# Patient Record
Sex: Female | Born: 1955
Health system: Southern US, Community
[De-identification: ages and names within clinical notes are randomized; demographics above are authoritative.]

## PROBLEM LIST (undated history)

## (undated) DIAGNOSIS — K5790 Diverticulosis of intestine, part unspecified, without perforation or abscess without bleeding: Secondary | ICD-10-CM

## (undated) DIAGNOSIS — G709 Myoneural disorder, unspecified: Secondary | ICD-10-CM

## (undated) DIAGNOSIS — N189 Chronic kidney disease, unspecified: Secondary | ICD-10-CM

## (undated) DIAGNOSIS — M1990 Unspecified osteoarthritis, unspecified site: Secondary | ICD-10-CM

## (undated) DIAGNOSIS — K5730 Diverticulosis of large intestine without perforation or abscess without bleeding: Secondary | ICD-10-CM

## (undated) DIAGNOSIS — G35 Multiple sclerosis: Secondary | ICD-10-CM

## (undated) DIAGNOSIS — D649 Anemia, unspecified: Secondary | ICD-10-CM

## (undated) DIAGNOSIS — N319 Neuromuscular dysfunction of bladder, unspecified: Secondary | ICD-10-CM

## (undated) DIAGNOSIS — I10 Essential (primary) hypertension: Secondary | ICD-10-CM

## (undated) DIAGNOSIS — N390 Urinary tract infection, site not specified: Secondary | ICD-10-CM

## (undated) DIAGNOSIS — I8390 Asymptomatic varicose veins of unspecified lower extremity: Secondary | ICD-10-CM

## (undated) DIAGNOSIS — K921 Melena: Secondary | ICD-10-CM

## (undated) DIAGNOSIS — Z87442 Personal history of urinary calculi: Secondary | ICD-10-CM

## (undated) DIAGNOSIS — K219 Gastro-esophageal reflux disease without esophagitis: Secondary | ICD-10-CM

## (undated) DIAGNOSIS — K5792 Diverticulitis of intestine, part unspecified, without perforation or abscess without bleeding: Secondary | ICD-10-CM

## (undated) DIAGNOSIS — I8393 Asymptomatic varicose veins of bilateral lower extremities: Secondary | ICD-10-CM

## (undated) DIAGNOSIS — F329 Major depressive disorder, single episode, unspecified: Secondary | ICD-10-CM

## (undated) DIAGNOSIS — K319 Disease of stomach and duodenum, unspecified: Secondary | ICD-10-CM

## (undated) DIAGNOSIS — J310 Chronic rhinitis: Secondary | ICD-10-CM

## (undated) DIAGNOSIS — I809 Phlebitis and thrombophlebitis of unspecified site: Secondary | ICD-10-CM

## (undated) DIAGNOSIS — N3941 Urge incontinence: Secondary | ICD-10-CM

## (undated) DIAGNOSIS — Z973 Presence of spectacles and contact lenses: Secondary | ICD-10-CM

## (undated) DIAGNOSIS — F39 Unspecified mood [affective] disorder: Secondary | ICD-10-CM

## (undated) HISTORY — DX: Chronic rhinitis: J31.0

## (undated) HISTORY — DX: Diverticulitis of intestine, part unspecified, without perforation or abscess without bleeding: K57.92

## (undated) HISTORY — DX: Asymptomatic varicose veins of unspecified lower extremity: I83.90

## (undated) HISTORY — DX: Phlebitis and thrombophlebitis of unspecified site: I80.9

## (undated) HISTORY — DX: Disease of stomach and duodenum, unspecified: K31.9

## (undated) HISTORY — DX: Unspecified mood (affective) disorder: F39

## (undated) HISTORY — DX: Melena: K92.1

## (undated) HISTORY — DX: Diverticulosis of intestine, part unspecified, without perforation or abscess without bleeding: K57.90

## (undated) HISTORY — DX: Anemia, unspecified: D64.9

## (undated) HISTORY — PX: TONSILLECTOMY AND ADENOIDECTOMY: SUR1326

## (undated) HISTORY — DX: Major depressive disorder, single episode, unspecified: F32.9

## (undated) HISTORY — PX: CATARACT EXTRACTION: SUR2

## (undated) HISTORY — DX: Multiple sclerosis: G35

## (undated) HISTORY — DX: Gastro-esophageal reflux disease without esophagitis: K21.9

---
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
## 1999-07-18 ENCOUNTER — Other Ambulatory Visit: Admission: RE | Admit: 1999-07-18 | Discharge: 1999-07-18 | Payer: Self-pay | Admitting: *Deleted

## 2002-01-26 ENCOUNTER — Encounter: Payer: Self-pay | Admitting: Family Medicine

## 2002-01-26 ENCOUNTER — Ambulatory Visit (HOSPITAL_COMMUNITY): Admission: RE | Admit: 2002-01-26 | Discharge: 2002-01-26 | Payer: Self-pay | Admitting: Family Medicine

## 2003-09-07 ENCOUNTER — Encounter: Payer: Self-pay | Admitting: Internal Medicine

## 2004-01-23 ENCOUNTER — Ambulatory Visit (HOSPITAL_COMMUNITY): Admission: RE | Admit: 2004-01-23 | Discharge: 2004-01-23 | Payer: Self-pay | Admitting: Obstetrics and Gynecology

## 2004-11-04 ENCOUNTER — Ambulatory Visit: Payer: Self-pay | Admitting: Internal Medicine

## 2004-12-16 ENCOUNTER — Ambulatory Visit: Payer: Self-pay | Admitting: Internal Medicine

## 2005-01-10 ENCOUNTER — Ambulatory Visit: Payer: Self-pay | Admitting: Internal Medicine

## 2005-01-16 ENCOUNTER — Other Ambulatory Visit: Admission: RE | Admit: 2005-01-16 | Discharge: 2005-01-16 | Payer: Self-pay | Admitting: Internal Medicine

## 2005-01-16 ENCOUNTER — Ambulatory Visit: Payer: Self-pay | Admitting: Internal Medicine

## 2005-01-27 ENCOUNTER — Ambulatory Visit (HOSPITAL_COMMUNITY): Admission: RE | Admit: 2005-01-27 | Discharge: 2005-01-27 | Payer: Self-pay | Admitting: Obstetrics and Gynecology

## 2005-02-03 ENCOUNTER — Ambulatory Visit: Payer: Self-pay | Admitting: Internal Medicine

## 2005-02-17 ENCOUNTER — Ambulatory Visit: Payer: Self-pay | Admitting: Internal Medicine

## 2005-02-17 ENCOUNTER — Encounter (INDEPENDENT_AMBULATORY_CARE_PROVIDER_SITE_OTHER): Payer: Self-pay | Admitting: *Deleted

## 2005-04-04 ENCOUNTER — Ambulatory Visit: Payer: Self-pay | Admitting: Internal Medicine

## 2005-07-28 DIAGNOSIS — G35 Multiple sclerosis: Secondary | ICD-10-CM

## 2005-07-28 HISTORY — DX: Multiple sclerosis: G35

## 2005-09-18 ENCOUNTER — Ambulatory Visit: Payer: Self-pay | Admitting: Internal Medicine

## 2006-02-04 ENCOUNTER — Ambulatory Visit: Payer: Self-pay | Admitting: Internal Medicine

## 2006-03-05 ENCOUNTER — Ambulatory Visit: Payer: Self-pay | Admitting: Internal Medicine

## 2006-04-28 ENCOUNTER — Encounter: Admission: RE | Admit: 2006-04-28 | Discharge: 2006-04-28 | Payer: Self-pay | Admitting: Obstetrics and Gynecology

## 2006-05-28 ENCOUNTER — Ambulatory Visit: Payer: Self-pay | Admitting: Internal Medicine

## 2006-07-16 ENCOUNTER — Encounter: Payer: Self-pay | Admitting: Internal Medicine

## 2006-07-28 HISTORY — PX: ABDOMINAL HYSTERECTOMY: SUR658

## 2006-07-28 HISTORY — PX: BLADDER SURGERY: SHX569

## 2006-08-07 ENCOUNTER — Ambulatory Visit: Payer: Self-pay | Admitting: Internal Medicine

## 2006-08-14 ENCOUNTER — Ambulatory Visit (HOSPITAL_COMMUNITY): Admission: RE | Admit: 2006-08-14 | Discharge: 2006-08-14 | Payer: Self-pay | Admitting: Neurology

## 2006-08-25 ENCOUNTER — Encounter: Payer: Self-pay | Admitting: Internal Medicine

## 2006-12-01 ENCOUNTER — Encounter (INDEPENDENT_AMBULATORY_CARE_PROVIDER_SITE_OTHER): Payer: Self-pay | Admitting: Specialist

## 2006-12-01 HISTORY — PX: TOTAL VAGINAL HYSTERECTOMY: SHX2548

## 2006-12-02 ENCOUNTER — Inpatient Hospital Stay (HOSPITAL_COMMUNITY): Admission: RE | Admit: 2006-12-02 | Discharge: 2006-12-03 | Payer: Self-pay | Admitting: Obstetrics and Gynecology

## 2007-02-24 ENCOUNTER — Telehealth: Payer: Self-pay | Admitting: Internal Medicine

## 2007-02-26 DIAGNOSIS — N3000 Acute cystitis without hematuria: Secondary | ICD-10-CM | POA: Insufficient documentation

## 2007-03-11 ENCOUNTER — Ambulatory Visit: Payer: Self-pay | Admitting: Family Medicine

## 2007-03-11 ENCOUNTER — Telehealth: Payer: Self-pay | Admitting: Internal Medicine

## 2007-03-16 ENCOUNTER — Telehealth: Payer: Self-pay | Admitting: Internal Medicine

## 2007-04-01 ENCOUNTER — Encounter: Payer: Self-pay | Admitting: Internal Medicine

## 2007-04-30 ENCOUNTER — Ambulatory Visit: Payer: Self-pay | Admitting: Internal Medicine

## 2007-04-30 DIAGNOSIS — G35 Multiple sclerosis: Secondary | ICD-10-CM | POA: Insufficient documentation

## 2007-04-30 DIAGNOSIS — F4321 Adjustment disorder with depressed mood: Secondary | ICD-10-CM | POA: Insufficient documentation

## 2007-04-30 DIAGNOSIS — J309 Allergic rhinitis, unspecified: Secondary | ICD-10-CM | POA: Insufficient documentation

## 2007-04-30 DIAGNOSIS — I10 Essential (primary) hypertension: Secondary | ICD-10-CM | POA: Insufficient documentation

## 2007-04-30 DIAGNOSIS — I8310 Varicose veins of unspecified lower extremity with inflammation: Secondary | ICD-10-CM | POA: Insufficient documentation

## 2007-05-31 ENCOUNTER — Encounter: Payer: Self-pay | Admitting: Internal Medicine

## 2007-07-05 ENCOUNTER — Ambulatory Visit: Payer: Self-pay | Admitting: Internal Medicine

## 2007-07-05 DIAGNOSIS — J069 Acute upper respiratory infection, unspecified: Secondary | ICD-10-CM | POA: Insufficient documentation

## 2007-07-05 DIAGNOSIS — J019 Acute sinusitis, unspecified: Secondary | ICD-10-CM | POA: Insufficient documentation

## 2007-08-11 ENCOUNTER — Encounter: Payer: Self-pay | Admitting: Internal Medicine

## 2007-09-01 ENCOUNTER — Telehealth: Payer: Self-pay | Admitting: Internal Medicine

## 2007-09-28 ENCOUNTER — Encounter: Payer: Self-pay | Admitting: Internal Medicine

## 2007-09-29 ENCOUNTER — Telehealth: Payer: Self-pay | Admitting: Internal Medicine

## 2007-11-04 ENCOUNTER — Encounter: Payer: Self-pay | Admitting: Internal Medicine

## 2007-11-11 ENCOUNTER — Encounter
Admission: RE | Admit: 2007-11-11 | Discharge: 2008-02-09 | Payer: Self-pay | Admitting: Physical Medicine & Rehabilitation

## 2007-12-03 ENCOUNTER — Ambulatory Visit: Payer: Self-pay | Admitting: Physical Medicine & Rehabilitation

## 2007-12-27 ENCOUNTER — Encounter: Admission: RE | Admit: 2007-12-27 | Discharge: 2007-12-27 | Payer: Self-pay | Admitting: Anesthesiology

## 2007-12-31 ENCOUNTER — Ambulatory Visit: Payer: Self-pay | Admitting: Physical Medicine & Rehabilitation

## 2008-01-25 ENCOUNTER — Encounter: Payer: Self-pay | Admitting: Internal Medicine

## 2008-01-25 ENCOUNTER — Telehealth: Payer: Self-pay | Admitting: *Deleted

## 2008-02-23 ENCOUNTER — Encounter
Admission: RE | Admit: 2008-02-23 | Discharge: 2008-02-24 | Payer: Self-pay | Admitting: Physical Medicine & Rehabilitation

## 2008-02-24 ENCOUNTER — Ambulatory Visit: Payer: Self-pay | Admitting: Physical Medicine & Rehabilitation

## 2008-04-11 ENCOUNTER — Encounter: Payer: Self-pay | Admitting: Internal Medicine

## 2008-07-03 ENCOUNTER — Telehealth: Payer: Self-pay | Admitting: *Deleted

## 2008-08-02 ENCOUNTER — Telehealth: Payer: Self-pay | Admitting: *Deleted

## 2008-08-22 ENCOUNTER — Ambulatory Visit: Payer: Self-pay | Admitting: Internal Medicine

## 2008-08-22 DIAGNOSIS — M79609 Pain in unspecified limb: Secondary | ICD-10-CM | POA: Insufficient documentation

## 2008-09-19 ENCOUNTER — Telehealth: Payer: Self-pay | Admitting: *Deleted

## 2008-09-22 ENCOUNTER — Ambulatory Visit: Payer: Self-pay | Admitting: Family Medicine

## 2008-10-06 ENCOUNTER — Encounter: Payer: Self-pay | Admitting: Internal Medicine

## 2008-10-06 LAB — CONVERTED CEMR LAB: Vit D, 25-Hydroxy: 19 ng/mL

## 2008-10-09 ENCOUNTER — Encounter: Payer: Self-pay | Admitting: Internal Medicine

## 2008-10-09 ENCOUNTER — Telehealth: Payer: Self-pay | Admitting: Internal Medicine

## 2008-11-02 ENCOUNTER — Telehealth: Payer: Self-pay | Admitting: Internal Medicine

## 2008-11-02 ENCOUNTER — Encounter: Payer: Self-pay | Admitting: Internal Medicine

## 2008-11-13 ENCOUNTER — Telehealth: Payer: Self-pay | Admitting: *Deleted

## 2008-11-15 ENCOUNTER — Telehealth: Payer: Self-pay | Admitting: *Deleted

## 2008-11-30 ENCOUNTER — Emergency Department (HOSPITAL_COMMUNITY): Admission: EM | Admit: 2008-11-30 | Discharge: 2008-11-30 | Payer: Self-pay | Admitting: Emergency Medicine

## 2008-11-30 ENCOUNTER — Encounter: Payer: Self-pay | Admitting: *Deleted

## 2008-11-30 LAB — CONVERTED CEMR LAB
Hemoglobin: 12.4 g/dL
Platelets: 235 10*3/uL
WBC: 9.5 10*3/uL

## 2008-12-13 ENCOUNTER — Ambulatory Visit: Payer: Self-pay | Admitting: Internal Medicine

## 2008-12-13 ENCOUNTER — Encounter: Payer: Self-pay | Admitting: Family Medicine

## 2008-12-13 DIAGNOSIS — Z8639 Personal history of other endocrine, nutritional and metabolic disease: Secondary | ICD-10-CM

## 2008-12-13 DIAGNOSIS — R42 Dizziness and giddiness: Secondary | ICD-10-CM | POA: Insufficient documentation

## 2008-12-13 DIAGNOSIS — E559 Vitamin D deficiency, unspecified: Secondary | ICD-10-CM | POA: Insufficient documentation

## 2008-12-13 DIAGNOSIS — T50995A Adverse effect of other drugs, medicaments and biological substances, initial encounter: Secondary | ICD-10-CM | POA: Insufficient documentation

## 2008-12-13 DIAGNOSIS — K5900 Constipation, unspecified: Secondary | ICD-10-CM | POA: Insufficient documentation

## 2008-12-13 DIAGNOSIS — M62838 Other muscle spasm: Secondary | ICD-10-CM | POA: Insufficient documentation

## 2008-12-13 DIAGNOSIS — R110 Nausea: Secondary | ICD-10-CM | POA: Insufficient documentation

## 2008-12-13 DIAGNOSIS — Z862 Personal history of diseases of the blood and blood-forming organs and certain disorders involving the immune mechanism: Secondary | ICD-10-CM | POA: Insufficient documentation

## 2008-12-13 LAB — CONVERTED CEMR LAB
Alkaline Phosphatase: 71 units/L (ref 39–117)
Bilirubin, Direct: 0.2 mg/dL (ref 0.0–0.3)
CO2: 32 meq/L (ref 19–32)
Calcium: 9.4 mg/dL (ref 8.4–10.5)
GFR calc non Af Amer: 110.46 mL/min (ref >60)
Sodium: 144 meq/L (ref 135–145)
Total Bilirubin: 0.6 mg/dL (ref 0.3–1.2)

## 2008-12-15 ENCOUNTER — Encounter: Payer: Self-pay | Admitting: *Deleted

## 2008-12-18 ENCOUNTER — Telehealth: Payer: Self-pay | Admitting: *Deleted

## 2008-12-20 ENCOUNTER — Telehealth: Payer: Self-pay | Admitting: Internal Medicine

## 2009-01-30 ENCOUNTER — Encounter: Payer: Self-pay | Admitting: Internal Medicine

## 2009-02-06 ENCOUNTER — Encounter: Payer: Self-pay | Admitting: *Deleted

## 2009-03-13 ENCOUNTER — Ambulatory Visit: Payer: Self-pay | Admitting: Internal Medicine

## 2009-03-13 DIAGNOSIS — N390 Urinary tract infection, site not specified: Secondary | ICD-10-CM | POA: Insufficient documentation

## 2009-03-13 DIAGNOSIS — R1084 Generalized abdominal pain: Secondary | ICD-10-CM | POA: Insufficient documentation

## 2009-03-13 LAB — CONVERTED CEMR LAB
Glucose, Urine, Semiquant: NEGATIVE
Nitrite: NEGATIVE
Specific Gravity, Urine: <1.005
pH: 6.5

## 2009-03-14 ENCOUNTER — Encounter: Payer: Self-pay | Admitting: Internal Medicine

## 2009-08-20 ENCOUNTER — Encounter: Payer: Self-pay | Admitting: Internal Medicine

## 2010-05-07 ENCOUNTER — Encounter: Payer: Self-pay | Admitting: Internal Medicine

## 2010-05-08 ENCOUNTER — Encounter: Payer: Self-pay | Admitting: *Deleted

## 2010-08-17 ENCOUNTER — Encounter: Payer: Self-pay | Admitting: Obstetrics and Gynecology

## 2010-08-17 ENCOUNTER — Encounter: Payer: Self-pay | Admitting: Internal Medicine

## 2010-08-27 NOTE — Miscellaneous (Signed)
Summary: Flu shot/[facility]  Flu shot/[facility]   Imported By: Maryln Gottron 05/14/2010 13:05:53  _____________________________________________________________________  External Attachment:    Type:   Image     Comment:   External Document

## 2010-08-27 NOTE — Miscellaneous (Signed)
Summary: Flu Vaccination/[facility]  Flu Vaccination/[facility]   Imported By: Maryln Gottron 08/29/2009 11:24:14  _____________________________________________________________________  External Attachment:    Type:   Image     Comment:   External Document

## 2010-08-27 NOTE — Miscellaneous (Signed)
Summary: Immunization Entry   Immunization History:  Influenza Immunization History:    Influenza:  fluvax 3+ (05/07/2010) 

## 2010-11-05 LAB — BASIC METABOLIC PANEL
CO2: 21 mEq/L (ref 19–32)
Chloride: 104 mEq/L (ref 96–112)
Creatinine, Ser: 0.56 mg/dL (ref 0.4–1.2)
GFR calc Af Amer: >60 mL/min (ref >60)
Glucose, Bld: 166 mg/dL — ABNORMAL HIGH (ref 70–99)

## 2010-11-05 LAB — CBC
MCHC: 36.2 g/dL — ABNORMAL HIGH (ref 30.0–36.0)
MCV: 87.9 fL (ref 78.0–100.0)
RBC: 3.92 MIL/uL (ref 3.87–5.11)
RDW: 12.9 % (ref 11.5–15.5)

## 2010-12-10 NOTE — Group Therapy Note (Signed)
REASON FOR CONSULTATION:  A consultation is requested for the evaluation  of pain, in the setting of multiple sclerosis.   HISTORY:  This 55 year old female who states she was diagnosed with  multiple sclerosis approximately 1-1/2 years ago, initially in a  relapsing remitting pattern, then later to a secondary progressive  pattern.  She thinks she may have had some symptomatology even as far  back as ten years ago.  In terms of her functional status, she is  independent without self-care and mobility. 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  She remains quite active.  She works at the youth camp four days a week.  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  She walks with a cane and has been using a cane for the last  year or so.   In terms of her pain complaints, she has several areas.  The most  intense are over her hip areas bilaterally.  This wakes her up at night.  She has a calf tightness as well and this was partially relieved with  Baclofen.  She also complains of back pain which is intermittent.  The  neck pain is constant but not severe.  She states that her pain averages  in the 4/10 range and currently 2/10.  It interferes with activity at a  1/10 level.  The pain is worse during the evening hours, and this more  specifically relates to her hip pain.  Her sleep is poor because of  this.  Her pain is worse with activity and improved with rest, heat,  therapy and medications.  She has just recently gone through physical  therapy and has learned some stretching exercises, which have been quite  helpful.  Her neck and back pain seem to be relieved with flexion-type  exercise.   REVIEW OF SYSTEMS:  Significant for numbness, tremor, tingling in the  legs, greater than the arms, trouble walking, spasms, depression and  anxiety.  She has a goal of coming off some of her  antidepressant  medications.  In regards to her MS, she is starting Tysabri in two  weeks.  She has come off the Copaxone.   FAMILY HISTORY:  Mother was initially diagnosed with multiple sclerosis,  but then was diagnosed by Dr. Gilmore Laroche as some other problem,  mimicking multiple sclerosis.   SOCIAL HISTORY:  Married with four kids.  The youngest one is going to  [employer].  She is not a smoker.  She drinks wine about five times a  week, but only a glass or so.   PAST MEDICAL HISTORY:  Also significant hypertension.   CURRENT MEDICATIONS:  1. Zoloft 150 mg daily.  2. Wellbutrin 150 mg daily.  3. Baclofen 10 mg twice daily.  4. Astelin spray.  5. As noted, starting Tysabri on Dec 10, 2007.   ALLERGIES:  CODEINE causes itching.  She has seasonal allergies.   PHYSICAL EXAMINATION:  GENERAL:  A well-developed and well-nourished  female, in no acute distress.  Her mood and affect are appropriate.  NEUROLOGIC/MUSCULOSKELETAL:  Gait:  She does appear to have some  stiffness in her left and right hip and a wide base of support.  Her  deep tendon reflexes 3+ in the bilateral biceps, triceps, brachialis  radialis, 2+ in the knees and ankles.  She has no evidence of clonus at  the  ankles.  Her motor strength is 5/5 in the bilateral upper  extremities and lower extremities.  She has tenderness to palpation over  the bilateral greater trochanters.  Muscle bulk is normal.  She has no  evidence of fasciculations, no evidence of peripheral edema.  She has  normal peripheral pulses in the upper and lower extremities.  Has 75%  range of motion.  Flexion, extension, lateral rotation and bending.  Lhermitte's sign is negative.  Spurling's sign is negative.  She has  pain with extension of her cervical, thoracic and lumbar spine.  She has  tenderness to palpation in the cervical, thoracic and lumbar paraspinals  as well as right upper trapezius.  She has no tenderness over the   fibromyalgia tender points, except over the upper trapezius and low back  areas.  This is not diagnostic, however.   Upper and lower extremity range of motion  are normal.  Joint mobility  is normal.   IMPRESSION:  A 55 year old female with multiple sclerosis, manifesting  as truncal ataxia and some poorly-defined weakness.  She may have some  element of spasticity as well.   1. As I discussed with the patient, her multiple sclerosis may not be      the underlying cause of several of her pain complaints; and in      fact, she likely has some additional causes as listed below.  2. Bilateral hip pain, left greater than right.  This appears to be      related to a trochanteric bursitis.  3. Cervical, thoracic and lumbar spine pain with extension.  This most      likely represents a facet syndrome, with some underlying      spondylosis.   PLAN:  1. In regards to her hip pain, would like to inject her left hip.  She      states she does not have enough time to do that today, and I will      schedule her back for this.  Would anticipate using 40 mg of      Kenalog and 4 mL of 1% lidocaine.  2. In terms of her neck and back pain, I think that stretching is      appropriate.  She may benefit from some stabilization exercises and      if this progresses, may be a candidate for cervical facet      injections.  3. For now, we will start tramadol 50 mg twice daily.  Cautioned      against serotonin syndrome in conjunction with her previous      prescription for Zoloft.  4. Given some calf tightness and what seems to be a decreasing effect      in the use of the Baclofen, will ask her to increase it to three      times daily.   FOLLOWUP:  I will see her back for the hip injection and see how she  does on the tramadol.   Thank you for this interesting consultation.  I will keep you appraised  of her progress.      Erick Colace, M.D.  Electronically Signed     AEK/MedQ  D:   12/03/2007 16:31:51  T:  12/03/2007 17:46:56  Job #:  [id number]   cc:   Harriette Bouillon  Fax: [phone]

## 2010-12-10 NOTE — Assessment & Plan Note (Signed)
A 55 year old female diagnosed with multiple sclerosis approximately 1-  1/2 years ago, initially with a relapsing remitting pattern, later to a  secondary progressive pattern.  She continues to be independent with  self care and mobility. 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  Last visit I  increased her baclofen to t.i.d. and initiated tramadol 50 mg b.i.d.  She has been continuous in physical therapy at [facility], doing  some stretching for the neck and upper back area.   ALLERGIES:  CODEINE, CAUSING ITCHING.   Pain diagram indicates pain over the bilateral hip areas as well as mid  back and neck area.  Average pain is 4/10, currently 2, interfering with  activity at a 1/10 level.  She can walk 30 minutes at a time, climb  steps and drive.   REVIEW OF SYSTEMS:  Positive for bladder and bowel problems related to  MS, depression, anxiety.   PHYSICAL EXAMINATION:  Gait has some wide-based support.  She has no  evidence of toe drag or knee instability.  Deep tendon reflexes 3+,  bilateral biceps, triceps, brachioradialis, 2+ knees and ankles.  No  evidence of clonus at the ankle.  She does have some increased  resistance to passive movement, bilateral quadriceps, modified Ashworth  score of 2.  No evidence of fasciculation.  She has no evidence of  peripheral edema.  She has normal pulses in the lower extremities.  She  has no tenderness over the greater trochanters of the hip.   IMPRESSION:  A 55 year old female with multiple sclerosis, secondary  progressive with truncal ataxia and spasticity, primarily in the lower  extremities.   PLAN:  1. We will increase her baclofen to 10 mg q.i.d.  2. Continue tramadol 50 mg b.i.d.  3. Instructed her in quad stretching exercises.  4. Hold off on hip injection as this appears to be better.   I will see her back in about 2 months.      Erick Colace, M.D.  Electronically Signed     AEK/MedQ  D:  12/31/2007 16:02:57  T:  12/31/2007 18:22:12  Job #:  [id number]   cc:   Dr. Flonnie Hailstone [facility] Department of Neurology

## 2010-12-10 NOTE — Assessment & Plan Note (Signed)
A 55 year old female diagnosed with multiple sclerosis approximately 1-  1/2 years ago, initially with relapsing, remitting, then later secondary  progressive pattern.  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  She has had back pain, neck pain, and some thigh pain.  This has been  averaging 3/10.  She was trailed on tramadol 50 mg twice a day which was  not particularly helpful.  She saw Dr. Epimenio Foot and was started on Lodine  400 mg b.i.d., but she increased it to t.i.d. per her report and this  has been helpful.  She has had no GI upset, but she has concerns of  potential GI upset and thought that the Lodine would be safer than the  ibuprofen that she used to take, but we discussed that it has the same  potential.   Her sleep is fair.  Her pain interferes with activity only a little bit.  Her pain is described as burning, dull, and aching, improves with ice,  stretching, and she has been doing some water aerobic exercise and MS  specific program at the [facility] [place].   Her pain does interfere with meal prep, household duties, and shopping.  She has applied for disability since retiring from work.  She has had  problems with tingling, trouble walking, spasms, dizziness, confusion,  depression, nausea, and constipation.  She has had her third dose of  Tysabri and thinks may be this has helped her.  She was in bed for a  couple of weeks after quitting work.   SOCIAL HISTORY:  Married.   PHYSICAL EXAMINATION:  VITAL SIGNS:  Blood pressure is 141/78, pulse 77,  respirations 22, and O2 sat 97% on room air.  GENERAL:  Well-developed and well-nourished female, orientation x3.  Affect is alert.  NEUROLOGIC:  Gait is wide based.  She is able to toe-walk and heel-walk,  although she has poor balance with heel-walk.  Her Romberg is negative.  Finger-nose-finger testing is intact.  She has no evidence of  clonus,  but has 3+ reflexes bilateral at biceps, triceps, brachioradialis, knee  and ankle.  She has positive Hoffman reflex.  She has no tenderness to  palpation with greater trochanters of the hip, but she has some  tenderness on the lumbosacral junction.  Her motor strength is 5/5  bilateral deltoid, biceps, triceps, grip, as well as hip flexion, knee  extension, and ankle dorsiflexion.   IMPRESSION:  A 55 year old female with multiple sclerosis secondary to  progressive truncal ataxia, spasticity, as well as diffuse pains i.e.  low back, neck, and legs which are likely multifactorial in nature.   PLAN:  1. We will continue baclofen 10 mg t.i.d.  2. Given her concerns regarding peptic ulcer disease.  We will first      trial her on tramadol 100 mg b.i.d., and if this fails to provide      adequate relief, we will trial her on Celebrex 200 mg a day had      given her some samples.  This      is to be taken in place of the Lodine.  3. I will see her back in 2 months or sooner if need be.      [name], M.D.  Electronically Signed     AEK/MedQ  D:  02/24/2008 09:43:40  T:  02/25/2008 02:19:14  Job #:  [id number]   cc:  Trudie Buckler  Fax: [phone]   Despina Arias  Fax: [phone]

## 2010-12-13 NOTE — Op Note (Signed)
NAMEREYANN, TROOP               ACCOUNT NO.:  [id number]   MEDICAL RECORD NO.:  [id number]          PATIENT TYPE:  AMB   LOCATION:  SDC                           FACILITY:  [facility]   PHYSICIAN:  Michelle L. Grewal, M.D.DATE OF BIRTH:  [birth year]   DATE OF PROCEDURE:  12/01/2006  DATE OF DISCHARGE:                               OPERATIVE REPORT   PREOPERATIVE DIAGNOSIS:  Pelvic relaxation.   POSTOPERATIVE DIAGNOSIS:  Pelvic relaxation.   PROCEDURE:  Total vaginal hysterectomy, anterior repair, posterior  repair and sacrospinous ligament fixation of vaginal vault.   SURGEON:  Michelle L. Vincente Poli, M.D.   ASSISTANT:  Dineen Kid. Rana Snare, M.D.   ANESTHESIA:  General.   SPECIMENS:  Uterus and cervix.   ESTIMATED BLOOD LOSS:  200 mL.   COMPLICATIONS:  None.   PROCEDURE:  Patient taken to the operating room after informed consent  was obtained.  She had been counseled and acknowledged the known and  accepted risks of surgery which include risks of anesthesia, risk of  infection, bleeding internal injury to bladder, bowel or other organs  and risk of pulmonary embolism and venous thromboembolism.  The patient  was intubated.  She was prepped and draped.  A Foley catheter was  inserted.  Exam under anesthesia revealed she had grade III uterine  prolapse.  A small cystocele and grade II to III rectocele.  She had  excellent support of her UV angle.  Of note the patient had had a  history of a Burch retropubic urethropexy in 1998.  A weighted speculum  was placed in the vagina.  The cervix was grasped with a tenaculum.  A  circumferential incision was made around the cervix and the posterior  cul-de-sac was entered sharply and the anterior cul-de-sac was entered  sharply.  A weighted speculum placed in the cul-de-sac.  Curved Heaney  clamps were placed and clamped the uterosacral cardinal ligaments on  either side.  The pedicles were cut and suture ligated using 0-0 Vicryl  suture.  We  walked our way up the known broad ligament and each clamp  was just beside the uterus and the pedicle was secured using a free tie  suture ligature of 0-0 Vicryl suture.  Once we reached the level of the  triple pedicle, the uterus was retroflexed.  The remainder of the broad  ligament was clamped on either side using a curved Heaney clamps.  The  specimen was removed.  The triple pedicle was secured using a suture  ligature and a free tie of O Vicryl suture.  The reason inspected and  noted to be normal.  Of note the patient had desired for her ovaries not  to be removed.  They did appear to be normal.  We then proceeded with  our anterior repair by placing Allis clamps on either side and making a  small midline incision in the anterior vaginal vault with Metzenbaum  scissors.  The cystocele was reduced using sharp and blunt dissection.  The cystocele was closed using a pursestring sutures using 0-0 Vicryl  suture.  The redundant vaginal epithelium  was trimmed and the anterior  vaginal vault was closed using 2-0 Vicryl in a continuous running locked  stitch.  At this point we then closed in the part of the vaginal cuff as  well, the anterior two thirds using 2-0 Vicryl.  We then performed a  posterior repair.  Allis clamps were placed at 5 and 7 o'clock.  A V-  shaped incision was made and the midline incision was made all the way  up the posterior vaginal wall.  The rectocele was reduced using sharp  and blunt dissection.  At this point we then identified her sacrospinous  ligament on her right and using the Cassio device a stitch was placed  through the sacral spinous ligament with excellent placement.  The  placement was rechecked.  I then suspended the sacrospinous ligament and  used a stitch using a figure-of-eight stitch to the vaginal apex on the  underside.  We then reduced the rectocele using interrupteds using 0-0  Vicryl suture.  We then started to close the vaginal epithelium  after  redundant tissue was trimmed using 2-0 Vicryl in continuous running  locked stitch.  After about two thirds of the vaginal posterior vaginal  wall was closed, I then tied down the sacrospinous ligament, had  excellent fixation of the vaginal vault and with excellent suspension.  The remainder of the vaginal epithelium was closed.  The perineum was  reapproximated reinforced using 0-0 Vicryl suture and the rest of the  vagina was then closed using subcuticular stitch.  At the end of the  procedure no bleeding was noted.  A vaginal packing with Estrace cream  was placed in the vagina.  All sponge, lap and instrument counts were  correct x2.  Urine was clear.  The patient was extubated and went to  recovery room in stable condition.  After observation she will go  upstairs for her postoperative care.      Michelle L. Vincente Poli, M.D.     Florestine Avers  D:  12/01/2006  T:  12/01/2006  Job:  [id number]

## 2010-12-13 NOTE — Discharge Summary (Signed)
[name], [name]               ACCOUNT NO.:  [id number]   MEDICAL RECORD NO.:  [id number]          PATIENT TYPE:  INP   LOCATION:  9302                          FACILITY:  [facility]   PHYSICIAN:  Michelle L. Grewal, M.D.DATE OF BIRTH:  September 11, 1955   DATE OF ADMISSION:  12/01/2006  DATE OF DISCHARGE:  12/03/2006                               DISCHARGE SUMMARY   ADMISSION DIAGNOSIS:  Pelvic relaxation.   DISCHARGE DIAGNOSIS:  Pelvic relaxation.   HOSPITAL COURSE:  A 55 year old, G4, P4, with pelvic prolapse.  The  patient underwent TVH, anterior repair, and sacrospinous ligament  fixation.  EBL at the time of surgery was 200 ml.  The patient did very  well postop.  By postop day #2, she was ambulating, voiding, tolerating  her pain medicine, and tolerating a regular diet.  She remained afebrile  with stable vital signs throughout her hospitalization.  Her postop day  #1 hemoglobin was 10.5.  The patient was discharged home in good  condition on postop day #2.  She was given ibuprofen 600 mg every 6  hours to take as needed for pain, Tylox 1 to 2 every 4 to 6 as needed  for pain.  She was advised no driving for one week, no sex for six  weeks, no heavy lifting for four to six weeks.  She was advised to call  if she has any nausea or vomiting, severe abdominal pain, heavy vaginal  bleeding, or a temperature greater than 100.5.  She will follow up with  me in one week.      Michelle L. Vincente Poli, M.D.  Electronically Signed     MLG/MEDQ  D:  01/16/2007  T:  01/16/2007  Job:  [id number]

## 2011-01-21 ENCOUNTER — Other Ambulatory Visit: Payer: Self-pay

## 2011-01-24 ENCOUNTER — Other Ambulatory Visit (INDEPENDENT_AMBULATORY_CARE_PROVIDER_SITE_OTHER): Payer: Self-pay

## 2011-01-24 DIAGNOSIS — Z0000 Encounter for general adult medical examination without abnormal findings: Secondary | ICD-10-CM

## 2011-01-24 LAB — TSH: TSH: 0.97 u[IU]/mL (ref 0.35–5.50)

## 2011-01-24 LAB — LIPID PANEL
Cholesterol: 207 mg/dL — ABNORMAL HIGH (ref 0–200)
HDL: 58.3 mg/dL (ref >39.00)
VLDL: 34.8 mg/dL (ref 0.0–40.0)

## 2011-01-24 LAB — BASIC METABOLIC PANEL
BUN: 15 mg/dL (ref 6–23)
Calcium: 9.3 mg/dL (ref 8.4–10.5)
GFR: 121.19 mL/min (ref >60.00)
Glucose, Bld: 80 mg/dL (ref 70–99)
Sodium: 139 mEq/L (ref 135–145)

## 2011-01-24 LAB — CBC WITH DIFFERENTIAL/PLATELET
Basophils Absolute: 0 10*3/uL (ref 0.0–0.1)
Hemoglobin: 12.5 g/dL (ref 12.0–15.0)
Lymphocytes Relative: 42.3 % (ref 12.0–46.0)
Monocytes Relative: 8.4 % (ref 3.0–12.0)
Platelets: 237 10*3/uL (ref 150.0–400.0)
RDW: 13.7 % (ref 11.5–14.6)

## 2011-01-24 LAB — HEPATIC FUNCTION PANEL
Albumin: 4.4 g/dL (ref 3.5–5.2)
Alkaline Phosphatase: 79 U/L (ref 39–117)

## 2011-01-24 LAB — LDL CHOLESTEROL, DIRECT: Direct LDL: 137.1 mg/dL

## 2011-02-04 ENCOUNTER — Encounter: Payer: Self-pay | Admitting: Internal Medicine

## 2011-02-05 ENCOUNTER — Encounter: Payer: Self-pay | Admitting: Internal Medicine

## 2011-02-05 ENCOUNTER — Ambulatory Visit (INDEPENDENT_AMBULATORY_CARE_PROVIDER_SITE_OTHER): Payer: BC Managed Care – PPO | Admitting: Internal Medicine

## 2011-02-05 VITALS — BP 126/82 | HR 84 | Temp 98.5°F | Ht 66.0 in | Wt 175.0 lb

## 2011-02-05 DIAGNOSIS — F4321 Adjustment disorder with depressed mood: Secondary | ICD-10-CM

## 2011-02-05 DIAGNOSIS — I10 Essential (primary) hypertension: Secondary | ICD-10-CM

## 2011-02-05 DIAGNOSIS — E781 Pure hyperglyceridemia: Secondary | ICD-10-CM

## 2011-02-05 DIAGNOSIS — Z0000 Encounter for general adult medical examination without abnormal findings: Secondary | ICD-10-CM

## 2011-02-05 DIAGNOSIS — G35 Multiple sclerosis: Secondary | ICD-10-CM

## 2011-02-05 DIAGNOSIS — Z23 Encounter for immunization: Secondary | ICD-10-CM

## 2011-02-05 LAB — POCT URINALYSIS DIPSTICK
Bilirubin, UA: NEGATIVE
Glucose, UA: NEGATIVE
Ketones, UA: NEGATIVE
Nitrite, UA: NEGATIVE

## 2011-02-05 NOTE — Progress Notes (Signed)
  Subjective:    Patient ID: [name], female    DOB: Jul 28, 1956, 55 y.o.   MRN: [id number]  HPI Patient comes in today for Preventive Health Care visit   And other issues. Since last visit has had no major change in her health but her MS is progressing some. Had nausea and weakness for about 10 weeks  Had trigeminal nerve and tegretol caused sx. Now better.  Limited walking far  . Marland Kitchen   No fall for a while. Not using cane .  Had some sciatica helped by chiro. Also may have some DJD.  Review of Systems ROS:  GEN/ HEENTNo fever, significant weight changes sweats headaches vision problems hearing changes, CV/ PULM; No chest pain shortness of breath cough, syncope,edema  change in exercise tolerance. GI /GU: No adominal pain, vomiting, change in bowel habits. No blood in the stool. Some urge incontinence. From MS SKIN/HEME: ,no acute skin rashes suspicious lesions or bleeding. No lymphadenopathy, nodules, masses.  NEURO/ PSYCH:  Ms with  Some weakness and numbness.  No depression anxiety. No longer on meds  Except low dose wellbutrin  Family .moving  IMM/ Allergy: No unusual infections.  Allergy .   REST of 12 system review negative  Rest as per hpi  Past history family history social history reviewed in the electronic medical record.     Objective:   Physical Exam Physical Exam: Vital signs reviewed WUJ:[id number] is a well-developed well-nourished alert cooperative  white female who appears her stated age in no acute distress.  HEENT: normocephalic  traumatic , Eyes: PERRL EOM's full, conjunctiva clear, Nares: paten,t no deformity discharge or tenderness., Ears: no deformity EAC's clear TMs with normal landmarks. Mouth: clear OP, no lesions, edema.  Moist mucous membranes. Dentition in adequate repair. NECK: supple without masses, thyromegaly or bruits. CHEST/PULM:  Clear to auscultation and percussion breath sounds equal no wheeze , rales or rhonchi. No chest wall deformities or  tenderness. CV: PMI is nondisplaced, S1 S2 no gallops, murmurs, rubs. Peripheral pulses are full without delay.No JVD .  Breast: normal by inspection . No dimpling, discharge, masses, tenderness or discharge . LN: no cervical axillary inguinal adenopathy ABDOMEN: Bowel sounds normal nontender  No guard or rebound, no hepato splenomegal no CVA tenderness.  No hernia. Extremtities:  No clubbing cyanosis or edema, no acute joint swelling or redness no focal atrophy NEURO:  Oriented x3, cranial nerves 3-12 appear to be intact,   dtrs present no clonus  SKIN: No acute rashes normal turgor, color, no bruising or petechiae. PSYCH: Oriented, good eye contact, no obvious depression anxiety, cognition and judgment appear normal. Labs reviewed  EKG no changes sinus  rhythm    Assessment & Plan:  Preventive Health Care Counseled regarding healthy nutrition, exercise, sleep, injury prevention, calcium vit d and healthy weight . tdap today  MS  Some progression but still quit independent  Good days and bad days no falling.  Disc the clonopin with  Neuro about continuation as concern about dependency.  Mood  doing well   Just on Wellbutrin  HT stable Elevated TG.   Counseled.

## 2011-02-05 NOTE — Patient Instructions (Signed)
Avoid animal fats , trans fats simple sugars sweets  and carbohydrates.  Check up in a year or as needed Disc clonipen with your neurologist.  Hypertriglyceridemia  Diet for High blood levels of Triglycerides Most fats in food are triglycerides. Triglycerides in your blood are stored as fat in your body. High levels of triglycerides in your blood may put you at a greater risk for heart disease and stroke.  Normal triglyceride levels are less than 150 mg/dL. Borderline high levels are 150-199 mg/dl. High levels are 200 - 499 mg/dL, and very high triglyceride levels are greater than 500 mg/dL. The decision to treat high triglycerides is generally based on the level. For people with borderline or high triglyceride levels, treatment includes weight loss and exercise. Drugs are recommended for people with very high triglyceride levels. Many people who need treatment for high triglyceride levels have metabolic syndrome. This syndrome is a collection of disorders that often include: insulin resistance, high blood pressure, blood clotting problems, high cholesterol and triglycerides. TESTING PROCEDURE FOR TRIGLYCERIDES  You should not eat 4 hours before getting your triglycerides measured. The normal range of triglycerides is between 10 and 250 milligrams per deciliter (mg/dl). Some people may have extreme levels (1000 or above), but your triglyceride level may be too high if it is above 150 mg/dl, depending on what other risk factors you have for heart disease.   People with high blood triglycerides may also have high blood cholesterol levels. If you have high blood cholesterol as well as high blood triglycerides, your risk for heart disease is probably greater than if you only had high triglycerides. High blood cholesterol is one of the main risk factors for heart disease.  CHANGING YOUR DIET  Your weight can affect your blood triglyceride level. If you are more than 20% above your ideal body weight, you  may be able to lower your blood triglycerides by losing weight. Eating less and exercising regularly is the best way to combat this. Fat provides more calories than any other food. The best way to lose weight is to eat less fat. Only 30% of your total calories should come from fat. Less than 7% of your diet should come from saturated fat. A diet low in fat and saturated fat is the same as a diet to decrease blood cholesterol. By eating a diet lower in fat, you may lose weight, lower your blood cholesterol, and lower your blood triglyceride level.  Eating a diet low in fat, especially saturated fat, may also help you lower your blood triglyceride level. Ask your dietitian to help you figure how much fat you can eat based on the number of calories your caregiver has prescribed for you.  Exercise, in addition to helping with weight loss may also help lower triglyceride levels.   Alcohol can increase blood triglycerides. You may need to stop drinking alcoholic beverages.   Too much carbohydrate in your diet may also increase your blood triglycerides. Some complex carbohydrates are necessary in your diet. These may include bread, rice, potatoes, other starchy vegetables and cereals.   Reduce "simple" carbohydrates. These may include pure sugars, candy, honey, and jelly without losing other nutrients. If you have the kind of high blood triglycerides that is affected by the amount of carbohydrates in your diet, you will need to eat less sugar and less high-sugar foods. Your caregiver can help you with this.   Adding 2-4 grams of fish oil (EPA+ DHA) may also help lower triglycerides. Speak with  your caregiver before adding any supplements to your regimen.  Following the Diet  Maintain your ideal weight. Your caregivers can help you with a diet. Generally, eating less food and getting more exercise will help you lose weight. Joining a weight control group may also help. Ask your caregivers for a good weight  control group in your area.  Eat low-fat foods instead of high-fat foods. This can help you lose weight too.  These foods are lower in fat. Eat MORE of these:   Dried beans, peas, and lentils.   Egg whites.   Low-fat cottage cheese.   Fish.   Lean cuts of meat, such as round, sirloin, rump, and flank (cut extra fat off meat you fix).   Whole grain breads, cereals and pasta.   Skim and nonfat dry milk.   Low-fat yogurt.   Poultry without the skin.   Cheese made with skim or part-skim milk, such as mozzarella, parmesan, farmers', ricotta, or pot cheese.   These are higher fat foods. Eat LESS of these:   Whole milk and foods made from whole milk, such as American, blue, cheddar, monterey jack, and swiss cheese   High-fat meats, such as luncheon meats, sausages, knockwurst, bratwurst, hot dogs, ribs, corned beef, ground pork, and regular ground beef.   Fried foods.  Limit saturated fats in your diet. Substituting unsaturated fat for saturated fat may decrease your blood triglyceride level. You will need to read package labels to know which products contain saturated fats.  These foods are high in saturated fat. Eat LESS of these:   Fried pork skins.  Whole milk.   Skin and fat from poultry.   Palm oil.   Butter.   Shortening.   Cream cheese.   Tomasa Blase.   Margarines and baked goods made from listed oils.   Vegetable shortenings.   Chitterlings.  Fat from meats.   Coconut oil.   Palm kernel oil.   Lard.   Cream.   Sour cream.   Fatback.   Coffee whiteners and non-dairy creamers made with these oils.   Cheese made from whole milk.   Use unsaturated fats (both polyunsaturated and monounsaturated) moderately. Remember, even though unsaturated fats are better than saturated fats; you still want a diet low in total fat.  These foods are high in unsaturated fat:   Canola oil.  Sunflower oil.   Mayonnaise.   Almonds.   Peanuts.   Pine nuts.    Margarines made with these oils.   Safflower oil.  Olive oil.   Avocados.   Cashews.   Peanut butter.   Sunflower seeds.   Soybean oil.  Peanut oil.   Olives.   Pecans.   Walnuts.   Pumpkin seeds.   Avoid sugar and other high-sugar foods. This will decrease carbohydrates without decreasing other nutrients. Sugar in your food goes rapidly to your blood. When there is excess sugar in your blood, your liver may use it to make more triglycerides. Sugar also contains calories without other important nutrients.  Eat LESS of these:   Sugar, brown sugar, powdered sugar, jam, jelly, preserves, honey, syrup, molasses, pies, candy, cakes, cookies, frosting, pastries, colas, soft drinks, punches, fruit drinks, and regular gelatin.   Avoid alcohol. Alcohol, even more than sugar, may increase blood triglycerides. In addition, alcohol is high in calories and low in nutrients. Ask for sparkling water, or a diet soft drink instead of an alcoholic beverage.  Suggestions for planning and preparing meals   Bake,  broil, grill or roast meats instead of frying.   Remove fat from meats and skin from poultry before cooking.   Add spices, herbs, lemon juice or vinegar to vegetables instead of salt, rich sauces or gravies.   Use a non-stick skillet without fat or use no-stick sprays.   Cool and refrigerate stews and broth. Then remove the hardened fat floating on the surface before serving.   Refrigerate meat drippings and skim off fat to make low-fat gravies.   Serve more fish.   Use less butter, margarine and other high-fat spreads on bread or vegetables.   Use skim or reconstituted non-fat dry milk for cooking.   Cook with low-fat cheeses.   Substitute low-fat yogurt or cottage cheese for all or part of the sour cream in recipes for sauces, dips or congealed salads.   Use half yogurt/half mayonnaise in salad recipes.   Substitute evaporated skim milk for cream. Evaporated skim milk  or reconstituted non-fat dry milk can be whipped and substituted for whipped cream in certain recipes.   Choose fresh fruits for dessert instead of high-fat foods such as pies or cakes. Fruits are naturally low in fat.  When Dining Out   Order low-fat appetizers such as fruit or vegetable juice, pasta with vegetables or tomato sauce.   Select clear, rather than cream soups.   Ask that dressings and gravies be served on the side. Then use less of them.   Order foods that are baked, broiled, poached, steamed, stir-fried, or roasted.   Ask for margarine instead of butter, and use only a small amount.   Drink sparkling water, unsweetened tea or coffee, or diet soft drinks instead of alcohol or other sweet beverages.  QUESTIONS AND ANSWERS ABOUT OTHER FATS IN THE BLOOD:  SATURATED FAT, TRANS FAT, AND CHOLESTEROL What is trans fat? Trans fat is a type of fat that is formed when vegetable oil is hardened through a process called hydrogenation. This process helps makes foods more solid, gives them shape, and prolongs their shelf life. Trans fats are also called hydrogenated or partially hydrogenated oils.  What do saturated fat, trans fat, and cholesterol in foods have to do with heart disease? Saturated fat, trans fat, and cholesterol in the diet all raise the level of LDL "bad" cholesterol in the blood. The higher the LDL cholesterol, the greater the risk for coronary heart disease (CHD). Saturated fat and trans fat raise LDL similarly.  What foods contain saturated fat, trans fat, and cholesterol? High amounts of saturated fat are found in animal products, such as fatty cuts of meat, chicken skin, and full-fat dairy products like butter, whole milk, cream, and cheese, and in tropical vegetable oils such as palm, palm kernel, and coconut oil. Trans fat is found in some of the same foods as saturated fat, such as vegetable shortening, some margarines (especially hard or stick margarine), crackers,  cookies, baked goods, fried foods, salad dressings, and other processed foods made with partially hydrogenated vegetable oils. Small amounts of trans fat also occur naturally in some animal products, such as milk products, beef, and lamb. Foods high in cholesterol include liver, other organ meats, egg yolks, shrimp, and full-fat dairy products. How can I use the new food label to make heart-healthy food choices? Check the Nutrition Facts panel of the food label. Choose foods lower in saturated fat, trans fat, and cholesterol. For saturated fat and cholesterol, you can also use the Percent Daily Value (%DV): 5% DV or less is  low, and 20% DV or more is high. (There is no %DV for trans fat.) Use the Nutrition Facts panel to choose foods low in saturated fat and cholesterol, and if the trans fat is not listed, read the ingredients and limit products that list shortening or hydrogenated or partially hydrogenated vegetable oil, which tend to be high in trans fat. POINTS TO REMEMBER: YOU NEED A LITTLE TLC (THERAPEUTIC LIFESTYLE CHANGES)  Discuss your risk for heart disease with your caregivers, and take steps to reduce risk factors.   Change your diet. Choose foods that are low in saturated fat, trans fat, and cholesterol.   Add exercise to your daily routine if it is not already being done. Participate in physical activity of moderate intensity, like brisk walking, for at least 30 minutes on most, and preferably all days of the week. No time? Break the 30 minutes into three, 10-minute segments during the day.   Stop smoking. If you do smoke, contact your caregiver to discuss ways in which they can help you quit.   Do not use street drugs.   Maintain a normal weight.   Maintain a healthy blood pressure.   Keep up with your blood work for checking the fats in your blood as directed by your caregiver.  Document Released: 05/01/2004 Document Re-Released: 01/01/2010 [facility] Patient Information 2011  [place], [place].

## 2011-02-09 ENCOUNTER — Encounter: Payer: Self-pay | Admitting: Internal Medicine

## 2011-02-09 DIAGNOSIS — E781 Pure hyperglyceridemia: Secondary | ICD-10-CM | POA: Insufficient documentation

## 2011-07-29 DIAGNOSIS — K319 Disease of stomach and duodenum, unspecified: Secondary | ICD-10-CM

## 2011-07-29 HISTORY — DX: Disease of stomach and duodenum, unspecified: K31.9

## 2011-09-24 ENCOUNTER — Ambulatory Visit (INDEPENDENT_AMBULATORY_CARE_PROVIDER_SITE_OTHER): Payer: BC Managed Care – PPO | Admitting: Internal Medicine

## 2011-09-24 ENCOUNTER — Encounter: Payer: Self-pay | Admitting: Internal Medicine

## 2011-09-24 VITALS — BP 120/80 | HR 72 | Temp 98.6°F | Wt 173.0 lb

## 2011-09-24 DIAGNOSIS — R300 Dysuria: Secondary | ICD-10-CM

## 2011-09-24 DIAGNOSIS — G35 Multiple sclerosis: Secondary | ICD-10-CM

## 2011-09-24 DIAGNOSIS — N390 Urinary tract infection, site not specified: Secondary | ICD-10-CM

## 2011-09-24 DIAGNOSIS — IMO0002 Reserved for concepts with insufficient information to code with codable children: Secondary | ICD-10-CM

## 2011-09-24 DIAGNOSIS — N398 Other specified disorders of urinary system: Secondary | ICD-10-CM

## 2011-09-24 LAB — POCT URINALYSIS DIPSTICK
Glucose, UA: NEGATIVE
Nitrite, UA: NEGATIVE
Spec Grav, UA: <=1.005
Urobilinogen, UA: 0.2

## 2011-09-24 MED ORDER — SULFAMETHOXAZOLE-TRIMETHOPRIM 800-160 MG PO TABS: 1.0000 | ORAL_TABLET | Freq: Two times a day (BID) | ORAL | Status: AC

## 2011-09-24 NOTE — Patient Instructions (Addendum)
i agree this is a bladder infection  Take antibiotic  Will call about culture results.  Call if not getting better .  Or as needed or when due. Urinary Tract Infection Infections of the urinary tract can start in several places. A bladder infection (cystitis), a kidney infection (pyelonephritis), and a prostate infection (prostatitis) are different types of urinary tract infections (UTIs). They usually get better if treated with medicines (antibiotics) that kill germs. Take all the medicine until it is gone. You or your child may feel better in a few days, but TAKE ALL MEDICINE or the infection may not respond and may become more difficult to treat. HOME CARE INSTRUCTIONS   Drink enough water and fluids to keep the urine clear or pale yellow. Cranberry juice is especially recommended, in addition to large amounts of water.   Avoid caffeine, tea, and carbonated beverages. They tend to irritate the bladder.   Alcohol may irritate the prostate.   Only take over-the-counter or prescription medicines for pain, discomfort, or fever as directed by your caregiver.  To prevent further infections:  Empty the bladder often. Avoid holding urine for long periods of time.   After a bowel movement, women should cleanse from front to back. Use each tissue only once.   Empty the bladder before and after sexual intercourse.  FINDING OUT THE RESULTS OF YOUR TEST Not all test results are available during your visit. If your or your child's test results are not back during the visit, make an appointment with your caregiver to find out the results. Do not assume everything is normal if you have not heard from your caregiver or the medical facility. It is important for you to follow up on all test results. SEEK MEDICAL CARE IF:   There is back pain.   Your baby is older than 3 months with a rectal temperature of 100.5 F (38.1 C) or higher for more than 1 day.   Your or your child's problems (symptoms) are  no better in 3 days. Return sooner if you or your child is getting worse.  SEEK IMMEDIATE MEDICAL CARE IF:   There is severe back pain or lower abdominal pain.   You or your child develops chills.   You have a fever.   Your baby is older than 3 months with a rectal temperature of 102 F (38.9 C) or higher.   Your baby is 23 months old or younger with a rectal temperature of 100.4 F (38 C) or higher.   There is nausea or vomiting.   There is continued burning or discomfort with urination.  MAKE SURE YOU:   Understand these instructions.   Will watch your condition.   Will get help right away if you are not doing well or get worse.  Document Released: 04/23/2005 Document Revised: 03/26/2011 Document Reviewed: 11/26/2006 [facility] Patient Information 2012 [place], [place].

## 2011-09-24 NOTE — Progress Notes (Signed)
  Subjective:    Patient ID: [name], female    DOB: Oct 30, 1955, 56 y.o.   MRN: [id number]  HPI Patient comes in today for SDA  for acute problem evaluation.  Frequency and small amounts l and now uncomfortable.   For a day  No fever  Achy throat  Weak feeling   No fever chills    February  Felt the same way at times  Last same .  See  Chiro.  This helps her gait and  funcitoning  Last uti was a while back.  MS :  Stable no falling or major progression   Review of Systems NO fever cp sob bleeding new gi changes  UI otherwise has baseline urinary frequency.   Past history family history social history reviewed in the electronic medical record.  Outpatient Prescriptions Prior to Visit  Medication Sig Dispense Refill  . baclofen (LIORESAL) 10 MG tablet Take 10 mg by mouth daily.       . Cholecalciferol (VITAMIN D) 2000 UNITS CAPS Take by mouth.        . diphenhydramine-acetaminophen (TYLENOL PM EXTRA STRENGTH) 25-500 MG TABS Take 1 tablet by mouth at bedtime as needed.        . Melatonin 5 MG CAPS Take by mouth at bedtime as needed.        . natalizumab (TYSABRI) 300 MG/15ML injection Inject into the vein.        Marland Kitchen buPROPion (WELLBUTRIN XL) 300 MG 24 hr tablet Take 300 mg by mouth daily.        . clonazePAM (KLONOPIN) 1 MG tablet Take 2 mg by mouth at bedtime as needed.        . Dalfampridine (AMPYRA) 10 MG TB12 Take by mouth.        . oxyCODONE-acetaminophen (ROXICET) 5-325 MG/5ML solution Take by mouth every 4 (four) hours as needed.        . Psyllium (NATURAL FIBER LAXATIVE PO) Take by mouth. Twice a week       . vitamin E 14782 UNITS external oil Apply topically daily.             Objective:   Physical Exam  WDWN in nad Neck: Supple without adenopathy or masses or bruits Abdomen:  Sof,t normal bowel sounds without hepatosplenomegaly, no guarding rebound or masses no CVA tenderness  Suprapubic area .   Neuro ambulatory no foot drop no edema cn seem intact.       Assessment & Plan:  UTI  Most likely  In pt with MS and some sx of  bladder dysfunction  UC done and add antibiotic  .   Call if not getting better or as needed    Expectant management. And alarm features

## 2011-09-30 NOTE — Progress Notes (Signed)
Quick Note:  Pt aware of results and is feeling. She doesn't want to change antibiotics. ______

## 2011-09-30 NOTE — Progress Notes (Signed)
Quick Note:  Left message to call back. ______ 

## 2011-10-10 ENCOUNTER — Other Ambulatory Visit: Payer: Self-pay | Admitting: Obstetrics and Gynecology

## 2011-10-15 ENCOUNTER — Encounter: Payer: Self-pay | Admitting: Internal Medicine

## 2011-10-15 ENCOUNTER — Ambulatory Visit (INDEPENDENT_AMBULATORY_CARE_PROVIDER_SITE_OTHER): Payer: BC Managed Care – PPO | Admitting: Internal Medicine

## 2011-10-15 VITALS — BP 120/80 | HR 80 | Temp 98.1°F | Wt 173.0 lb

## 2011-10-15 DIAGNOSIS — G35 Multiple sclerosis: Secondary | ICD-10-CM

## 2011-10-15 DIAGNOSIS — K219 Gastro-esophageal reflux disease without esophagitis: Secondary | ICD-10-CM

## 2011-10-15 DIAGNOSIS — R1314 Dysphagia, pharyngoesophageal phase: Secondary | ICD-10-CM | POA: Insufficient documentation

## 2011-10-15 MED ORDER — OMEPRAZOLE 20 MG PO CPDR: 20.0000 mg | DELAYED_RELEASE_CAPSULE | Freq: Every day | ORAL | Status: DC

## 2011-10-15 NOTE — Patient Instructions (Signed)
This problem appears to be related to esophageal dysfunction.  Possible reflux motility problem.  Begin antireflux measures such as small frequent meals and avoiding food right before bedtime.   Begin omeprazole one a day best to take one half hour to an hour before meal. Avoid TUMS for now and use Pepcid a.c. if needed in the meantime.  Plan GI consultation when you get back from [place].  It may be prudent to get some kind of imaging study such as an upper GI or endoscopy as recommended by the gastroenterologist.   Gastroesophageal Reflux Disease, Adult Gastroesophageal reflux disease (GERD) happens when acid from your stomach flows up into the esophagus. When acid comes in contact with the esophagus, the acid causes soreness (inflammation) in the esophagus. Over time, GERD may create small holes (ulcers) in the lining of the esophagus. CAUSES   Increased body weight. This puts pressure on the stomach, making acid rise from the stomach into the esophagus.   Smoking. This increases acid production in the stomach.   Drinking alcohol. This causes decreased pressure in the lower esophageal sphincter (valve or ring of muscle between the esophagus and stomach), allowing acid from the stomach into the esophagus.   Late evening meals and a full stomach. This increases pressure and acid production in the stomach.   A malformed lower esophageal sphincter.  Sometimes, no cause is found. SYMPTOMS   Burning pain in the lower part of the mid-chest behind the breastbone and in the mid-stomach area. This may occur twice a week or more often.   Trouble swallowing.   Sore throat.   Dry cough.   Asthma-like symptoms including chest tightness, shortness of breath, or wheezing.  DIAGNOSIS  Your caregiver may be able to diagnose GERD based on your symptoms. In some cases, X-rays and other tests may be done to check for complications or to check the condition of your stomach and esophagus. TREATMENT    Your caregiver may recommend over-the-counter or prescription medicines to help decrease acid production. Ask your caregiver before starting or adding any new medicines.  HOME CARE INSTRUCTIONS   Change the factors that you can control. Ask your caregiver for guidance concerning weight loss, quitting smoking, and alcohol consumption.   Avoid foods and drinks that make your symptoms worse, such as:   Caffeine or alcoholic drinks.   Chocolate.   Peppermint or mint flavorings.   Garlic and onions.   Spicy foods.   Citrus fruits, such as oranges, lemons, or limes.   Tomato-based foods such as sauce, chili, salsa, and pizza.   Fried and fatty foods.   Avoid lying down for the 3 hours prior to your bedtime or prior to taking a nap.   Eat small, frequent meals instead of large meals.   Wear loose-fitting clothing. Do not wear anything tight around your waist that causes pressure on your stomach.   Raise the head of your bed 6 to 8 inches with wood blocks to help you sleep. Extra pillows will not help.   Only take over-the-counter or prescription medicines for pain, discomfort, or fever as directed by your caregiver.   Do not take aspirin, ibuprofen, or other nonsteroidal anti-inflammatory drugs (NSAIDs).  SEEK IMMEDIATE MEDICAL CARE IF:   You have pain in your arms, neck, jaw, teeth, or back.   Your pain increases or changes in intensity or duration.   You develop nausea, vomiting, or sweating (diaphoresis).   You develop shortness of breath, or you faint.  Your vomit is green, yellow, black, or looks like coffee grounds or blood.   Your stool is red, bloody, or black.  These symptoms could be signs of other problems, such as heart disease, gastric bleeding, or esophageal bleeding. MAKE SURE YOU:   Understand these instructions.   Will watch your condition.   Will get help right away if you are not doing well or get worse.  Document Released: 04/23/2005 Document  Revised: 07/03/2011 Document Reviewed: 01/31/2011 [facility] Patient Information 2012 [place], [place].Diet for GERD or PUD Nutrition therapy can help ease the discomfort of gastroesophageal reflux disease (GERD) and peptic ulcer disease (PUD).  HOME CARE INSTRUCTIONS   Eat your meals slowly, in a relaxed setting.   Eat 5 to 6 small meals per day.   If a food causes distress, stop eating it for a period of time.  FOODS TO AVOID  Coffee, regular or decaffeinated.   Cola beverages, regular or low calorie.   Tea, regular or decaffeinated.   Pepper.   Cocoa.   High fat foods, including meats.   Butter, margarine, hydrogenated oil (trans fats).   Peppermint or spearmint (if you have GERD).   Fruits and vegetables if not tolerated.   Alcohol.   Nicotine (smoking or chewing). This is one of the most potent stimulants to acid production in the gastrointestinal tract.   Any food that seems to aggravate your condition.  If you have questions regarding your diet, ask your caregiver or a registered dietitian. TIPS  Lying flat may make symptoms worse. Keep the head of your bed raised 6 to 9 inches (15 to 23 cm) by using a foam wedge or blocks under the legs of the bed.   Do not lay down until 3 hours after eating a meal.   Daily physical activity may help reduce symptoms.  MAKE SURE YOU:   Understand these instructions.   Will watch your condition.   Will get help right away if you are not doing well or get worse.  Document Released: 07/14/2005 Document Revised: 07/03/2011 Document Reviewed: 05/30/2011 [facility] Patient Information 2012 [place], [place].

## 2011-10-15 NOTE — Progress Notes (Signed)
  Subjective:    Patient ID: [name], female    DOB: 11/10/1955, 56 y.o.   MRN: [id number]  HPI  Patient comes in today for SDA for  new problem evaluation. No hx of choking feeling  . But not related to her pills or eating.  Takes pills with water.  Hx of similar symptoms last year or but resolved on its own  .  These symptoms have recurred most recently over the last 6 weeks . She describes it as an ache in her throat worse in the middle of the night and will wake her up from sleep.    Worse if eats before bed.   Then feels like lump in stomach. Like food sitting there . She will did not see symptoms but no vomiting change in her bowel habits or new bloating. Her UTI symptoms are better from the last visit. She hasn't taken anything for this except for Tum's one time she has a history of occasional heartburn but not every day.  Usually going in day but  To go to [place] next week.  For 10 days.  no specidf rx  except Tums.  No vomiting   Diarrhea  .  Nausea seems to be related to Time of day.  No  waterbrash.  Neg sig etoh ; caffiene  Tea  utd on colonoscopy.  Review of Systems Negative for chest pain shortness of breath fever or weight loss. She hasn't had drooling or other choking no difference between solids and liquid food. MS is about the same  Past history family history social history reviewed in the electronic medical record.     Objective:   Physical Exam WDWN in nad BP 120/80  Pulse 80  Temp(Src) 98.1 F (36.7 C) (Oral)  Wt 173 lb (78.472 kg)  SpO2 98% HEENT: Normocephalic eyes PERRLA TMs clear OP no lesions tongue is midline no fasciculations. Neck supple there is some tenderness in the left a.c. Area thyroid questionably palpable Chest:  Clear to A&P without wheezes rales or rhonchi CV:  S1-S2 no gallops or murmurs peripheral perfusion is normal Abdomen:  Sof,t normal bowel sounds without hepatosplenomegaly, no guarding rebound or masses no CVA  tenderness      Assessment & Plan:  Probable GERD with some dysphagia but no obstructive symptoms.   Newer onset in setting of stable MS   She is going out of country and I believe we will do fine placing her on a proton pump inhibitor doing lifestyle changes and planning a GI consult when she gets back into town.  Total visit > 50% spent counseling and coordinating care

## 2011-10-17 ENCOUNTER — Telehealth: Payer: Self-pay | Admitting: *Deleted

## 2011-10-17 MED ORDER — RANITIDINE HCL 150 MG PO CAPS: 150.0000 mg | ORAL_CAPSULE | Freq: Two times a day (BID) | ORAL | Status: DC

## 2011-10-17 NOTE — Telephone Encounter (Signed)
Pt took Prilosec 2 days and itched all night, and has a light sandpaper rash chest and neck.  Asking for advice on whether to change meds, or what Dr. Fabian Sharp wants her to do?  Advised 50 mg of Benadryl, stop the Prilosec and wait to hear from Dr. Fabian Sharp.

## 2011-10-17 NOTE — Telephone Encounter (Signed)
Per Dr. Fabian Sharp called pt to advise that rantidine 150 mg 1-2 daily will be sent to [facility].  Pt states that is okay and when pt returns from vacation she is going to see a GI doctor.

## 2011-11-03 ENCOUNTER — Encounter: Payer: Self-pay | Admitting: Internal Medicine

## 2011-11-04 ENCOUNTER — Encounter: Payer: Self-pay | Admitting: *Deleted

## 2011-11-04 ENCOUNTER — Telehealth: Payer: Self-pay | Admitting: Internal Medicine

## 2011-11-04 NOTE — Telephone Encounter (Signed)
Scheduled patient on 11/11/11 at 8:15 AM.

## 2011-11-11 ENCOUNTER — Ambulatory Visit (INDEPENDENT_AMBULATORY_CARE_PROVIDER_SITE_OTHER): Payer: BC Managed Care – PPO | Admitting: Internal Medicine

## 2011-11-11 ENCOUNTER — Encounter: Payer: Self-pay | Admitting: Internal Medicine

## 2011-11-11 VITALS — BP 128/64 | HR 60 | Ht 66.0 in | Wt 164.0 lb

## 2011-11-11 DIAGNOSIS — R1013 Epigastric pain: Secondary | ICD-10-CM

## 2011-11-11 DIAGNOSIS — K3189 Other diseases of stomach and duodenum: Secondary | ICD-10-CM

## 2011-11-11 DIAGNOSIS — K219 Gastro-esophageal reflux disease without esophagitis: Secondary | ICD-10-CM

## 2011-11-11 MED ORDER — RANITIDINE HCL 300 MG PO TABS: 300.0000 mg | ORAL_TABLET | Freq: Two times a day (BID) | ORAL | Status: DC

## 2011-11-11 NOTE — Progress Notes (Signed)
[name] 03-11-1956 MRN [id number]   History of Present Illness:  This is a 56 year old white female with multiple sclerosis of at least 12 years duration. She is complaining of a burning sensation in the back of her throat which initially started at night and now occurs during the day. She has associated epigastric discomfort and decreased appetite resulting in a 5 pounds weight loss. She has a choking feeling and odynophagia to pills and solid food. She had significant heartburn during four of her pregnancies. She tried Prilosec 20 mg a day but developed itching so she was switched to ranitidine 150 mg twice a day which is helping some but she still has a lot of breakthrough symptoms. She had a screening colonoscopy in 2006 with findings of a hyperplastic polyp and moderately severe diverticulosis. She is due for a recall colonoscopy in 2016. She denies taking anti-inflammatory agents. She does not smoke or drink alcohol. She denies hoarseness or nocturnal cough.   Past Medical History  Diagnosis Date  . MS (multiple sclerosis)   . Rhinitis   . Varicose veins   . Superficial phlebitis   . Mood disorder   . Depressive reaction   . Diverticulitis   . Diverticulosis   . GERD (gastroesophageal reflux disease)    Past Surgical History  Procedure Date  . Tonsillectomy and adenoidectomy   . Bladder surgery     Tack  . Abdominal hysterectomy     reports that she has never smoked. She has never used smokeless tobacco. She reports that she drinks alcohol. She reports that she does not use illicit drugs. family history includes Lung cancer in her father.  There is no history of Colon cancer. Allergies  Allergen Reactions  . Codeine     REACTION: itching  . Meclizine Hcl     REACTION: Psychotic reaction  . Tramadol Hcl         Review of Systems: Denies chest pain shortness of breath  The remainder of the 10 point ROS is negative except as outlined in H&P   Physical  Exam: General appearance  Well developed, in no distress. Eyes- non icteric. HEENT nontraumatic, normocephalic. Mouth no lesions, tongue papillated, no cheilosis. Neck supple without adenopathy, thyroid not enlarged, no carotid bruits, no JVD. Lungs Clear to auscultation bilaterally. Cor normal S1, normal S2, regular rhythm, no murmur,  quiet precordium. Abdomen: Soft, relaxed abdomen with normal active bowel sounds. Liver edge at costal margin. Minimal discomfort in epigastrium. No distention. Rectal: soft Hemoccult negative stool. Extremities no pedal edema. Skin no lesions. Neurological alert and oriented x 3. Psychological normal mood and affect.  Assessment and Plan:  Problem #1 Symptoms of heartburn and dyspepsia consistent with gastroesophageal reflux. Patient had symptoms during her prior pregnancies many years ago. She now has an exacerbation of her reflux, possibly due to progression of her multiple sclerosis which may be affecting her esophageal motility. Her symptoms are suggestive of esophagitis. She may also have a hiatal hernia or Barrett's esophagus. There is no evidence of GI blood loss. She needs stronger acid suppression but is concerned about being allergic to PPIs. We will double up on her ranitidine to 300 mg twice a day and I have discussed in detail antireflux measures.I am hesitant to start her on Reglan  because of  CNS side effects. We will also obtain an upper abdominal ultrasoundin view of  a family history of gallbladder disease in her daughter. We will proceed with an upper endoscopy and  biopsy. I advised her to take TUMS or Rolaids, or Gaviscon at bed time or at night for breakthrough symptoms.   11/11/2011 [name]

## 2011-11-11 NOTE — Patient Instructions (Addendum)
You have been scheduled for an endoscopy with propofol. Please follow written instructions given to you at your visit today. You have been scheduled for an abdominal ultrasound at [facility] (1st floor of hospital) on Friday, 11/14/11 at 8:30 am. Please arrive 15 minutes prior to your appointment for registration. Make certain not to have anything to eat or drink 6 hours prior to your appointment. Should you need to reschedule your appointment, please contact radiology at [phone]. Please purchase Gaviscon or Tums over the counter and take at bedtime as needed for breakthrough reflux.  Gastroesophageal Reflux Disease, Adult Gastroesophageal reflux disease (GERD) happens when acid from your stomach flows up into the esophagus. When acid comes in contact with the esophagus, the acid causes soreness (inflammation) in the esophagus. Over time, GERD may create small holes (ulcers) in the lining of the esophagus. CAUSES   Increased body weight. This puts pressure on the stomach, making acid rise from the stomach into the esophagus.   Smoking. This increases acid production in the stomach.   Drinking alcohol. This causes decreased pressure in the lower esophageal sphincter (valve or ring of muscle between the esophagus and stomach), allowing acid from the stomach into the esophagus.   Late evening meals and a full stomach. This increases pressure and acid production in the stomach.   A malformed lower esophageal sphincter.  Sometimes, no cause is found. SYMPTOMS   Burning pain in the lower part of the mid-chest behind the breastbone and in the mid-stomach area. This may occur twice a week or more often.   Trouble swallowing.   Sore throat.   Dry cough.   Asthma-like symptoms including chest tightness, shortness of breath, or wheezing.  DIAGNOSIS  Your caregiver may be able to diagnose GERD based on your symptoms. In some cases, X-rays and other tests may be done to check for  complications or to check the condition of your stomach and esophagus. TREATMENT  Your caregiver may recommend over-the-counter or prescription medicines to help decrease acid production. Ask your caregiver before starting or adding any new medicines.  HOME CARE INSTRUCTIONS   Change the factors that you can control. Ask your caregiver for guidance concerning weight loss, quitting smoking, and alcohol consumption.   Avoid foods and drinks that make your symptoms worse, such as:   Caffeine or alcoholic drinks.   Chocolate.   Peppermint or mint flavorings.   Garlic and onions.   Spicy foods.   Citrus fruits, such as oranges, lemons, or limes.   Tomato-based foods such as sauce, chili, salsa, and pizza.   Fried and fatty foods.   Avoid lying down for the 3 hours prior to your bedtime or prior to taking a nap.   Eat small, frequent meals instead of large meals.   Wear loose-fitting clothing. Do not wear anything tight around your waist that causes pressure on your stomach.   Raise the head of your bed 6 to 8 inches with wood blocks to help you sleep. Extra pillows will not help.   Only take over-the-counter or prescription medicines for pain, discomfort, or fever as directed by your caregiver.   Do not take aspirin, ibuprofen, or other nonsteroidal anti-inflammatory drugs (NSAIDs).  SEEK IMMEDIATE MEDICAL CARE IF:   You have pain in your arms, neck, jaw, teeth, or back.   Your pain increases or changes in intensity or duration.   You develop nausea, vomiting, or sweating (diaphoresis).   You develop shortness of breath, or you faint.  Your vomit is green, yellow, black, or looks like coffee grounds or blood.   Your stool is red, bloody, or black.  These symptoms could be signs of other problems, such as heart disease, gastric bleeding, or esophageal bleeding. MAKE SURE YOU:   Understand these instructions.   Will watch your condition.   Will get help right away if  you are not doing well or get worse.  Document Released: 04/23/2005 Document Revised: 07/03/2011 Document Reviewed: 01/31/2011 [facility] Patient Information 2012 [employer], [place]. ------------------------------------------------------------------  Diet for GERD or PUD Nutrition therapy can help ease the discomfort of gastroesophageal reflux disease (GERD) and peptic ulcer disease (PUD).  HOME CARE INSTRUCTIONS   Eat your meals slowly, in a relaxed setting.   Eat 5 to 6 small meals per day.   If a food causes distress, stop eating it for a period of time.  FOODS TO AVOID  Coffee, regular or decaffeinated.   Cola beverages, regular or low calorie.   Tea, regular or decaffeinated.   Pepper.   Cocoa.   High fat foods, including meats.   Butter, margarine, hydrogenated oil (trans fats).   Peppermint or spearmint (if you have GERD).   Fruits and vegetables if not tolerated.   Alcohol.   Nicotine (smoking or chewing). This is one of the most potent stimulants to acid production in the gastrointestinal tract.   Any food that seems to aggravate your condition.  If you have questions regarding your diet, ask your caregiver or a registered dietitian. TIPS  Lying flat may make symptoms worse. Keep the head of your bed raised 6 to 9 inches (15 to 23 cm) by using a foam wedge or blocks under the legs of the bed.   Do not lay down until 3 hours after eating a meal.   Daily physical activity may help reduce symptoms.  MAKE SURE YOU:   Understand these instructions.   Will watch your condition.   Will get help right away if you are not doing well or get worse.  Document Released: 07/14/2005 Document Revised: 07/03/2011 Document Reviewed: 05/30/2011 [facility] Patient Information 2012 [place], [place] ------------------------------------------------------------------------------- CC: Dr Berniece Andreas

## 2011-11-14 ENCOUNTER — Ambulatory Visit (HOSPITAL_COMMUNITY)
Admission: RE | Admit: 2011-11-14 | Discharge: 2011-11-14 | Disposition: A | Discharge status: Home / Self Care | Payer: BC Managed Care – PPO | Source: Ambulatory Visit | Attending: Internal Medicine | Admitting: Internal Medicine

## 2011-11-14 DIAGNOSIS — R1013 Epigastric pain: Secondary | ICD-10-CM

## 2011-11-14 DIAGNOSIS — K219 Gastro-esophageal reflux disease without esophagitis: Secondary | ICD-10-CM

## 2011-11-14 DIAGNOSIS — R109 Unspecified abdominal pain: Secondary | ICD-10-CM | POA: Insufficient documentation

## 2011-11-14 DIAGNOSIS — K3189 Other diseases of stomach and duodenum: Secondary | ICD-10-CM | POA: Insufficient documentation

## 2011-11-17 ENCOUNTER — Encounter: Payer: Self-pay | Admitting: Internal Medicine

## 2011-11-17 ENCOUNTER — Ambulatory Visit (INDEPENDENT_AMBULATORY_CARE_PROVIDER_SITE_OTHER): Payer: BC Managed Care – PPO | Admitting: Internal Medicine

## 2011-11-17 VITALS — BP 120/78 | HR 95 | Temp 98.5°F | Wt 164.0 lb

## 2011-11-17 DIAGNOSIS — F4321 Adjustment disorder with depressed mood: Secondary | ICD-10-CM

## 2011-11-17 DIAGNOSIS — G35 Multiple sclerosis: Secondary | ICD-10-CM

## 2011-11-17 DIAGNOSIS — R1314 Dysphagia, pharyngoesophageal phase: Secondary | ICD-10-CM

## 2011-11-17 DIAGNOSIS — K219 Gastro-esophageal reflux disease without esophagitis: Secondary | ICD-10-CM

## 2011-11-17 MED ORDER — ESCITALOPRAM OXALATE 10 MG PO TABS: 5.0000 mg | ORAL_TABLET | Freq: Every day | ORAL | Status: DC

## 2011-11-17 NOTE — Progress Notes (Signed)
  Subjective:    Patient ID: [name], female    DOB: 1956/01/12, 56 y.o.   MRN: [id number]  HPI Pt comes in as fu from gi evaluation .   Anxiety  ? felt to be part of her sx complex.  . Sleep is an issues at tome   About 75 % better and throat 50 % better. Remote hx of depression and had a psychotic reaction to meclizine   and saw Dr Evelene Croon  At some time.  Ended up  All meds and felt ok.   No different  Fearful and worry. And hard to concentrate.   Asks for advice and intervention. She is not a recluse but feeling tendency to go that way.  No appt with Dr Evelene Croon  until MAY  . Decrease  sleep and appetite . Doesn't.  get out much.  Chiro says has pinched nerve and back tied up in knot. s adding to issues. Ms no change from last visit. No recent fall Neg TAD  Review of Systems No fever cp sob vomiting  Cough   No bleeding  No suicidality  Past history family history social history reviewed in the electronic medical record.    Objective:   Physical Exam BP 120/78  Pulse 95  Temp(Src) 98.5 F (36.9 C) (Oral)  Wt 164 lb (74.39 kg)  SpO2 98% WDWN in nad somewhat depressed  affect but nl eye contact speech and thought. Oriented x 3 and no noted deficits in memory, attention, and speec  DATA REVIEWED: EHR    Assessment & Plan:   Reactive anxiety depression sx  In the setting of MS  With poss gi sx also  Dysphagia . Poss motility gerd sx  Agree with seeing Dr Evelene Croon . In the meantime we can add controller med to see how she does with care. And follow up . Total visit > 50% spent counseling and coordinating care

## 2011-11-17 NOTE — Patient Instructions (Signed)
Advise seeing counselor  As we discussed  .  Can begin low dose controller med for anxiety. .      Agree with seeing Dr Evelene Croon.  As follow up.

## 2011-11-18 ENCOUNTER — Telehealth: Payer: Self-pay | Admitting: Internal Medicine

## 2011-11-18 MED ORDER — ESOMEPRAZOLE MAGNESIUM 40 MG PO CPDR: 40.0000 mg | DELAYED_RELEASE_CAPSULE | Freq: Every day | ORAL | Status: DC

## 2011-11-18 NOTE — Telephone Encounter (Signed)
Spoke with patient and she would like to try Nexium 40 mg po. Rx sent to pharmacy. Patient will call if she develops a rash.

## 2011-11-18 NOTE — Telephone Encounter (Signed)
Increased Ranitidine last week to 300 mg BID and it worked well until Saturday. Since Saturday, she has felt the acid in stomach, had nausea and no appetite. She is following Dr. Regino Schultze recommendations from last week with exception of the wedge in bed. She cannot sleep with the wedge and is not sleeping well overall. Reports she saw Dr. Fabian Sharp yesterday and started Lexapro. She states she has had depression in the past but this is more like anxiety. She will be seeing her psychiatrist in May. Please, advise.

## 2011-11-18 NOTE — Telephone Encounter (Signed)
The next step would be a PPI, ,but she had a rash with Prilosec. Ask her if she wants to try Nexiem 40 mg po qd, # 20, 1 po qd

## 2011-11-19 ENCOUNTER — Other Ambulatory Visit: Payer: Self-pay | Admitting: Internal Medicine

## 2011-12-01 ENCOUNTER — Telehealth: Payer: Self-pay | Admitting: *Deleted

## 2011-12-01 ENCOUNTER — Ambulatory Visit (INDEPENDENT_AMBULATORY_CARE_PROVIDER_SITE_OTHER): Payer: BC Managed Care – PPO | Admitting: Internal Medicine

## 2011-12-01 ENCOUNTER — Telehealth: Payer: Self-pay | Admitting: Internal Medicine

## 2011-12-01 ENCOUNTER — Encounter: Payer: Self-pay | Admitting: Internal Medicine

## 2011-12-01 ENCOUNTER — Ambulatory Visit: Payer: BC Managed Care – PPO | Admitting: Internal Medicine

## 2011-12-01 VITALS — BP 122/70 | HR 89 | Temp 98.4°F | Wt 162.0 lb

## 2011-12-01 DIAGNOSIS — K219 Gastro-esophageal reflux disease without esophagitis: Secondary | ICD-10-CM

## 2011-12-01 DIAGNOSIS — K921 Melena: Secondary | ICD-10-CM

## 2011-12-01 DIAGNOSIS — IMO0002 Reserved for concepts with insufficient information to code with codable children: Secondary | ICD-10-CM

## 2011-12-01 DIAGNOSIS — N390 Urinary tract infection, site not specified: Secondary | ICD-10-CM

## 2011-12-01 DIAGNOSIS — R10819 Abdominal tenderness, unspecified site: Secondary | ICD-10-CM

## 2011-12-01 DIAGNOSIS — G35 Multiple sclerosis: Secondary | ICD-10-CM

## 2011-12-01 DIAGNOSIS — N398 Other specified disorders of urinary system: Secondary | ICD-10-CM

## 2011-12-01 HISTORY — DX: Melena: K92.1

## 2011-12-01 LAB — CBC WITH DIFFERENTIAL/PLATELET
Basophils Relative: 0.3 % (ref 0.0–3.0)
Eosinophils Absolute: 0.2 10*3/uL (ref 0.0–0.7)
Eosinophils Relative: 1.9 % (ref 0.0–5.0)
HCT: 36.9 % (ref 36.0–46.0)
Lymphs Abs: 2.1 10*3/uL (ref 0.7–4.0)
MCHC: 33.4 g/dL (ref 30.0–36.0)
MCV: 90.3 fl (ref 78.0–100.0)
Monocytes Absolute: 0.9 10*3/uL (ref 0.1–1.0)
Neutrophils Relative %: 63.3 % (ref 43.0–77.0)
RBC: 4.08 Mil/uL (ref 3.87–5.11)
WBC: 8.7 10*3/uL (ref 4.5–10.5)

## 2011-12-01 LAB — HEPATIC FUNCTION PANEL
AST: 16 U/L (ref 0–37)
Albumin: 4 g/dL (ref 3.5–5.2)
Total Bilirubin: 0.7 mg/dL (ref 0.3–1.2)

## 2011-12-01 LAB — TSH: TSH: 1.09 u[IU]/mL (ref 0.35–5.50)

## 2011-12-01 LAB — POCT URINALYSIS DIPSTICK
Blood, UA: NEGATIVE
Nitrite, UA: NEGATIVE
Urobilinogen, UA: 0.2
pH, UA: 7

## 2011-12-01 LAB — BASIC METABOLIC PANEL
BUN: 8 mg/dL (ref 6–23)
Calcium: 9.4 mg/dL (ref 8.4–10.5)
Creatinine, Ser: 0.7 mg/dL (ref 0.4–1.2)
GFR: 87.15 mL/min (ref >60.00)
Glucose, Bld: 96 mg/dL (ref 70–99)
Potassium: 4.6 mEq/L (ref 3.5–5.1)

## 2011-12-01 LAB — SEDIMENTATION RATE: Sed Rate: 30 mm/hr — ABNORMAL HIGH (ref 0–22)

## 2011-12-01 LAB — T4, FREE: Free T4: 0.93 ng/dL (ref 0.60–1.60)

## 2011-12-01 MED ORDER — CIPROFLOXACIN HCL 500 MG PO TABS: 500.0000 mg | ORAL_TABLET | Freq: Two times a day (BID) | ORAL | Status: AC

## 2011-12-01 NOTE — Telephone Encounter (Signed)
Pls advise.  

## 2011-12-01 NOTE — Telephone Encounter (Signed)
Pt is calling stating she has appt Friday, but does not believe she can wait that long.  She is having frequency, urgency, and dysuria. Would like to bring a sterile specimen and send it over for UA evaulation.  With her MS, it is quite difficult to get into the office.

## 2011-12-01 NOTE — Progress Notes (Signed)
  Subjective:    Patient ID: [name], female    DOB: 03-06-1956, 56 y.o.   MRN: [id number]  HPI Patient comes in today for SDA for  new problem evaluation. onging problem has   been going off and on for a few months. Of note in February she was treated for an enterococcal urinary tract infection but this was with Septra and she felt that she got better see note never changed to him ampicillin. Over the last week or so she is having urinary frequency increasing off and on she had chills a few days ago ? If infection making  MS   Worse.   Had blood in stool last  Night.  Stopped  nexium  First diarrhea and then blood an mucous. Has ug nause and esophagus. Has a call into Dr. Mena Pauls office. Feels that she has upper esophageal pressure.   Review of Systems No coughing syncope new numbness new rashes.  Past history family history social history reviewed in the electronic medical record.     Objective:   Physical Exam BP 122/70  Pulse 89  Temp(Src) 98.4 F (36.9 C) (Oral)  Wt 162 lb (73.483 kg)  SpO2 98%  Well-developed well-nourished in no acute distress looks under the weather but nontoxic. HEENT no acute findings neck supple without masses or adenopathy Chest clear to auscultation and percussion cardiac S1-S2 no gallops or murmurs Abdomen:  Sof,t normal bowel sounds without hepatosplenomegaly, no guarding rebound or masses no CVA tenderness she does have some tenderness over the suprapubic area.No clubbing cyanosis or edema no bruising or bleeding Her analysis shows large white blood cells  Review of all cultures showed enterococcus a few months ago sensitive to all but doxy    Assessment & Plan:  MS  Voiding dysfunction / uti.  Rule out other causes History of blood in stool x1 with GI symptoms that she has been ongoing. Would follow up with Dr. Regino Schultze office lab testing today for CBC.  Close followup if not totally better would recheck her urine. Either way she  can come for UA only after off meds.

## 2011-12-01 NOTE — Telephone Encounter (Signed)
Left a message for patient to call me. 

## 2011-12-01 NOTE — Telephone Encounter (Signed)
Patient calling to report she is having nausea again. States she felt better after starting the Nexium but is having nausea over weekend. She is not sure if the nausea is related to her MS. Reflux is better with Nexium. She is noticing the food sticking in her throat some still. States she went to the bathroom last night and had bright, red blood with diarrhea. She also had bright, red blood when she wiped. She had another episode later with mucous and blood. She has not had another stool since. States she had diarrhea/loose stools for several days prior to the episode. She also thinks she may have a bladder infection and she has an appointment with her PCP today to check for this. Denies a hx of recent hemorrhoids. States she did not take her Nexium today. Please, advise.

## 2011-12-01 NOTE — Telephone Encounter (Signed)
I am glad she is seeing her PCP  Today so he can evaluate her rectal bleeding. She is due for recall colon in 2016. If bleeding continues despite of topical steroid cream such as Analpram, then we can move the colon down.

## 2011-12-01 NOTE — Patient Instructions (Signed)
It appears that you have a urinary tract infection however it doesn't explain all of your symptoms.  We will begin you on Cipro and await urine culture. I would like you to followup with Dr. Mena Pauls office about the blood in the stool. Will contact you with the laboratory results when they are available.  It is possible that you may need to see a urologist if you have persistent and recurrent UTIs from your MS bladder dysfunction.

## 2011-12-01 NOTE — Telephone Encounter (Signed)
Per Dr. Fabian Sharp called pt to get information. Pt states she is having a dull ache above pubic bone and pain in urethra.  Pt has decided to come in on 12/01/11 at 2:15pm.

## 2011-12-02 ENCOUNTER — Other Ambulatory Visit: Payer: Self-pay | Admitting: Internal Medicine

## 2011-12-02 DIAGNOSIS — N390 Urinary tract infection, site not specified: Secondary | ICD-10-CM

## 2011-12-02 DIAGNOSIS — G35 Multiple sclerosis: Secondary | ICD-10-CM

## 2011-12-02 NOTE — Telephone Encounter (Signed)
Spoke with patient and she states she saw her PCP yesterday and she does have a UTI. She states she has not had any more rectal bleeding and does not wish to have an rx at this time. She will call back if she has more rectal bleeding. She also states she stopped the Nexium yesterday and felt better. Suggested she try the Nexium again.

## 2011-12-05 MED ORDER — AMOXICILLIN 500 MG PO CAPS: 500.0000 mg | ORAL_CAPSULE | Freq: Three times a day (TID) | ORAL | Status: AC

## 2011-12-05 NOTE — Progress Notes (Signed)
Addended by: Azucena Freed on: 12/05/2011 05:11 PM   Modules accepted: Orders

## 2011-12-05 NOTE — Progress Notes (Signed)
Quick Note:  Left a message for pt to return call. Rx sent to pt's pharmacy. ______

## 2011-12-19 ENCOUNTER — Other Ambulatory Visit (INDEPENDENT_AMBULATORY_CARE_PROVIDER_SITE_OTHER): Payer: BC Managed Care – PPO

## 2011-12-19 DIAGNOSIS — N390 Urinary tract infection, site not specified: Secondary | ICD-10-CM

## 2011-12-19 DIAGNOSIS — G35 Multiple sclerosis: Secondary | ICD-10-CM

## 2011-12-19 LAB — POCT URINALYSIS DIPSTICK
Bilirubin, UA: NEGATIVE
Glucose, UA: NEGATIVE
Ketones, UA: NEGATIVE
Spec Grav, UA: 1.01

## 2011-12-24 ENCOUNTER — Telehealth: Payer: Self-pay | Admitting: Internal Medicine

## 2011-12-24 NOTE — Telephone Encounter (Signed)
Pt requesting results of ua. Please contact

## 2011-12-24 NOTE — Telephone Encounter (Signed)
Pls advise on u/a and culture results.

## 2011-12-24 NOTE — Telephone Encounter (Signed)
See result note for results and antibiotic plan to call in  thanks

## 2011-12-25 MED ORDER — CEPHALEXIN 500 MG PO CAPS: 500.0000 mg | ORAL_CAPSULE | Freq: Three times a day (TID) | ORAL | Status: AC

## 2011-12-25 NOTE — Progress Notes (Signed)
Quick Note:  Left a message for pt to return call. ______ 

## 2011-12-25 NOTE — Progress Notes (Signed)
Quick Note:  Called and spoke with pt and pt is aware. ______ 

## 2011-12-25 NOTE — Telephone Encounter (Signed)
Left a message for pt to return call about lab results.  Antibiotic sent to pharmacy.

## 2011-12-31 ENCOUNTER — Encounter: Payer: BC Managed Care – PPO | Admitting: Internal Medicine

## 2011-12-31 ENCOUNTER — Other Ambulatory Visit: Payer: Self-pay | Admitting: Internal Medicine

## 2012-01-09 ENCOUNTER — Ambulatory Visit (INDEPENDENT_AMBULATORY_CARE_PROVIDER_SITE_OTHER): Payer: BC Managed Care – PPO | Admitting: Internal Medicine

## 2012-01-09 ENCOUNTER — Encounter: Payer: Self-pay | Admitting: Internal Medicine

## 2012-01-09 VITALS — BP 120/84 | HR 88 | Temp 98.3°F | Wt 161.0 lb

## 2012-01-09 DIAGNOSIS — IMO0002 Reserved for concepts with insufficient information to code with codable children: Secondary | ICD-10-CM

## 2012-01-09 DIAGNOSIS — N390 Urinary tract infection, site not specified: Secondary | ICD-10-CM | POA: Insufficient documentation

## 2012-01-09 DIAGNOSIS — G35 Multiple sclerosis: Secondary | ICD-10-CM

## 2012-01-09 DIAGNOSIS — N398 Other specified disorders of urinary system: Secondary | ICD-10-CM

## 2012-01-09 LAB — POCT URINALYSIS DIPSTICK
Bilirubin, UA: NEGATIVE
Ketones, UA: NEGATIVE
Leukocytes, UA: NEGATIVE
Protein, UA: NEGATIVE

## 2012-01-09 NOTE — Patient Instructions (Signed)
We will do a referral to urology to get an opinion about the bladder  dysfunction from the  MS  And the UTIS .  Consideration of suppressive therapies or  meds to help bladder function.

## 2012-01-09 NOTE — Progress Notes (Signed)
  Subjective:    Patient ID: [name], female    DOB: May 24, 1956, 56 y.o.   MRN: [id number]  HPI Pt comes in  for fu  Of  recurrerent UTIS .  See past notes and urine culture results. Pain over the bladder is better  But hard to tell  When has uti.   Hard to pick up on cues for infection.  Still with urgency and has  Urge incontinence.  No current fever.  She think her MS and fatigue feels better after her UTIs been treated.  Review of Systems No new falling new symptoms fever chills abdominal pain. Rest as per history of present illness Past history family history social history reviewed in the electronic medical record.     Objective:   Physical Exam BP 120/84  Pulse 88  Temp 98.3 F (36.8 C) (Oral)  Wt 161 lb (73.029 kg)  Well-developed well-nourished in no acute distress looks improved from last visit. Abdomen without significant tenderness no CVA pain she is ambulatory.  Urinalysis is clear.     Assessment & Plan:   History of recent UTIs Bladder voiding dysfunction possibly from MS  Unsure she's a candidate for intervention cautious with any kind of daily antibiotic prophylaxis because of that potential side effects and resistance. She will be on the lookout for any early signs that could be a UTI to be evaluated.  In the meantime we'll get urology to see her for opinion.

## 2012-01-10 ENCOUNTER — Other Ambulatory Visit: Payer: Self-pay | Admitting: Internal Medicine

## 2012-01-12 NOTE — Telephone Encounter (Signed)
This pt would like refills of Lexapro.  She was last seen on 01/09/12.  She has no future appt.  Please advise.

## 2012-01-14 ENCOUNTER — Other Ambulatory Visit: Payer: Self-pay | Admitting: Internal Medicine

## 2012-01-14 NOTE — Telephone Encounter (Signed)
Ok to refill x 6   Of whatever dose she is taking and correct the  Sig.

## 2012-02-02 ENCOUNTER — Telehealth: Payer: Self-pay | Admitting: Internal Medicine

## 2012-02-02 MED ORDER — CEFUROXIME AXETIL 500 MG PO TABS: 500.0000 mg | ORAL_TABLET | Freq: Two times a day (BID) | ORAL | Status: AC

## 2012-02-02 NOTE — Telephone Encounter (Signed)
I spoke with Dr. Fabian Sharp. No appt needed, Dr. Fabian Sharp will call in rx for patient. Pt is seeing Urology on 7-17. [facility] [address] for her [facility].

## 2012-02-02 NOTE — Telephone Encounter (Signed)
Pt notified that medication was sent to [facility].

## 2012-02-02 NOTE — Telephone Encounter (Signed)
Caller: [name]/Patient; PCP: Madelin Headings.; CB#: [phone]; ; ; Call regarding Urinary Pain/Bleeding; onset about 01-19-12 of some urinary sxs while on vacation.  she said 02-01-12 her MS sxs were excaberated and they have figured out that is usually due to infection.  All emergent sxs per Urinary Symptoms Female R/O except has one or more Urinary tract symptoms and has not been previously evaluated.  No appt open with her provider will send message to office.   She triaged out for 24 hrs but dt MS sxs worsening I was trying to get her appt today.  Is there a way to work her in with Dr Fabian Sharp and if not she was wondering if Keflex could be called in she states her UTI's are always strep.

## 2012-02-02 NOTE — Telephone Encounter (Signed)
What rx would you like to send in?

## 2012-02-02 NOTE — Telephone Encounter (Signed)
rx ceftin 500 bid for 5 days. Disp 10

## 2012-02-05 ENCOUNTER — Other Ambulatory Visit: Payer: Self-pay | Admitting: *Deleted

## 2012-02-05 MED ORDER — ESOMEPRAZOLE MAGNESIUM 40 MG PO CPDR: 40.0000 mg | DELAYED_RELEASE_CAPSULE | Freq: Every day | ORAL | Status: DC

## 2012-02-06 ENCOUNTER — Encounter: Payer: Self-pay | Admitting: Internal Medicine

## 2012-02-06 ENCOUNTER — Ambulatory Visit (AMBULATORY_SURGERY_CENTER): Payer: BC Managed Care – PPO | Admitting: Internal Medicine

## 2012-02-06 VITALS — BP 126/81 | HR 77 | Temp 98.9°F | Resp 12 | Ht 66.0 in | Wt 164.0 lb

## 2012-02-06 DIAGNOSIS — K3189 Other diseases of stomach and duodenum: Secondary | ICD-10-CM

## 2012-02-06 DIAGNOSIS — K319 Disease of stomach and duodenum, unspecified: Secondary | ICD-10-CM

## 2012-02-06 DIAGNOSIS — R1013 Epigastric pain: Secondary | ICD-10-CM

## 2012-02-06 DIAGNOSIS — K219 Gastro-esophageal reflux disease without esophagitis: Secondary | ICD-10-CM

## 2012-02-06 MED ORDER — SODIUM CHLORIDE 0.9 % IV SOLN: 500.0000 mL | INTRAVENOUS | Status: DC

## 2012-02-06 NOTE — Progress Notes (Signed)
Propofol per j nulty crna. See scanned intra procedure report. ewm 

## 2012-02-06 NOTE — Progress Notes (Signed)
Patient did not experience any of the following events: a burn prior to discharge; a fall within the facility; wrong site/side/patient/procedure/implant event; or a hospital transfer or hospital admission upon discharge from the facility. (G8907) Patient did not have preoperative order for IV antibiotic SSI prophylaxis. (G8918)  

## 2012-02-06 NOTE — Op Note (Addendum)
[facility] [address] [place], [place]  [address]  ENDOSCOPY PROCEDURE REPORT  PATIENT:  Margaret, Montgomery  MR#:  [id number] BIRTHDATE:  11/07/1955, 55 yrs. old  GENDER:  female  ENDOSCOPIST:  Hedwig Morton. Juanda Chance, MD Referred by:  Neta Mends. [name], M.D., Dr R.Sader,neurology,[facility]  PROCEDURE DATE:  02/06/2012 PROCEDURE:  EGD with biopsy, 43239 ASA CLASS:  Class II INDICATIONS:  dyspepsia, dysphagia hx MS, intolerant to PPI, has been on Ranitidine, negative abd. ultrasound  MEDICATIONS:   MAC sedation, administered by CRNA, propofol (Diprivan) 180 mg TOPICAL ANESTHETIC:  none  DESCRIPTION OF PROCEDURE:   After the risks benefits and alternatives of the procedure were thoroughly explained, informed consent was obtained.  The LB GIF-H180 [id number] endoscope was introduced through the mouth and advanced to the second portion of the duodenum, without limitations.  The instrument was slowly withdrawn as the mucosa was fully examined. <<PROCEDUREIMAGES>>  The upper, middle, and distal third of the esophagus were carefully inspected and no abnormalities were noted. The z-line was well seen at the GEJ. The endoscope was pushed into the fundus which was normal including a retroflexed view. The antrum,gastric body, first and second part of the duodenum were unremarkable. no stricture, no retained food, few fundic gland polyps With standard forceps, a biopsy was obtained and sent to pathology. gastric antrum Bx to r/o H (see image1, image2, image3, image4, image5, image6, and image7).pylori    Retroflexed views revealed no abnormalities.    The scope was then withdrawn from the patient and the procedure completed.  COMPLICATIONS:  None  ENDOSCOPIC IMPRESSION: 1) Normal EGD s/p g-e junction biopsiea and antral biopsies suspect esophageal dismotility and/or gastroparesis due to MS RECOMMENDATIONS: 1) Await biopsy results add Gaviscon 2 tabs pc and hs as  needed continude Nexiem 40 mg am and Ranitidine 300mg  hs consider GEScan  REPEAT EXAM:  In 0 year(s) for.  ______________________________ Hedwig Morton. Juanda Chance, MD  CC:  n. REVISED:  02/06/2012 10:04 AM eSIGNED:   Hedwig Morton. Devory Mckinzie at 02/06/2012 10:04 AM  [name], [name], [id number]

## 2012-02-06 NOTE — Patient Instructions (Addendum)
Discharge instructions given with verbal understanding. Biopsies taken. Resume previous medications. YOU HAD AN ENDOSCOPIC PROCEDURE TODAY AT THE  ENDOSCOPY CENTER: Refer to the procedure report that was given to you for any specific questions about what was found during the examination.  If the procedure report does not answer your questions, please call your gastroenterologist to clarify.  If you requested that your care partner not be given the details of your procedure findings, then the procedure report has been included in a sealed envelope for you to review at your convenience later.  YOU SHOULD EXPECT: Some feelings of bloating in the abdomen. Passage of more gas than usual.  Walking can help get rid of the air that was put into your GI tract during the procedure and reduce the bloating. If you had a lower endoscopy (such as a colonoscopy or flexible sigmoidoscopy) you may notice spotting of blood in your stool or on the toilet paper. If you underwent a bowel prep for your procedure, then you may not have a normal bowel movement for a few days.  DIET: Your first meal following the procedure should be a light meal and then it is ok to progress to your normal diet.  A half-sandwich or bowl of soup is an example of a good first meal.  Heavy or fried foods are harder to digest and may make you feel nauseous or bloated.  Likewise meals heavy in dairy and vegetables can cause extra gas to form and this can also increase the bloating.  Drink plenty of fluids but you should avoid alcoholic beverages for 24 hours.  ACTIVITY: Your care partner should take you home directly after the procedure.  You should plan to take it easy, moving slowly for the rest of the day.  You can resume normal activity the day after the procedure however you should NOT DRIVE or use heavy machinery for 24 hours (because of the sedation medicines used during the test).    SYMPTOMS TO REPORT IMMEDIATELY: A gastroenterologist  can be reached at any hour.  During normal business hours, 8:30 AM to 5:00 PM Monday through Friday, call [phone].  After hours and on weekends, please call the GI answering service at [phone] who will take a message and have the physician on call contact you.   Following upper endoscopy (EGD)  Vomiting of blood or coffee ground material  New chest pain or pain under the shoulder blades  Painful or persistently difficult swallowing  New shortness of breath  Fever of 100F or higher  Black, tarry-looking stools  FOLLOW UP: If any biopsies were taken you will be contacted by phone or by letter within the next 1-3 weeks.  Call your gastroenterologist if you have not heard about the biopsies in 3 weeks.  Our staff will call the home number listed on your records the next business day following your procedure to check on you and address any questions or concerns that you may have at that time regarding the information given to you following your procedure. This is a courtesy call and so if there is no answer at the home number and we have not heard from you through the emergency physician on call, we will assume that you have returned to your regular daily activities without incident.  SIGNATURES/CONFIDENTIALITY: You and/or your care partner have signed paperwork which will be entered into your electronic medical record.  These signatures attest to the fact that that the information above on your After   Visit Summary has been reviewed and is understood.  Full responsibility of the confidentiality of this discharge information lies with you and/or your care-partner. 

## 2012-02-09 ENCOUNTER — Telehealth: Payer: Self-pay | Admitting: *Deleted

## 2012-02-09 NOTE — Telephone Encounter (Signed)
  Follow up Call-  Call back number 02/06/2012  Post procedure Call Back phone  # [phone]  Permission to leave phone message Yes     Patient questions:  Do you have a fever, pain , or abdominal swelling? no Pain Score  0 *  Have you tolerated food without any problems? yes  Have you been able to return to your normal activities? yes  Do you have any questions about your discharge instructions: Diet   no Medications  no Follow up visit  no  Do you have questions or concerns about your Care? no  Actions: * If pain score is 4 or above: No action needed, pain <4.  Pt had some questions for Dr Juanda Chance.  She states that she will call the office in a few days to speak to Dr Regino Schultze nurse.

## 2012-02-10 ENCOUNTER — Encounter: Payer: Self-pay | Admitting: Internal Medicine

## 2012-02-26 ENCOUNTER — Telehealth: Payer: Self-pay | Admitting: Internal Medicine

## 2012-02-26 NOTE — Telephone Encounter (Signed)
Pt wanted to talk to about the results of her 02/06/2012 endoscopy.

## 2012-02-26 NOTE — Telephone Encounter (Signed)
Left message for pt to call back  °

## 2012-02-26 NOTE — Telephone Encounter (Signed)
Pt has several questions for Dr. Juanda Chance regarding symptoms that she is having and her recent EGD. Pt scheduled to see Dr. Juanda Chance 03/30/12@9am . Pt aware of appt date and time.

## 2012-03-12 ENCOUNTER — Encounter: Payer: Self-pay | Admitting: *Deleted

## 2012-03-30 ENCOUNTER — Ambulatory Visit (INDEPENDENT_AMBULATORY_CARE_PROVIDER_SITE_OTHER): Payer: BC Managed Care – PPO | Admitting: Internal Medicine

## 2012-03-30 ENCOUNTER — Encounter: Payer: Self-pay | Admitting: Internal Medicine

## 2012-03-30 VITALS — BP 122/64 | HR 80 | Ht 66.0 in | Wt 163.4 lb

## 2012-03-30 DIAGNOSIS — K224 Dyskinesia of esophagus: Secondary | ICD-10-CM

## 2012-03-30 DIAGNOSIS — G35 Multiple sclerosis: Secondary | ICD-10-CM

## 2012-03-30 MED ORDER — ESOMEPRAZOLE MAGNESIUM 40 MG PO CPDR: 40.0000 mg | DELAYED_RELEASE_CAPSULE | Freq: Every day | ORAL | Status: DC

## 2012-03-30 NOTE — Progress Notes (Signed)
Case ID: [id number] Member Number: [id number]  Case Type: Initial Review Case Start Date: 03/30/2012  Case Status: Coverage has been APPROVED. You will receive a confirmation letter confirming approval of this medication. The patient will also be notified of this approval via an automated outbound phone call or a letter. Please allow approximately 2 hours to update our system with the approval. Once updated, the prescription can be re-submitted.   Coverage  Start Date: 03/09/2012 Coverage  End Date: 03/30/2013   Patient  First Name: Margaret Patient  Last Name: Montgomery DOB: March 31, 1956  Patient  Street Address: [address]  Patient City: [place] Patient State: [place] Patient Zip: [phone]   Drug Name  & Strength: NEXIUM 40 MG CAPSULE

## 2012-03-30 NOTE — Progress Notes (Signed)
[name] 08-20-1955 MRN [id number]   History of Present Illness:  This is a 56 year old white female with multiple sclerosis and gastroesophageal reflux. She had a recent upper endoscopy which was normal. She is much improved in terms of burning in her throat, nausea and dyspepsia. She has stopped taking her Nexium and Gaviscon and is doing well. She has occasional dysphagia to solids and liquids attributed to esophageal dysmotility. Biopsies from the GE junction and gastric antrum showed essentially normal tissue. There was no H. Pylori. She denies any dysphagia to pills. She has been intolerant to PPI's, specifically Prilosec but she has been able to take Nexium. She has chronic urinary tract infections followed by Dr. Patsi Montgomery. She feels that her infections flareup her GI symptoms. She is on Lexapro and Wellbutrin from Dr [name].   Past Medical History  Diagnosis Date  . MS (multiple sclerosis)   . Rhinitis   . Varicose veins   . Superficial phlebitis   . Mood disorder   . Depressive reaction   . Diverticulitis   . Diverticulosis   . GERD (gastroesophageal reflux disease)   . Anemia   . Gastropathy 2013    reactive   Past Surgical History  Procedure Date  . Tonsillectomy and adenoidectomy   . Bladder surgery     Tack  . Abdominal hysterectomy   . Shoulder adhesion release     reports that she has never smoked. She has never used smokeless tobacco. She reports that she drinks alcohol. She reports that she does not use illicit drugs. family history includes Lung cancer in her father.  There is no history of Colon cancer. Allergies  Allergen Reactions  . Codeine     REACTION: itching  . Hydrocodone Itching  . Meclizine Hcl     REACTION: Psychotic reaction  . Oxycodone Itching  . Prilosec (Omeprazole Magnesium) Hives  . Tegretol (Carbamazepine) Itching  . Tramadol Hcl         Review of Systems: Positive for constipation for which she takes high-fiber diet  The remainder of the 10 point ROS is negative except as outlined in H&P     Assessment and Plan:  Problem #1 Multiple Sclerosis resulting in esophageal dysmotility and gastroesophageal reflux. She is currently asymptomatic. If her symptoms recur, she will get back on Nexium and Gaviscon as well as antireflux measures.  Problem #2 Chronic urinary tract infections followed by Dr. Patsi Montgomery.  Problem #3 Colorectal screening. Her last colonoscopy was in 2006. She had a hyperplastic polyp and moderately severe diverticulosis. She will be due for a recall colonoscopy in 2016.   03/30/2012 [name]

## 2012-03-30 NOTE — Patient Instructions (Addendum)
We have sent the following medications to your pharmacy for you to pick up at your convenience: Nexium CC: Dr Patsi Sears, Dr Fabian Sharp

## 2012-05-19 ENCOUNTER — Other Ambulatory Visit: Payer: Self-pay | Admitting: Orthopedic Surgery

## 2012-05-28 ENCOUNTER — Encounter (HOSPITAL_BASED_OUTPATIENT_CLINIC_OR_DEPARTMENT_OTHER): Payer: Self-pay | Admitting: *Deleted

## 2012-05-28 NOTE — Progress Notes (Signed)
To [facility] at 1030-HG, EKG on arrival Npo after Mn-plans  to be 23 observation. Will take lexapro,wellbutrin with sip water that am-

## 2012-06-01 ENCOUNTER — Observation Stay (HOSPITAL_BASED_OUTPATIENT_CLINIC_OR_DEPARTMENT_OTHER)
Admission: RE | Admit: 2012-06-01 | Discharge: 2012-06-03 | Disposition: A | Discharge status: Home / Self Care | Payer: BC Managed Care – PPO | Source: Ambulatory Visit | Attending: Orthopedic Surgery | Admitting: Orthopedic Surgery

## 2012-06-01 ENCOUNTER — Encounter (HOSPITAL_BASED_OUTPATIENT_CLINIC_OR_DEPARTMENT_OTHER): Payer: Self-pay | Admitting: Anesthesiology

## 2012-06-01 ENCOUNTER — Other Ambulatory Visit: Payer: Self-pay

## 2012-06-01 ENCOUNTER — Encounter (HOSPITAL_BASED_OUTPATIENT_CLINIC_OR_DEPARTMENT_OTHER): Payer: Self-pay | Admitting: *Deleted

## 2012-06-01 ENCOUNTER — Encounter (HOSPITAL_COMMUNITY)
Admission: RE | Disposition: A | Discharge status: Home / Self Care | Payer: Self-pay | Source: Ambulatory Visit | Attending: Orthopedic Surgery

## 2012-06-01 ENCOUNTER — Ambulatory Visit (HOSPITAL_BASED_OUTPATIENT_CLINIC_OR_DEPARTMENT_OTHER): Payer: BC Managed Care – PPO | Admitting: Anesthesiology

## 2012-06-01 DIAGNOSIS — X58XXXA Exposure to other specified factors, initial encounter: Secondary | ICD-10-CM | POA: Insufficient documentation

## 2012-06-01 DIAGNOSIS — M25819 Other specified joint disorders, unspecified shoulder: Principal | ICD-10-CM | POA: Insufficient documentation

## 2012-06-01 DIAGNOSIS — M7540 Impingement syndrome of unspecified shoulder: Secondary | ICD-10-CM

## 2012-06-01 DIAGNOSIS — S43429A Sprain of unspecified rotator cuff capsule, initial encounter: Secondary | ICD-10-CM | POA: Insufficient documentation

## 2012-06-01 DIAGNOSIS — Z79899 Other long term (current) drug therapy: Secondary | ICD-10-CM | POA: Insufficient documentation

## 2012-06-01 DIAGNOSIS — G35 Multiple sclerosis: Secondary | ICD-10-CM | POA: Insufficient documentation

## 2012-06-01 HISTORY — DX: Urinary tract infection, site not specified: N39.0

## 2012-06-01 HISTORY — PX: SHOULDER ARTHROSCOPY WITH ROTATOR CUFF REPAIR AND SUBACROMIAL DECOMPRESSION: SHX5686

## 2012-06-01 SURGERY — SHOULDER ARTHROSCOPY WITH ROTATOR CUFF REPAIR AND SUBACROMIAL DECOMPRESSION
Anesthesia: General | Site: Shoulder | Laterality: Left | Wound class: Clean

## 2012-06-01 MED ORDER — EPINEPHRINE HCL 1 MG/ML IJ SOLN
INTRAMUSCULAR | Status: DC | PRN
  Administered 2012-06-01: 2 mg

## 2012-06-01 MED ORDER — EPHEDRINE SULFATE 50 MG/ML IJ SOLN
INTRAMUSCULAR | Status: DC | PRN
  Administered 2012-06-01 (×2): 10 mg via INTRAVENOUS

## 2012-06-01 MED ORDER — DEXTROSE IN LACTATED RINGERS 5 % IV SOLN
INTRAVENOUS | Status: DC
  Administered 2012-06-01 – 2012-06-02 (×2): via INTRAVENOUS

## 2012-06-01 MED ORDER — SUCCINYLCHOLINE CHLORIDE 20 MG/ML IJ SOLN
INTRAMUSCULAR | Status: DC | PRN
  Administered 2012-06-01: 120 mg via INTRAVENOUS

## 2012-06-01 MED ORDER — CEFAZOLIN SODIUM 1-5 GM-% IV SOLN
1.0000 g | Freq: Four times a day (QID) | INTRAVENOUS | Status: AC
  Administered 2012-06-01 – 2012-06-02 (×3): 1 g via INTRAVENOUS
  Filled 2012-06-01 (×3): qty 50

## 2012-06-01 MED ORDER — ONDANSETRON HCL 4 MG/2ML IJ SOLN: 4.0000 mg | Freq: Four times a day (QID) | INTRAMUSCULAR | Status: DC | PRN

## 2012-06-01 MED ORDER — BUPIVACAINE-EPINEPHRINE 0.5% -1:200000 IJ SOLN
INTRAMUSCULAR | Status: DC | PRN
  Administered 2012-06-01: 8 mL

## 2012-06-01 MED ORDER — METOCLOPRAMIDE HCL 10 MG PO TABS: 5.0000 mg | ORAL_TABLET | Freq: Three times a day (TID) | ORAL | Status: DC | PRN

## 2012-06-01 MED ORDER — SODIUM CHLORIDE 0.9 % IR SOLN
Status: DC | PRN
  Administered 2012-06-01: 9000 mL

## 2012-06-01 MED ORDER — CLONAZEPAM 0.5 MG PO TABS
0.5000 mg | ORAL_TABLET | Freq: Every evening | ORAL | Status: DC | PRN
  Administered 2012-06-01 – 2012-06-02 (×2): 0.5 mg via ORAL
  Filled 2012-06-01 (×2): qty 1

## 2012-06-01 MED ORDER — LACTATED RINGERS IV SOLN
INTRAVENOUS | Status: DC
  Administered 2012-06-01 (×3): via INTRAVENOUS
  Filled 2012-06-01: qty 1000

## 2012-06-01 MED ORDER — ONDANSETRON HCL 4 MG/2ML IJ SOLN
INTRAMUSCULAR | Status: DC | PRN
  Administered 2012-06-01: 4 mg via INTRAVENOUS

## 2012-06-01 MED ORDER — METOCLOPRAMIDE HCL 5 MG/ML IJ SOLN: 5.0000 mg | Freq: Three times a day (TID) | INTRAMUSCULAR | Status: DC | PRN

## 2012-06-01 MED ORDER — BACLOFEN 20 MG PO TABS
20.0000 mg | ORAL_TABLET | Freq: Two times a day (BID) | ORAL | Status: DC
  Administered 2012-06-01 – 2012-06-03 (×4): 20 mg via ORAL
  Filled 2012-06-01 (×5): qty 1

## 2012-06-01 MED ORDER — CEFAZOLIN SODIUM-DEXTROSE 2-3 GM-% IV SOLR
INTRAVENOUS | Status: DC | PRN
  Administered 2012-06-01: 2 g via INTRAVENOUS

## 2012-06-01 MED ORDER — BUPROPION HCL ER (XL) 300 MG PO TB24
300.0000 mg | ORAL_TABLET | Freq: Every day | ORAL | Status: DC
  Administered 2012-06-02 – 2012-06-03 (×2): 300 mg via ORAL
  Filled 2012-06-01 (×3): qty 1

## 2012-06-01 MED ORDER — SODIUM CHLORIDE 0.9 % IR SOLN
Status: DC | PRN
  Administered 2012-06-01: 14:00:00

## 2012-06-01 MED ORDER — ONDANSETRON HCL 4 MG PO TABS: 4.0000 mg | ORAL_TABLET | Freq: Four times a day (QID) | ORAL | Status: DC | PRN

## 2012-06-01 MED ORDER — FENTANYL CITRATE 0.05 MG/ML IJ SOLN
INTRAMUSCULAR | Status: DC | PRN
  Administered 2012-06-01: 25 ug via INTRAVENOUS
  Administered 2012-06-01: 50 ug via INTRAVENOUS
  Administered 2012-06-01: 25 ug via INTRAVENOUS

## 2012-06-01 MED ORDER — ESCITALOPRAM OXALATE 10 MG PO TABS
10.0000 mg | ORAL_TABLET | Freq: Every day | ORAL | Status: DC
  Administered 2012-06-02 – 2012-06-03 (×2): 10 mg via ORAL
  Filled 2012-06-01 (×3): qty 1

## 2012-06-01 MED ORDER — PROMETHAZINE HCL 25 MG/ML IJ SOLN
6.2500 mg | INTRAMUSCULAR | Status: DC | PRN
  Filled 2012-06-01: qty 1

## 2012-06-01 MED ORDER — FENTANYL CITRATE 0.05 MG/ML IJ SOLN
25.0000 ug | INTRAMUSCULAR | Status: DC | PRN
  Administered 2012-06-01: 50 ug via INTRAVENOUS
  Filled 2012-06-01: qty 1

## 2012-06-01 MED ORDER — LIDOCAINE HCL (CARDIAC) 20 MG/ML IV SOLN
INTRAVENOUS | Status: DC | PRN
  Administered 2012-06-01: 75 mg via INTRAVENOUS

## 2012-06-01 MED ORDER — MEPERIDINE HCL 50 MG PO TABS
50.0000 mg | ORAL_TABLET | ORAL | Status: DC | PRN
  Administered 2012-06-01 – 2012-06-03 (×7): 50 mg via ORAL
  Filled 2012-06-01 (×7): qty 1

## 2012-06-01 MED ORDER — MIDAZOLAM HCL 2 MG/2ML IJ SOLN
1.0000 mg | Freq: Two times a day (BID) | INTRAMUSCULAR | Status: DC | PRN
  Administered 2012-06-01 (×2): 1 mg via INTRAVENOUS
  Filled 2012-06-01: qty 1

## 2012-06-01 MED ORDER — NITROFURANTOIN MACROCRYSTAL 50 MG PO CAPS
50.0000 mg | ORAL_CAPSULE | Freq: Four times a day (QID) | ORAL | Status: DC
  Administered 2012-06-02 – 2012-06-03 (×2): 50 mg via ORAL
  Filled 2012-06-01 (×10): qty 1

## 2012-06-01 MED ORDER — PROPOFOL 10 MG/ML IV BOLUS
INTRAVENOUS | Status: DC | PRN
  Administered 2012-06-01: 180 mg via INTRAVENOUS

## 2012-06-01 MED ORDER — MELATONIN 5 MG PO CAPS: 5.0000 mg | ORAL_CAPSULE | Freq: Every evening | ORAL | Status: DC | PRN

## 2012-06-01 MED ORDER — POVIDONE-IODINE 7.5 % EX SOLN
Freq: Once | CUTANEOUS | Status: DC
  Filled 2012-06-01: qty 118

## 2012-06-01 MED ORDER — DEXAMETHASONE SODIUM PHOSPHATE 4 MG/ML IJ SOLN
INTRAMUSCULAR | Status: DC | PRN
  Administered 2012-06-01: 10 mg via INTRAVENOUS

## 2012-06-01 MED ORDER — PANTOPRAZOLE SODIUM 40 MG PO TBEC
80.0000 mg | DELAYED_RELEASE_TABLET | Freq: Every day | ORAL | Status: DC
  Administered 2012-06-02 – 2012-06-03 (×2): 80 mg via ORAL
  Filled 2012-06-01 (×2): qty 2

## 2012-06-01 SURGICAL SUPPLY — 74 items
APL SKNCLS STERI-STRIP NONHPOA (GAUZE/BANDAGES/DRESSINGS)
BENZOIN TINCTURE PRP APPL 2/3 (GAUZE/BANDAGES/DRESSINGS) IMPLANT
BLADE 4.2CUDA (BLADE) ×2 IMPLANT
BLADE CUDA 4.2 (BLADE) IMPLANT
BLADE CUDA 5.5 (BLADE) IMPLANT
BLADE CUDA SHAVER 3.5 (BLADE) IMPLANT
BLADE CUTTER GATOR 3.5 (BLADE) IMPLANT
BLADE FLAT COURSE (BLADE) IMPLANT
BLADE GREAT WHITE 4.2 (BLADE) IMPLANT
BLADE SURG 10 STRL SS (BLADE) ×2 IMPLANT
BLADE SURG 15 STRL LF DISP TIS (BLADE) ×1 IMPLANT
BLADE SURG 15 STRL SS (BLADE) ×2
BUR OVAL 4.0 (BURR) ×2 IMPLANT
CANISTER SUCT LVC 12 LTR MEDI- (MISCELLANEOUS) ×2 IMPLANT
CANISTER SUCTION 1200CC (MISCELLANEOUS) IMPLANT
CANISTER SUCTION 2500CC (MISCELLANEOUS) ×2 IMPLANT
CLOTH BEACON ORANGE TIMEOUT ST (SAFETY) ×2 IMPLANT
DRAPE LG THREE QUARTER DISP (DRAPES) ×2 IMPLANT
DRAPE SHOULDER BEACH CHAIR (DRAPES) ×2 IMPLANT
DRAPE U-SHAPE 47X51 STRL (DRAPES) ×2 IMPLANT
DRSG ADAPTIC 3X8 NADH LF (GAUZE/BANDAGES/DRESSINGS) IMPLANT
DRSG EMULSION OIL 3X3 NADH (GAUZE/BANDAGES/DRESSINGS) ×2 IMPLANT
DRSG PAD ABDOMINAL 8X10 ST (GAUZE/BANDAGES/DRESSINGS) ×2 IMPLANT
DURAPREP 26ML APPLICATOR (WOUND CARE) ×2 IMPLANT
ELECT MENISCUS 165MM 90D (ELECTRODE) IMPLANT
ELECT REM PT RETURN 9FT ADLT (ELECTROSURGICAL) ×2
ELECTRODE REM PT RTRN 9FT ADLT (ELECTROSURGICAL) ×1 IMPLANT
GLOVE BIO SURGEON STRL SZ7.5 (GLOVE) ×2 IMPLANT
GLOVE BIOGEL PI IND STRL 8 (GLOVE) ×1 IMPLANT
GLOVE BIOGEL PI INDICATOR 8 (GLOVE) ×1
GLOVE ECLIPSE 8.0 STRL XLNG CF (GLOVE) ×4 IMPLANT
GLOVE INDICATOR 6.5 STRL GRN (GLOVE) ×2 IMPLANT
GLOVE INDICATOR 7.0 STRL GRN (GLOVE) ×2 IMPLANT
GLOVE INDICATOR 8.0 STRL GRN (GLOVE) ×4 IMPLANT
GOWN PREVENTION PLUS LG XLONG (DISPOSABLE) ×2 IMPLANT
GOWN STRL REIN XL XLG (GOWN DISPOSABLE) ×4 IMPLANT
IV NS IRRIG 3000ML ARTHROMATIC (IV SOLUTION) ×6 IMPLANT
NDL SAFETY ECLIPSE 18X1.5 (NEEDLE) ×1 IMPLANT
NEEDLE 1/2 CIR CATGUT .05X1.09 (NEEDLE) IMPLANT
NEEDLE HYPO 18GX1.5 BLUNT FILL (NEEDLE) ×2 IMPLANT
NEEDLE HYPO 18GX1.5 SHARP (NEEDLE) ×2
NEEDLE HYPO 22GX1.5 SAFETY (NEEDLE) ×2 IMPLANT
NS IRRIG 500ML POUR BTL (IV SOLUTION) ×2 IMPLANT
PACK ARTHROSCOPY DSU (CUSTOM PROCEDURE TRAY) ×2 IMPLANT
PACK BASIN DAY SURGERY FS (CUSTOM PROCEDURE TRAY) ×2 IMPLANT
PENCIL BUTTON HOLSTER BLD 10FT (ELECTRODE) ×2 IMPLANT
SET ARTHROSCOPY TUBING (MISCELLANEOUS) ×2
SET ARTHROSCOPY TUBING LN (MISCELLANEOUS) ×1 IMPLANT
SLING ARM FOAM STRAP MED (SOFTGOODS) ×2 IMPLANT
SLING ARM IMMOBILIZER LRG (SOFTGOODS) IMPLANT
SPONGE GAUZE 4X4 12PLY (GAUZE/BANDAGES/DRESSINGS) ×2 IMPLANT
SPONGE SURGIFOAM ABS GEL 100 (HEMOSTASIS) IMPLANT
STAPLER VISISTAT 35W (STAPLE) IMPLANT
STRIP CLOSURE SKIN 1/2X4 (GAUZE/BANDAGES/DRESSINGS) IMPLANT
SUCTION FRAZIER TIP 10 FR DISP (SUCTIONS) ×2 IMPLANT
SUT BONE WAX W31G (SUTURE) IMPLANT
SUT ETHIBOND GREEN BRAID 0S 4 (SUTURE) IMPLANT
SUT ETHIBOND NAB CT1 #1 30IN (SUTURE) IMPLANT
SUT ETHILON 4 0 PS 2 18 (SUTURE) ×2 IMPLANT
SUT VIC AB 0 CT1 36 (SUTURE) IMPLANT
SUT VIC AB 1 CT1 36 (SUTURE) ×2 IMPLANT
SUT VIC AB 2-0 CT1 27 (SUTURE) ×2
SUT VIC AB 2-0 CT1 TAPERPNT 27 (SUTURE) ×1 IMPLANT
SUT VIC AB 3-0 SH 27 (SUTURE)
SUT VIC AB 3-0 SH 27X BRD (SUTURE) IMPLANT
SUT VICRYL 4-0 PS2 18IN ABS (SUTURE) IMPLANT
SYR BULB IRRIGATION 50ML (SYRINGE) ×2 IMPLANT
SYRINGE 10CC LL (SYRINGE) ×2 IMPLANT
TAPE CLOTH SURG 6X10 WHT LF (GAUZE/BANDAGES/DRESSINGS) ×2 IMPLANT
TAPE HYPAFIX 6X30 (GAUZE/BANDAGES/DRESSINGS) IMPLANT
TOWEL OR 17X24 6PK STRL BLUE (TOWEL DISPOSABLE) ×2 IMPLANT
TUBE CONNECTING 12X1/4 (SUCTIONS) ×2 IMPLANT
WAND 90 DEG TURBOVAC W/CORD (SURGICAL WAND) ×2 IMPLANT
WATER STERILE IRR 500ML POUR (IV SOLUTION) ×2 IMPLANT

## 2012-06-01 NOTE — Brief Op Note (Signed)
06/01/2012  2:59 PM  PATIENT:  [name]  56 y.o. female  PRE-OPERATIVE DIAGNOSIS:  painful shoulder with small complete rotator cuff tear on magnetic resonance imaging  POST-OPERATIVE DIAGNOSIS:  complete rotator cuff tear on magnetic resonance imaging  PROCEDURE:  Procedure(s) (LRB) with comments: SHOULDER ARTHROSCOPY WITH ROTATOR CUFF REPAIR AND SUBACROMIAL DECOMPRESSION (Left) - LEFT SHOULDER ARTHROSCOPY WITH SUBACROMIAL DECOMPRESION AND DEBRIDEMENT OF PARTIAL ROTATOR CUFF TEAR  SURGEON:  Surgeon(s) and Role:    * Drucilla Schmidt, MD - Primary  PHYSICIAN ASSISTANT:   ASSISTANTS: Skip Mayer PAC   ANESTHESIA:   regional and general  EBL:  Total I/O In: 1000 [I.V.:1000] Out: -   BLOOD ADMINISTERED:none  DRAINS: none   LOCAL MEDICATIONS USED:  MARCAINE     SPECIMEN:  No Specimen  DISPOSITION OF SPECIMEN:  N/A  COUNTS:  YES  TOURNIQUET:  * No tourniquets in log *  DICTATION: .Other Dictation: Dictation Number [id number]  PLAN OF CARE: Admit for overnight observation  PATIENT DISPOSITION:  PACU - hemodynamically stable.   Delay start of Pharmacological VTE agent (>24hrs) due to surgical blood loss or risk of bleeding: yes

## 2012-06-01 NOTE — H&P (Signed)
[name] is an 56 y.o. female.   Chief Complaint:painful left shoulder HPI: MRI demonstrates torn rotator cuff  Past Medical History  Diagnosis Date  . MS (multiple sclerosis)   . Rhinitis   . Varicose veins   . Superficial phlebitis   . Mood disorder   . Depressive reaction   . Diverticulitis   . Diverticulosis   . Anemia   . Gastropathy 2013    reactive  . Frequent UTI LAST 7/13    Past Surgical History  Procedure Date  . Tonsillectomy and adenoidectomy   . Bladder surgery 2008    Tack  . Abdominal hysterectomy 2008    Family History  Problem Relation Age of Onset  . Lung cancer Father   . Colon cancer Neg Hx    Social History:  reports that she has never smoked. She has never used smokeless tobacco. She reports that she drinks alcohol. She reports that she does not use illicit drugs.  Allergies:  Allergies  Allergen Reactions  . Codeine     REACTION: itching  . Hydrocodone Itching  . Meclizine Hcl     REACTION: Psychotic reaction  . Oxycodone Itching  . Prilosec (Omeprazole Magnesium) Hives  . Tramadol Hcl Itching    Medications Prior to Admission  Medication Sig Dispense Refill  . baclofen (LIORESAL) 10 MG tablet Take 20 mg by mouth 2 (two) times daily.       Marland Kitchen buPROPion (WELLBUTRIN XL) 300 MG 24 hr tablet Take 300 mg by mouth daily. Dose increased to 450 daily      . celecoxib (CELEBREX) 200 MG capsule Take 200 mg by mouth 2 (two) times daily.       . Cholecalciferol (VITAMIN D) 2000 UNITS CAPS Take by mouth daily.       . clonazePAM (KLONOPIN) 0.5 MG tablet Take 0.5 mg by mouth at bedtime as needed.      Marland Kitchen escitalopram (LEXAPRO) 10 MG tablet TAKE ONE-HALF TABLET BY MOUTH DAILY FOR 2 WEEKS, THEN INCREASE TO ONE WHOLE TABLET DAILY THEREAFTER  30 tablet  5  . natalizumab (TYSABRI) 300 MG/15ML injection Inject into the vein every 30 (thirty) days.       . nitrofurantoin (MACRODANTIN) 50 MG capsule Take 50 mg by mouth 4 (four) times daily.      Marland Kitchen  esomeprazole (NEXIUM) 40 MG capsule Take 40 mg by mouth daily. States no longer takes this      . Melatonin 5 MG CAPS Take by mouth at bedtime as needed.          Results for orders placed during the hospital encounter of 06/01/12 (from the past 48 hour(s))  POCT HEMOGLOBIN-HEMACUE     Status: Normal   Collection Time   06/01/12 11:39 AM      Component Value Range Comment   Hemoglobin 13.1  12.0 - 15.0 g/dL    No results found.  ROS  Blood pressure 145/76, pulse 65, temperature 97.3 F (36.3 C), temperature source Oral, resp. rate 18, height 5\' 6"  (1.676 m), weight 73.936 kg (163 lb), SpO2 98.00%. Physical Exam  Constitutional: She is oriented to person, place, and time. She appears well-developed and well-nourished.  HENT:  Head: Normocephalic and atraumatic.  Right Ear: External ear normal.  Left Ear: External ear normal.  Nose: Nose normal.  Mouth/Throat: Oropharynx is clear and moist.  Eyes: Conjunctivae normal and EOM are normal. Pupils are equal, round, and reactive to light.  Neck: Normal range of  motion. Neck supple.  Cardiovascular: Normal rate, regular rhythm, normal heart sounds and intact distal pulses.   Respiratory: Effort normal and breath sounds normal.  GI: Soft. Bowel sounds are normal.  Musculoskeletal:       She has had an interscalene block lt shoulder  Neurological: She is alert and oriented to person, place, and time.       She has MS with diffuse neurologic changes  Skin: Skin is warm and dry.  Psychiatric: She has a normal mood and affect. Her behavior is normal. Judgment and thought content normal.     Assessment/Plan Torn rotator cuff lt shoulder Left shoulder arthroscopy with SAD and mini-open rotator cuff repair  Egan Berkheimer P 06/01/2012, 12:47 PM

## 2012-06-01 NOTE — Anesthesia Procedure Notes (Addendum)
Anesthesia Regional Block:  Interscalene brachial plexus block  Pre-Anesthetic Checklist: ,, timeout performed, Correct Patient, Correct Site, Correct Laterality, Correct Procedure, Correct Position, site marked, Risks and benefits discussed,  Surgical consent,  Pre-op evaluation,  At surgeon's request and post-op pain management   Prep: chloraprep       Needles:  Injection technique: Single-shot  Needle Type: Echogenic Needle          Additional Needles:  Procedures: ultrasound guided (picture in chart) Interscalene brachial plexus block Narrative:  Injection made incrementally with aspirations every 5 mL.  Performed by: Personally  Anesthesiologist: Eilene Ghazi MD  Additional Notes: Patient tolerated the procedure well without complications  Interscalene brachial plexus block Procedure Name: Intubation Date/Time: 06/01/2012 1:00 PM Performed by: Fran Lowes Pre-anesthesia Checklist: Patient identified, Emergency Drugs available, Suction available and Patient being monitored Patient Re-evaluated:Patient Re-evaluated prior to inductionOxygen Delivery Method: Circle System Utilized Preoxygenation: Pre-oxygenation with 100% oxygen Intubation Type: IV induction Ventilation: Mask ventilation without difficulty Laryngoscope Size: Mac and 4 Grade View: Grade I Tube type: Oral Tube size: 7.0 mm Number of attempts: 1 Airway Equipment and Method: stylet,  oral airway and LTA kit utilized Placement Confirmation: ETT inserted through vocal cords under direct vision,  positive ETCO2 and breath sounds checked- equal and bilateral Secured at: 21 cm Tube secured with: Tape Dental Injury: Teeth and Oropharynx as per pre-operative assessment

## 2012-06-01 NOTE — Transfer of Care (Signed)
Immediate Anesthesia Transfer of Care Note  Patient: [name]  Procedure(s) Performed: Procedure(s) (LRB): SHOULDER ARTHROSCOPY WITH ROTATOR CUFF REPAIR AND SUBACROMIAL DECOMPRESSION (Left)  Patient Location: Patient transported to PACU with oxygen via face mask at 4 Liters / Min  Anesthesia Type: General  Level of Consciousness: awake and alert   Airway & Oxygen Therapy: Patient Spontanous Breathing and Patient connected to face mask oxygen  Post-op Assessment: Report given to PACU RN and Post -op Vital signs reviewed and stable  Post vital signs: Reviewed and stable  Dentition: Teeth and oropharynx remain in pre-op condition  Complications: No apparent anesthesia complications

## 2012-06-01 NOTE — Progress Notes (Signed)
Report given to Cathlean Cower RN room [phone]

## 2012-06-01 NOTE — Progress Notes (Signed)
Pt refused taking nitrofurantoin  Stating that she took this medication this AM before surgery and she always takes it in the AM. Pt is alert, oriented and stable. Will continue to monitor.

## 2012-06-01 NOTE — Anesthesia Preprocedure Evaluation (Signed)
Anesthesia Evaluation  Patient identified by MRN, date of birth, ID band Patient awake    Reviewed: Allergy & Precautions, H&P , NPO status , Patient's Chart, lab work & pertinent test results  Airway Mallampati: II TM Distance: <3 FB Neck ROM: Full    Dental No notable dental hx.    Pulmonary neg pulmonary ROS,  breath sounds clear to auscultation  Pulmonary exam normal       Cardiovascular Rhythm:Regular Rate:Normal     Neuro/Psych negative neurological ROS  negative psych ROS   GI/Hepatic Neg liver ROS, GERD-  Medicated,  Endo/Other  negative endocrine ROS  Renal/GU negative Renal ROS  negative genitourinary   Musculoskeletal   Abdominal   Peds negative pediatric ROS (+)  Hematology negative hematology ROS (+)   Anesthesia Other Findings   Reproductive/Obstetrics negative OB ROS                           Anesthesia Physical Anesthesia Plan  ASA: III  Anesthesia Plan: General   Post-op Pain Management:    Induction: Intravenous  Airway Management Planned: Oral ETT  Additional Equipment:   Intra-op Plan:   Post-operative Plan: Extubation in OR  Informed Consent: I have reviewed the patients History and Physical, chart, labs and discussed the procedure including the risks, benefits and alternatives for the proposed anesthesia with the patient or authorized representative who has indicated his/her understanding and acceptance.   Dental advisory given  Plan Discussed with: CRNA and Surgeon  Anesthesia Plan Comments:         Anesthesia Quick Evaluation

## 2012-06-02 ENCOUNTER — Encounter (HOSPITAL_BASED_OUTPATIENT_CLINIC_OR_DEPARTMENT_OTHER): Payer: Self-pay | Admitting: Orthopedic Surgery

## 2012-06-02 MED ORDER — MORPHINE SULFATE 2 MG/ML IJ SOLN
1.0000 mg | INTRAMUSCULAR | Status: DC | PRN
  Administered 2012-06-02 – 2012-06-03 (×6): 2 mg via INTRAVENOUS
  Filled 2012-06-02 (×5): qty 1

## 2012-06-02 MED ORDER — DIPHENHYDRAMINE HCL 25 MG PO CAPS
25.0000 mg | ORAL_CAPSULE | ORAL | Status: DC | PRN
  Administered 2012-06-02: 25 mg via ORAL
  Filled 2012-06-02: qty 1

## 2012-06-02 MED ORDER — MORPHINE SULFATE 2 MG/ML IJ SOLN
INTRAMUSCULAR | Status: AC
  Filled 2012-06-02: qty 1

## 2012-06-02 NOTE — Op Note (Signed)
[name], [name] NO.:  [id number]  MEDICAL RECORD NO.:  [id number]  LOCATION:  1516                         FACILITY:  [facility]  PHYSICIAN:  Marlowe Kays, M.D.  DATE OF BIRTH:  Nov 18, 1955  DATE OF PROCEDURE:  06/01/2012 DATE OF DISCHARGE:                              OPERATIVE REPORT   PREOPERATIVE DIAGNOSIS:  Chronic impingement syndrome, left shoulder with suspected full-thickness rotator cuff tear.  POSTOPERATIVE DIAGNOSIS:  Chronic impingement syndrome, left shoulder with partial rotator cuff tear.  OPERATION:  Left shoulder arthroscopy with subacromial decompression and inspection of the rotator cuff.  SURGEON:  Marlowe Kays, MD  ASSISTANT:  Skip Mayer, PA-C  ANESTHESIA:  Interscalene block followed by general anesthetic.  PATHOLOGY AND JUSTIFICATION FOR PROCEDURE:  Severe left shoulder pain. She also has MS, however, which may or may not be playing a part in her pain pattern.  She had impingement type bony anatomy and it was felt by the radiologist to be a poorly defined defect of the anterior to mid supraspinatus tendon at the greater tuberosity "likely representing a full-thickness tear measuring about 1.5 x 1 cm.  See operative description below for additional details.  Ms Su Hilt' assistance was required to help hold the arm, move the arm, help with instrumentation, and also in this case to provide additional clinical judgment.  PROCEDURE:  Prophylactic antibiotics, satisfactory general anesthesia preceded by an interscalene block, placed on the sliding frame.  Left shoulder girdle was prepped with DuraPrep, draped in sterile field. Time-out performed.  I marked out the anatomy of the shoulder joint and infiltrated the posterior andlateral portal sites and the subacromial space with Marcaine with adrenaline.  Through posterior soft spot portal, I atraumatically entered the glenohumeral joint.  There was some evidence of chronic  vascular response indicating some chronic irritation.  On inspection of the joint, it was normal and I then redirected the scope to the subacromial space and through lateral portal introduced a 4.2 shaver.  As depicted on the MRI, the subacromial bursa was quite thick.  Also as in the glenohumeral joint, her tissues were very friable with bleeding which I irradicated with the 90-degree ArthroCare vaporizer.  I then removed soft tissue from the undersurface of the acromion with it and followed this with 4 mm oval bur, burring down the subacromial space.  Went back and forth between the vaporizer and the bur until we had nice decompression.  I then found the area, which apparently what was depicted on the MRI but the rotator cuff was quite serated and I tried to smooth this down as much as possible with the 4.2 shaver for better visualization.  I then probed this area thoroughly and with arm rotation by Ms. Su Hilt.  We were unable to find any definite evidence for anything close to a full-thickness rotator cuff tear.  She also seconded my conclusion that what we are probably dealing with was a partial thickness tear with lot of superficial bursal disruption.  We checked to be sure that the impingement had been corrected, all fluid possible was removed.  I then closed the 2 portals with 4-0 nylon, Betadine, Adaptic, dry sterile dressing, shoulder immobilizer applied.  At the time of  this dictation, she was on her way to recovery room in satisfactory condition with no known complications.          ______________________________ Marlowe Kays, M.D.     JA/MEDQ  D:  06/01/2012  T:  06/02/2012  Job:  [id number]

## 2012-06-02 NOTE — Progress Notes (Signed)
Patient ambulated in hallway with spouse assistance. Tolerated ambulation without issue.

## 2012-06-02 NOTE — Progress Notes (Signed)
Utilization review completed.  

## 2012-06-02 NOTE — Progress Notes (Signed)
Nutrition Brief Note  Patient identified on the Malnutrition Screening Tool (MST) Report  Body mass index is 25.82 kg/(m^2). Pt meets criteria for overweight based on current BMI.   Current diet order is regular, patient is consuming approximately 100% of meals at this time that pt's husband brings in from fast food places like [employer]. Labs and medications reviewed. Pt reports eating well PTA, 3 meals/day, no recent changes in weight.   No nutrition interventions warranted at this time. If nutrition issues arise, please consult RD.   Levon Hedger MS, RD, LDN [phone] Pager [phone] After Hours Pager

## 2012-06-02 NOTE — Progress Notes (Signed)
Patient ID: [name], female   DOB: 07-12-1956, 56 y.o.   MRN: [id number] Se is requiring iv pain medicine so will need to stay another day unless this changes.  Sensation and motor function have returned in hand.

## 2012-06-02 NOTE — Progress Notes (Signed)
Pt expressed high pain levels after going to the bathroom and changing her gown after it slightly fell in toilet water.  I notified the MD on call and received telephone orders. I read the orders back to the MD, Avel Peace, twice.  Pt is resting, will continue to monitor.

## 2012-06-03 MED ORDER — METHOCARBAMOL 500 MG PO TABS: 500.0000 mg | ORAL_TABLET | Freq: Four times a day (QID) | ORAL | Status: DC

## 2012-06-03 MED ORDER — MEPERIDINE HCL 50 MG PO TABS: 50.0000 mg | ORAL_TABLET | ORAL | Status: DC | PRN

## 2012-06-03 MED ORDER — KETOROLAC TROMETHAMINE 10 MG PO TABS: 10.0000 mg | ORAL_TABLET | Freq: Four times a day (QID) | ORAL | Status: DC | PRN

## 2012-06-03 NOTE — Progress Notes (Addendum)
Patient discharge home with husband, alert and oriented, discharge instructions given, patient verbalize understanding of orders given, patient in stable condition at this time

## 2012-06-03 NOTE — Progress Notes (Signed)
Patient ID: [name], female   DOB: 1956-02-18, 56 y.o.   MRN: [id number] She feels much better this AM and ready to go home.  Dressing removed and bandaids applied.  Instructions given--office 2 wks post op.

## 2012-06-03 NOTE — Anesthesia Postprocedure Evaluation (Signed)
  Anesthesia Post-op Note  Patient: [name]  Procedure(s) Performed: Procedure(s) (LRB): SHOULDER ARTHROSCOPY WITH ROTATOR CUFF REPAIR AND SUBACROMIAL DECOMPRESSION (Left)  Patient Location: PACU  Anesthesia Type: GA combined with regional for post-op pain  Level of Consciousness: awake and alert   Airway and Oxygen Therapy: Patient Spontanous Breathing  Post-op Pain: mild  Post-op Assessment: Post-op Vital signs reviewed, Patient's Cardiovascular Status Stable, Respiratory Function Stable, Patent Airway and No signs of Nausea or vomiting  Post-op Vital Signs: stable  Complications: No apparent anesthesia complications

## 2012-06-05 NOTE — Discharge Summary (Signed)
NAMELAKEYIA, DROUIN NO.:  [id number]  MEDICAL RECORD NO.:  [id number]  LOCATION:  1516                         FACILITY:  [facility]  PHYSICIAN:  Marlowe Kays, M.D.  DATE OF BIRTH:  29-Sep-1955  DATE OF ADMISSION:  06/01/2012 DATE OF DISCHARGE:  06/03/2012                              DISCHARGE SUMMARY   ADMITTING DIAGNOSIS:  Impingement syndrome, left shoulder with suspected full-thickness rotator cuff tear.  DISCHARGE DIAGNOSIS:  Impingement syndrome, left shoulder with partial rotator cuff tear.  OPERATION:  Left shoulder arthroscopy with shaving of rotator cuff and subacromial decompression.  SUMMARY:  This woman has had progressive pain in her left shoulder with an MRI demonstrating what appeared to be a possible full-thickness rotator cuff tear, but this was equivocal according to the radiologist evaluation.  Her pain was significant enough, however, but she was admitted at this time for the above-mentioned surgery.  She had numerous comorbidities with the major one being multiple sclerosis.  At surgery, we did not find a complete rotator cuff tear, but significant bursal surface partial tear.  The above-mentioned surgical procedure was performed without complication.  Pain control was a problem postoperatively and her MS also delayed her ability to get around and manage herself.  At the time of discharge, she was comfortable on p.o. medications.  She was discharged on Demerol and Robaxin.  Two portal sites were healing nicely on wound inspection before discharge.  She was given instructions on bathing, dressing and undressing and was to return to see me 2 weeks from surgery for suture removal.  Condition at discharge was stable and improved.          ______________________________ Marlowe Kays, M.D.     JA/MEDQ  D:  06/04/2012  T:  06/05/2012  Job:  [id number]

## 2012-11-23 ENCOUNTER — Other Ambulatory Visit: Payer: Medicare Other

## 2012-11-25 LAB — HM MAMMOGRAPHY: HM Mammogram: NORMAL

## 2012-12-01 ENCOUNTER — Encounter: Payer: Medicare Other | Admitting: Internal Medicine

## 2013-02-14 ENCOUNTER — Other Ambulatory Visit (INDEPENDENT_AMBULATORY_CARE_PROVIDER_SITE_OTHER): Payer: BC Managed Care – PPO

## 2013-02-14 DIAGNOSIS — Z0000 Encounter for general adult medical examination without abnormal findings: Secondary | ICD-10-CM

## 2013-02-14 LAB — CBC WITH DIFFERENTIAL/PLATELET
Eosinophils Relative: 3.2 % (ref 0.0–5.0)
HCT: 36.1 % (ref 36.0–46.0)
Lymphocytes Relative: 40.1 % (ref 12.0–46.0)
Lymphs Abs: 2.6 10*3/uL (ref 0.7–4.0)
Monocytes Relative: 7.7 % (ref 3.0–12.0)
Platelets: 211 10*3/uL (ref 150.0–400.0)
WBC: 6.6 10*3/uL (ref 4.5–10.5)

## 2013-02-14 LAB — BASIC METABOLIC PANEL
BUN: 12 mg/dL (ref 6–23)
Calcium: 9.2 mg/dL (ref 8.4–10.5)
GFR: 94.19 mL/min (ref >60.00)
Potassium: 4 mEq/L (ref 3.5–5.1)
Sodium: 142 mEq/L (ref 135–145)

## 2013-02-14 LAB — LIPID PANEL
Cholesterol: 189 mg/dL (ref 0–200)
HDL: 53.7 mg/dL (ref >39.00)
LDL Cholesterol: 109 mg/dL — ABNORMAL HIGH (ref 0–99)
Triglycerides: 130 mg/dL (ref 0.0–149.0)
VLDL: 26 mg/dL (ref 0.0–40.0)

## 2013-02-14 LAB — HEPATIC FUNCTION PANEL
ALT: 23 U/L (ref 0–35)
AST: 18 U/L (ref 0–37)
Albumin: 4.1 g/dL (ref 3.5–5.2)
Total Bilirubin: 0.7 mg/dL (ref 0.3–1.2)
Total Protein: 6.7 g/dL (ref 6.0–8.3)

## 2013-02-21 ENCOUNTER — Ambulatory Visit (INDEPENDENT_AMBULATORY_CARE_PROVIDER_SITE_OTHER): Payer: BC Managed Care – PPO | Admitting: Internal Medicine

## 2013-02-21 ENCOUNTER — Encounter: Payer: Self-pay | Admitting: Internal Medicine

## 2013-02-21 VITALS — BP 110/80 | HR 76 | Temp 98.1°F | Ht 65.75 in | Wt 161.0 lb

## 2013-02-21 DIAGNOSIS — IMO0002 Reserved for concepts with insufficient information to code with codable children: Secondary | ICD-10-CM

## 2013-02-21 DIAGNOSIS — N398 Other specified disorders of urinary system: Secondary | ICD-10-CM

## 2013-02-21 DIAGNOSIS — R1314 Dysphagia, pharyngoesophageal phase: Secondary | ICD-10-CM

## 2013-02-21 DIAGNOSIS — Z780 Asymptomatic menopausal state: Secondary | ICD-10-CM

## 2013-02-21 DIAGNOSIS — G35 Multiple sclerosis: Secondary | ICD-10-CM

## 2013-02-21 DIAGNOSIS — N390 Urinary tract infection, site not specified: Secondary | ICD-10-CM

## 2013-02-21 DIAGNOSIS — Z0000 Encounter for general adult medical examination without abnormal findings: Secondary | ICD-10-CM

## 2013-02-21 DIAGNOSIS — Z8262 Family history of osteoporosis: Secondary | ICD-10-CM | POA: Insufficient documentation

## 2013-02-21 NOTE — Progress Notes (Signed)
Chief Complaint  Patient presents with  . Annual Exam    HPI: Patient comes in today for Preventive Health Care visit  Last ov was 6 13 for uti Has   Gi gu  dysfunction from the MS feels like  Morning sickness.   Back to normal  MS.  Balance legs weak  Not using cane as often. Cramps. Fatigue ? Low blood  Using miralax increasing bran.   For constipation and bowel dysfunction [facility] has evaluated and she is now on I/O caths bid and antibiotic at night for prevnetion.  No recent falling  Has fatigue  From ms  Also after eats in am   ROS:  GEN/ HEENT: No fever, significant weight changes sweats headaches vision problems hearing changes, CV/ PULM; No chest pain shortness of breath cough, syncope,edema  change in exercise tolerance. GI /GU: No adominal pain, vomiting, change in bowel habits. No blood in the stool. No significant GU symptoms. SKIN/HEME: ,no acute skin rashes suspicious lesions or bleeding. No lymphadenopathy, nodules, masses.  NEURO/ PSYCH:  No neurologic signs such as weakness numbness. No depression anxiety. IMM/ Allergy: No unusual infections.  Allergy .   REST of 12 system review negative except as per HPI   Past Medical History  Diagnosis Date  . MS (multiple sclerosis)   . Rhinitis   . Varicose veins   . Superficial phlebitis   . Mood disorder   . Depressive reaction   . Diverticulitis   . Diverticulosis   . Anemia   . Gastropathy 2013    reactive  . Frequent UTI LAST 7/13  . Blood in stool, frank 12/01/2011   .spsur  Family History  Problem Relation Age of Onset  . Lung cancer Father   . Colon cancer Neg Hx     History   Social History  . Marital Status: Married    Spouse Name: N/A    Number of Children: 4  . Years of Education: N/A   Occupational History  . Disability    Social History Main Topics  . Smoking status: Never Smoker   . Smokeless tobacco: Never Used  . Alcohol Use: Yes     Comment: occ  . Drug Use: No  .  Sexually Active: None   Other Topics Concern  . None   Social History Narrative   Unable to volunteer    Now on disability   Children now out of home   Daily caffeine     Outpatient Encounter Prescriptions as of 02/21/2013  Medication Sig Dispense Refill  . Alum Hydroxide-Mag Carbonate (GAVISCON PO) Take by mouth.      . Ascorbic Acid (VITAMIN C PO) Take 2,000 Units by mouth.      . Aspirin Effervescent (ALKA-SELTZER PO) Take by mouth.      . baclofen (LIORESAL) 10 MG tablet Take 20 mg by mouth 2 (two) times daily.       Marland Kitchen buPROPion (WELLBUTRIN XL) 150 MG 24 hr tablet Take 450 mg by mouth daily.      . Cholecalciferol (VITAMIN D) 2000 UNITS CAPS Take 1 capsule by mouth daily.      . clonazePAM (KLONOPIN) 0.5 MG tablet Take 0.5 mg by mouth at bedtime.      Marland Kitchen CRANBERRY PO Take 300 mg by mouth daily.      . cyanocobalamin 2000 MCG tablet Take 2,000 mcg by mouth daily.      Marland Kitchen escitalopram (LEXAPRO) 10 MG tablet Take 10 mg  by mouth daily.      . Esomeprazole Magnesium (NEXIUM PO) Take by mouth.      . natalizumab (TYSABRI) 300 MG/15ML injection Inject into the vein every 30 (thirty) days.       . [DISCONTINUED] ketorolac (TORADOL) 10 MG tablet Take 1 tablet (10 mg total) by mouth every 6 (six) hours as needed for pain.  20 tablet  0  . [DISCONTINUED] Melatonin 5 MG TABS Take 1 tablet by mouth at bedtime.      . [DISCONTINUED] meperidine (DEMEROL) 50 MG tablet Take 1 tablet (50 mg total) by mouth every 4 (four) hours as needed for pain.  30 tablet  0  . [DISCONTINUED] methocarbamol (ROBAXIN) 500 MG tablet Take 1 tablet (500 mg total) by mouth 4 (four) times daily.  30 tablet  none  . [DISCONTINUED] nitrofurantoin (MACRODANTIN) 50 MG capsule Take 50 mg by mouth daily.       No facility-administered encounter medications on file as of 02/21/2013.    EXAM:  BP 110/80  Pulse 76  Temp(Src) 98.1 F (36.7 C) (Oral)  Ht 5' 5.75" (1.67 m)  Wt 161 lb (73.029 kg)  BMI 26.19 kg/m2  SpO2  98%  Body mass index is 26.19 kg/(m^2).  Physical Exam: Vital signs reviewed WGN:FAOZ is a well-developed well-nourished alert cooperative   female who appears her stated age in no acute distress.  HEENT: normocephalic atraumatic , Eyes: PERRL EOM's full, conjunctiva clear, Nares: paten,t no deformity discharge or tenderness., Ears: no deformity EAC's clear TMs with normal landmarks. Mouth: clear OP, no lesions, edema.  Moist mucous membranes. Dentition in adequate repair. NECK: supple without masses, thyromegaly or bruits. CHEST/PULM:  Clear to auscultation and percussion breath sounds equal no wheeze , rales or rhonchi. No chest wall deformities or tenderness. Breast: normal by inspection . No dimpling, discharge, masses, tenderness or discharge . CV: PMI is nondisplaced, S1 S2 no gallops, murmurs, rubs. Peripheral pulses are full without delay.No JVD .  ABDOMEN: Bowel sounds normal nontender  No guard or rebound, no hepato splenomegal no CVA tenderness.  No hernia. Extremtities:  No clubbing cyanosis or edema, no acute joint swelling  mildy unsteady gait  NEURO:  Oriented x3, cranial nerves 3-12 appear to be intact,  No clonus  Pulses intact  SKIN: No acute rashes normal turgor, color, no bruising or petechiae. PSYCH: Oriented, good eye contact, no obvious depression anxiety, cognition and judgment appear normal. LN: no cervical axillary inguinal adenopathy  Lab Results  Component Value Date   WBC 6.6 02/14/2013   HGB 12.6 02/14/2013   HCT 36.1 02/14/2013   PLT 211.0 02/14/2013   GLUCOSE 85 02/14/2013   CHOL 189 02/14/2013   TRIG 130.0 02/14/2013   HDL 53.70 02/14/2013   LDLDIRECT 137.1 01/24/2011   LDLCALC 109* 02/14/2013   ALT 23 02/14/2013   AST 18 02/14/2013   NA 142 02/14/2013   K 4.0 02/14/2013   CL 106 02/14/2013   CREATININE 0.7 02/14/2013   BUN 12 02/14/2013   CO2 30 02/14/2013   TSH 2.07 02/14/2013    ASSESSMENT AND PLAN:  Discussed the following assessment and plan:  Visit  for preventive health examination  Postmenopausal estrogen deficiency - Plan: DG Bone Density  SCLEROSIS, MULTIPLE - Plan: DG Bone Density  Family hx osteoporosis - Plan: DG Bone Density  Voiding dysfunction - on  I/O cath  from ms   Recurrent UTI - caths and antibiotic prophylaxis   Dysphagia, pharyngoesophageal - prob from  ms At higher risk for  Osteoporosis    Dec weight bearing  Patient Care Team: Madelin Headings, MD as PCP - General Rush Farmer as Referring Physician (Obstetrics and Gynecology) Pearletha Furl. Sater (Neurology) Kathi Ludwig, MD as Attending Physician (Urology) Hart Carwin, MD as Attending Physician (Gastroenterology) Patient Instructions  Schedule a bone density   As we discussed  . You have higher risk for this .   [id number].  Get mammo gram when due.  Decrease carbs at breakfast and try protein  Base breakfast such eggs  Etc.  Continue lifestyle intervention healthy eating and exercise .   Preventive Care for Adults, Female A healthy lifestyle and preventive care can promote health and wellness. Preventive health guidelines for women include the following key practices.  A routine yearly physical is a good way to check with your caregiver about your health and preventive screening. It is a chance to share any concerns and updates on your health, and to receive a thorough exam.  Visit your dentist for a routine exam and preventive care every 6 months. Brush your teeth twice a day and floss once a day. Good oral hygiene prevents tooth decay and gum disease.  The frequency of eye exams is based on your age, health, family medical history, use of contact lenses, and other factors. Follow your caregiver's recommendations for frequency of eye exams.  Eat a healthy diet. Foods like vegetables, fruits, whole grains, low-fat dairy products, and lean protein foods contain the nutrients you need without too many calories. Decrease your intake of foods high in  solid fats, added sugars, and salt. Eat the right amount of calories for you.Get information about a proper diet from your caregiver, if necessary.  Regular physical exercise is one of the most important things you can do for your health. Most adults should get at least 150 minutes of moderate-intensity exercise (any activity that increases your heart rate and causes you to sweat) each week. In addition, most adults need muscle-strengthening exercises on 2 or more days a week.  Maintain a healthy weight. The body mass index (BMI) is a screening tool to identify possible weight problems. It provides an estimate of body fat based on height and weight. Your caregiver can help determine your BMI, and can help you achieve or maintain a healthy weight.For adults 20 years and older:  A BMI below 18.5 is considered underweight.  A BMI of 18.5 to 24.9 is normal.  A BMI of 25 to 29.9 is considered overweight.  A BMI of 30 and above is considered obese.  Maintain normal blood lipids and cholesterol levels by exercising and minimizing your intake of saturated fat. Eat a balanced diet with plenty of fruit and vegetables. Blood tests for lipids and cholesterol should begin at age 21 and be repeated every 5 years. If your lipid or cholesterol levels are high, you are over 50, or you are at high risk for heart disease, you may need your cholesterol levels checked more frequently.Ongoing high lipid and cholesterol levels should be treated with medicines if diet and exercise are not effective.  If you smoke, find out from your caregiver how to quit. If you do not use tobacco, do not start.  If you are pregnant, do not drink alcohol. If you are breastfeeding, be very cautious about drinking alcohol. If you are not pregnant and choose to drink alcohol, do not exceed 1 drink per day. One drink is considered to be 12  ounces (355 mL) of beer, 5 ounces (148 mL) of wine, or 1.5 ounces (44 mL) of liquor.  Avoid use of  street drugs. Do not share needles with anyone. Ask for help if you need support or instructions about stopping the use of drugs.  High blood pressure causes heart disease and increases the risk of stroke. Your blood pressure should be checked at least every 1 to 2 years. Ongoing high blood pressure should be treated with medicines if weight loss and exercise are not effective.  If you are 74 to 59 years old, ask your caregiver if you should take aspirin to prevent strokes.  Diabetes screening involves taking a blood sample to check your fasting blood sugar level. This should be done once every 3 years, after age 49, if you are within normal weight and without risk factors for diabetes. Testing should be considered at a younger age or be carried out more frequently if you are overweight and have at least 1 risk factor for diabetes.  Breast cancer screening is essential preventive care for women. You should practice "breast self-awareness." This means understanding the normal appearance and feel of your breasts and may include breast self-examination. Any changes detected, no matter how small, should be reported to a caregiver. Women in their 43s and 30s should have a clinical breast exam (CBE) by a caregiver as part of a regular health exam every 1 to 3 years. After age 59, women should have a CBE every year. Starting at age 25, women should consider having a mammography (breast X-ray test) every year. Women who have a family history of breast cancer should talk to their caregiver about genetic screening. Women at a high risk of breast cancer should talk to their caregivers about having magnetic resonance imaging (MRI) and a mammography every year.  The Pap test is a screening test for cervical cancer. A Pap test can show cell changes on the cervix that might become cervical cancer if left untreated. A Pap test is a procedure in which cells are obtained and examined from the lower end of the uterus  (cervix).  Women should have a Pap test starting at age 27.  Between ages 64 and 17, Pap tests should be repeated every 2 years.  Beginning at age 66, you should have a Pap test every 3 years as long as the past 3 Pap tests have been normal.  Some women have medical problems that increase the chance of getting cervical cancer. Talk to your caregiver about these problems. It is especially important to talk to your caregiver if a new problem develops soon after your last Pap test. In these cases, your caregiver may recommend more frequent screening and Pap tests.  The above recommendations are the same for women who have or have not gotten the vaccine for human papillomavirus (HPV).  If you had a hysterectomy for a problem that was not cancer or a condition that could lead to cancer, then you no longer need Pap tests. Even if you no longer need a Pap test, a regular exam is a good idea to make sure no other problems are starting.  If you are between ages 71 and 82, and you have had normal Pap tests going back 10 years, you no longer need Pap tests. Even if you no longer need a Pap test, a regular exam is a good idea to make sure no other problems are starting.  If you have had past treatment for cervical cancer  or a condition that could lead to cancer, you need Pap tests and screening for cancer for at least 20 years after your treatment.  If Pap tests have been discontinued, risk factors (such as a new sexual partner) need to be reassessed to determine if screening should be resumed.  The HPV test is an additional test that may be used for cervical cancer screening. The HPV test looks for the virus that can cause the cell changes on the cervix. The cells collected during the Pap test can be tested for HPV. The HPV test could be used to screen women aged 68 years and older, and should be used in women of any age who have unclear Pap test results. After the age of 34, women should have HPV testing  at the same frequency as a Pap test.  Colorectal cancer can be detected and often prevented. Most routine colorectal cancer screening begins at the age of 41 and continues through age 43. However, your caregiver may recommend screening at an earlier age if you have risk factors for colon cancer. On a yearly basis, your caregiver may provide home test kits to check for hidden blood in the stool. Use of a small camera at the end of a tube, to directly examine the colon (sigmoidoscopy or colonoscopy), can detect the earliest forms of colorectal cancer. Talk to your caregiver about this at age 52, when routine screening begins. Direct examination of the colon should be repeated every 5 to 10 years through age 81, unless early forms of pre-cancerous polyps or small growths are found.  Hepatitis C blood testing is recommended for all people born from [birth year] through 1967 and any individual with known risks for hepatitis C.  Practice safe sex. Use condoms and avoid high-risk sexual practices to reduce the spread of sexually transmitted infections (STIs). STIs include gonorrhea, chlamydia, syphilis, trichomonas, herpes, HPV, and human immunodeficiency virus (HIV). Herpes, HIV, and HPV are viral illnesses that have no cure. They can result in disability, cancer, and death. Sexually active women aged 44 and younger should be checked for chlamydia. Older women with new or multiple partners should also be tested for chlamydia. Testing for other STIs is recommended if you are sexually active and at increased risk.  Osteoporosis is a disease in which the bones lose minerals and strength with aging. This can result in serious bone fractures. The risk of osteoporosis can be identified using a bone density scan. Women ages 42 and over and women at risk for fractures or osteoporosis should discuss screening with their caregivers. Ask your caregiver whether you should take a calcium supplement or vitamin D to reduce the rate of  osteoporosis.  Menopause can be associated with physical symptoms and risks. Hormone replacement therapy is available to decrease symptoms and risks. You should talk to your caregiver about whether hormone replacement therapy is right for you.  Use sunscreen with sun protection factor (SPF) of 30 or more. Apply sunscreen liberally and repeatedly throughout the day. You should seek shade when your shadow is shorter than you. Protect yourself by wearing long sleeves, pants, a wide-brimmed hat, and sunglasses year round, whenever you are outdoors.  Once a month, do a whole body skin exam, using a mirror to look at the skin on your back. Notify your caregiver of new moles, moles that have irregular borders, moles that are larger than a pencil eraser, or moles that have changed in shape or color.  Stay current with required immunizations.  Influenza. You need a dose every fall (or winter). The composition of the flu vaccine changes each year, so being vaccinated once is not enough.  Pneumococcal polysaccharide. You need 1 to 2 doses if you smoke cigarettes or if you have certain chronic medical conditions. You need 1 dose at age 79 (or older) if you have never been vaccinated.  Tetanus, diphtheria, pertussis (Tdap, Td). Get 1 dose of Tdap vaccine if you are younger than age 87, are over 13 and have contact with an infant, are a Research scientist (physical sciences), are pregnant, or simply want to be protected from whooping cough. After that, you need a Td booster dose every 10 years. Consult your caregiver if you have not had at least 3 tetanus and diphtheria-containing shots sometime in your life or have a deep or dirty wound.  HPV. You need this vaccine if you are a woman age 79 or younger. The vaccine is given in 3 doses over 6 months.  Measles, mumps, rubella (MMR). You need at least 1 dose of MMR if you were born in 1957 or later. You may also need a second dose.  Meningococcal. If you are age 104 to 77 and a  first-year college student living in a residence hall, or have one of several medical conditions, you need to get vaccinated against meningococcal disease. You may also need additional booster doses.  Zoster (shingles). If you are age 66 or older, you should get this vaccine.  Varicella (chickenpox). If you have never had chickenpox or you were vaccinated but received only 1 dose, talk to your caregiver to find out if you need this vaccine.  Hepatitis A. You need this vaccine if you have a specific risk factor for hepatitis A virus infection or you simply wish to be protected from this disease. The vaccine is usually given as 2 doses, 6 to 18 months apart.  Hepatitis B. You need this vaccine if you have a specific risk factor for hepatitis B virus infection or you simply wish to be protected from this disease. The vaccine is given in 3 doses, usually over 6 months.     Neta Mends. [name] M.D.  Health Maintenance  Topic Date Due  . Influenza Vaccine  03/28/2013  . Pap Smear  10/10/2014  . Mammogram  11/26/2014  . Colonoscopy  02/18/2015  . Tetanus/tdap  02/04/2021   Health Maintenance Review

## 2013-02-21 NOTE — Patient Instructions (Addendum)
Schedule a bone density   As we discussed  . You have higher risk for this .   [id number].  Get mammo gram when due.  Decrease carbs at breakfast and try protein  Base breakfast such eggs  Etc.  Continue lifestyle intervention healthy eating and exercise .   Preventive Care for Adults, Female A healthy lifestyle and preventive care can promote health and wellness. Preventive health guidelines for women include the following key practices.  A routine yearly physical is a good way to check with your caregiver about your health and preventive screening. It is a chance to share any concerns and updates on your health, and to receive a thorough exam.  Visit your dentist for a routine exam and preventive care every 6 months. Brush your teeth twice a day and floss once a day. Good oral hygiene prevents tooth decay and gum disease.  The frequency of eye exams is based on your age, health, family medical history, use of contact lenses, and other factors. Follow your caregiver's recommendations for frequency of eye exams.  Eat a healthy diet. Foods like vegetables, fruits, whole grains, low-fat dairy products, and lean protein foods contain the nutrients you need without too many calories. Decrease your intake of foods high in solid fats, added sugars, and salt. Eat the right amount of calories for you.Get information about a proper diet from your caregiver, if necessary.  Regular physical exercise is one of the most important things you can do for your health. Most adults should get at least 150 minutes of moderate-intensity exercise (any activity that increases your heart rate and causes you to sweat) each week. In addition, most adults need muscle-strengthening exercises on 2 or more days a week.  Maintain a healthy weight. The body mass index (BMI) is a screening tool to identify possible weight problems. It provides an estimate of body fat based on height and weight. Your caregiver can help determine  your BMI, and can help you achieve or maintain a healthy weight.For adults 20 years and older:  A BMI below 18.5 is considered underweight.  A BMI of 18.5 to 24.9 is normal.  A BMI of 25 to 29.9 is considered overweight.  A BMI of 30 and above is considered obese.  Maintain normal blood lipids and cholesterol levels by exercising and minimizing your intake of saturated fat. Eat a balanced diet with plenty of fruit and vegetables. Blood tests for lipids and cholesterol should begin at age 36 and be repeated every 5 years. If your lipid or cholesterol levels are high, you are over 50, or you are at high risk for heart disease, you may need your cholesterol levels checked more frequently.Ongoing high lipid and cholesterol levels should be treated with medicines if diet and exercise are not effective.  If you smoke, find out from your caregiver how to quit. If you do not use tobacco, do not start.  If you are pregnant, do not drink alcohol. If you are breastfeeding, be very cautious about drinking alcohol. If you are not pregnant and choose to drink alcohol, do not exceed 1 drink per day. One drink is considered to be 12 ounces (355 mL) of beer, 5 ounces (148 mL) of wine, or 1.5 ounces (44 mL) of liquor.  Avoid use of street drugs. Do not share needles with anyone. Ask for help if you need support or instructions about stopping the use of drugs.  High blood pressure causes heart disease and increases the risk  of stroke. Your blood pressure should be checked at least every 1 to 2 years. Ongoing high blood pressure should be treated with medicines if weight loss and exercise are not effective.  If you are 55 to 59 years old, ask your caregiver if you should take aspirin to prevent strokes.  Diabetes screening involves taking a blood sample to check your fasting blood sugar level. This should be done once every 3 years, after age 2, if you are within normal weight and without risk factors for  diabetes. Testing should be considered at a younger age or be carried out more frequently if you are overweight and have at least 1 risk factor for diabetes.  Breast cancer screening is essential preventive care for women. You should practice "breast self-awareness." This means understanding the normal appearance and feel of your breasts and may include breast self-examination. Any changes detected, no matter how small, should be reported to a caregiver. Women in their 72s and 30s should have a clinical breast exam (CBE) by a caregiver as part of a regular health exam every 1 to 3 years. After age 45, women should have a CBE every year. Starting at age 75, women should consider having a mammography (breast X-ray test) every year. Women who have a family history of breast cancer should talk to their caregiver about genetic screening. Women at a high risk of breast cancer should talk to their caregivers about having magnetic resonance imaging (MRI) and a mammography every year.  The Pap test is a screening test for cervical cancer. A Pap test can show cell changes on the cervix that might become cervical cancer if left untreated. A Pap test is a procedure in which cells are obtained and examined from the lower end of the uterus (cervix).  Women should have a Pap test starting at age 55.  Between ages 24 and 8, Pap tests should be repeated every 2 years.  Beginning at age 47, you should have a Pap test every 3 years as long as the past 3 Pap tests have been normal.  Some women have medical problems that increase the chance of getting cervical cancer. Talk to your caregiver about these problems. It is especially important to talk to your caregiver if a new problem develops soon after your last Pap test. In these cases, your caregiver may recommend more frequent screening and Pap tests.  The above recommendations are the same for women who have or have not gotten the vaccine for human papillomavirus  (HPV).  If you had a hysterectomy for a problem that was not cancer or a condition that could lead to cancer, then you no longer need Pap tests. Even if you no longer need a Pap test, a regular exam is a good idea to make sure no other problems are starting.  If you are between ages 80 and 67, and you have had normal Pap tests going back 10 years, you no longer need Pap tests. Even if you no longer need a Pap test, a regular exam is a good idea to make sure no other problems are starting.  If you have had past treatment for cervical cancer or a condition that could lead to cancer, you need Pap tests and screening for cancer for at least 20 years after your treatment.  If Pap tests have been discontinued, risk factors (such as a new sexual partner) need to be reassessed to determine if screening should be resumed.  The HPV test is an  additional test that may be used for cervical cancer screening. The HPV test looks for the virus that can cause the cell changes on the cervix. The cells collected during the Pap test can be tested for HPV. The HPV test could be used to screen women aged 53 years and older, and should be used in women of any age who have unclear Pap test results. After the age of 9, women should have HPV testing at the same frequency as a Pap test.  Colorectal cancer can be detected and often prevented. Most routine colorectal cancer screening begins at the age of 68 and continues through age 58. However, your caregiver may recommend screening at an earlier age if you have risk factors for colon cancer. On a yearly basis, your caregiver may provide home test kits to check for hidden blood in the stool. Use of a small camera at the end of a tube, to directly examine the colon (sigmoidoscopy or colonoscopy), can detect the earliest forms of colorectal cancer. Talk to your caregiver about this at age 25, when routine screening begins. Direct examination of the colon should be repeated every 5  to 10 years through age 16, unless early forms of pre-cancerous polyps or small growths are found.  Hepatitis C blood testing is recommended for all people born from [birth year] through 1967 and any individual with known risks for hepatitis C.  Practice safe sex. Use condoms and avoid high-risk sexual practices to reduce the spread of sexually transmitted infections (STIs). STIs include gonorrhea, chlamydia, syphilis, trichomonas, herpes, HPV, and human immunodeficiency virus (HIV). Herpes, HIV, and HPV are viral illnesses that have no cure. They can result in disability, cancer, and death. Sexually active women aged 5 and younger should be checked for chlamydia. Older women with new or multiple partners should also be tested for chlamydia. Testing for other STIs is recommended if you are sexually active and at increased risk.  Osteoporosis is a disease in which the bones lose minerals and strength with aging. This can result in serious bone fractures. The risk of osteoporosis can be identified using a bone density scan. Women ages 34 and over and women at risk for fractures or osteoporosis should discuss screening with their caregivers. Ask your caregiver whether you should take a calcium supplement or vitamin D to reduce the rate of osteoporosis.  Menopause can be associated with physical symptoms and risks. Hormone replacement therapy is available to decrease symptoms and risks. You should talk to your caregiver about whether hormone replacement therapy is right for you.  Use sunscreen with sun protection factor (SPF) of 30 or more. Apply sunscreen liberally and repeatedly throughout the day. You should seek shade when your shadow is shorter than you. Protect yourself by wearing long sleeves, pants, a wide-brimmed hat, and sunglasses year round, whenever you are outdoors.  Once a month, do a whole body skin exam, using a mirror to look at the skin on your back. Notify your caregiver of new moles, moles that  have irregular borders, moles that are larger than a pencil eraser, or moles that have changed in shape or color.  Stay current with required immunizations.  Influenza. You need a dose every fall (or winter). The composition of the flu vaccine changes each year, so being vaccinated once is not enough.  Pneumococcal polysaccharide. You need 1 to 2 doses if you smoke cigarettes or if you have certain chronic medical conditions. You need 1 dose at age 81 (or older) if  you have never been vaccinated.  Tetanus, diphtheria, pertussis (Tdap, Td). Get 1 dose of Tdap vaccine if you are younger than age 59, are over 21 and have contact with an infant, are a Research scientist (physical sciences), are pregnant, or simply want to be protected from whooping cough. After that, you need a Td booster dose every 10 years. Consult your caregiver if you have not had at least 3 tetanus and diphtheria-containing shots sometime in your life or have a deep or dirty wound.  HPV. You need this vaccine if you are a woman age 6 or younger. The vaccine is given in 3 doses over 6 months.  Measles, mumps, rubella (MMR). You need at least 1 dose of MMR if you were born in 1957 or later. You may also need a second dose.  Meningococcal. If you are age 90 to 44 and a first-year college student living in a residence hall, or have one of several medical conditions, you need to get vaccinated against meningococcal disease. You may also need additional booster doses.  Zoster (shingles). If you are age 26 or older, you should get this vaccine.  Varicella (chickenpox). If you have never had chickenpox or you were vaccinated but received only 1 dose, talk to your caregiver to find out if you need this vaccine.  Hepatitis A. You need this vaccine if you have a specific risk factor for hepatitis A virus infection or you simply wish to be protected from this disease. The vaccine is usually given as 2 doses, 6 to 18 months apart.  Hepatitis B. You need this  vaccine if you have a specific risk factor for hepatitis B virus infection or you simply wish to be protected from this disease. The vaccine is given in 3 doses, usually over 6 months.

## 2013-02-24 ENCOUNTER — Encounter: Payer: Self-pay | Admitting: Internal Medicine

## 2013-02-24 DIAGNOSIS — Z780 Asymptomatic menopausal state: Secondary | ICD-10-CM | POA: Insufficient documentation

## 2013-02-24 DIAGNOSIS — Z0000 Encounter for general adult medical examination without abnormal findings: Secondary | ICD-10-CM | POA: Insufficient documentation

## 2013-10-27 ENCOUNTER — Encounter: Payer: Self-pay | Admitting: Internal Medicine

## 2013-10-27 ENCOUNTER — Ambulatory Visit (INDEPENDENT_AMBULATORY_CARE_PROVIDER_SITE_OTHER): Payer: BC Managed Care – PPO | Admitting: Internal Medicine

## 2013-10-27 VITALS — BP 140/82 | Temp 98.2°F | Ht 65.75 in | Wt 167.0 lb

## 2013-10-27 DIAGNOSIS — L609 Nail disorder, unspecified: Secondary | ICD-10-CM

## 2013-10-27 DIAGNOSIS — G35 Multiple sclerosis: Secondary | ICD-10-CM

## 2013-10-27 NOTE — Progress Notes (Signed)
Chief Complaint  Patient presents with  . Nail Problem    Left foot.    HPI: Patient comes in today for   new problem evaluation. Noted 2 weeks or so of left 4th toed nail base is very dark almost balck  Denies specific trauma but has toe nail fungust and uses a topical  And soaks and at times  . No pain or swelling .  MS is stable good days and  Not so.   Foot not numb   ROS: See pertinent positives and negatives per HPI.  Past Medical History  Diagnosis Date  . MS (multiple sclerosis)   . Rhinitis   . Varicose veins   . Superficial phlebitis   . Mood disorder   . Depressive reaction   . Diverticulitis   . Diverticulosis   . Anemia   . Gastropathy 2013    reactive  . Frequent UTI LAST 7/13  . Blood in stool, frank 12/01/2011    Family History  Problem Relation Age of Onset  . Lung cancer Father   . Colon cancer Neg Hx     History   Social History  . Marital Status: Married    Spouse Name: N/A    Number of Children: 4  . Years of Education: N/A   Occupational History  . Disability    Social History Main Topics  . Smoking status: Never Smoker   . Smokeless tobacco: Never Used  . Alcohol Use: Yes     Comment: occ  . Drug Use: No  . Sexual Activity: None   Other Topics Concern  . None   Social History Narrative   Unable to volunteer    Now on disability   Children now out of home   Daily caffeine     Outpatient Encounter Prescriptions as of 10/27/2013  Medication Sig  . Alum Hydroxide-Mag Carbonate (GAVISCON PO) Take by mouth.  . Ascorbic Acid (VITAMIN C PO) Take 2,000 Units by mouth.  . Aspirin Effervescent (ALKA-SELTZER PO) Take by mouth.  . baclofen (LIORESAL) 10 MG tablet Take 20 mg by mouth 2 (two) times daily.   Marland Kitchen buPROPion (WELLBUTRIN XL) 150 MG 24 hr tablet Take 450 mg by mouth daily.  . Cholecalciferol (VITAMIN D) 2000 UNITS CAPS Take 1 capsule by mouth daily.  . clonazePAM (KLONOPIN) 0.5 MG tablet Take 0.5 mg by mouth at bedtime.  Marland Kitchen  CRANBERRY PO Take 300 mg by mouth daily.  Marland Kitchen escitalopram (LEXAPRO) 10 MG tablet Take 10 mg by mouth daily.  . Esomeprazole Magnesium (NEXIUM PO) Take by mouth.  . Meth-Hyo-M Bl-Na Phos-Ph Sal (URIBEL) 118 MG CAPS Take by mouth. Using PRN  . natalizumab (TYSABRI) 300 MG/15ML injection Inject into the vein every 30 (thirty) days.   Marland Kitchen VAGIFEM 10 MCG TABS vaginal tablet   . [DISCONTINUED] trimethoprim (TRIMPEX) 100 MG tablet   . [DISCONTINUED] cyanocobalamin 2000 MCG tablet Take 2,000 mcg by mouth daily.    EXAM:  BP 140/82  Temp(Src) 98.2 F (36.8 C) (Oral)  Ht 5' 5.75" (1.67 m)  Wt 167 lb (75.751 kg)  BMI 27.16 kg/m2  Body mass index is 27.16 kg/(m^2).  GENERAL: vitals reviewed and listed above, alert, oriented, appears well hydrated and in no acute distress MS: moves all extremities  Weak gait but independent . Left foot  4th nail with thickening no redness or swelling but  Dark ? Purple vs balck along cuticle of nail distal thickening noted  Other nails with some  thickening cw onycho distal.  PSYCH: pleasant and cooperative, no obvious depression or anxiety  ASSESSMENT AND PLAN:  Discussed the following assessment and plan:  Nail abnormalities - prob has onychomycosis but area of darkness prob cuticle trauma but atuypical and if persists  see derm .   Multiple sclerosis  -Patient advised to return or notify health care team  if symptoms worsen ,persist or new concerns arise.  Patient Instructions  The dark area looks like probably an area of trauma to the cuticle  However  If not resolving or fading  In the next month or so then we should have dermatology check the area.  If you need help getting an appt with them call our office for help or recheck . Consider taking a magnification picture of the area to follow progression.    [name]. Geana Walts M.D.

## 2013-10-27 NOTE — Patient Instructions (Signed)
The dark area looks like probably an area of trauma to the cuticle  However  If not resolving or fading  In the next month or so then we should have dermatology check the area.  If you need help getting an appt with them call our office for help or recheck . Consider taking a magnification picture of the area to follow progression.

## 2014-01-09 ENCOUNTER — Other Ambulatory Visit: Payer: Self-pay | Admitting: Internal Medicine

## 2014-02-13 ENCOUNTER — Encounter: Payer: Self-pay | Admitting: Internal Medicine

## 2014-02-13 ENCOUNTER — Ambulatory Visit (INDEPENDENT_AMBULATORY_CARE_PROVIDER_SITE_OTHER): Payer: BC Managed Care – PPO | Admitting: Internal Medicine

## 2014-02-13 VITALS — BP 96/62 | HR 79 | Temp 98.4°F | Ht 67.75 in | Wt 169.0 lb

## 2014-02-13 DIAGNOSIS — G35 Multiple sclerosis: Secondary | ICD-10-CM

## 2014-02-13 DIAGNOSIS — R5383 Other fatigue: Secondary | ICD-10-CM

## 2014-02-13 DIAGNOSIS — R5381 Other malaise: Secondary | ICD-10-CM | POA: Insufficient documentation

## 2014-02-13 DIAGNOSIS — R110 Nausea: Secondary | ICD-10-CM

## 2014-02-13 LAB — CBC WITH DIFFERENTIAL/PLATELET
Basophils Absolute: 0 10*3/uL (ref 0.0–0.1)
Basophils Relative: 0.3 % (ref 0.0–3.0)
EOS PCT: 3.1 % (ref 0.0–5.0)
Eosinophils Absolute: 0.2 10*3/uL (ref 0.0–0.7)
HEMATOCRIT: 36.3 % (ref 36.0–46.0)
Hemoglobin: 12.5 g/dL (ref 12.0–15.0)
LYMPHS ABS: 2.3 10*3/uL (ref 0.7–4.0)
Lymphocytes Relative: 33.9 % (ref 12.0–46.0)
MCHC: 34.3 g/dL (ref 30.0–36.0)
MCV: 89.4 fl (ref 78.0–100.0)
Monocytes Absolute: 0.6 10*3/uL (ref 0.1–1.0)
Monocytes Relative: 8.6 % (ref 3.0–12.0)
Neutro Abs: 3.7 10*3/uL (ref 1.4–7.7)
Neutrophils Relative %: 54.1 % (ref 43.0–77.0)
Platelets: 203 10*3/uL (ref 150.0–400.0)
RBC: 4.06 Mil/uL (ref 3.87–5.11)
RDW: 13.2 % (ref 11.5–15.5)
WBC: 6.9 10*3/uL (ref 4.0–10.5)

## 2014-02-13 LAB — LIPID PANEL
Cholesterol: 187 mg/dL (ref 0–200)
HDL: 51 mg/dL (ref >39.00)
LDL Cholesterol: 112 mg/dL — ABNORMAL HIGH (ref 0–99)
NonHDL: 136
Total CHOL/HDL Ratio: 4
Triglycerides: 121 mg/dL (ref 0.0–149.0)
VLDL: 24.2 mg/dL (ref 0.0–40.0)

## 2014-02-13 LAB — BASIC METABOLIC PANEL
BUN: 13 mg/dL (ref 6–23)
CHLORIDE: 107 meq/L (ref 96–112)
CO2: 27 meq/L (ref 19–32)
Calcium: 9 mg/dL (ref 8.4–10.5)
Creatinine, Ser: 0.6 mg/dL (ref 0.4–1.2)
GFR: 100.67 mL/min (ref >60.00)
GLUCOSE: 87 mg/dL (ref 70–99)
POTASSIUM: 3.9 meq/L (ref 3.5–5.1)
Sodium: 140 mEq/L (ref 135–145)

## 2014-02-13 LAB — HEMOGLOBIN A1C: Hgb A1c MFr Bld: 5.2 % (ref 4.6–6.5)

## 2014-02-13 LAB — HEPATIC FUNCTION PANEL
ALT: 29 U/L (ref 0–35)
AST: 26 U/L (ref 0–37)
Albumin: 4 g/dL (ref 3.5–5.2)
Alkaline Phosphatase: 65 U/L (ref 39–117)
Bilirubin, Direct: 0 mg/dL (ref 0.0–0.3)
TOTAL PROTEIN: 6.6 g/dL (ref 6.0–8.3)
Total Bilirubin: 0.8 mg/dL (ref 0.2–1.2)

## 2014-02-13 LAB — T4, FREE: Free T4: 0.74 ng/dL (ref 0.60–1.60)

## 2014-02-13 LAB — TSH: TSH: 0.66 u[IU]/mL (ref 0.35–4.50)

## 2014-02-13 NOTE — Progress Notes (Signed)
Pre visit review using our clinic review tool, if applicable. No additional management support is needed unless otherwise documented below in the visit note. 

## 2014-02-13 NOTE — Patient Instructions (Addendum)
Will notify you  of labs when available. Will flag dr Olevia Perches consider   zofran trial of  Ranitidine or nexium  for 2 weeks   This sounds like a motility problem  but will rule out metabolic issues.   May need fu with dr B

## 2014-02-13 NOTE — Progress Notes (Signed)
Chief Complaint  Patient presents with  . Follow-up    HPI: Patient comes in today for SDA for   problem evaluation. Past 4 months has been feeling poorly with nausea that has become more progressive and persistent.? Fatigue related or other. Has constipation fiber laxative  Helped  fibercon like .  nauseaous all the time in am pre and post eating  Will eat if food there a lot like pregnancy nausea . ? If she can do something else to try. No vomiting or weight loss unintentional.  Gi issues   has some related to the Ms  Sx   Dr Olevia Perches  his endoscopy in 2012 Endoscopy.   Had been feeling better  By the time seen. Apparently no structural problem Takes nexium as needed not every day For acid sx.  does take Gaviscon No sig pain  Has had scan  Preventive antibiotic and vit d and cranberrry and cath 2 x per day. 4 UTI and bladder dysfunction Takes a time to swallow   . This is been going on with her progressive MS. ROS: See pertinent positives and negatives per HPI. No current chest pain shortness of breath or cough. No new medications Wellbutrin has gone from 150 mg a day to 300 mg a day she is now on 5000 units of vitamin D and 2 mg of clonazepam. She has had a psychotic reaction with meclizine and therefore his not being given the other medications for nausea. Ask advice about other things that can be given. He apparently is allergic to Prilosec and got hives when she took this.  Past Medical History  Diagnosis Date  . MS (multiple sclerosis)   . Rhinitis   . Varicose veins   . Superficial phlebitis   . Mood disorder   . Depressive reaction   . Diverticulitis   . Diverticulosis   . Anemia   . Gastropathy 2013    reactive  . Frequent UTI LAST 7/13  . Blood in stool, frank 12/01/2011    Family History  Problem Relation Age of Onset  . Lung cancer Father   . Colon cancer Neg Hx     History   Social History  . Marital Status: Married    Spouse Name: N/A    Number of Children: 4   . Years of Education: N/A   Occupational History  . Disability    Social History Main Topics  . Smoking status: Never Smoker   . Smokeless tobacco: Never Used  . Alcohol Use: Yes     Comment: occ  . Drug Use: No  . Sexual Activity: None   Other Topics Concern  . None   Social History Narrative   Unable to volunteer    Now on disability   Children now out of home   Daily caffeine     Outpatient Encounter Prescriptions as of 02/13/2014  Medication Sig  . Alum Hydroxide-Mag Carbonate (GAVISCON PO) Take by mouth.  . Ascorbic Acid (VITAMIN C PO) Take 2,000 Units by mouth.  . Aspirin Effervescent (ALKA-SELTZER PO) Take by mouth.  . baclofen (LIORESAL) 10 MG tablet Take 20 mg by mouth 4 (four) times daily.   Marland Kitchen buPROPion (WELLBUTRIN XL) 300 MG 24 hr tablet Take 300 mg by mouth daily.  . Cholecalciferol (VITAMIN D-3) 5000 UNITS TABS Take 1 tablet by mouth daily.  . clonazePAM (KLONOPIN) 0.5 MG tablet Take 2 mg by mouth daily.  Marland Kitchen CRANBERRY PO Take 300 mg by mouth daily.  Marland Kitchen  escitalopram (LEXAPRO) 10 MG tablet Take 10 mg by mouth daily.  . Esomeprazole Magnesium (NEXIUM PO) Take by mouth.  . Meth-Hyo-M Bl-Na Phos-Ph Sal (URIBEL) 118 MG CAPS Take by mouth. Using PRN  . natalizumab (TYSABRI) 300 MG/15ML injection Inject into the vein every 30 (thirty) days.   Marland Kitchen VAGIFEM 10 MCG TABS vaginal tablet Place 1 tablet vaginally once a week.   . [DISCONTINUED] buPROPion (WELLBUTRIN XL) 150 MG 24 hr tablet Take 450 mg by mouth daily.  . [DISCONTINUED] Cholecalciferol (VITAMIN D) 2000 UNITS CAPS Take 1 capsule by mouth daily.  . [DISCONTINUED] clonazePAM (KLONOPIN) 0.5 MG tablet Take 0.5 mg by mouth at bedtime.    EXAM:  BP 96/62  Pulse 79  Temp(Src) 98.4 F (36.9 C) (Oral)  Ht 5' 7.75" (1.721 m)  Wt 169 lb (76.658 kg)  BMI 25.88 kg/m2  Body mass index is 25.88 kg/(m^2).  GENERAL: vitals reviewed and listed above, alert, oriented, appears well hydrated and in no acute distress able to  get up on the table independently cooperative normal speech and thought HEENT: atraumatic, conjunctiva  clear, no obvious abnormalities on inspection of external nose and ears OP : no lesion edema or exudate  NECK: no obvious masses on inspection palpation  LUNGS: clear to auscultation bilaterally, no wheezes, rales or rhonchi, good air movement CV: HRRR, no clubbing cyanosis or  peripheral edema nl cap refill  Abdomen soft without organomegaly guarding or rebound bowel sounds are present MS: moves all extremities independent gait. PSYCH: pleasant and cooperative, no obvious depression or anxiety  ASSESSMENT AND PLAN:  Discussed the following assessment and plan:  Nausea alone - Progressing problematic to him underlying acid symptoms but probably a motility issue - Plan: Basic metabolic panel, CBC with Differential, Hepatic function panel, Hemoglobin A1c, TSH, T4, free, Lipid panel  SCLEROSIS, MULTIPLE - Plan: Basic metabolic panel, CBC with Differential, Hepatic function panel, Hemoglobin A1c, TSH, T4, free, Lipid panel  Other malaise and fatigue - Plan: Basic metabolic panel, CBC with Differential, Hepatic function panel, Hemoglobin A1c, TSH, T4, free, Lipid panel Try to minimize medication side effect consider better acid suppression for few weeks to see if it helps her get input from Dr. Olevia Perches out other interventions rule out metabolic with laboratory testing today. -Patient advised to return or notify health care team  if symptoms worsen ,persist or new concerns arise.  Patient Instructions  Will notify you  of labs when available. Will flag dr Olevia Perches consider   zofran trial of  Ranitidine or nexium  for 2 weeks   This sounds like a motility problem  but will rule out metabolic issues.   May need fu with dr Jacinto Reap    [name]. [name] M.D. Lab Results  Component Value Date   WBC 6.9 02/13/2014   HGB 12.5 02/13/2014   HCT 36.3 02/13/2014   PLT 203.0 02/13/2014   GLUCOSE 87 02/13/2014    CHOL 187 02/13/2014   TRIG 121.0 02/13/2014   HDL 51.00 02/13/2014   LDLDIRECT 137.1 01/24/2011   LDLCALC 112* 02/13/2014   ALT 29 02/13/2014   AST 26 02/13/2014   NA 140 02/13/2014   K 3.9 02/13/2014   CL 107 02/13/2014   CREATININE 0.6 02/13/2014   BUN 13 02/13/2014   CO2 27 02/13/2014   TSH 0.66 02/13/2014   HGBA1C 5.2 02/13/2014

## 2014-02-15 ENCOUNTER — Telehealth: Payer: Self-pay | Admitting: *Deleted

## 2014-02-15 MED ORDER — SUCRALFATE 1 GM/10ML PO SUSP
ORAL | Status: DC
Start: 2014-02-15 — End: 2014-12-05

## 2014-02-15 MED ORDER — ESOMEPRAZOLE MAGNESIUM 40 MG PO CPDR
DELAYED_RELEASE_CAPSULE | ORAL | Status: DC
Start: 2014-02-15 — End: 2015-04-23

## 2014-02-15 MED ORDER — RANITIDINE HCL 300 MG PO TABS: ORAL_TABLET | ORAL | Status: DC

## 2014-02-15 NOTE — Telephone Encounter (Signed)
[name], it sounds like a recurrence of what she had in 2013. I agree with the GERD/Hypomotility? Gastroparesis. I would do Nexiem 40 mg pm, Ranitidine 300mg  am,#30, Carafate slurry 10cc po bid x1 week 14 oz,reduce amount of food especially at the end of the day ( prefer soups etc). Call back with an update in 1 week, if no better will see her and/or GES. thanx DB Aurea Aronov, can you, please call this pt and relate to her that I have connected with Dr Regis Bill and Have following recommendations as I listed above. ----- Message ----- From: Burnis Medin, MD Sent: 02/14/2014 8:08 PM To: [name], MD Patient notified of recommendations and rx's sent.

## 2014-02-20 ENCOUNTER — Other Ambulatory Visit: Payer: Self-pay | Admitting: Family Medicine

## 2014-03-06 ENCOUNTER — Telehealth: Payer: Self-pay | Admitting: Internal Medicine

## 2014-03-06 NOTE — Telephone Encounter (Signed)
I suspect the cause of dysphagia  Is esophageal dismotility caused by MS. She shoyld continue long term Zantac and take Nexien only prn. Please order Barium esophagram to assess motility.

## 2014-03-06 NOTE — Telephone Encounter (Signed)
Pt states that the zantac and nexium have helped the nausea a lot. She has come off of the meds and states she still has difficulty with swallowing at times. Pt states that at times she is still having some difficulty swallowing. States she took and OTC Nexium last night and it helped some. Pt states she understands you should not stay on PPI's long term. Dr. Olevia Perches please advise.

## 2014-03-06 NOTE — Telephone Encounter (Signed)
Spoke with pt and she is aware. Pt does not want to schedule barium esophagram at this time, states she needs to check and see how much is in her flex-spending account. States it may need to wait until the 1st of the year. Pt states she will call back when ready to schedule.

## 2014-05-10 ENCOUNTER — Ambulatory Visit (INDEPENDENT_AMBULATORY_CARE_PROVIDER_SITE_OTHER): Payer: BC Managed Care – PPO | Admitting: Family Medicine

## 2014-05-10 DIAGNOSIS — Z23 Encounter for immunization: Secondary | ICD-10-CM

## 2014-07-26 ENCOUNTER — Encounter: Payer: Self-pay | Admitting: Internal Medicine

## 2014-12-05 ENCOUNTER — Ambulatory Visit (INDEPENDENT_AMBULATORY_CARE_PROVIDER_SITE_OTHER): Payer: BC Managed Care – PPO | Admitting: Internal Medicine

## 2014-12-05 ENCOUNTER — Encounter: Payer: Self-pay | Admitting: Internal Medicine

## 2014-12-05 VITALS — BP 112/68 | HR 80 | Ht 65.75 in | Wt 167.8 lb

## 2014-12-05 DIAGNOSIS — G35 Multiple sclerosis: Secondary | ICD-10-CM | POA: Diagnosis not present

## 2014-12-05 DIAGNOSIS — K224 Dyskinesia of esophagus: Secondary | ICD-10-CM | POA: Diagnosis not present

## 2014-12-05 DIAGNOSIS — K5901 Slow transit constipation: Secondary | ICD-10-CM | POA: Diagnosis not present

## 2014-12-05 MED ORDER — NA SULFATE-K SULFATE-MG SULF 17.5-3.13-1.6 GM/177ML PO SOLN: 1.0000 | Freq: Once | ORAL | Status: DC

## 2014-12-05 NOTE — Patient Instructions (Addendum)
You have been scheduled for a colonoscopy. Please follow written instructions given to you at your visit today.  Please pick up your prep supplies at the pharmacy within the next 1-3 days. If you use inhalers (even only as needed), please bring them with you on the day of your procedure. Your physician has requested that you go to www.startemmi.com and enter the access code given to you at your visit today. This web site gives a general overview about your procedure. However, you should still follow specific instructions given to you by our office regarding your preparation for the procedure.  Use your probiotic every day Use Senokot S 2 pills every night at bedtime  Dr Regis Bill

## 2014-12-05 NOTE — Progress Notes (Signed)
[name] 10-17-1955 [id number]  Note: This dictation was prepared with Dragon digital system. Any transcriptional errors that result from this procedure are unintentional.   History of Present Illness: This is a 59 year old white female with multiple sclerosis and esophageal dysmotility. She also has change in the bowel habits having constipation and bloating. She does not like taking MiraLAX and would prefer an alternative. She has been on Keflex 250 mg once a day from Dr Gaynelle Arabian  for urinary retention. She is also on multiple psychotropic medications including Lexapro, Klonopin and Wellbutrin which affect adversely  Her GI motility. She has difficulty evacuating the stool. She is due for recall colonoscopy. Initial screening occurred in July 2006 after episode of diverticulitis. She was found to have moderately severe diverticulosis but no polyps. Upper endoscopy in July 2013 for dyspepsia and dysphagia was essentially normal. Biopsies revealed reactive gastropathy. Upper abdominal ultrasound in April 2013 was normal. Her main complaint is constipation and bloating. She has tried fiber supplements without success. She denies rectal bleeding. There is no family history of colon cancer     Past Medical History  Diagnosis Date  . MS (multiple sclerosis)   . Rhinitis   . Varicose veins   . Superficial phlebitis   . Mood disorder   . Depressive reaction   . Diverticulitis   . Diverticulosis   . Anemia   . Gastropathy 2013    reactive  . Frequent UTI LAST 7/13  . Blood in stool, frank 12/01/2011    Past Surgical History  Procedure Laterality Date  . Tonsillectomy and adenoidectomy    . Bladder surgery  2008    Tack  . Abdominal hysterectomy  2008  . Shoulder arthroscopy with rotator cuff repair and subacromial decompression  06/01/2012    Procedure: SHOULDER ARTHROSCOPY WITH ROTATOR CUFF REPAIR AND SUBACROMIAL DECOMPRESSION;  Surgeon: Magnus Sinning, MD;  Location: [facility];  Service: Orthopedics;  Laterality: Left;  LEFT SHOULDER ARTHROSCOPY WITH SUBACROMIAL DECOMPRESION AND DEBRIDEMENT OF PARTIAL ROTATOR CUFF TEAR    Allergies  Allergen Reactions  . Codeine     REACTION: itching  . Hydrocodone Itching  . Meclizine Hcl     REACTION: Psychotic reaction  . Oxycodone Itching  . Prilosec [Omeprazole Magnesium] Hives  . Tramadol Hcl Itching    Family history and social history have been reviewed.  Review of Systems: Positive for urinary retention. Abdominal distention. Pill dysphagia  The remainder of the 10 point ROS is negative except as outlined in the H&P  Physical Exam: General Appearance Well developed, in no distress Eyes  Non icteric  HEENT  Non traumatic, normocephalic  Mouth No lesion, tongue papillated, no cheilosis Neck Supple without adenopathy, thyroid not enlarged, no carotid bruits, no JVD Lungs Clear to auscultation bilaterally COR Normal S1, normal S2, regular rhythm, no murmur, quiet precordium Abdomen mildly distended abdomen with tympany in upper abdomen. Normal active bowel sounds. Minimal tenderness across upper abdomen. No palpable mass. No ascites Rectal not done Extremities  No pedal edema Skin No lesions Neurological Alert and oriented x 3 Psychological Normal mood and affect  Assessment and Plan:   59 year old white female with multiple sclerosis having GI dysmotility affecting her esophagus as well as at her colon and pelvic floor. She is due for screening colonoscopy which will be scheduled today. As an alternative to MiraLAX she will start on Senokot S, 2 tablets daily which may be supplemented with stool softener Colace one or 2 a  day. For bloating she will start probiotic one a day  Pill dysphagia, I suggested switching to liquid Keflex. Or taking her medications with applesauce which she  prefers  to do    Delfin Edis 12/05/2014

## 2014-12-06 ENCOUNTER — Ambulatory Visit (INDEPENDENT_AMBULATORY_CARE_PROVIDER_SITE_OTHER): Payer: BC Managed Care – PPO | Admitting: Podiatry

## 2014-12-06 VITALS — BP 116/67 | HR 69 | Resp 15

## 2014-12-06 DIAGNOSIS — B351 Tinea unguium: Secondary | ICD-10-CM

## 2014-12-06 DIAGNOSIS — M216X9 Other acquired deformities of unspecified foot: Secondary | ICD-10-CM

## 2014-12-06 DIAGNOSIS — L84 Corns and callosities: Secondary | ICD-10-CM

## 2014-12-06 DIAGNOSIS — Q667 Congenital pes cavus: Secondary | ICD-10-CM

## 2014-12-06 NOTE — Progress Notes (Signed)
   Subjective:    Patient ID: [name], female    DOB: [birth year], 59 y.o.   MRN: [id number]  HPI  Pt presents with bilateral nail fungus, and left foot callus, she has tried otc nail fungus products with no success  Review of Systems  HENT: Positive for sore throat.   Gastrointestinal: Positive for nausea, diarrhea and constipation.  Musculoskeletal: Positive for myalgias, back pain and gait problem.  Neurological: Positive for dizziness and weakness.  All other systems reviewed and are negative.      Objective:   Physical Exam        Assessment & Plan:

## 2014-12-07 NOTE — Progress Notes (Signed)
Subjective:     Patient ID: [name], female   DOB: 07-Dec-1955, 59 y.o.   MRN: [id number]  HPI patient presents with nail disease right big toe and digital deformities with keratotic lesions underneath the metatarsals of both feet. States the nail while not hurting she does not like the appearance and the calluses do become sore   Review of Systems  All other systems reviewed and are negative.      Objective:   Physical Exam  Constitutional: She is oriented to person, place, and time.  Cardiovascular: Intact distal pulses.   Musculoskeletal: Normal range of motion.  Neurological: She is oriented to person, place, and time.  Skin: Skin is warm.  Nursing note and vitals reviewed.  neurovascular status intact with muscle strength adequate and range of motion of the subtalar and midtarsal joint within normal limits. Patient's noted to have a discolored right big toenail and digital deformities with contracture at the MPJ lesser toes bilateral with some thickness and deformity of the lesser nailbeds. Patient's found to have plantar callus tissue second and third metatarsal of both feet that's painful and is noted to have good digital perfusion and is well oriented 3     Assessment:     Plantarflexed metatarsal secondary to digital contracture with nail disease secondary to structural toe position along with fungal component to the right big toenail    Plan:     H&P and all conditions discussed with patient. Debridement of lesions accomplished today and discussed long-term laser therapy along with topical and consideration of oral treatment for nail disease. Patient does not want oral and we will initiate laser and topical treatment at this time

## 2014-12-11 ENCOUNTER — Encounter: Payer: Self-pay | Admitting: Internal Medicine

## 2014-12-13 ENCOUNTER — Encounter: Payer: Self-pay | Admitting: Podiatry

## 2014-12-13 ENCOUNTER — Ambulatory Visit (INDEPENDENT_AMBULATORY_CARE_PROVIDER_SITE_OTHER): Payer: BC Managed Care – PPO | Admitting: Podiatry

## 2014-12-13 VITALS — BP 120/77 | HR 74 | Resp 15

## 2014-12-13 DIAGNOSIS — B351 Tinea unguium: Secondary | ICD-10-CM

## 2014-12-13 NOTE — Progress Notes (Signed)
Subjective:     Patient ID: [name], female   DOB: 02-22-1956, 59 y.o.   MRN: [id number]  HPI patient presents with fungus toenail right big toe   Review of Systems     Objective:   Physical Exam Neurovascular status intact with yellow discolored distal one half nailbed    Assessment:     Mycotic nail infection    Plan:     Laser applied approximately 1200 pulses to the nailbed itself with no problems and reappoint 6 weeks for repeat

## 2015-01-10 ENCOUNTER — Ambulatory Visit (AMBULATORY_SURGERY_CENTER): Payer: BC Managed Care – PPO | Admitting: Internal Medicine

## 2015-01-10 ENCOUNTER — Encounter: Payer: Self-pay | Admitting: Internal Medicine

## 2015-01-10 VITALS — BP 120/62 | HR 59 | Temp 97.5°F | Resp 19 | Ht 65.0 in | Wt 167.0 lb

## 2015-01-10 DIAGNOSIS — D125 Benign neoplasm of sigmoid colon: Secondary | ICD-10-CM

## 2015-01-10 DIAGNOSIS — K635 Polyp of colon: Secondary | ICD-10-CM

## 2015-01-10 DIAGNOSIS — D120 Benign neoplasm of cecum: Secondary | ICD-10-CM | POA: Diagnosis not present

## 2015-01-10 DIAGNOSIS — Z1211 Encounter for screening for malignant neoplasm of colon: Secondary | ICD-10-CM | POA: Diagnosis not present

## 2015-01-10 HISTORY — PX: COLONOSCOPY WITH PROPOFOL: SHX5780

## 2015-01-10 MED ORDER — SODIUM CHLORIDE 0.9 % IV SOLN: 500.0000 mL | INTRAVENOUS | Status: DC

## 2015-01-10 NOTE — Progress Notes (Signed)
Patient awakening,vss,report to rn 

## 2015-01-10 NOTE — Progress Notes (Signed)
Called to room to assist during endoscopic procedure.  Patient ID and intended procedure confirmed with present staff. Received instructions for my participation in the procedure from the performing physician.  

## 2015-01-10 NOTE — Patient Instructions (Signed)
YOU HAD AN ENDOSCOPIC PROCEDURE TODAY AT [facility]:   Refer to the procedure report that was given to you for any specific questions about what was found during the examination.  If the procedure report does not answer your questions, please call your gastroenterologist to clarify.  If you requested that your care partner not be given the details of your procedure findings, then the procedure report has been included in a sealed envelope for you to review at your convenience later.  YOU SHOULD EXPECT: Some feelings of bloating in the abdomen. Passage of more gas than usual.  Walking can help get rid of the air that was put into your GI tract during the procedure and reduce the bloating. If you had a lower endoscopy (such as a colonoscopy or flexible sigmoidoscopy) you may notice spotting of blood in your stool or on the toilet paper. If you underwent a bowel prep for your procedure, you may not have a normal bowel movement for a few days.  Please Note:  You might notice some irritation and congestion in your nose or some drainage.  This is from the oxygen used during your procedure.  There is no need for concern and it should clear up in a day or so.  SYMPTOMS TO REPORT IMMEDIATELY:   Following lower endoscopy (colonoscopy or flexible sigmoidoscopy):  Excessive amounts of blood in the stool  Significant tenderness or worsening of abdominal pains  Swelling of the abdomen that is new, acute  Fever of 100F or higher   For urgent or emergent issues, a gastroenterologist can be reached at any hour by calling [phone].   DIET: Your first meal following the procedure should be a small meal and then it is ok to progress to your normal diet. Heavy or fried foods are harder to digest and may make you feel nauseous or bloated.  Likewise, meals heavy in dairy and vegetables can increase bloating.  Drink plenty of fluids but you should avoid alcoholic beverages for 24  hours.  ACTIVITY:  You should plan to take it easy for the rest of today and you should NOT DRIVE or use heavy machinery until tomorrow (because of the sedation medicines used during the test).    FOLLOW UP: Our staff will call the number listed on your records the next business day following your procedure to check on you and address any questions or concerns that you may have regarding the information given to you following your procedure. If we do not reach you, we will leave a message.  However, if you are feeling well and you are not experiencing any problems, there is no need to return our call.  We will assume that you have returned to your regular daily activities without incident.  If any biopsies were taken you will be contacted by phone or by letter within the next 1-3 weeks.  Please call us at [phone] if you have not heard about the biopsies in 3 weeks.    SIGNATURES/CONFIDENTIALITY: You and/or your care partner have signed paperwork which will be entered into your electronic medical record.  These signatures attest to the fact that that the information above on your After Visit Summary has been reviewed and is understood.  Full responsibility of the confidentiality of this discharge information lies with you and/or your care-partner.    Handouts were given to your care partner on polyps, diverticulosis, and a high fiber diet with liberal fluid intake. You may resume  current medications today. Await biopsy results. Please call if any questions or concerns.   

## 2015-01-10 NOTE — Progress Notes (Signed)
No problems noted in the recovery room. maw 

## 2015-01-10 NOTE — Op Note (Signed)
[place]  [address] [place], [id number]   COLONOSCOPY PROCEDURE REPORT  PATIENT: [name], [name]  MR#: [id number] BIRTHDATE: 1955-08-15 , 58  yrs. old GENDER: female ENDOSCOPIST: [name], MD REFERRED VV:ZSMOL Darnelle Going, M.D. PROCEDURE DATE:  01/10/2015 PROCEDURE:   Colonoscopy, screening, Colonoscopy with cold biopsy polypectomy, and Colonoscopy with snare polypectomy First Screening Colonoscopy - Avg.  risk and is 50 yrs.  old or older - No.  Prior Negative Screening - Now for repeat screening. 10 or more years since last screening  History of Adenoma - Now for follow-up colonoscopy & has been > or = to 3 yrs.  N/A  Polyps removed today? Yes ASA CLASS:   Class II INDICATIONS:Screening for colonic neoplasia and Colorectal Neoplasm Risk Assessment for this procedure is average risk. MEDICATIONS: Monitored anesthesia care and Propofol 250 mg IV  DESCRIPTION OF PROCEDURE:   After the risks benefits and alternatives of the procedure were thoroughly explained, informed consent was obtained.  The digital rectal exam revealed no abnormalities of the rectum.   The LB [id number]-[id number]  endoscope was introduced through the anus and advanced to the cecum, which was identified by both the appendix and ileocecal valve. No adverse events experienced.   The quality of the prep was good.  (MoviPrep was used)  The instrument was then slowly withdrawn as the colon was fully examined. Estimated blood loss is zero unless otherwise noted in this procedure report.      COLON FINDINGS: Two polypoid shaped sessile polyps measuring 9 mm in size were found in the sigmoid colon and at the cecum.  A polypectomy was performed with a cold snare.  The resection was complete, the polyp tissue was completely retrieved and sent to histology.  A polypectomy was performed with cold forceps.  The resection was complete, the polyp tissue was completely retrieved and sent to  histology.   There was mild diverticulosis noted in the descending colon with associated tortuosity.  Retroflexed views revealed no abnormalities. The time to cecum = 6.39 Withdrawal time = 8.23   The scope was withdrawn and the procedure completed. COMPLICATIONS: There were no immediate complications.  ENDOSCOPIC IMPRESSION: 1.   Two sessile polyps were found in the sigmoid colonx1 and at the cecumx1, polypectomy was performed with a cold snare ( sigmoid colon); polypectomy was performed with cold forceps ( cecal polyp) 2.   There was mild diverticulosis noted in the descending colon  RECOMMENDATIONS: 1.  Await pathology results 2.  High fiber diet Recall colonoscopy pending path report  eSigned:  [name], MD 01/10/2015 2:36 PM   cc:   PATIENT NAME:  [name], [name] MR#: [id number]

## 2015-01-11 ENCOUNTER — Telehealth: Payer: Self-pay

## 2015-01-11 NOTE — Telephone Encounter (Signed)
  Follow up Call-  Call back number 01/10/2015  Post procedure Call Back phone  # [phone]  Permission to leave phone message Yes     Patient questions:  Do you have a fever, pain , or abdominal swelling? No. Pain Score  0 *  Have you tolerated food without any problems? Yes.    Have you been able to return to your normal activities? Yes.    Do you have any questions about your discharge instructions: Diet   No. Medications  No. Follow up visit  No.  Do you have questions or concerns about your Care? No.  Actions: * If pain score is 4 or above: No action needed, pain <4.  No problems per the pt. maw

## 2015-01-15 ENCOUNTER — Encounter: Payer: Self-pay | Admitting: Internal Medicine

## 2015-02-07 ENCOUNTER — Ambulatory Visit (INDEPENDENT_AMBULATORY_CARE_PROVIDER_SITE_OTHER): Payer: BC Managed Care – PPO | Admitting: Podiatry

## 2015-02-07 DIAGNOSIS — B351 Tinea unguium: Secondary | ICD-10-CM

## 2015-02-07 NOTE — Progress Notes (Signed)
Subjective:     Patient ID: [name], female   DOB: Jan 06, 1956, 59 y.o.   MRN: [id number]  HPI patient states nails seem slightly better but still present   Review of Systems     Objective:   Physical Exam Neurovascular status intact with continued thick nail bed noted with mild improvement of the hallux nail    Assessment:     Mycotic nail infection with mild improvement with laser    Plan:     Laser reapplied approximate 1200 pulses tolerated well reappoint 4 months

## 2015-04-03 ENCOUNTER — Emergency Department (HOSPITAL_BASED_OUTPATIENT_CLINIC_OR_DEPARTMENT_OTHER)
Admit: 2015-04-03 | Discharge: 2015-04-03 | Disposition: A | Discharge status: Home / Self Care | Payer: BC Managed Care – PPO | Attending: Emergency Medicine | Admitting: Emergency Medicine

## 2015-04-03 ENCOUNTER — Encounter (HOSPITAL_COMMUNITY): Payer: Self-pay | Admitting: Emergency Medicine

## 2015-04-03 ENCOUNTER — Emergency Department (HOSPITAL_COMMUNITY): Payer: BC Managed Care – PPO

## 2015-04-03 ENCOUNTER — Telehealth: Payer: Self-pay | Admitting: Internal Medicine

## 2015-04-03 ENCOUNTER — Emergency Department (HOSPITAL_COMMUNITY)
Admission: EM | Admit: 2015-04-03 | Discharge: 2015-04-03 | Disposition: A | Discharge status: Home / Self Care | Payer: BC Managed Care – PPO | Attending: Emergency Medicine | Admitting: Emergency Medicine

## 2015-04-03 DIAGNOSIS — Z8709 Personal history of other diseases of the respiratory system: Secondary | ICD-10-CM | POA: Diagnosis not present

## 2015-04-03 DIAGNOSIS — Z87440 Personal history of urinary (tract) infections: Secondary | ICD-10-CM | POA: Diagnosis not present

## 2015-04-03 DIAGNOSIS — Z8719 Personal history of other diseases of the digestive system: Secondary | ICD-10-CM | POA: Diagnosis not present

## 2015-04-03 DIAGNOSIS — R0789 Other chest pain: Secondary | ICD-10-CM

## 2015-04-03 DIAGNOSIS — Z862 Personal history of diseases of the blood and blood-forming organs and certain disorders involving the immune mechanism: Secondary | ICD-10-CM | POA: Insufficient documentation

## 2015-04-03 DIAGNOSIS — Z792 Long term (current) use of antibiotics: Secondary | ICD-10-CM | POA: Diagnosis not present

## 2015-04-03 DIAGNOSIS — Z79899 Other long term (current) drug therapy: Secondary | ICD-10-CM | POA: Diagnosis not present

## 2015-04-03 DIAGNOSIS — Z8669 Personal history of other diseases of the nervous system and sense organs: Secondary | ICD-10-CM | POA: Insufficient documentation

## 2015-04-03 DIAGNOSIS — Z8679 Personal history of other diseases of the circulatory system: Secondary | ICD-10-CM | POA: Diagnosis not present

## 2015-04-03 DIAGNOSIS — M25474 Effusion, right foot: Secondary | ICD-10-CM | POA: Diagnosis not present

## 2015-04-03 DIAGNOSIS — M79609 Pain in unspecified limb: Secondary | ICD-10-CM | POA: Diagnosis not present

## 2015-04-03 DIAGNOSIS — R079 Chest pain, unspecified: Secondary | ICD-10-CM | POA: Diagnosis present

## 2015-04-03 LAB — CBC
HEMATOCRIT: 37.3 % (ref 36.0–46.0)
Hemoglobin: 12.9 g/dL (ref 12.0–15.0)
MCH: 30.9 pg (ref 26.0–34.0)
MCHC: 34.6 g/dL (ref 30.0–36.0)
MCV: 89.4 fL (ref 78.0–100.0)
PLATELETS: 240 10*3/uL (ref 150–400)
RBC: 4.17 MIL/uL (ref 3.87–5.11)
RDW: 13.3 % (ref 11.5–15.5)
WBC: 7 10*3/uL (ref 4.0–10.5)

## 2015-04-03 LAB — I-STAT TROPONIN, ED: TROPONIN I, POC: 0 ng/mL (ref 0.00–0.08)

## 2015-04-03 LAB — BASIC METABOLIC PANEL
Anion gap: 8 (ref 5–15)
BUN: 10 mg/dL (ref 6–20)
CHLORIDE: 106 mmol/L (ref 101–111)
CO2: 28 mmol/L (ref 22–32)
Calcium: 9.4 mg/dL (ref 8.9–10.3)
Creatinine, Ser: 0.66 mg/dL (ref 0.44–1.00)
Glucose, Bld: 88 mg/dL (ref 65–99)
POTASSIUM: 4.4 mmol/L (ref 3.5–5.1)
SODIUM: 142 mmol/L (ref 135–145)

## 2015-04-03 LAB — D-DIMER, QUANTITATIVE: D-Dimer, Quant: 0.75 ug/mL-FEU — ABNORMAL HIGH (ref 0.00–0.48)

## 2015-04-03 MED ORDER — SODIUM CHLORIDE 0.9 % IV BOLUS (SEPSIS)
1000.0000 mL | Freq: Once | INTRAVENOUS | Status: AC
  Administered 2015-04-03: 1000 mL via INTRAVENOUS

## 2015-04-03 MED ORDER — IOHEXOL 350 MG/ML SOLN
100.0000 mL | Freq: Once | INTRAVENOUS | Status: AC | PRN
  Administered 2015-04-03: 100 mL via INTRAVENOUS

## 2015-04-03 NOTE — Discharge Instructions (Signed)

## 2015-04-03 NOTE — ED Notes (Signed)
Bed: WA20 Expected date:  Expected time:  Means of arrival:  Comments: 

## 2015-04-03 NOTE — Telephone Encounter (Signed)
Pt seen in ED today

## 2015-04-03 NOTE — ED Notes (Signed)
Patient transported back from CT 

## 2015-04-03 NOTE — ED Provider Notes (Addendum)
CSN: [id number]     Arrival date & time 04/03/15  1013 History   First MD Initiated Contact with Patient 04/03/15 1103     Chief Complaint  Patient presents with  . Chest Pain  . Foot Pain     (Consider location/radiation/quality/duration/timing/severity/associated sxs/prior Treatment) HPI  59 year old female with a history of multiple sclerosis presents with chest pain every day for the past 2 weeks. She states it's a tightness in her chest that is most present whenever she takes deep inspiration. It is coming and going throughout the day. Denies dyspnea. Has had tightness in her chest like this before but it usually lasts a day or 2. Also of concern to the patient is that yesterday she developed atraumatic right foot swelling and pain. Mostly hurt on the arch of her foot and medial aspect. She elevated and soaked her foot and the swelling has improved but is not gone. She had pain radiating from her foot up her calf into her thigh. Has not noticed any calf or thigh swelling. No prior history of DVT. No urinary symptoms. Denies any new neurologic complaints.  Past Medical History  Diagnosis Date  . MS (multiple sclerosis)   . Rhinitis   . Varicose veins   . Superficial phlebitis   . Mood disorder   . Depressive reaction   . Diverticulitis   . Diverticulosis   . Anemia   . Gastropathy 2013    reactive  . Frequent UTI LAST 7/13  . Blood in stool, frank 12/01/2011   Past Surgical History  Procedure Laterality Date  . Tonsillectomy and adenoidectomy    . Bladder surgery  2008    Tack  . Abdominal hysterectomy  2008  . Shoulder arthroscopy with rotator cuff repair and subacromial decompression  06/01/2012    Procedure: SHOULDER ARTHROSCOPY WITH ROTATOR CUFF REPAIR AND SUBACROMIAL DECOMPRESSION;  Surgeon: Magnus Sinning, MD;  Location: [facility];  Service: Orthopedics;  Laterality: Left;  LEFT SHOULDER ARTHROSCOPY WITH SUBACROMIAL DECOMPRESION AND DEBRIDEMENT OF  PARTIAL ROTATOR CUFF TEAR   Family History  Problem Relation Age of Onset  . Lung cancer Father   . Colon cancer Neg Hx    Social History  Substance Use Topics  . Smoking status: Never Smoker   . Smokeless tobacco: Never Used  . Alcohol Use: Yes     Comment: occ   OB History    No data available     Review of Systems  Constitutional: Negative for fever.  Respiratory: Positive for cough (2 weeks) and chest tightness. Negative for shortness of breath.   Gastrointestinal: Negative for vomiting and abdominal pain.  Genitourinary: Negative for dysuria.  Musculoskeletal: Positive for joint swelling.  Neurological: Negative for weakness and numbness.  All other systems reviewed and are negative.     Allergies  Meclizine hcl; Codeine; Hydrocodone; Oxycodone; Prilosec; and Tramadol hcl  Home Medications   Prior to Admission medications   Medication Sig Start Date End Date Taking? Authorizing Provider  Alum Hydroxide-Mag Carbonate (GAVISCON PO) Take 1-3 tablets by mouth daily as needed (stomach acid).    Yes Historical Provider, MD  baclofen (LIORESAL) 10 MG tablet Take 5 mg by mouth daily.    Yes Historical Provider, MD  buPROPion (WELLBUTRIN XL) 300 MG 24 hr tablet Take 300 mg by mouth daily.   Yes Historical Provider, MD  cephALEXin (KEFLEX) 250 MG capsule Take 250 mg by mouth daily.   Yes Historical Provider, MD  Cholecalciferol (VITAMIN D-3)  5000 UNITS TABS Take 1 tablet by mouth daily.   Yes Historical Provider, MD  clonazePAM (KLONOPIN) 1 MG tablet Take 2 mg by mouth at bedtime.   Yes Historical Provider, MD  Diphenhydramine-APAP (TYLENOL SEVERE ALLERGY) 12.5-500 MG TABS Take 1 tablet by mouth every 4 (four) hours as needed (for allergies).   Yes Historical Provider, MD  escitalopram (LEXAPRO) 10 MG tablet Take 10 mg by mouth daily.   Yes Historical Provider, MD  esomeprazole (NEXIUM) 20 MG capsule Take 20-40 mg by mouth daily as needed (acid reflux).   Yes Historical  Provider, MD  esomeprazole (NEXIUM) 40 MG capsule Take one po daily at night. Patient taking differently: Take 40 mg by mouth daily as needed (for acid reflux). Take one po daily at night. 02/15/14  Yes [name], MD  ibuprofen (ADVIL,MOTRIN) 200 MG tablet Take 400-600 mg by mouth every 4 (four) hours as needed for moderate pain (foot pain).   Yes Historical Provider, MD  Meth-Hyo-M Bl-Na Phos-Ph Sal (URIBEL) 118 MG CAPS Take 1 capsule by mouth daily as needed (bladder spasms). Using PRN   Yes Historical Provider, MD  natalizumab (TYSABRI) 300 MG/15ML injection Inject into the vein every 30 (thirty) days.    Yes Historical Provider, MD  Probiotic Product (PROBIOTIC PEARLS) CAPS Take 1 capsule by mouth daily.   Yes Historical Provider, MD  RaNITidine HCl (ZANTAC PO) Take 1 tablet by mouth daily as needed (acid reflux).   Yes Historical Provider, MD  tiZANidine (ZANAFLEX) 4 MG tablet Take 4 mg by mouth at bedtime. 03/13/15  Yes Historical Provider, MD  ranitidine (ZANTAC) 300 MG tablet Take one po in AM Patient not taking: Reported on 01/10/2015 02/15/14   [name], MD   BP 123/72 mmHg  Temp(Src)   Resp 18  SpO2 100% Physical Exam  Constitutional: She is oriented to person, place, and time. She appears well-developed and well-nourished.  HENT:  Head: Normocephalic and atraumatic.  Right Ear: External ear normal.  Left Ear: External ear normal.  Nose: Nose normal.  Eyes: Right eye exhibits no discharge. Left eye exhibits no discharge.  Cardiovascular: Normal rate, regular rhythm and normal heart sounds.   Pulmonary/Chest: Effort normal and breath sounds normal. She has no wheezes. She has no rales.  Abdominal: Soft. She exhibits no distension. There is no tenderness.  Musculoskeletal:       Right ankle: She exhibits normal range of motion. No tenderness.       Right upper leg: She exhibits no tenderness and no swelling.       Right lower leg: She exhibits no tenderness and no  swelling.       Right foot: There is tenderness (medial) and swelling (mild).  Neurological: She is alert and oriented to person, place, and time.  Skin: Skin is warm and dry.  Nursing note and vitals reviewed.   ED Course  Procedures (including critical care time) Labs Review Labs Reviewed  D-DIMER, QUANTITATIVE (NOT AT [facility]) - Abnormal; Notable for the following:    D-Dimer, Quant 0.75 (*)    All other components within normal limits  BASIC METABOLIC PANEL  CBC  I-STAT TROPOININ, ED    Imaging Review Dg Chest 2 View  04/03/2015   CLINICAL DATA:  Chest tightness  EXAM: CHEST  2 VIEW  COMPARISON:  None.  FINDINGS: Normal heart size and mediastinal contours. No acute infiltrate or edema. No effusion or pneumothorax. No acute osseous findings.  IMPRESSION: No active cardiopulmonary  disease.   Electronically Signed   By: Monte Fantasia M.D.   On: 04/03/2015 11:23   Ct Angio Chest Pe W/cm &/or Wo Cm  04/03/2015   CLINICAL DATA:  Chest tightness for 2 weeks.  Fatigue.  EXAM: CT ANGIOGRAPHY CHEST WITH CONTRAST  TECHNIQUE: Multidetector CT imaging of the chest was performed using the standard protocol during bolus administration of intravenous contrast. Multiplanar CT image reconstructions and MIPs were obtained to evaluate the vascular anatomy.  CONTRAST:  100 mL OMNIPAQUE IOHEXOL 350 MG/ML SOLN  COMPARISON:  PA and lateral chest earlier today.  FINDINGS: No pulmonary embolus is identified. No pleural or pericardial effusion. No axillary, hilar or mediastinal lymphadenopathy. Mild aortic atherosclerosis is noted. The patient has a bovine type aortic arch. The lungs demonstrate only mild dependent atelectasis. Imaged upper abdomen is unremarkable. No bony abnormality is identified.  Review of the MIP images confirms the above findings.  IMPRESSION: Negative for pulmonary embolus. No acute disease finding to explain the patient's symptoms.   Electronically Signed   By: Inge Rise M.D.   On:  04/03/2015 14:31   Dg Foot Complete Right  04/03/2015   CLINICAL DATA:  Medial right foot pain and swelling beginning 04/02/2015. No known injury. Initial encounter.  EXAM: RIGHT FOOT COMPLETE - 3+ VIEW  COMPARISON:  None.  FINDINGS: Imaged bones, joints and soft tissues appear normal.  IMPRESSION: Negative exam.   Electronically Signed   By: Inge Rise M.D.   On: 04/03/2015 12:47   I have personally reviewed and evaluated these images and lab results as part of my medical decision-making.   EKG Interpretation   Date/Time:  Tuesday April 03 2015 10:26:11 EDT Ventricular Rate:  76 PR Interval:  145 QRS Duration: 90 QT Interval:  408 QTC Calculation: 459 R Axis:   64 Text Interpretation:  Sinus rhythm Probable left atrial enlargement Low  voltage, precordial leads no acute ST/T changes no significant change  since Nov 2013 Confirmed by Regenia Skeeter  MD, Anelis Hrivnak ([id number]) on 04/03/2015  11:04:39 AM       Expand All Collapse All   VASCULAR LAB PRELIMINARY PRELIMINARY PRELIMINARY PRELIMINARY  Right lower extremity venous duplex completed.   Preliminary report: Right: No evidence of DVT, superficial thrombosis, or Baker's cyst.  Cestone,Helene, RVT 04/03/2015, 12:57 PM      MDM   Final diagnoses:  Chest tightness  Swelling of foot joint, right    Patient's 2 weeks of chest tightness is of unclear etiology. Workup for PE and DVT are both negative. Doubt ACS. X-ray of her right foot is unremarkable. At this point patient feels well, vital signs are unremarkable, and I do not feel she has any emergent pathology. Recommend close follow-up with PCP.    Sherwood Gambler, MD 04/03/15 1603  Sherwood Gambler, MD 04/03/15 [id number]

## 2015-04-03 NOTE — ED Notes (Signed)
Per pt, states she has a history of MS-multiple complaints-states tightness in chest for 2 weeks-right arch of foot hurts, radiated up the right leg-states increased fatigue last week

## 2015-04-03 NOTE — Progress Notes (Signed)
VASCULAR LAB PRELIMINARY  PRELIMINARY  PRELIMINARY  PRELIMINARY  Right lower extremity venous duplex completed.    Preliminary report:  Right:  No evidence of DVT, superficial thrombosis, or Baker's cyst.  Enzo Treu, RVT 04/03/2015, 12:57 PM

## 2015-04-03 NOTE — Telephone Encounter (Signed)
Patient Name: [name]  DOB: February 09, 1956    Initial Comment Caller States she has tightness in chest when breathing in, pain in right leg in calf up to her right hip, right foot in the arch is very tinder and swollen. she also has MS, and low blood pressure   Nurse Assessment  Nurse: Mallie Mussel, RN, Alveta Heimlich Date/Time Eilene Ghazi Time): 04/03/2015 9:20:49 AM  Confirm and document reason for call. If symptomatic, describe symptoms. ---Caller states that she has pain in her right leg beginning yesterday. Her right foot was swollen yesterday morning. She pain in her arch of her foot, calf of her leg, and the thigh of her leg. She does have MS, but this feels different to her. She has had some tightness in her chest when breathing. She has had this for nearly 2-1/2 years. It usually last 2-3 days at a time. She is speaking in complete sentences. This happens 3-4 times per year. She rates her leg pain as 5-6 on 0-10 scale.  Has the patient traveled out of the country within the last 30 days? ---No  Does the patient require triage? ---Yes  Related visit to physician within the last 2 weeks? ---No  Does the PT have any chronic conditions? (i.e. diabetes, asthma, etc.) ---Yes  List chronic conditions. ---MS     Guidelines    Guideline Title Affirmed Question Affirmed Notes  Leg Pain Chest Pain tightness in the chest   Final Disposition User   Go to ED Now Mallie Mussel, RN, Alveta Heimlich    Referrals  [facility] - ED   Disagree/Comply: Comply

## 2015-04-17 ENCOUNTER — Telehealth: Payer: Self-pay | Admitting: Internal Medicine

## 2015-04-17 NOTE — Telephone Encounter (Signed)
Error/njr °

## 2015-04-23 ENCOUNTER — Encounter: Payer: Self-pay | Admitting: Internal Medicine

## 2015-04-23 ENCOUNTER — Ambulatory Visit (INDEPENDENT_AMBULATORY_CARE_PROVIDER_SITE_OTHER): Payer: BC Managed Care – PPO | Admitting: Internal Medicine

## 2015-04-23 VITALS — BP 110/72 | HR 77 | Temp 98.1°F | Wt 169.0 lb

## 2015-04-23 DIAGNOSIS — Z23 Encounter for immunization: Secondary | ICD-10-CM | POA: Diagnosis not present

## 2015-04-23 DIAGNOSIS — L659 Nonscarring hair loss, unspecified: Secondary | ICD-10-CM | POA: Diagnosis not present

## 2015-04-23 DIAGNOSIS — Z8249 Family history of ischemic heart disease and other diseases of the circulatory system: Secondary | ICD-10-CM | POA: Diagnosis not present

## 2015-04-23 DIAGNOSIS — Z13220 Encounter for screening for lipoid disorders: Secondary | ICD-10-CM

## 2015-04-23 DIAGNOSIS — R0789 Other chest pain: Secondary | ICD-10-CM | POA: Diagnosis not present

## 2015-04-23 DIAGNOSIS — G35 Multiple sclerosis: Secondary | ICD-10-CM

## 2015-04-23 LAB — LIPID PANEL
Cholesterol: 181 mg/dL (ref 0–200)
HDL: 49.7 mg/dL (ref >39.00)
NONHDL: 131.59
TRIGLYCERIDES: 222 mg/dL — AB (ref 0.0–149.0)
Total CHOL/HDL Ratio: 4
VLDL: 44.4 mg/dL — AB (ref 0.0–40.0)

## 2015-04-23 LAB — T4, FREE: FREE T4: 0.76 ng/dL (ref 0.60–1.60)

## 2015-04-23 LAB — LDL CHOLESTEROL, DIRECT: LDL DIRECT: 110 mg/dL

## 2015-04-23 LAB — TSH: TSH: 1.13 u[IU]/mL (ref 0.35–4.50)

## 2015-04-23 NOTE — Progress Notes (Signed)
Pre visit review using our clinic review tool, if applicable. No additional management support is needed unless otherwise documented below in the visit note. 

## 2015-04-23 NOTE — Progress Notes (Signed)
Chief Complaint  Patient presents with  . Follow-up    ED visit     HPI: [name] 59 y.o. Patient come in for follow up from ED visit  Patient's  Had 2 weeks of chest tightness is of unclear etiology. Workup for PE and DVT are both negative. Doubt ACS. X-ray of her right foot is unremarkable. At this point patient feels well, vital signs are unremarkable, and I do not feel she has any emergent pathology. Recommend close follow-up with PCP.  Sherwood Gambler, MD 04/03/15 1603  Elevated d dimer neg doppler  ekg ? Lae  Neg ct chest. Since e then a lot better   Felt like if breath in air polution   Had cough bno wheeze . Here for fu  Ms about the same    hair thinning recently ? If thyroid ok  No dysphagia but   Only taking generic nexium as needed  Mom fam hx of CAD  ROS: See pertinent positives and negatives per HPI.  Past Medical History  Diagnosis Date  . MS (multiple sclerosis)   . Rhinitis   . Varicose veins   . Superficial phlebitis   . Mood disorder   . Depressive reaction   . Diverticulitis   . Diverticulosis   . Anemia   . Gastropathy 2013    reactive  . Frequent UTI LAST 7/13  . Blood in stool, frank 12/01/2011    Family History  Problem Relation Age of Onset  . Lung cancer Father   . Colon cancer Neg Hx     Social History   Social History  . Marital Status: Married    Spouse Name: N/A  . Number of Children: 4  . Years of Education: N/A   Occupational History  . Disability    Social History Main Topics  . Smoking status: Never Smoker   . Smokeless tobacco: Never Used  . Alcohol Use: Yes     Comment: occ  . Drug Use: No  . Sexual Activity: Not Asked   Other Topics Concern  . None   Social History Narrative   Unable to volunteer    Now on disability   Children now out of home   Daily caffeine     Outpatient Prescriptions Prior to Visit  Medication Sig Dispense Refill  . Alum Hydroxide-Mag Carbonate (GAVISCON PO) Take 1-3  tablets by mouth daily as needed (stomach acid).     . baclofen (LIORESAL) 10 MG tablet Take 5 mg by mouth daily.     Marland Kitchen buPROPion (WELLBUTRIN XL) 300 MG 24 hr tablet Take 300 mg by mouth daily.    . cephALEXin (KEFLEX) 250 MG capsule Take 250 mg by mouth daily.    . Cholecalciferol (VITAMIN D-3) 5000 UNITS TABS Take 1 tablet by mouth daily.    . clonazePAM (KLONOPIN) 1 MG tablet Take 2 mg by mouth at bedtime.    . Diphenhydramine-APAP (TYLENOL SEVERE ALLERGY) 12.5-500 MG TABS Take 1 tablet by mouth every 4 (four) hours as needed (for allergies).    Marland Kitchen escitalopram (LEXAPRO) 10 MG tablet Take 10 mg by mouth daily.    Marland Kitchen esomeprazole (NEXIUM) 20 MG capsule Take 20-40 mg by mouth daily as needed (acid reflux).    Marland Kitchen ibuprofen (ADVIL,MOTRIN) 200 MG tablet Take 400-600 mg by mouth every 4 (four) hours as needed for moderate pain (foot pain).    . Meth-Hyo-M Bl-Na Phos-Ph Sal (URIBEL) 118 MG CAPS Take 1 capsule by mouth  daily as needed (bladder spasms). Using PRN    . natalizumab (TYSABRI) 300 MG/15ML injection Inject into the vein every 30 (thirty) days.     . Probiotic Product (PROBIOTIC PEARLS) CAPS Take 1 capsule by mouth daily.    . ranitidine (ZANTAC) 300 MG tablet Take one po in AM 30 tablet 0  . RaNITidine HCl (ZANTAC PO) Take 1 tablet by mouth daily as needed (acid reflux).    Marland Kitchen tiZANidine (ZANAFLEX) 4 MG tablet Take 4 mg by mouth at bedtime.    Marland Kitchen esomeprazole (NEXIUM) 40 MG capsule Take one po daily at night. (Patient taking differently: Take 40 mg by mouth daily as needed (for acid reflux). Take one po daily at night.) 30 capsule 0   No facility-administered medications prior to visit.     EXAM:  BP 110/72 mmHg  Pulse 77  Temp(Src) 98.1 F (36.7 C) (Oral)  Wt 169 lb (76.658 kg)  SpO2 98%  Body mass index is 28.12 kg/(m^2).  GENERAL: vitals reviewed and listed above, alert, oriented, appears well hydrated and in no acute distress HEENT: atraumatic, conjunctiva  clear, no obvious  abnormalities on inspection of external nose and ears OP : no lesion edema or exudate  NECK: no obvious masses on inspection palpation  LUNGS: clear to auscultation bilaterally, no wheezes, rales or rhonchi, good air movement CV: HRRR, no clubbing cyanosis or  peripheral edema nl cap refill  MS: moves all extremities without noticeable focal  abnormality PSYCH: pleasant and cooperative, no obvious depression or anxiety Lab Results  Component Value Date   WBC 7.0 04/03/2015   HGB 12.9 04/03/2015   HCT 37.3 04/03/2015   PLT 240 04/03/2015   GLUCOSE 88 04/03/2015   CHOL 181 04/23/2015   TRIG 222.0* 04/23/2015   HDL 49.70 04/23/2015   LDLDIRECT 110.0 04/23/2015   LDLCALC 112* 02/13/2014   ALT 29 02/13/2014   AST 26 02/13/2014   NA 142 04/03/2015   K 4.4 04/03/2015   CL 106 04/03/2015   CREATININE 0.66 04/03/2015   BUN 10 04/03/2015   CO2 28 04/03/2015   TSH 1.13 04/23/2015   HGBA1C 5.2 02/13/2014   ED note and data reviewed  ASSESSMENT AND PLAN:  Discussed the following assessment and plan:  Feeling of chest tightness - Plan: TSH, T4, free, Lipid panel  Thinning hair - Plan: TSH, T4, free  Need for prophylactic vaccination and inoculation against influenza - Plan: Flu Vaccine QUAD 36+ mos IM  Fam hx-ischem heart disease - Plan: Lipid panel  SCLEROSIS, MULTIPLE  Screening for lipid disorders - Plan: Lipid panel Neg cvt eval. -Patient advised to return or notify health care team  if symptoms worsen ,persist or new concerns arise. Total visit 53mins > 50% spent counseling and coordinating care as indicated in above note and in instructions to patient .    Patient Instructions  Exam and labs reassuring  Agree check thyroid status today ,  consider reflux and poss reactive  irritable airway .   Try taking  nexium or zantac if recurs and get back with [place] i fn eeded for this  I dont think further work up n eeded at this  time but if recurring can get cardiology to evaluate   Because of  Your family hx risk.   Will notify you  of labs when available. Will review record and let you know if any other action needed .     [name]. [name] M.D.

## 2015-04-23 NOTE — Patient Instructions (Addendum)
Exam and labs reassuring  Agree check thyroid status today ,  consider reflux and poss reactive  irritable airway .   Try taking  nexium or zantac if recurs and get back with [place] i fn eeded for this  I dont think further work up n eeded at this  time but if recurring can get cardiology to evaluate  Because of  Your family hx risk.   Will notify you  of labs when available. Will review record and let you know if any other action needed .

## 2015-07-11 ENCOUNTER — Ambulatory Visit: Payer: BC Managed Care – PPO | Admitting: Podiatry

## 2015-07-11 ENCOUNTER — Ambulatory Visit (INDEPENDENT_AMBULATORY_CARE_PROVIDER_SITE_OTHER): Payer: BC Managed Care – PPO | Admitting: Podiatry

## 2015-07-11 DIAGNOSIS — B351 Tinea unguium: Secondary | ICD-10-CM

## 2015-07-11 MED ORDER — TERBINAFINE HCL 250 MG PO TABS: ORAL_TABLET | ORAL | Status: DC

## 2015-07-12 ENCOUNTER — Emergency Department (HOSPITAL_COMMUNITY): Payer: BC Managed Care – PPO

## 2015-07-12 ENCOUNTER — Encounter (HOSPITAL_COMMUNITY): Payer: Self-pay | Admitting: Emergency Medicine

## 2015-07-12 ENCOUNTER — Emergency Department (HOSPITAL_COMMUNITY)
Admission: EM | Admit: 2015-07-12 | Discharge: 2015-07-13 | Disposition: A | Discharge status: Home / Self Care | Payer: BC Managed Care – PPO | Attending: Emergency Medicine | Admitting: Emergency Medicine

## 2015-07-12 DIAGNOSIS — Z79899 Other long term (current) drug therapy: Secondary | ICD-10-CM | POA: Diagnosis not present

## 2015-07-12 DIAGNOSIS — W010XXA Fall on same level from slipping, tripping and stumbling without subsequent striking against object, initial encounter: Secondary | ICD-10-CM | POA: Insufficient documentation

## 2015-07-12 DIAGNOSIS — Z23 Encounter for immunization: Secondary | ICD-10-CM | POA: Diagnosis not present

## 2015-07-12 DIAGNOSIS — Z87440 Personal history of urinary (tract) infections: Secondary | ICD-10-CM | POA: Insufficient documentation

## 2015-07-12 DIAGNOSIS — Z792 Long term (current) use of antibiotics: Secondary | ICD-10-CM | POA: Insufficient documentation

## 2015-07-12 DIAGNOSIS — S0181XA Laceration without foreign body of other part of head, initial encounter: Secondary | ICD-10-CM | POA: Diagnosis present

## 2015-07-12 DIAGNOSIS — Z8719 Personal history of other diseases of the digestive system: Secondary | ICD-10-CM | POA: Diagnosis not present

## 2015-07-12 DIAGNOSIS — Z8679 Personal history of other diseases of the circulatory system: Secondary | ICD-10-CM | POA: Insufficient documentation

## 2015-07-12 DIAGNOSIS — G35 Multiple sclerosis: Secondary | ICD-10-CM | POA: Diagnosis not present

## 2015-07-12 DIAGNOSIS — Y998 Other external cause status: Secondary | ICD-10-CM | POA: Diagnosis not present

## 2015-07-12 DIAGNOSIS — Y9301 Activity, walking, marching and hiking: Secondary | ICD-10-CM | POA: Insufficient documentation

## 2015-07-12 DIAGNOSIS — Y9222 Religious institution as the place of occurrence of the external cause: Secondary | ICD-10-CM | POA: Diagnosis not present

## 2015-07-12 DIAGNOSIS — S0990XA Unspecified injury of head, initial encounter: Secondary | ICD-10-CM

## 2015-07-12 DIAGNOSIS — F329 Major depressive disorder, single episode, unspecified: Secondary | ICD-10-CM | POA: Insufficient documentation

## 2015-07-12 DIAGNOSIS — W19XXXA Unspecified fall, initial encounter: Secondary | ICD-10-CM

## 2015-07-12 MED ORDER — LIDOCAINE-EPINEPHRINE (PF) 2 %-1:200000 IJ SOLN
10.0000 mL | Freq: Once | INTRAMUSCULAR | Status: AC
  Administered 2015-07-13: 10 mL

## 2015-07-12 MED ORDER — LIDOCAINE-EPINEPHRINE (PF) 2 %-1:200000 IJ SOLN
INTRAMUSCULAR | Status: AC
  Filled 2015-07-12: qty 20

## 2015-07-12 MED ORDER — IBUPROFEN 200 MG PO TABS
600.0000 mg | ORAL_TABLET | Freq: Once | ORAL | Status: AC
  Administered 2015-07-13: 600 mg via ORAL
  Filled 2015-07-12: qty 3

## 2015-07-12 MED ORDER — IBUPROFEN 600 MG PO TABS: 600.0000 mg | ORAL_TABLET | Freq: Four times a day (QID) | ORAL | Status: DC | PRN

## 2015-07-12 MED ORDER — TETANUS-DIPHTH-ACELL PERTUSSIS 5-2.5-18.5 LF-MCG/0.5 IM SUSP
0.5000 mL | Freq: Once | INTRAMUSCULAR | Status: AC
  Administered 2015-07-12: 0.5 mL via INTRAMUSCULAR

## 2015-07-12 NOTE — Discharge Instructions (Signed)
Please read and follow all provided instructions.  Your diagnoses today include:  1. Fall, initial encounter   2. Head injury, initial encounter   3. Facial laceration, initial encounter     Tests performed today include:  X-ray of the affected area that did not show any foreign bodies or broken bones  CT Scan of your Head and Face  Vital signs. See below for your results today.   Medications prescribed:   Take any prescribed medications only as directed.   Home care instructions:  Follow any educational materials and wound care instructions contained in this packet.   You may shower and wash the area with soap and water, just be sure to pat the area dry and not rub over the stitches. Do no put your stiches underwater (in a bath, pool, or lake). Getting stiches wet can slow down healing and increase your chances of getting an infection. You may apply Bacitracin or Neosporin twice a day for 7 days, and keep the ara clean with  bandage or gauze. Do not apply alcohol or hydrogen peroxide. Cover the area if it draining or weeping.   Follow-up instructions: Suture Removal: Return to the Emergency Department or see your primary care care doctor in 7 days for removal of sutures on the chin and 5 days for sutures on the brow for a recheck of your wound and removal of your sutures or staples.    Return instructions:  Return to the Emergency Department if you have:  Fever  Worsening pain  Worsening swelling of the wound  Pus draining from the wound  Redness of the skin that moves away from the wound, especially if it streaks away from the affected area   Any other emergent concerns  Your vital signs today were: BP 172/65 mmHg   Pulse 76   Temp(Src) 97.5 F (36.4 C) (Oral)   Resp 16   SpO2 99% If your blood pressure (BP) was elevated above 135/85 this visit, please have this repeated by your doctor within one month. --------------

## 2015-07-12 NOTE — Progress Notes (Signed)
Subjective:     Patient ID: [name], female   DOB: 1956/06/30, 59 y.o.   MRN: [id number]  HPI patient states it was doing quite a bit better but now it has started to reinfect   Review of Systems     Objective:   Physical Exam Neurovascular status intact with some discoloration of the lesser nailbeds and the hallux right distal two thirds of the nailbed    Assessment:     Moderate reoccurrence of mycotic nail infection    Plan:     Laser administered at this time and we'll start on a Lamisil pills pack to try to help with the nail condition. May need to redo laser again next year and advised patient on how to take the pulse pack properly

## 2015-07-12 NOTE — ED Notes (Signed)
MD at bedside. 

## 2015-07-12 NOTE — ED Notes (Signed)
Patient transported to X-ray 

## 2015-07-12 NOTE — ED Notes (Signed)
INITIAL ASSESSMENT COMPLETED. PT C/O A FALL WITH PAIN TO THE FACE WITH LACERATIONS TO THE LEFT EYEBROW AND UNDER THE CHIN. DENIES LOC. PT ALSO C/O PAIN TO THE MID-CHEST AND LEFT HAND. AWAITING FURTHER ORDERS.

## 2015-07-12 NOTE — ED Provider Notes (Signed)
CSN: [id number]     Arrival date & time 07/12/15  2126 History   First MD Initiated Contact with Patient 07/12/15 2154     Chief Complaint  Patient presents with  . Fall  . Facial Laceration   (Consider location/radiation/quality/duration/timing/severity/associated sxs/prior Treatment) HPI 59 y.o. female with  has a past medical history of MS (multiple sclerosis) ([place]); Rhinitis; Varicose veins; Superficial phlebitis; Mood disorder ([place]); Depressive reaction; Diverticulitis; Diverticulosis; Anemia; Gastropathy (2013); Frequent UTI (LAST 7/13); and Blood in stool, frank (12/01/2011)., presents to the Emergency Department today complaining of a facial laceration after a fall. Pt was at church this evening around 8:30p when she fell. She was walking down the concrete driveway and tripped on an elevated portion of the concrete. No LOC. No Seizure. Does report headache. No vision changes. Not on blood thinners. Sustained a lac over L eye and under her chin. She complains of pain to her chest in the middle. Pain is 4/10. Does not remember last tetanus.    Past Medical History  Diagnosis Date  . MS (multiple sclerosis) ([place])   . Rhinitis   . Varicose veins   . Superficial phlebitis   . Mood disorder ([place])   . Depressive reaction   . Diverticulitis   . Diverticulosis   . Anemia   . Gastropathy 2013    reactive  . Frequent UTI LAST 7/13  . Blood in stool, frank 12/01/2011   Past Surgical History  Procedure Laterality Date  . Tonsillectomy and adenoidectomy    . Bladder surgery  2008    Tack  . Abdominal hysterectomy  2008  . Shoulder arthroscopy with rotator cuff repair and subacromial decompression  06/01/2012    Procedure: SHOULDER ARTHROSCOPY WITH ROTATOR CUFF REPAIR AND SUBACROMIAL DECOMPRESSION;  Surgeon: Magnus Sinning, MD;  Location: [facility];  Service: Orthopedics;  Laterality: Left;  LEFT SHOULDER ARTHROSCOPY WITH SUBACROMIAL DECOMPRESION AND DEBRIDEMENT OF PARTIAL  ROTATOR CUFF TEAR  . Abdominal hysterectomy     Family History  Problem Relation Age of Onset  . Lung cancer Father   . Colon cancer Neg Hx    Social History  Substance Use Topics  . Smoking status: Never Smoker   . Smokeless tobacco: Never Used  . Alcohol Use: Yes     Comment: occ   OB History    No data available     Review of Systems  Constitutional: Negative for fever, chills, diaphoresis and fatigue.  HENT: Negative for congestion, sinus pressure, sore throat and tinnitus.   Eyes: Negative for visual disturbance.  Respiratory: Negative for cough and shortness of breath.   Cardiovascular: Negative for chest pain.  Gastrointestinal: Negative for nausea, vomiting, abdominal pain, diarrhea and constipation.  Endocrine: Negative for cold intolerance and heat intolerance.  Musculoskeletal: Negative for back pain.  Skin: Negative for color change.  Neurological: Positive for headaches. Negative for dizziness, syncope, weakness and numbness.   Allergies  Meclizine hcl; Codeine; Hydrocodone; Oxycodone; Prilosec; and Tramadol hcl  Home Medications   Prior to Admission medications   Medication Sig Start Date End Date Taking? Authorizing Provider  Alum Hydroxide-Mag Carbonate (GAVISCON PO) Take 1-3 tablets by mouth daily as needed (stomach acid).     Historical Provider, MD  baclofen (LIORESAL) 10 MG tablet Take 5 mg by mouth daily.     Historical Provider, MD  buPROPion (WELLBUTRIN XL) 300 MG 24 hr tablet Take 300 mg by mouth daily.    Historical Provider, MD  cephALEXin [id number])  250 MG capsule Take 250 mg by mouth daily.    Historical Provider, MD  Cholecalciferol (VITAMIN D-3) 5000 UNITS TABS Take 1 tablet by mouth daily.    Historical Provider, MD  clonazePAM (KLONOPIN) 1 MG tablet Take 2 mg by mouth at bedtime.    Historical Provider, MD  Diphenhydramine-APAP (TYLENOL SEVERE ALLERGY) 12.5-500 MG TABS Take 1 tablet by mouth every 4 (four) hours as needed (for allergies).     Historical Provider, MD  escitalopram (LEXAPRO) 10 MG tablet Take 10 mg by mouth daily.    Historical Provider, MD  esomeprazole (NEXIUM) 20 MG capsule Take 20-40 mg by mouth daily as needed (acid reflux).    Historical Provider, MD  ibuprofen (ADVIL,MOTRIN) 200 MG tablet Take 400-600 mg by mouth every 4 (four) hours as needed for moderate pain (foot pain).    Historical Provider, MD  Meth-Hyo-M Bl-Na Phos-Ph Sal (URIBEL) 118 MG CAPS Take 1 capsule by mouth daily as needed (bladder spasms). Using PRN    Historical Provider, MD  natalizumab (TYSABRI) 300 MG/15ML injection Inject into the vein every 30 (thirty) days.     Historical Provider, MD  Probiotic Product (PROBIOTIC PEARLS) CAPS Take 1 capsule by mouth daily.    Historical Provider, MD  ranitidine (ZANTAC) 300 MG tablet Take one po in AM 02/15/14   [name], MD  RaNITidine HCl (ZANTAC PO) Take 1 tablet by mouth daily as needed (acid reflux).    Historical Provider, MD  terbinafine (LAMISIL) 250 MG tablet Take one tablet once daily x 7 days, then repeat every month x 4 months 07/11/15   Tamala Fothergill Regal, DPM  tiZANidine (ZANAFLEX) 4 MG tablet Take 4 mg by mouth at bedtime. 03/13/15   Historical Provider, MD   BP 172/65 mmHg  Pulse 76  Temp(Src) 97.5 F (36.4 C) (Oral)  Resp 16  SpO2 99%   Physical Exam  Constitutional: She is oriented to person, place, and time. She appears well-developed and well-nourished.  HENT:  Head: Normocephalic. Head is with laceration. Head is without raccoon's eyes, without Battle's sign, without right periorbital erythema and without left periorbital erythema.    Right Ear: Hearing and tympanic membrane normal. No hemotympanum.  Left Ear: Hearing and tympanic membrane normal. No hemotympanum.  Nose:    Mouth/Throat: Uvula is midline, oropharynx is clear and moist and mucous membranes are normal.  Eyes: Pupils are equal, round, and reactive to light.  Neck: Normal range of motion. Neck supple.    Cardiovascular: Normal rate and regular rhythm.   Pulmonary/Chest: Effort normal and breath sounds normal.  Abdominal: Soft.  Neurological: She is alert and oriented to person, place, and time.  Skin: Skin is warm and dry.  Psychiatric: She has a normal mood and affect. Her behavior is normal.    ED Course  Procedures (including critical care time)  LACERATION REPAIR Performed by: Ozella Rocks Authorized by: Ozella Rocks Consent: Verbal consent obtained. Risks and benefits: risks, benefits and alternatives were discussed Consent given by: Patient Patient identity confirmed: provided demographic data Prepped and Draped in normal sterile fashion Wound explored  Laceration Location: L Superior to eyebrow and underneath chin   Laceration Length: 3cm and 4 cm respectively   No Foreign Bodies seen or palpated  Anesthesia: local infiltration  Local anesthetic: lidocaine 2% w/ epinephrine  Anesthetic total: 2 ml  Irrigation method: syringe Amount of cleaning: standard  Skin closure: 5-0 Prolene to chin and 6-0 Prolene to brow. Complete  Number of sutures: 6 total (3 chin, 3 brow)  Technique: simple interrupted   Patient tolerance: Patient tolerated the procedure well with no immediate complications.  Definitive fracture care performed for the nasal bones. This included analgesia in the ED, nasal decongestant, and prescriptions for outpatient pain control which have been provided. I have counseled the pt on possible complications of the fractures and signs and symptoms which would mandate return for further care as well as the utility of RICE therapy. The patient has expressed their understanding.  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 Can't remember if she hit anything. Substernal pain. History of MS. EXAM: CHEST  2 VIEW COMPARISON:  CT chest 04/03/2015.  Chest  04/03/2015. FINDINGS: The heart size and mediastinal contours are within normal limits. Both lungs are clear. The visualized skeletal structures are unremarkable. IMPRESSION: No active cardiopulmonary disease. Electronically Signed   By: Lucienne Capers M.D.   On: 07/12/2015 22:47   Ct Head Wo Contrast  07/12/2015  CLINICAL DATA:  Tripped over diffusely concrete and fell. Laceration over the left orbit, and laceration under the chin. Epistaxis. Initial encounter. EXAM: CT HEAD WITHOUT CONTRAST CT MAXILLOFACIAL WITHOUT CONTRAST TECHNIQUE: Multidetector CT imaging of the head and maxillofacial structures were performed using the standard protocol without intravenous contrast. Multiplanar CT image reconstructions of the maxillofacial structures were also generated. COMPARISON:  None. FINDINGS: CT HEAD FINDINGS There is no evidence of acute infarction, mass lesion, or intra- or extra-axial hemorrhage on CT. Mild periventricular white matter change likely reflects small vessel ischemic microangiopathy. The posterior fossa, including the cerebellum, brainstem and fourth ventricle, is within normal limits. The third and lateral ventricles, and basal ganglia are unremarkable in appearance. The cerebral hemispheres are symmetric in appearance, with normal gray-white differentiation. No mass effect or midline shift is seen. There is no evidence of fracture; visualized osseous structures are unremarkable in appearance. The orbits are within normal limits. The paranasal sinuses and mastoid air cells are well-aerated. Mild soft tissue swelling is noted overlying the left frontal calvarium. CT MAXILLOFACIAL FINDINGS There is no evidence of fracture or dislocation. The maxilla and mandible appear intact. The nasal bone is unremarkable in appearance. The visualized dentition demonstrates no acute abnormality. The orbits are intact bilaterally. The visualized paranasal sinuses and mastoid air cells are well-aerated. A soft  tissue laceration is noted at the left side of the chin, and mild soft tissue swelling is noted overlying the left frontal calvarium. The parapharyngeal fat planes are preserved. The nasopharynx, oropharynx and hypopharynx are unremarkable in appearance. The visualized portions of the valleculae and piriform sinuses are grossly unremarkable. The parotid and submandibular glands are within normal limits. No cervical lymphadenopathy is seen. IMPRESSION: 1. No evidence of traumatic intracranial injury or fracture. 2. No evidence of fracture or dislocation with regard to the maxillofacial structures. 3. Soft tissue laceration at the left side of the chin, and mild soft tissue swelling overlying the frontal calvarium. 4. Mild small vessel ischemic microangiopathy. Electronically Signed   By: Garald Balding M.D.   On: 07/12/2015 23:46   Ct Maxillofacial Wo Cm  07/12/2015  CLINICAL DATA:  Tripped over diffusely concrete and fell. Laceration over the left orbit, and laceration under the chin. Epistaxis. Initial encounter. EXAM: CT HEAD WITHOUT CONTRAST CT MAXILLOFACIAL WITHOUT CONTRAST TECHNIQUE: Multidetector CT imaging of the head and maxillofacial structures were performed using  the standard protocol without intravenous contrast. Multiplanar CT image reconstructions of the maxillofacial structures were also generated. COMPARISON:  None. FINDINGS: CT HEAD FINDINGS There is no evidence of acute infarction, mass lesion, or intra- or extra-axial hemorrhage on CT. Mild periventricular white matter change likely reflects small vessel ischemic microangiopathy. The posterior fossa, including the cerebellum, brainstem and fourth ventricle, is within normal limits. The third and lateral ventricles, and basal ganglia are unremarkable in appearance. The cerebral hemispheres are symmetric in appearance, with normal gray-white differentiation. No mass effect or midline shift is seen. There is no evidence of fracture; visualized  osseous structures are unremarkable in appearance. The orbits are within normal limits. The paranasal sinuses and mastoid air cells are well-aerated. Mild soft tissue swelling is noted overlying the left frontal calvarium. CT MAXILLOFACIAL FINDINGS There is no evidence of fracture or dislocation. The maxilla and mandible appear intact. The nasal bone is unremarkable in appearance. The visualized dentition demonstrates no acute abnormality. The orbits are intact bilaterally. The visualized paranasal sinuses and mastoid air cells are well-aerated. A soft tissue laceration is noted at the left side of the chin, and mild soft tissue swelling is noted overlying the left frontal calvarium. The parapharyngeal fat planes are preserved. The nasopharynx, oropharynx and hypopharynx are unremarkable in appearance. The visualized portions of the valleculae and piriform sinuses are grossly unremarkable. The parotid and submandibular glands are within normal limits. No cervical lymphadenopathy is seen. IMPRESSION: 1. No evidence of traumatic intracranial injury or fracture. 2. No evidence of fracture or dislocation with regard to the maxillofacial structures. 3. Soft tissue laceration at the left side of the chin, and mild soft tissue swelling overlying the frontal calvarium. 4. Mild small vessel ischemic microangiopathy. Electronically Signed   By: Garald Balding M.D.   On: 07/12/2015 23:46   I have personally reviewed and evaluated these images and lab results as part of my medical decision-making.   EKG Interpretation None     MDM  I have reviewed relevant laboratory values.I have reviewed relevant imaging studies. I have reviewed the relevant previous healthcare records. I obtained HPI from historian. Cases discussed with Attending Physician  ED Course: CT Head/Maxilofacial, CXR  6-0 Prolene to lacerated area on brow and 5-0 Prolene on chin Given Ibuprofen for analgesia     Assessment: 59y F pmh MS presents  after a Fall. No LOC. No seizure. Has h/a so will r/o SAH. Hx rules out cardiogenic or neurologic cause. Will CT Head/Face for acute fx due to lacerations.   Disposition/Plan:  D/C home with f/u with PCP for further management of symptoms in the next 48 hours      Patient was discussed with Noemi Chapel, MD   Final diagnoses:  Fall, initial encounter  Head injury, initial encounter  Facial laceration, initial encounter     Shary Decamp, PA-C 07/13/15 2150  Noemi Chapel, MD 07/15/15 1335

## 2015-07-12 NOTE — ED Notes (Signed)
Pt has swelling and abrasions noted to her nose and her lower lip has a small laceration with swelling noted as well

## 2015-07-12 NOTE — ED Provider Notes (Signed)
The patient is a 59 year old female who had a mechanical fall where she tripped falling forward, she has multiple sclerosis, she was unable to catch herself with her hands and took the brunt of the injury on her nasal bridge, forehead and her chin. On exam the patient does have a laceration to the left forehead above the brow, the chin as well as tenderness over the bridge of the nose with a hematoma. There is no nasal septal hematoma, she has no malocclusion, she has no hemotympanum, no raccoon eyes, no battle sign. She has mild tenderness over the xiphoid process. Neuro neurologic exam is unremarkable, she is able to raise both arms without difficulty, normal straight leg raise bilaterally, normal mental status, memory and behavior. She will receive imaging to rule out fracture of the face, intracranial hemorrhage, tetanus update and primary repair by physician assistant.  CT scan reviewed, appears to have a possible minimally to nasal fracture. No other signs of traumatic injury to the maxillofacial structures or the skull.   Medical screening examination/treatment/procedure(s) were conducted as a shared visit with non-physician practitioner(s) and myself.  I personally evaluated the patient during the encounter.  Clinical Impression:   Final diagnoses:  Fall, initial encounter  Head injury, initial encounter  Facial laceration, initial encounter         Noemi Chapel, MD 07/15/15 1335

## 2015-07-12 NOTE — ED Notes (Signed)
Pt states she was at [employer] and tripped over a piece of concrete and fell  Pt has a laceration noted over her left eye and one under her chin  Pt is c/o pain to her chest area from where she landed on it  Pt is also c/o pain to her left hand  Pt has abrasions noted to her hand  Pt states she also had a nosebleed  Bleeding controlled at this time  Pt denies LOC

## 2015-07-12 NOTE — ED Notes (Signed)
Patient transported to CT 

## 2015-07-16 ENCOUNTER — Telehealth: Payer: Self-pay | Admitting: Internal Medicine

## 2015-07-16 NOTE — Telephone Encounter (Signed)
Ms. Sholar called saying per the ER doctor, she really needed to have her brow stitches removed on tomorrow due to scarring and she'd like to keep the originally scheduled appt to have that done. She also wants to keep her appt on Thursday to have the stitches from her chin removed. She said unless Dr. Sarajane Jews has read the ER notes and strongly recommends she wait until Thursday for both...she wants to keep both appts. Please give her a call regarding this if needed.   Pt's ph# [phone] Thank you.

## 2015-07-16 NOTE — Telephone Encounter (Signed)
Left a detailed message on patient's answering machine explaining the canc of the Tuesday appt.

## 2015-07-16 NOTE — Telephone Encounter (Signed)
Pt rescheduled her brow stiches removal appt for 12.20.16. Thank you.

## 2015-07-16 NOTE — Telephone Encounter (Signed)
Per Dr. Sarajane Jews, pt only needs to be seen on Thursday, cancel Tuesday appointment.

## 2015-07-16 NOTE — Telephone Encounter (Signed)
This is Dr. Regis Bill patient but she was in the ER this weekend and need stitches taken out tomorrow and on Thursday.  Dr. Regis Bill is out all week so she was put with Dr. Sarajane Jews on each day.  The patient was informed that we would check with Dr. Sarajane Jews to see if these appts would be okay. Please advise.

## 2015-07-16 NOTE — Telephone Encounter (Signed)
Per Dr. Sarajane Jews, okay if she want's to keep both appointments.

## 2015-07-17 ENCOUNTER — Ambulatory Visit: Payer: BC Managed Care – PPO | Admitting: Family Medicine

## 2015-07-19 ENCOUNTER — Ambulatory Visit (INDEPENDENT_AMBULATORY_CARE_PROVIDER_SITE_OTHER): Payer: BC Managed Care – PPO | Admitting: Family Medicine

## 2015-07-19 ENCOUNTER — Encounter: Payer: Self-pay | Admitting: Family Medicine

## 2015-07-19 VITALS — BP 126/78 | HR 74 | Temp 98.5°F | Ht 65.0 in | Wt 170.0 lb

## 2015-07-19 DIAGNOSIS — S0181XD Laceration without foreign body of other part of head, subsequent encounter: Secondary | ICD-10-CM

## 2015-07-19 NOTE — Progress Notes (Signed)
   Subjective:    Patient ID: [name], female    DOB: 06/25/1956, 59 y.o.   MRN: [id number]  HPI Here to follow up on injuries sustained when she fell on 07-12-15. She lost er balance and fell forward, injuring several places around the face and chin. At the ER she had sutures placed. CT scan of the head and maxillofacial structures were negative. Since then she has had some soreness of the forehead and the chin, but nothing of consequence.    Review of Systems  Constitutional: Negative.   HENT: Negative.   Eyes: Negative.   Skin: Positive for wound.  Neurological: Negative.        Objective:   Physical Exam  Constitutional: She appears well-developed and well-nourished.  HENT:  Right Ear: External ear normal.  Left Ear: External ear normal.  Nose: Nose normal.  Mouth/Throat: Oropharynx is clear and moist.  Eyes: Conjunctivae and EOM are normal. Pupils are equal, round, and reactive to light.  Neck: Neck supple. No thyromegaly present.  Lymphadenopathy:    She has no cervical adenopathy.  Skin:  She has some mild ecchymoses around the forehead, nose, and chin. She has sutures along a laceration above the left eyebrow and also along the chin. The wounds are clean and healing well.           Assessment & Plan:  She has had lacerations to the forehead and the chin which are healing nicely. All 7 sutures were removed today. She will begin to apply vitamin D ointment to the wounds BID.

## 2015-07-19 NOTE — Progress Notes (Signed)
Pre visit review using our clinic review tool, if applicable. No additional management support is needed unless otherwise documented below in the visit note. 

## 2015-07-20 ENCOUNTER — Ambulatory Visit: Payer: BC Managed Care – PPO | Admitting: Family Medicine

## 2015-07-31 DIAGNOSIS — H43813 Vitreous degeneration, bilateral: Secondary | ICD-10-CM | POA: Diagnosis not present

## 2015-07-31 DIAGNOSIS — Z79899 Other long term (current) drug therapy: Secondary | ICD-10-CM | POA: Diagnosis not present

## 2015-08-02 ENCOUNTER — Telehealth: Payer: Self-pay | Admitting: *Deleted

## 2015-08-02 MED ORDER — TERBINAFINE HCL 250 MG PO TABS: ORAL_TABLET | ORAL | Status: DC

## 2015-08-02 NOTE — Telephone Encounter (Signed)
Pt states she is calling about confusion with her Lamisil prescription.  Pt states she had planned to begin the Lamisil pulses on 07/30/2015, but [facility] stated it had not been called in.  I reviewed pt's Medication Orders and the Lamisil rx had been received on 07/11/2015 at 0907am and confirmed receipt by the pharmacy.  I informed the pt, and asked if she wanted me to represcribe to the same [place] and she did.  represcribed to [place].

## 2015-08-13 DIAGNOSIS — Z79899 Other long term (current) drug therapy: Secondary | ICD-10-CM | POA: Diagnosis not present

## 2015-08-13 DIAGNOSIS — G35 Multiple sclerosis: Secondary | ICD-10-CM | POA: Diagnosis not present

## 2015-08-28 ENCOUNTER — Encounter: Payer: Self-pay | Admitting: Internal Medicine

## 2015-08-28 ENCOUNTER — Ambulatory Visit (INDEPENDENT_AMBULATORY_CARE_PROVIDER_SITE_OTHER): Payer: Medicare Other | Admitting: Internal Medicine

## 2015-08-28 ENCOUNTER — Ambulatory Visit: Payer: BC Managed Care – PPO | Admitting: Family Medicine

## 2015-08-28 VITALS — BP 172/90 | Temp 98.3°F | Wt 168.3 lb

## 2015-08-28 DIAGNOSIS — R030 Elevated blood-pressure reading, without diagnosis of hypertension: Secondary | ICD-10-CM

## 2015-08-28 DIAGNOSIS — T1490 Injury, unspecified: Secondary | ICD-10-CM

## 2015-08-28 DIAGNOSIS — S6992XA Unspecified injury of left wrist, hand and finger(s), initial encounter: Secondary | ICD-10-CM | POA: Diagnosis not present

## 2015-08-28 DIAGNOSIS — IMO0002 Reserved for concepts with insufficient information to code with codable children: Secondary | ICD-10-CM

## 2015-08-28 DIAGNOSIS — IMO0001 Reserved for inherently not codable concepts without codable children: Secondary | ICD-10-CM

## 2015-08-28 DIAGNOSIS — H43813 Vitreous degeneration, bilateral: Secondary | ICD-10-CM | POA: Diagnosis not present

## 2015-08-28 NOTE — Progress Notes (Signed)
Chief Complaint  Patient presents with  . Bent Pinky Druscilla Montgomery on July 12, 2015.  Unable to straighten left pinky finger.    HPI: Patient [name]  comes in today for SDA for  new problem evaluation. Ed visit though had x ray and told  neg fracture  Fall dec 15 th  But   Had swelling pain  And noted recently deformity and cannot extend finger nore full rom  .    ROS: See pertinent positives and negatives per HPI.  Past Medical History  Diagnosis Date  . MS (multiple sclerosis) ([place])   . Rhinitis   . Varicose veins   . Superficial phlebitis   . Mood disorder ([place])   . Depressive reaction   . Diverticulitis   . Diverticulosis   . Anemia   . Gastropathy 2013    reactive  . Frequent UTI LAST 7/13  . Blood in stool, frank 12/01/2011    Family History  Problem Relation Age of Onset  . Lung cancer Father   . Colon cancer Neg Hx     Social History   Social History  . Marital Status: Married    Spouse Name: N/A  . Number of Children: 4  . Years of Education: N/A   Occupational History  . Disability    Social History Main Topics  . Smoking status: Never Smoker   . Smokeless tobacco: Never Used  . Alcohol Use: 0.0 oz/week    0 Standard drinks or equivalent per week     Comment: occ  . Drug Use: No  . Sexual Activity: Not Asked   Other Topics Concern  . None   Social History Narrative   Unable to volunteer    Now on disability   Children now out of home   Daily caffeine     Outpatient Prescriptions Prior to Visit  Medication Sig Dispense Refill  . Alum Hydroxide-Mag Carbonate (GAVISCON PO) Take 1-3 tablets by mouth daily as needed (stomach acid).     . baclofen (LIORESAL) 10 MG tablet Take 5 mg by mouth daily.     Marland Kitchen buPROPion (WELLBUTRIN XL) 300 MG 24 hr tablet Take 300 mg by mouth daily.    . cephALEXin (KEFLEX) 250 MG capsule Take 250 mg by mouth daily.    . Cholecalciferol (VITAMIN D-3) 5000 UNITS TABS Take 1 tablet by mouth daily.     . clonazePAM (KLONOPIN) 1 MG tablet Take 2 mg by mouth at bedtime.    Marland Kitchen escitalopram (LEXAPRO) 10 MG tablet Take 10 mg by mouth daily.    Marland Kitchen esomeprazole (NEXIUM) 20 MG capsule Take 20-40 mg by mouth daily as needed (acid reflux).    Marland Kitchen ibuprofen (ADVIL,MOTRIN) 600 MG tablet Take 1 tablet (600 mg total) by mouth every 6 (six) hours as needed. 30 tablet 0  . Meth-Hyo-M Bl-Na Phos-Ph Sal (URIBEL) 118 MG CAPS Take 1 capsule by mouth daily as needed (bladder spasms). Using PRN    . natalizumab (TYSABRI) 300 MG/15ML injection Inject into the vein every 30 (thirty) days.     . Probiotic Product (PROBIOTIC PEARLS) CAPS Take 1 capsule by mouth daily.    Marland Kitchen terbinafine (LAMISIL) 250 MG tablet Take one tablet once daily x 7 days, then repeat every month x 4 months 28 tablet 0  . tiZANidine (ZANAFLEX) 4 MG tablet Take 4 mg by mouth at bedtime.    . ranitidine (ZANTAC) 300 MG tablet Take one po in  AM 30 tablet 0  . RaNITidine HCl (ZANTAC PO) Take 1 tablet by mouth daily as needed (acid reflux). Reported on 07/19/2015     No facility-administered medications prior to visit.     EXAM:  BP 172/90 mmHg  Temp(Src) 98.3 F (36.8 C) (Oral)  Wt 168 lb 4.8 oz (76.34 kg)  Body mass index is 28.01 kg/(m^2).  GENERAL: vitals reviewed and listed above, alert, oriented, appears well hydrated and in no acute distress MS: moves all extremities  Slow gait  Ambulatory  Left pinky finger with deformity swelling and brusieng  at dip joint cannot extend  Fully   No redness acute pain   PSYCH: pleasant and cooperative, no obvious depression or anxiety  ASSESSMENT AND PLAN:  Discussed the following assessment and plan:  Finger injury, left, initial encounter - 53 weeks old no x ray done deformity currently  Tendon injury ? - left pinky finger  Elevated BP - check when not in office to ensure nl   Says bp up from visit today eye doc and procedures to check usually ok  Will hold on x ray as will be referring for  eval rx anyway  To [facility]  Where she gets some pt for her balane -Patient advised to return or notify health care team  if symptoms worsen ,persist or new concerns arise. Overdue fore PV schedule for this today Patient Instructions  Don't  see x ray results of a finger  in the  System   Related to the December  15 fall . Will refer  You to hand docs at [facility] orthopedics   May have had  fracture  and or tendon injury .  Check blood pressure  When not in office to maker sure not still elevated .  Schedule  cpx and labs    For fu . Within 4 months      [name]. [name] M.D.

## 2015-08-28 NOTE — Patient Instructions (Addendum)
Don't  see x ray results of a finger  in the  System   Related to the December  15 fall . Will refer  You to hand docs at [facility] orthopedics   May have had  fracture  and or tendon injury .  Check blood pressure  When not in office to maker sure not still elevated .  Schedule  cpx and labs    For fu . Within 4 months

## 2015-08-29 DIAGNOSIS — S62657A Nondisplaced fracture of medial phalanx of left little finger, initial encounter for closed fracture: Secondary | ICD-10-CM | POA: Diagnosis not present

## 2015-09-08 ENCOUNTER — Encounter: Payer: Self-pay | Admitting: Internal Medicine

## 2015-09-08 ENCOUNTER — Ambulatory Visit (INDEPENDENT_AMBULATORY_CARE_PROVIDER_SITE_OTHER): Payer: Medicare Other | Admitting: Internal Medicine

## 2015-09-08 VITALS — BP 140/90 | HR 75 | Temp 98.3°F | Resp 20 | Ht 65.0 in | Wt 170.0 lb

## 2015-09-08 DIAGNOSIS — G35 Multiple sclerosis: Secondary | ICD-10-CM | POA: Diagnosis not present

## 2015-09-08 DIAGNOSIS — R0781 Pleurodynia: Secondary | ICD-10-CM

## 2015-09-08 DIAGNOSIS — S7001XD Contusion of right hip, subsequent encounter: Secondary | ICD-10-CM

## 2015-09-08 DIAGNOSIS — S7001XA Contusion of right hip, initial encounter: Secondary | ICD-10-CM | POA: Diagnosis not present

## 2015-09-08 DIAGNOSIS — W19XXXA Unspecified fall, initial encounter: Secondary | ICD-10-CM

## 2015-09-08 MED ORDER — OXYCODONE HCL 5 MG PO TABS: 5.0000 mg | ORAL_TABLET | ORAL | Status: DC | PRN

## 2015-09-08 NOTE — Patient Instructions (Addendum)
Pain med  As needed with itching precautions . Hold area and take deep breaths  occassionally  To avoid getting pna.   Could have a rib fracture .  Get x ray when open next week contact us if fever  Short of breath .  Fall prevention .   Rib Fracture A rib fracture is a break or crack in one of the bones of the ribs. The ribs are a group of long, curved bones that wrap around your chest and attach to your spine. They protect your lungs and other organs in the chest cavity. A broken or cracked rib is often painful, but most do not cause other problems. Most rib fractures heal on their own over time. However, rib fractures can be more serious if multiple ribs are broken or if broken ribs move out of place and push against other structures. CAUSES   A direct blow to the chest. For example, this could happen during contact sports, a car accident, or a fall against a hard object.  Repetitive movements with high force, such as pitching a baseball or having severe coughing spells. SYMPTOMS   Pain when you breathe in or cough.  Pain when someone presses on the injured area. DIAGNOSIS  Your caregiver will perform a physical exam. Various imaging tests may be ordered to confirm the diagnosis and to look for related injuries. These tests may include a chest X-ray, computed tomography (CT), magnetic resonance imaging (MRI), or a bone scan. TREATMENT  Rib fractures usually heal on their own in 1-3 months. The longer healing period is often associated with a continued cough or other aggravating activities. During the healing period, pain control is very important. Medication is usually given to control pain. Hospitalization or surgery may be needed for more severe injuries, such as those in which multiple ribs are broken or the ribs have moved out of place.  HOME CARE INSTRUCTIONS   Avoid strenuous activity and any activities or movements that cause pain. Be careful during activities and avoid bumping  the injured rib.  Gradually increase activity as directed by your caregiver.  Only take over-the-counter or prescription medications as directed by your caregiver. Do not take other medications without asking your caregiver first.  Apply ice to the injured area for the first 1-2 days after you have been treated or as directed by your caregiver. Applying ice helps to reduce inflammation and pain.  Put ice in a plastic bag.  Place a towel between your skin and the bag.   Leave the ice on for 15-20 minutes at a time, every 2 hours while you are awake.  Perform deep breathing as directed by your caregiver. This will help prevent pneumonia, which is a common complication of a broken rib. Your caregiver may instruct you to:  Take deep breaths several times a day.  Try to cough several times a day, holding a pillow against the injured area.  Use a device called an incentive spirometer to practice deep breathing several times a day.  Drink enough fluids to keep your urine clear or pale yellow. This will help you avoid constipation.   Do not wear a rib belt or binder. These restrict breathing, which can lead to pneumonia.  SEEK IMMEDIATE MEDICAL CARE IF:   You have a fever.   You have difficulty breathing or shortness of breath.   You develop a continual cough, or you cough up thick or bloody sputum.  You feel sick to your stomach (nausea),  throw up (vomit), or have abdominal pain.   You have worsening pain not controlled with medications.  MAKE SURE YOU:  Understand these instructions.  Will watch your condition.  Will get help right away if you are not doing well or get worse.   This information is not intended to replace advice given to you by your health care provider. Make sure you discuss any questions you have with your health care provider.   Document Released: 07/14/2005 Document Revised: 03/16/2013 Document Reviewed: 09/15/2012 Elsevier Interactive Patient  Education [employer].

## 2015-09-08 NOTE — Progress Notes (Signed)
Pre visit review using our clinic review tool, if applicable. No additional management support is needed unless otherwise documented below in the visit note.   Chief Complaint  Patient presents with  . Fall    Pt fell yesterday  . right rib pain    HPI: Patient [name]  comes in today for SDA for  new problem evaluation. 60 yo pt with MS  Ambulatory  Under care  Who  yesterday  [address]  Up steps  At kitchen fell into flower  Pot.  And right hip bruised and right rib cage  . Very tender at ribs and hurts to take a deep breath using ibu   Minimal  help  Very painful  No fever sob  Hip are not bothering her .  No event  Just tripped was supposed to be getting  Pt for balance and not happened yet.   Gets itching with narcotics but no rash edema   Is weaning the clonipen to tizanidine ROS: See pertinent positives and negatives per HPI.  Past Medical History  Diagnosis Date  . MS (multiple sclerosis) ([place])   . Rhinitis   . Varicose veins   . Superficial phlebitis   . Mood disorder ([place])   . Depressive reaction   . Diverticulitis   . Diverticulosis   . Anemia   . Gastropathy 2013    reactive  . Frequent UTI LAST 7/13  . Blood in stool, frank 12/01/2011    Family History  Problem Relation Age of Onset  . Lung cancer Father   . Colon cancer Neg Hx     Social History   Social History  . Marital Status: Married    Spouse Name: N/A  . Number of Children: 4  . Years of Education: N/A   Occupational History  . Disability    Social History Main Topics  . Smoking status: Never Smoker   . Smokeless tobacco: Never Used  . Alcohol Use: 0.0 oz/week    0 Standard drinks or equivalent per week     Comment: occ  . Drug Use: No  . Sexual Activity: Not Asked   Other Topics Concern  . None   Social History Narrative   Unable to volunteer    Now on disability   Children now out of home   Daily caffeine     Outpatient Prescriptions Prior to Visit  Medication Sig  Dispense Refill  . Alum Hydroxide-Mag Carbonate (GAVISCON PO) Take 1-3 tablets by mouth daily as needed (stomach acid).     . baclofen (LIORESAL) 10 MG tablet Take 5 mg by mouth daily.     Marland Kitchen buPROPion (WELLBUTRIN XL) 300 MG 24 hr tablet Take 300 mg by mouth daily.    . cephALEXin (KEFLEX) 250 MG capsule Take 250 mg by mouth daily.    . Cholecalciferol (VITAMIN D-3) 5000 UNITS TABS Take 1 tablet by mouth daily.    . clonazePAM (KLONOPIN) 1 MG tablet Take 2 mg by mouth at bedtime.    Marland Kitchen escitalopram (LEXAPRO) 10 MG tablet Take 10 mg by mouth daily.    Marland Kitchen esomeprazole (NEXIUM) 20 MG capsule Take 20-40 mg by mouth daily as needed (acid reflux).    Marland Kitchen ibuprofen (ADVIL,MOTRIN) 600 MG tablet Take 1 tablet (600 mg total) by mouth every 6 (six) hours as needed. 30 tablet 0  . Meth-Hyo-M Bl-Na Phos-Ph Sal (URIBEL) 118 MG CAPS Take 1 capsule by mouth daily as needed (bladder spasms). Using PRN    .  natalizumab (TYSABRI) 300 MG/15ML injection Inject into the vein every 30 (thirty) days.     . Probiotic Product (PROBIOTIC PEARLS) CAPS Take 1 capsule by mouth daily.    Marland Kitchen terbinafine (LAMISIL) 250 MG tablet Take one tablet once daily x 7 days, then repeat every month x 4 months 28 tablet 0  . tiZANidine (ZANAFLEX) 4 MG tablet Take 4 mg by mouth at bedtime.     No facility-administered medications prior to visit.     EXAM:  BP 140/90 mmHg  Pulse 75  Temp(Src) 98.3 F (36.8 C) (Oral)  Resp 20  Ht 5\' 5"  (1.651 m)  Wt 170 lb (77.111 kg)  BMI 28.29 kg/m2  SpO2 98%  Body mass index is 28.29 kg/(m^2).  GENERAL: vitals reviewed and listed above, alert, oriented, appears well hydrated and in no acute distress  Hurst right side and splints    HEENT: atraumatic, conjunctiva  clear, no obvious abnormalities on inspection of external nose and earsNECK: no obvious masses on inspection palpation  LUNGS: clear to auscultation bilaterally, no wheezes, rales or rhonchi, good air movement  Bruises contusion right hip  area  Without tenderness or dysfunction Right inferior lateral ribs t 10-12 very tender laterally  No crepitus or obv step off .   CV: HRRR, no clubbing cyanosis MS: moves all extremities gait steady  PSYCH: pleasant and cooperative, no obvious depression or anxiety  ASSESSMENT AND PLAN:  Discussed the following assessment and plan:  Rib pain on right side - Plan: DG Chest 2 View, DG Ribs Unilateral Right  Fall, initial encounter - Plan: DG Chest 2 View, DG Ribs Unilateral Right  Contusion, hip, right, subsequent encounter - soft tissue only  Multiple sclerosis (HCC) Get itching with narcotics but  Not allergic response otherwise and thinks this would be helpful . rx given with caution and avoid with benzos as much as possible .   Get x ray when open in week.  Poss fx    Expectant management.  Lungs ok to day no evidence of displaced fx of pulm compromise  To see her neuroi next week .  -Patient advised to return or notify health care team  if symptoms worsen ,persist or new concerns arise.  Patient Instructions  Pain med  As needed with itching precautions . Hold area and take deep breaths  occassionally  To avoid getting pna.   Could have a rib fracture .  Get x ray when open next week contact us if fever  Short of breath .  Fall prevention .   Rib Fracture A rib fracture is a break or crack in one of the bones of the ribs. The ribs are a group of long, curved bones that wrap around your chest and attach to your spine. They protect your lungs and other organs in the chest cavity. A broken or cracked rib is often painful, but most do not cause other problems. Most rib fractures heal on their own over time. However, rib fractures can be more serious if multiple ribs are broken or if broken ribs move out of place and push against other structures. CAUSES   A direct blow to the chest. For example, this could happen during contact sports, a car accident, or a fall against a hard  object.  Repetitive movements with high force, such as pitching a baseball or having severe coughing spells. SYMPTOMS   Pain when you breathe in or cough.  Pain when someone presses on the injured area.  DIAGNOSIS  Your caregiver will perform a physical exam. Various imaging tests may be ordered to confirm the diagnosis and to look for related injuries. These tests may include a chest X-ray, computed tomography (CT), magnetic resonance imaging (MRI), or a bone scan. TREATMENT  Rib fractures usually heal on their own in 1-3 months. The longer healing period is often associated with a continued cough or other aggravating activities. During the healing period, pain control is very important. Medication is usually given to control pain. Hospitalization or surgery may be needed for more severe injuries, such as those in which multiple ribs are broken or the ribs have moved out of place.  HOME CARE INSTRUCTIONS   Avoid strenuous activity and any activities or movements that cause pain. Be careful during activities and avoid bumping the injured rib.  Gradually increase activity as directed by your caregiver.  Only take over-the-counter or prescription medications as directed by your caregiver. Do not take other medications without asking your caregiver first.  Apply ice to the injured area for the first 1-2 days after you have been treated or as directed by your caregiver. Applying ice helps to reduce inflammation and pain.  Put ice in a plastic bag.  Place a towel between your skin and the bag.   Leave the ice on for 15-20 minutes at a time, every 2 hours while you are awake.  Perform deep breathing as directed by your caregiver. This will help prevent pneumonia, which is a common complication of a broken rib. Your caregiver may instruct you to:  Take deep breaths several times a day.  Try to cough several times a day, holding a pillow against the injured area.  Use a device called an  incentive spirometer to practice deep breathing several times a day.  Drink enough fluids to keep your urine clear or pale yellow. This will help you avoid constipation.   Do not wear a rib belt or binder. These restrict breathing, which can lead to pneumonia.  SEEK IMMEDIATE MEDICAL CARE IF:   You have a fever.   You have difficulty breathing or shortness of breath.   You develop a continual cough, or you cough up thick or bloody sputum.  You feel sick to your stomach (nausea), throw up (vomit), or have abdominal pain.   You have worsening pain not controlled with medications.  MAKE SURE YOU:  Understand these instructions.  Will watch your condition.  Will get help right away if you are not doing well or get worse.   This information is not intended to replace advice given to you by your health care provider. Make sure you discuss any questions you have with your health care provider.   Document Released: 07/14/2005 Document Revised: 03/16/2013 Document Reviewed: 09/15/2012 Elsevier Interactive Patient Education 2016 [place] K. Ashaunte Standley M.D.

## 2015-09-17 DIAGNOSIS — Z79899 Other long term (current) drug therapy: Secondary | ICD-10-CM | POA: Diagnosis not present

## 2015-09-17 DIAGNOSIS — G35 Multiple sclerosis: Secondary | ICD-10-CM | POA: Diagnosis not present

## 2015-09-17 DIAGNOSIS — R2689 Other abnormalities of gait and mobility: Secondary | ICD-10-CM | POA: Diagnosis not present

## 2015-10-15 DIAGNOSIS — G35 Multiple sclerosis: Secondary | ICD-10-CM | POA: Diagnosis not present

## 2015-11-12 DIAGNOSIS — R2689 Other abnormalities of gait and mobility: Secondary | ICD-10-CM | POA: Diagnosis not present

## 2015-11-12 DIAGNOSIS — Z79899 Other long term (current) drug therapy: Secondary | ICD-10-CM | POA: Diagnosis not present

## 2015-11-12 DIAGNOSIS — G35 Multiple sclerosis: Secondary | ICD-10-CM | POA: Diagnosis not present

## 2015-11-19 DIAGNOSIS — S62657D Nondisplaced fracture of medial phalanx of left little finger, subsequent encounter for fracture with routine healing: Secondary | ICD-10-CM | POA: Diagnosis not present

## 2015-12-03 ENCOUNTER — Encounter: Payer: Self-pay | Admitting: Podiatry

## 2015-12-03 ENCOUNTER — Ambulatory Visit (INDEPENDENT_AMBULATORY_CARE_PROVIDER_SITE_OTHER): Payer: Medicare Other | Admitting: Podiatry

## 2015-12-03 VITALS — BP 143/80 | HR 74 | Resp 16

## 2015-12-03 DIAGNOSIS — S62657D Nondisplaced fracture of medial phalanx of left little finger, subsequent encounter for fracture with routine healing: Secondary | ICD-10-CM | POA: Diagnosis not present

## 2015-12-03 DIAGNOSIS — B351 Tinea unguium: Secondary | ICD-10-CM | POA: Diagnosis not present

## 2015-12-03 MED ORDER — TERBINAFINE HCL 250 MG PO TABS: ORAL_TABLET | ORAL | Status: DC

## 2015-12-04 ENCOUNTER — Ambulatory Visit: Payer: Medicare Other | Attending: Neurology

## 2015-12-04 DIAGNOSIS — R279 Unspecified lack of coordination: Secondary | ICD-10-CM | POA: Insufficient documentation

## 2015-12-04 DIAGNOSIS — M6281 Muscle weakness (generalized): Secondary | ICD-10-CM | POA: Diagnosis not present

## 2015-12-04 DIAGNOSIS — R2689 Other abnormalities of gait and mobility: Secondary | ICD-10-CM | POA: Insufficient documentation

## 2015-12-04 NOTE — Progress Notes (Signed)
Subjective:     Patient ID: [name], female   DOB: March 04, 1956, 60 y.o.   MRN: [id number]  HPI patient's had some improvement in her nailbed is concerned that there is been some reoccurrence   Review of Systems     Objective:   Physical Exam Neurovascular status intact muscle strength adequate with discoloration in the right hallux nail and the fourth and fifth left fifth right localized in nature    Assessment:     Probable reoccurrence of mild mycotic infection still improved from previous    Plan:     Went ahead today and scheduled for 2 laser is and also we will start her on a 4 month Lamisil pills pack

## 2015-12-04 NOTE — Therapy (Signed)
[facility] [address] [place], [name], [address] Phone: [phone]   Fax:  [phone]  Physical Therapy Evaluation  Patient Details  Name: [name] MRN: [id number] Date of Birth: [birth year] Referring Provider: Dr. Everette Rank  Encounter Date: 12/04/2015      PT End of Session - 12/04/15 1327    Visit Number 1   Number of Visits 9   Date for PT Re-Evaluation 01/03/16   Authorization Type [employer] Medicare: G-code every 10th visit.   PT Start Time 1230   PT Stop Time 1314   PT Time Calculation (min) 44 min   Equipment Utilized During Treatment Gait belt   Activity Tolerance Patient tolerated treatment well   Behavior During Therapy WFL for tasks assessed/performed      Past Medical History  Diagnosis Date  . MS (multiple sclerosis) ([place])   . Rhinitis   . Varicose veins   . Superficial phlebitis   . Mood disorder ([place])   . Depressive reaction   . Diverticulitis   . Diverticulosis   . Anemia   . Gastropathy 2013    reactive  . Frequent UTI LAST 7/13  . Blood in stool, frank 12/01/2011    Past Surgical History  Procedure Laterality Date  . Tonsillectomy and adenoidectomy    . Bladder surgery  2008    Tack  . Abdominal hysterectomy  2008  . Shoulder arthroscopy with rotator cuff repair and subacromial decompression  06/01/2012    Procedure: SHOULDER ARTHROSCOPY WITH ROTATOR CUFF REPAIR AND SUBACROMIAL DECOMPRESSION;  Surgeon: Magnus Sinning, MD;  Location: [facility];  Service: Orthopedics;  Laterality: Left;  LEFT SHOULDER ARTHROSCOPY WITH SUBACROMIAL DECOMPRESION AND DEBRIDEMENT OF PARTIAL ROTATOR CUFF TEAR  . Abdominal hysterectomy      There were no vitals filed for this visit.       Subjective Assessment - 12/04/15 1240    Subjective Pt with hx of secondary progressive MS (dx: 2008). Pt reported she has fallen five times in the last 6 months (with two big falls in 06/2015,  08/2015), she has an appt. with orthopedist for L 5th digit injury after last fall. Pt's balance has been progressively worse over the few months, she especially has issues walking over paved/uneven surfaces. Pt occasionally feels off balance when standing still. Pt reports her L side is weaker but R foot drags. She used to go to an adaptive swim program and it helped balance. Pt reports she also experiences bladder spasms and takes Uribel for spasms (pt uncertain of dosage).    Pertinent History HTN (per chart), MS, depression, dizziness   Patient Stated Goals Making it more natural to pick up my feet, improve balance    Currently in Pain? Yes   Pain Score --  5-6/10   Pain Location Shoulder   Pain Orientation Left   Pain Descriptors / Indicators Aching   Pain Type Acute pain   Pain Onset Yesterday   Pain Frequency Intermittent   Aggravating Factors  MRI positioning   Pain Relieving Factors rest            [facility] PT Assessment - 12/04/15 1246    Assessment   Medical Diagnosis MS and imbalance    Referring Provider Dr. Everette Rank   Onset Date/Surgical Date 62/01/17   Hand Dominance Right   Prior Therapy none for OPPT neuro   Precautions   Precautions Fall   Restrictions   Weight Bearing Restrictions No  Balance Screen   Has the patient fallen in the past 6 months Yes   How many times? 5   Has the patient had a decrease in activity level because of a fear of falling?  Yes   Is the patient reluctant to leave their home because of a fear of falling?  Yes   [place] Private residence   Living Arrangements Spouse/significant other   Available Help at Discharge Family   Type of [place] to enter   Entrance Stairs-Number of Steps 4   Entrance Stairs-Rails Can reach both;Right   [place] One level   [place] - 4 wheels;Cane - single point   Prior Function   Level of Independence Requires assistive device for  independence   Vocation Retired   Leisure Spend time grandchildren, needlepoint, read, watch old movies, quilt   Cognition   Overall Cognitive Status Impaired/Different from baseline   Problem Solving Impaired   Problem Solving Impairment --  pt reports "difficulty connecting the dots"   Sensation   Light Touch Impaired by gross assessment  Decr. R LE light touch and R hand    Additional Comments Pt reported intermittent N/T.   Coordination   Gross Motor Movements are Fluid and Coordinated Yes   Fine Motor Movements are Fluid and Coordinated Yes   Finger Nose Finger Test Good speed but did exeperience intermittent past pointing   Tone   Assessment Location Right Lower Extremity;Left Lower Extremity   ROM / Strength   AROM / PROM / Strength AROM;Strength   AROM   Overall AROM  Within functional limits for tasks performed   Overall AROM Comments B UE/LE AROM WFL   Strength   Overall Strength Deficits   Overall Strength Comments B UE WFL. B LE 4/5 except for hip abd, knee flex, hip flexion 3+-4-/5.   Transfers   Transfers Stand to Sit;Sit to Stand   Sit to Stand 5: Supervision;With upper extremity assist;From chair/3-in-1   Sit to Stand Details (indicate cue type and reason) Pt did require chair support against B LEs during sit to stand after performing TUG test 2/2 fatigue and decr. ant. wt. shifting.   Stand to Sit 5: Supervision;With upper extremity assist;With armrests   Ambulation/Gait   Ambulation/Gait Yes   Ambulation/Gait Assistance 5: Supervision;4: Min guard;4: Min assist   Ambulation/Gait Assistance Details Min guard during turns without AD.   Ambulation Distance (Feet) 75 Feet   Assistive device Rollator;None   Gait Pattern Step-to pattern;Decreased stride length;Decreased hip/knee flexion - right;Narrow base of support;Decreased dorsiflexion - right;Decreased dorsiflexion - left  Decr. R DF decr. > L DF   Ambulation Surface Level;Indoor   Gait velocity 2.14ft/sec.   with rollator and 3.60ft/sec. without rollator with LOB   Balance   Balance Assessed Yes   Static Standing Balance   Static Standing - Balance Support No upper extremity supported   Static Standing - Level of Assistance 4: Min assist;Other (comment)  min guard   Static Standing Balance -  Activities  Single Leg Stance - Right Leg;Single Leg Stance - Left Leg   Static Standing - Comment/# of Minutes 3sec. during R SLS prior to requiring min A to maintain balance and 2sec. during L SLS.   Standardized Balance Assessment   Standardized Balance Assessment Timed Up and Go Test   Timed Up and Go Test   TUG Normal TUG   Normal TUG (seconds) 13.3  no AD with min guard   RLE Tone   RLE Tone Modified Ashworth   RLE Tone   Modified Ashworth Scale for Grading Hypertonia RLE Slight increase in muscle tone, manifested by a catch and release or by minimal resistance at the end of the range of motion when the affected part(s) is moved in flexion or extension   LLE Tone   LLE Tone Within Functional Limits                           PT Education - 12/04/15 1327    Education provided Yes   Education Details PT discussed frequency/duration and outcome measure results.    Person(s) Educated Patient   Methods Explanation   Comprehension Verbalized understanding          PT Short Term Goals - 12/04/15 1333    PT SHORT TERM GOAL #1   Title same as LTGs           PT Long Term Goals - 12/04/15 1333    PT LONG TERM GOAL #1   Title Pt will verbalize fall prevention strategies to decr. falls risk. Target date: 01/01/16   Status New   PT LONG TERM GOAL #2   Title Pt will verbalize energy conservation strategies to improve endurance and decr. falls risk. Target date: 01/01/16   Status New   PT LONG TERM GOAL #3   Title Perform DGI and write goal. Target date: 01/01/16   Status New   PT LONG TERM GOAL #4   Title Pt will amb. 600' over even/uneven surfaces with LRAD at MOD I level  to improve functional mobility. Target date: 01/01/16   Status New               Plan - 12/04/15 1327    Clinical Impression Statement Pt is a pleasant 60y/o female presenting to OPPT neuro with hx of secondary progressive MS and imbalance. Pt's exam findings include the following deficits: gait devaitions, impaired balance, decr. coordination, decreased endurance, impaired strength, and increased tone. Pt required min A to maintain balance during gait without rollator and difficulty clearing B toes during gait incr. after amb. >50'. Pt's gait speed and TUG time WNL. PT will formally assess balance with DGI next session, as pt fatigue at end of session.   Rehab Potential Good   Clinical Impairments Affecting Rehab Potential co-morbidities (secondary progressive MS)   PT Frequency 2x / week   PT Duration 4 weeks   PT Treatment/Interventions ADLs/Self Care Home Management;Biofeedback;Canalith Repostioning;Electrical Stimulation;Therapeutic exercise;Balance training;Manual techniques;Vestibular;Therapeutic activities;Orthotic Fit/Training;Functional mobility training;Stair training;Gait training;Patient/family education;DME Instruction;Neuromuscular re-education   PT Next Visit Plan Perform DGI and write goal, initiate HEP. Provide energy conservation/MS society information and fall prevention handout   Consulted and Agree with Plan of Care Patient      Patient will benefit from skilled therapeutic intervention in order to improve the following deficits and impairments:  Pain, Decreased coordination, Decreased balance, Decreased mobility, Impaired tone, Decreased knowledge of use of DME, Decreased strength, Abnormal gait, Decreased endurance (PT will not directly address pain but will monitor closely.)  Visit Diagnosis: Other abnormalities of gait and mobility - Plan: PT plan of care cert/re-cert  Muscle weakness (generalized) - Plan: PT plan of care cert/re-cert  Unspecified lack of  coordination - Plan: PT plan of care cert/re-cert      G-Codes - 123XX123 1337    Functional Assessment Tool Used R SLS: 3 sec.  with min guard to min; L SLS: 2sec. with min guard to min A; gait speed with rollator: 2.61ft/sec.; TUG without AD (with min guard): 13.3sec.   Functional Limitation Mobility: Walking and moving around   Mobility: Walking and Moving Around Current Status [phone]) At least 20 percent but less than 40 percent impaired, limited or restricted   Mobility: Walking and Moving Around Goal Status [phone]) At least 1 percent but less than 20 percent impaired, limited or restricted       Problem List Patient Active Problem List   Diagnosis Date Noted  . Nausea alone 02/13/2014  . Other malaise and fatigue 02/13/2014  . Postmenopausal estrogen deficiency 02/24/2013  . Visit for preventive health examination 02/24/2013  . Family hx osteoporosis 02/21/2013  . Recurrent UTI 01/09/2012  . Suprapubic tenderness 12/01/2011  . GERD (gastroesophageal reflux disease) 10/15/2011  . Dysphagia, pharyngoesophageal 10/15/2011  . Voiding dysfunction 09/24/2011  . Hypertriglyceridemia 02/09/2011  . VITAMIN D DEFICIENCY 12/13/2008  . CONSTIPATION 12/13/2008  . MUSCLE SPASM 12/13/2008  . DIZZINESS 12/13/2008  . HYPOKALEMIA, HX OF 12/13/2008  . DISORDER, ADJUSTMENT W/DEPRESSED MOOD 04/30/2007  . SCLEROSIS, MULTIPLE 04/30/2007  . HYPERTENSION 04/30/2007  . VARICOSE VEIN, LWR EXTREMITIES W/INFLAMMATION 04/30/2007  . VASOMOTOR RHINITIS 04/30/2007    Milus Fritze L 12/04/2015, 1:39 PM  [facility] [address] [place], [name], [address] Phone: [phone]   Fax:  [phone]  Name: ANGLIE SANDLER MRN: [id number] Date of Birth: Mar 31, 1956   [name], PT,DPT 12/04/2015 1:39 PM Phone: [phone] Fax: [phone]

## 2015-12-06 ENCOUNTER — Telehealth: Payer: Self-pay | Admitting: *Deleted

## 2015-12-06 NOTE — Telephone Encounter (Signed)
Pt states the Terbinafine rx is not at the [facility].  12/06/2015-I informed pt the rx had been e-scribed to [name] on [address] at 335pm on 12/03/2015, and I spoke with Remo Lipps [phone] and she states it is there and they will prepare for pt pick up.

## 2015-12-10 DIAGNOSIS — S62657D Nondisplaced fracture of medial phalanx of left little finger, subsequent encounter for fracture with routine healing: Secondary | ICD-10-CM | POA: Diagnosis not present

## 2015-12-10 DIAGNOSIS — M79642 Pain in left hand: Secondary | ICD-10-CM | POA: Diagnosis not present

## 2015-12-12 ENCOUNTER — Ambulatory Visit (INDEPENDENT_AMBULATORY_CARE_PROVIDER_SITE_OTHER): Payer: Medicare Other

## 2015-12-12 DIAGNOSIS — Z79899 Other long term (current) drug therapy: Secondary | ICD-10-CM | POA: Diagnosis not present

## 2015-12-12 DIAGNOSIS — B351 Tinea unguium: Secondary | ICD-10-CM

## 2015-12-12 DIAGNOSIS — G43009 Migraine without aura, not intractable, without status migrainosus: Secondary | ICD-10-CM | POA: Diagnosis not present

## 2015-12-12 DIAGNOSIS — G35 Multiple sclerosis: Secondary | ICD-10-CM | POA: Diagnosis not present

## 2015-12-12 NOTE — Progress Notes (Signed)
   Subjective:    Patient ID: [name], female    DOB: Aug 11, 1955, 60 y.o.   MRN: [id number]  HPI Pt presents with discoloration and slight thickness of nails   Review of Systems    All other systems negative Objective:   Physical Exam   Mycotic nails     Assessment & Plan:  Laser therapy administered to nails 1-5 bilateral.  1000 pulses, all safety precautions in place. Nails debrided prior to procedure. Re-appointed in 1 month for 2nd laser treatment

## 2015-12-13 DIAGNOSIS — M79642 Pain in left hand: Secondary | ICD-10-CM | POA: Diagnosis not present

## 2015-12-20 DIAGNOSIS — M79642 Pain in left hand: Secondary | ICD-10-CM | POA: Diagnosis not present

## 2015-12-21 ENCOUNTER — Ambulatory Visit: Payer: Medicare Other

## 2015-12-21 DIAGNOSIS — R2689 Other abnormalities of gait and mobility: Secondary | ICD-10-CM | POA: Diagnosis not present

## 2015-12-21 DIAGNOSIS — M6281 Muscle weakness (generalized): Secondary | ICD-10-CM

## 2015-12-21 DIAGNOSIS — R279 Unspecified lack of coordination: Secondary | ICD-10-CM | POA: Diagnosis not present

## 2015-12-21 NOTE — Patient Instructions (Addendum)
Fall Prevention in the Home  Falls can cause injuries and can affect people from all age groups. There are many simple things that you can do to make your home safe and to help prevent falls. WHAT CAN I DO ON THE OUTSIDE OF MY HOME?  Regularly repair the edges of walkways and driveways and fix any cracks.  Remove high doorway thresholds.  Trim any shrubbery on the main path into your home.  Use bright outdoor lighting.  Clear walkways of debris and clutter, including tools and rocks.  Regularly check that handrails are securely fastened and in good repair. Both sides of any steps should have handrails.  Install guardrails along the edges of any raised decks or porches.  Have leaves, snow, and ice cleared regularly.  Use sand or salt on walkways during winter months.  In the garage, clean up any spills right away, including grease or oil spills. WHAT CAN I DO IN THE BATHROOM?  Use night lights.  Install grab bars by the toilet and in the tub and shower. Do not use towel bars as grab bars.  Use non-skid mats or decals on the floor of the tub or shower.  If you need to sit down while you are in the shower, use a plastic, non-slip stool..  Keep the floor dry. Immediately clean up any water that spills on the floor.  Remove soap buildup in the tub or shower on a regular basis.  Attach bath mats securely with double-sided non-slip rug tape.  Remove throw rugs and other tripping hazards from the floor. WHAT CAN I DO IN THE BEDROOM?  Use night lights.  Make sure that a bedside light is easy to reach.  Do not use oversized bedding that drapes onto the floor.  Have a firm chair that has side arms to use for getting dressed.  Remove throw rugs and other tripping hazards from the floor. WHAT CAN I DO IN THE KITCHEN?   Clean up any spills right away.  Avoid walking on wet floors.  Place frequently used items in easy-to-reach places.  If you need to reach for something  above you, use a sturdy step stool that has a grab bar.  Keep electrical cables out of the way.  Do not use floor polish or wax that makes floors slippery. If you have to use wax, make sure that it is non-skid floor wax.  Remove throw rugs and other tripping hazards from the floor. WHAT CAN I DO IN THE STAIRWAYS?  Do not leave any items on the stairs.  Make sure that there are handrails on both sides of the stairs. Fix handrails that are broken or loose. Make sure that handrails are as long as the stairways.  Check any carpeting to make sure that it is firmly attached to the stairs. Fix any carpet that is loose or worn.  Avoid having throw rugs at the top or bottom of stairways, or secure the rugs with carpet tape to prevent them from moving.  Make sure that you have a light switch at the top of the stairs and the bottom of the stairs. If you do not have them, have them installed. WHAT ARE SOME OTHER FALL PREVENTION TIPS?  Wear closed-toe shoes that fit well and support your feet. Wear shoes that have rubber soles or low heels.  When you use a stepladder, make sure that it is completely opened and that the sides are firmly locked. Have someone hold the ladder while you   are using it. Do not climb a closed stepladder.  Add color or contrast paint or tape to grab bars and handrails in your home. Place contrasting color strips on the first and last steps.  Use mobility aids as needed, such as canes, walkers, scooters, and crutches.  Turn on lights if it is dark. Replace any light bulbs that burn out.  Set up furniture so that there are clear paths. Keep the furniture in the same spot.  Fix any uneven floor surfaces.  Choose a carpet design that does not hide the edge of steps of a stairway.  Be aware of any and all pets.  Review your medicines with your healthcare provider. Some medicines can cause dizziness or changes in blood pressure, which increase your risk of falling. Talk  with your health care provider about other ways that you can decrease your risk of falls. This may include working with a physical therapist or trainer to improve your strength, balance, and endurance.   This information is not intended to replace advice given to you by your health care provider. Make sure you discuss any questions you have with your health care provider.   Document Released: 07/04/2002 Document Revised: 11/28/2014 Document Reviewed: 08/18/2014 Elsevier Interactive Patient Education 2016 [employer].  Perform at [employer] for safety:   Side to Side Head Motion    Perform without assistive device. Walking on solid surface, turn head and eyes to left for __2__ steps. Then, turn head and eyes to opposite side for __2__ steps. Repeat sequence _4___ times per session. Do __1__ sessions per day.   Copyright  VHI. All rights reserved.  Up / Down Head Motion    Perform without assistive device. Walking on solid surface, move head and eyes toward ceiling for __2__ steps. Then, move head and eyes toward floor for __2__ steps. Repeat __4__ times per session. Do __1__ sessions per day.  Copyright  VHI. All rights reserved.  Backward    Walk backwards with eyes open. Take 10 even steps, making sure each foot lifts off floor. Repeat for __4__ times per session. Do __1__ sessions per day.   Copyright  VHI. All rights reserved.  Single Leg - Eyes Open    Holding support, lift right leg while maintaining balance over other leg. Progress to removing hands from support surface for longer periods of time. Perform lifting leg as well. Hold__10__ seconds. Repeat __3__ times per session with each leg. Do __1__ sessions per day.  Copyright  VHI. All rights reserved.

## 2015-12-21 NOTE — Therapy (Signed)
[facility] [address] [place], [name], [address] Phone: [phone]   Fax:  [phone]  Physical Therapy Treatment  Patient Details  Name: [name] MRN: [id number] Date of Birth: 02/20/1956 Referring Provider: Dr. Everette Rank  Encounter Date: 12/21/2015      PT End of Session - 12/21/15 1344    Visit Number 2   Number of Visits 9   Date for PT Re-Evaluation 01/03/16   Authorization Type UHC Medicare: G-code every 10th visit.   PT Start Time 0848   PT Stop Time 0929   PT Time Calculation (min) 41 min   Equipment Utilized During Treatment Gait belt   Activity Tolerance Patient tolerated treatment well   Behavior During Therapy WFL for tasks assessed/performed      Past Medical History  Diagnosis Date  . MS (multiple sclerosis) ([place])   . Rhinitis   . Varicose veins   . Superficial phlebitis   . Mood disorder ([place])   . Depressive reaction   . Diverticulitis   . Diverticulosis   . Anemia   . Gastropathy 2013    reactive  . Frequent UTI LAST 7/13  . Blood in stool, frank 12/01/2011    Past Surgical History  Procedure Laterality Date  . Tonsillectomy and adenoidectomy    . Bladder surgery  2008    Tack  . Abdominal hysterectomy  2008  . Shoulder arthroscopy with rotator cuff repair and subacromial decompression  06/01/2012    Procedure: SHOULDER ARTHROSCOPY WITH ROTATOR CUFF REPAIR AND SUBACROMIAL DECOMPRESSION;  Surgeon: Magnus Sinning, MD;  Location: [facility];  Service: Orthopedics;  Laterality: Left;  LEFT SHOULDER ARTHROSCOPY WITH SUBACROMIAL DECOMPRESION AND DEBRIDEMENT OF PARTIAL ROTATOR CUFF TEAR  . Abdominal hysterectomy      There were no vitals filed for this visit.      Subjective Assessment - 12/21/15 0851    Subjective Pt denied falls since last visit. Pt reported intermittent L foot pain (dorsal surface) began about a week ago, and denied injury to foot. Pt  reports pain incr. during amb.   Pertinent History HTN (per chart), MS, depression, dizziness   Patient Stated Goals Making it more natural to pick up my feet, improve balance    Currently in Pain? No/denies       Neuro re-ed: Pt performed balance HEP at counter with 1 UE support and min guard to S for safety. Cues and demonstration for technique, please see pt instructions for details.                  [facility] Adult PT Treatment/Exercise - 12/21/15 [id number]    Ambulation/Gait   Ambulation/Gait Yes   Ambulation/Gait Assistance 5: Supervision;4: Min guard   Ambulation/Gait Assistance Details S with rollator and min guard without AD due to intermittent difficulty clearing R toes.   Ambulation Distance (Feet) 400 Feet  outdoors and 75', 50'x2 indoors   Assistive device Rollator;None   Gait Pattern Step-to pattern;Decreased stride length;Decreased hip/knee flexion - right;Narrow base of support;Decreased dorsiflexion - right;Decreased dorsiflexion - left   Ambulation Surface Level;Unlevel;Indoor;Outdoor;Paved   Standardized Balance Assessment   Standardized Balance Assessment Dynamic Gait Index   Dynamic Gait Index   Level Surface Mild Impairment   Change in Gait Speed Mild Impairment   Gait with Horizontal Head Turns Mild Impairment   Gait with Vertical Head Turns Mild Impairment   Gait and Pivot Turn Mild Impairment   Step Over Obstacle Mild Impairment  Step Around Obstacles Normal   Steps Mild Impairment   Total Score 17                PT Education - 12/21/15 1343    Education provided Yes   Education Details PT discussed DGI results, provided falls education handout and reviewed new balance HEP.   Person(s) Educated Patient   Methods Explanation;Demonstration;Verbal cues;Handout   Comprehension Returned demonstration;Verbalized understanding          PT Short Term Goals - 12/04/15 1333    PT SHORT TERM GOAL #1   Title same as LTGs           PT  Long Term Goals - 12/21/15 1345    PT LONG TERM GOAL #1   Title Pt will verbalize fall prevention strategies to decr. falls risk. Target date: 01/01/16   Status On-going   PT LONG TERM GOAL #2   Title Pt will verbalize energy conservation strategies to improve endurance and decr. falls risk. Target date: 01/01/16   Status On-going   PT LONG TERM GOAL #3   Title Pt will improve DGI score to >/=20/24 to decrease falls risk. Target date: 01/01/16   Baseline 17/24 on 12/21/15   Status Revised   PT LONG TERM GOAL #4   Title Pt will amb. 600' over even/uneven surfaces with LRAD at MOD I level to improve functional mobility. Target date: 01/01/16   Status On-going               Plan - 12/21/15 1344    Clinical Impression Statement Pt's DGI score indicates pt is at risk for falls. Pt tolerated new balance HEP well, as she did not require rest breaks and did not experience excessive fatigue. Pt's gait deviations are incr. during amb. without rollator, but pt able to improve deviations after cues. Continue with POC.    Rehab Potential Good   Clinical Impairments Affecting Rehab Potential co-morbidities (secondary progressive MS)   PT Frequency 2x / week   PT Duration 4 weeks   PT Treatment/Interventions ADLs/Self Care Home Management;Biofeedback;Canalith Repostioning;Electrical Stimulation;Therapeutic exercise;Balance training;Manual techniques;Vestibular;Therapeutic activities;Orthotic Fit/Training;Functional mobility training;Stair training;Gait training;Patient/family education;DME Instruction;Neuromuscular re-education   PT Next Visit Plan Provide energy conservation handout. Initiate B LE strengthening HEP. Gait training with cane.   Consulted and Agree with Plan of Care Patient      Patient will benefit from skilled therapeutic intervention in order to improve the following deficits and impairments:  Pain, Decreased coordination, Decreased balance, Decreased mobility, Impaired tone, Decreased  knowledge of use of DME, Decreased strength, Abnormal gait, Decreased endurance (PT will not directly address pain but will monitor closely.)  Visit Diagnosis: Other abnormalities of gait and mobility  Muscle weakness (generalized)  Unspecified lack of coordination     Problem List Patient Active Problem List   Diagnosis Date Noted  . Nausea alone 02/13/2014  . Other malaise and fatigue 02/13/2014  . Postmenopausal estrogen deficiency 02/24/2013  . Visit for preventive health examination 02/24/2013  . Family hx osteoporosis 02/21/2013  . Recurrent UTI 01/09/2012  . Suprapubic tenderness 12/01/2011  . GERD (gastroesophageal reflux disease) 10/15/2011  . Dysphagia, pharyngoesophageal 10/15/2011  . Voiding dysfunction 09/24/2011  . Hypertriglyceridemia 02/09/2011  . VITAMIN D DEFICIENCY 12/13/2008  . CONSTIPATION 12/13/2008  . MUSCLE SPASM 12/13/2008  . DIZZINESS 12/13/2008  . HYPOKALEMIA, HX OF 12/13/2008  . DISORDER, ADJUSTMENT W/DEPRESSED MOOD 04/30/2007  . SCLEROSIS, MULTIPLE 04/30/2007  . HYPERTENSION 04/30/2007  . VARICOSE VEIN, LWR EXTREMITIES W/INFLAMMATION 04/30/2007  .  VASOMOTOR RHINITIS 04/30/2007    Miller,Jennifer L 12/21/2015, 1:46 PM  [facility] [address] [place], [name], [address] Phone: [phone]   Fax:  [phone]  Name: DAKIRA BABAYEV MRN: [id number] Date of Birth: 08/28/1955    [name], PT,DPT 12/21/2015 1:47 PM Phone: [phone] Fax: [phone]

## 2015-12-26 ENCOUNTER — Ambulatory Visit: Payer: Medicare Other

## 2015-12-26 DIAGNOSIS — M6281 Muscle weakness (generalized): Secondary | ICD-10-CM

## 2015-12-26 DIAGNOSIS — R279 Unspecified lack of coordination: Secondary | ICD-10-CM | POA: Diagnosis not present

## 2015-12-26 DIAGNOSIS — R2689 Other abnormalities of gait and mobility: Secondary | ICD-10-CM

## 2015-12-26 NOTE — Patient Instructions (Signed)
Energy Conservation Techniques  1. Sit for as many activities as possible. 2. Use slow, smooth movements.  Rushing increases discomfort. 3. Determine the necessity of performing the task.  Simplify those tasks that are necessary.  (Get clothes out of the dryer when they are warm instead of ironing, let dishes air dry, etc.) 4. Take frequent rests both during and between activities.  Avoid repetitive tasks. 5. Pre-plan your activities; try a daily and/or weekly schedule.  Spread out the activities that are most fatiguing (break up cleaning tasks over multiple days). 6. Remember to plan a balance of work, rest and recreation. 7. Consider the best time for each activity.  Do the most exertive task when you have the most energy. 8. Don't carry items if you can push them.  Slide, don't lift. Push, don't pull. 9. Utilize two hands when appropriate. 10. Maintain good posture and use proper body mechanics.  Avoid remaining in one position for too long.  When lifting, bend at the knees, not at the waist.  Exhale when bending down, inhale when straightening up.  Carry objects as close to your body and as near to the center of the pelvis.  11. Avoid wasted body movements (position yourself for the task so that you avoid bending, twisting, etc. when possible). 12. Select the best working environment.  Consider lighting, ventilation, clothing, and equipment. 45. Organize your storage areas, making the items you use daily convenient.  Store heaviest items at waist height.  Store frequently used items between shoulders and knee height.  Consider leaving frequently used  items on countertops.  (You can organize in storage baskets based on time used/purpose). 14. Feelings and emotions can be real causes of fatigue.  Try to avoid unnecessary worry, irritation, or frustration.  Avoid stress, it can also be a source of fatigue. 15. Get help from other people for difficult tasks. 16. Explore equipment or items  that may be able to do the job for you with greater ease.  (Electric can openers, blenders, lightweight items for cleaning, etc.)  Wall-Sit    With back pressed against a wall, bend knees and slide buttocks down until knees are about 45 or more. Stay in this position for _5__ seconds. Squeeze buttocks to stand upright. Repeat _10__ times. Do _3__ times per week.  Copyright  VHI. All rights reserved.   Side Stepping With Semi-Squat    Tie yellow band above knees. Hold onto counter and perform mini squats and side step to the right.  Perform _10__ feet of side stepping right. Perform 10 feet of side stepping left. Perform 4 times, 3 times a week.  Copyright  VHI. All rights reserved.   Forward Lunge    Standing with feet shoulder width apart, step forward into lunge with left leg. Hold __2__ seconds. Repeat __5__ times per session. Do __1__ sessions per day, 3 days a week. Repeat with opposite leg.  Copyright  VHI. All rights reserved.

## 2015-12-26 NOTE — Therapy (Signed)
[name] [address] [place], [name], [address] Phone: [phone]   Fax:  [phone]  Physical Therapy Treatment  Patient Details  Name: [name] MRN: [id number] Date of Birth: Aug 21, 1955 Referring Provider: Dr. Everette Rank  Encounter Date: 12/26/2015      PT End of Session - 12/26/15 1642    Visit Number 3   Number of Visits 9   Date for PT Re-Evaluation 01/03/16   Authorization Type UHC Medicare: G-code every 10th visit.   PT Start Time 1407  pt arrived late   PT Stop Time 1446   PT Time Calculation (min) 39 min   Equipment Utilized During Treatment --  S for safety   Activity Tolerance Patient tolerated treatment well   Behavior During Therapy WFL for tasks assessed/performed      Past Medical History  Diagnosis Date  . MS (multiple sclerosis) ([place])   . Rhinitis   . Varicose veins   . Superficial phlebitis   . Mood disorder ([place])   . Depressive reaction   . Diverticulitis   . Diverticulosis   . Anemia   . Gastropathy 2013    reactive  . Frequent UTI LAST 7/13  . Blood in stool, frank 12/01/2011    Past Surgical History  Procedure Laterality Date  . Tonsillectomy and adenoidectomy    . Bladder surgery  2008    Tack  . Abdominal hysterectomy  2008  . Shoulder arthroscopy with rotator cuff repair and subacromial decompression  06/01/2012    Procedure: SHOULDER ARTHROSCOPY WITH ROTATOR CUFF REPAIR AND SUBACROMIAL DECOMPRESSION;  Surgeon: Magnus Sinning, MD;  Location: [facility];  Service: Orthopedics;  Laterality: Left;  LEFT SHOULDER ARTHROSCOPY WITH SUBACROMIAL DECOMPRESION AND DEBRIDEMENT OF PARTIAL ROTATOR CUFF TEAR  . Abdominal hysterectomy      There were no vitals filed for this visit.      Subjective Assessment - 12/26/15 1409    Subjective Pt denied falls or changes since last visit.    Pertinent History HTN (per chart), MS, depression, dizziness   Patient Stated Goals Making it more natural to pick up my feet, improve balance    Currently in Pain? Yes   Pain Score 3    Pain Location Foot   Pain Orientation Left   Pain Descriptors / Indicators Aching   Pain Type Chronic pain   Pain Onset 1 to 4 weeks ago   Pain Frequency Intermittent   Aggravating Factors  nothing   Pain Relieving Factors lying down                    Therex: Pt performed strengthening HEP. Cues for technique, please see pt instructions for details. Pt required rest breaks to ensure energy conservation.        Self Care:     PT Education - 12/26/15 1641    Education provided Yes   Education Details PT provided pt with strengthening HEP and energy conservation technique.   Person(s) Educated Patient   Methods Explanation;Demonstration;Verbal cues;Handout   Comprehension Returned demonstration;Verbalized understanding          PT Short Term Goals - 12/04/15 1333    PT SHORT TERM GOAL #1   Title same as LTGs           PT Long Term Goals - 12/26/15 1643    PT LONG TERM GOAL #1   Title Pt will verbalize fall prevention strategies to decr. falls risk. Target  date: 01/01/16   Status On-going   PT LONG TERM GOAL #2   Title Pt will verbalize energy conservation strategies to improve endurance and decr. falls risk. Target date: 01/01/16   Status On-going   PT LONG TERM GOAL #3   Title Pt will improve DGI score to >/=20/24 to decrease falls risk. Target date: 01/01/16   Baseline 17/24 on 12/21/15   Status On-going   PT LONG TERM GOAL #4   Title Pt will amb. 600' over even/uneven surfaces with LRAD at MOD I level to improve functional mobility. Target date: 01/01/16   Status On-going               Plan - 12/26/15 1642    Clinical Impression Statement Pt tolerated new strengthening HEP well. Pt continues to require cues for energy conservation, as she reports she "over does it" and is then exhausted. Therefore, PT provided pt with  energy conservations technique handouts. PT palpated and assessed L dorsum of foot, pt reported pain did not incr. With palpation, sligh redness noted at base of first metatarsal, which pt reports is typical due to bony prominence. PT suggested pt notify MD if pain does not resolve. Continue with POC.    Rehab Potential Good   Clinical Impairments Affecting Rehab Potential co-morbidities (secondary progressive MS)   PT Frequency 2x / week   PT Duration 4 weeks   PT Treatment/Interventions ADLs/Self Care Home Management;Biofeedback;Canalith Repostioning;Electrical Stimulation;Therapeutic exercise;Balance training;Manual techniques;Vestibular;Therapeutic activities;Orthotic Fit/Training;Functional mobility training;Stair training;Gait training;Patient/family education;DME Instruction;Neuromuscular re-education   PT Next Visit Plan Review HEP prn, amb. with SPC. Perform heel/toe raises   Consulted and Agree with Plan of Care Patient      Patient will benefit from skilled therapeutic intervention in order to improve the following deficits and impairments:  Pain, Decreased coordination, Decreased balance, Decreased mobility, Impaired tone, Decreased knowledge of use of DME, Decreased strength, Abnormal gait, Decreased endurance (PT will not directly address pain but will monitor closely.)  Visit Diagnosis: Muscle weakness (generalized)  Other abnormalities of gait and mobility  Unspecified lack of coordination     Problem List Patient Active Problem List   Diagnosis Date Noted  . Nausea alone 02/13/2014  . Other malaise and fatigue 02/13/2014  . Postmenopausal estrogen deficiency 02/24/2013  . Visit for preventive health examination 02/24/2013  . Family hx osteoporosis 02/21/2013  . Recurrent UTI 01/09/2012  . Suprapubic tenderness 12/01/2011  . GERD (gastroesophageal reflux disease) 10/15/2011  . Dysphagia, pharyngoesophageal 10/15/2011  . Voiding dysfunction 09/24/2011  .  Hypertriglyceridemia 02/09/2011  . VITAMIN D DEFICIENCY 12/13/2008  . CONSTIPATION 12/13/2008  . MUSCLE SPASM 12/13/2008  . DIZZINESS 12/13/2008  . HYPOKALEMIA, HX OF 12/13/2008  . DISORDER, ADJUSTMENT W/DEPRESSED MOOD 04/30/2007  . SCLEROSIS, MULTIPLE 04/30/2007  . HYPERTENSION 04/30/2007  . VARICOSE VEIN, LWR EXTREMITIES W/INFLAMMATION 04/30/2007  . VASOMOTOR RHINITIS 04/30/2007    Giulietta Prokop L 12/26/2015, 4:44 PM  [facility] [address] [place], [name], [address] Phone: [phone]   Fax:  [phone]  Name: KAYRI STMARIE MRN: [id number] Date of Birth: 1955/08/12    [name], PT,DPT 12/26/2015 4:44 PM Phone: [phone] Fax: [phone]

## 2015-12-27 DIAGNOSIS — M79642 Pain in left hand: Secondary | ICD-10-CM | POA: Diagnosis not present

## 2015-12-28 ENCOUNTER — Ambulatory Visit: Payer: Medicare Other | Attending: Neurology

## 2015-12-28 DIAGNOSIS — R2689 Other abnormalities of gait and mobility: Secondary | ICD-10-CM | POA: Diagnosis not present

## 2015-12-28 DIAGNOSIS — R279 Unspecified lack of coordination: Secondary | ICD-10-CM | POA: Insufficient documentation

## 2015-12-28 DIAGNOSIS — M6281 Muscle weakness (generalized): Secondary | ICD-10-CM | POA: Diagnosis not present

## 2015-12-28 NOTE — Therapy (Signed)
[name] [address] [place], [name], [address] Phone: [phone]   Fax:  [phone]  Physical Therapy Treatment  Patient Details  Name: [name] MRN: [id number] Date of Birth: 1956/05/14 Referring Provider: Dr. Everette Rank  Encounter Date: 12/28/2015      PT End of Session - 12/28/15 1158    Visit Number 4   Number of Visits 9   Date for PT Re-Evaluation 01/03/16   Authorization Type UHC Medicare: G-code every 10th visit.   PT Start Time 1018   PT Stop Time 1059   PT Time Calculation (min) 41 min   Equipment Utilized During Treatment Gait belt   Activity Tolerance Patient tolerated treatment well   Behavior During Therapy WFL for tasks assessed/performed      Past Medical History  Diagnosis Date  . MS (multiple sclerosis) ([place])   . Rhinitis   . Varicose veins   . Superficial phlebitis   . Mood disorder ([place])   . Depressive reaction   . Diverticulitis   . Diverticulosis   . Anemia   . Gastropathy 2013    reactive  . Frequent UTI LAST 7/13  . Blood in stool, frank 12/01/2011    Past Surgical History  Procedure Laterality Date  . Tonsillectomy and adenoidectomy    . Bladder surgery  2008    Tack  . Abdominal hysterectomy  2008  . Shoulder arthroscopy with rotator cuff repair and subacromial decompression  06/01/2012    Procedure: SHOULDER ARTHROSCOPY WITH ROTATOR CUFF REPAIR AND SUBACROMIAL DECOMPRESSION;  Surgeon: Magnus Sinning, MD;  Location: [facility];  Service: Orthopedics;  Laterality: Left;  LEFT SHOULDER ARTHROSCOPY WITH SUBACROMIAL DECOMPRESION AND DEBRIDEMENT OF PARTIAL ROTATOR CUFF TEAR  . Abdominal hysterectomy      There were no vitals filed for this visit.      Subjective Assessment - 12/28/15 1020    Subjective Pt reported her L foot is feeling a lot better today. Pt reported HEP is going well.   Pertinent History HTN (per chart), MS, depression,  dizziness   Patient Stated Goals Making it more natural to pick up my feet, improve balance    Currently in Pain? No/denies         Therex and neuro re-ed: Pt performed previous and new HEP (balance and strengthening) with cues for technique. Please see pt instructions for details. PT also provided pt with cervical retraction in supine, x10 reps.                         PT Education - 12/28/15 1157    Education provided Yes   Education Details PT reviewed and progressed HEP as tolerated as pt reported some exercises were becoming easy.    Person(s) Educated Patient   Methods Explanation;Demonstration;Tactile cues;Verbal cues;Handout   Comprehension Returned demonstration;Verbalized understanding          PT Short Term Goals - 12/04/15 1333    PT SHORT TERM GOAL #1   Title same as LTGs           PT Long Term Goals - 12/26/15 1643    PT LONG TERM GOAL #1   Title Pt will verbalize fall prevention strategies to decr. falls risk. Target date: 01/01/16   Status On-going   PT LONG TERM GOAL #2   Title Pt will verbalize energy conservation strategies to improve endurance and decr. falls risk. Target date: 01/01/16   Status  On-going   PT LONG TERM GOAL #3   Title Pt will improve DGI score to >/=20/24 to decrease falls risk. Target date: 01/01/16   Baseline 17/24 on 12/21/15   Status On-going   PT LONG TERM GOAL #4   Title Pt will amb. 600' over even/uneven surfaces with LRAD at MOD I level to improve functional mobility. Target date: 01/01/16   Status On-going               Plan - 12/28/15 1158    Clinical Impression Statement Pt demonstrated improved balance and strength, as she was able to perform former balance HEP without UE support and incr. reps during lunges. Pt's endurance is incr. as she required less rest breaks. Continue with POC.    Rehab Potential Good   Clinical Impairments Affecting Rehab Potential co-morbidities (secondary progressive MS)    PT Frequency 2x / week   PT Duration 4 weeks   PT Treatment/Interventions ADLs/Self Care Home Management;Biofeedback;Canalith Repostioning;Electrical Stimulation;Therapeutic exercise;Balance training;Manual techniques;Vestibular;Therapeutic activities;Orthotic Fit/Training;Functional mobility training;Stair training;Gait training;Patient/family education;DME Instruction;Neuromuscular re-education   PT Next Visit Plan Progress wall squats prn, amb. with SPC. Perform bed mobility as pt reports difficulty.   Consulted and Agree with Plan of Care Patient      Patient will benefit from skilled therapeutic intervention in order to improve the following deficits and impairments:  Pain, Decreased coordination, Decreased balance, Decreased mobility, Impaired tone, Decreased knowledge of use of DME, Decreased strength, Abnormal gait, Decreased endurance (PT will not directly address pain but will monitor closely.)  Visit Diagnosis: Other abnormalities of gait and mobility  Muscle weakness (generalized)     Problem List Patient Active Problem List   Diagnosis Date Noted  . Nausea alone 02/13/2014  . Other malaise and fatigue 02/13/2014  . Postmenopausal estrogen deficiency 02/24/2013  . Visit for preventive health examination 02/24/2013  . Family hx osteoporosis 02/21/2013  . Recurrent UTI 01/09/2012  . Suprapubic tenderness 12/01/2011  . GERD (gastroesophageal reflux disease) 10/15/2011  . Dysphagia, pharyngoesophageal 10/15/2011  . Voiding dysfunction 09/24/2011  . Hypertriglyceridemia 02/09/2011  . VITAMIN D DEFICIENCY 12/13/2008  . CONSTIPATION 12/13/2008  . MUSCLE SPASM 12/13/2008  . DIZZINESS 12/13/2008  . HYPOKALEMIA, HX OF 12/13/2008  . DISORDER, ADJUSTMENT W/DEPRESSED MOOD 04/30/2007  . SCLEROSIS, MULTIPLE 04/30/2007  . HYPERTENSION 04/30/2007  . VARICOSE VEIN, LWR EXTREMITIES W/INFLAMMATION 04/30/2007  . VASOMOTOR RHINITIS 04/30/2007    [name] L 12/28/2015, 12:02  PM  [facility] [address] [place], [name], [address] Phone: [phone]   Fax:  [phone]  Name: [name] MRN: [id number] Date of Birth: [birth year]    [name], PT,DPT 12/28/2015 12:02 PM Phone: [phone] Fax: [phone]

## 2015-12-28 NOTE — Patient Instructions (Signed)
Walking on Heels    Walk on heels for _10__ feet while continuing on a straight path. Repeat 4 times. Do _1__ sessions per day.  Copyright  VHI. All rights reserved.  Walking on [employer] onto counter. Walk on toes for __10__ feet while continuing on a straight path. Repeat 4 times.  Do _1___ sessions per day.  Copyright  VHI. All rights reserved.   Perform at kitchen counter for safety:   Side to Side Head Motion    Perform without assistive device. Walking on solid surface, turn head and eyes to left for __2__ steps. Then, turn head and eyes to opposite side for __2__ steps. Repeat sequence _4___ times per session. Do __1__ sessions per day.   Copyright  VHI. All rights reserved.  Up / Down Head Motion    Perform without assistive device. Walking on solid surface, move head and eyes toward ceiling for __2__ steps. Then, move head and eyes toward floor for __2__ steps. Repeat __4__ times per session. Do __1__ sessions per day.  Copyright  VHI. All rights reserved.  Backward    Walk backwards with eyes open. Take 10 even steps, making sure each foot lifts off floor. Repeat for __4__ times per session. Do __1__ sessions per day.   Copyright  VHI. All rights reserved.  Single Leg - Eyes Open    Hovering hands at counter, lift right leg while maintaining balance over other leg.  Perform lifting leg as well. Hold__10__ seconds. Repeat __3__ times per session with each leg. Do __1__ sessions per day.  Copyright  VHI. All rights reserved.      Pec stretch: with both arms up on wall, shift weight forward so you can feel stretch in shoulder/chest. Hold for 30 seconds. Repeat 3 times. 2-3 times per day.  Forward Lunge    Standing with feet shoulder width apart, step forward into lunge with left leg. Hold __2__ seconds. Repeat __10__ times per session. Do __1__ sessions per day, 3 days a week. Repeat with opposite leg.  Copyright  VHI. All  rights reserved.    Scapular retraction: Perform with red band tied to post, and elbows tucked at your sides. Squeeze shoulder blades towards spine and hold for 2 sec. Repeat 3 sets of 10 reps. Perform 3 times per week.  Cervical retraction. See handout.

## 2016-01-01 ENCOUNTER — Ambulatory Visit: Payer: Medicare Other

## 2016-01-02 DIAGNOSIS — S62657D Nondisplaced fracture of medial phalanx of left little finger, subsequent encounter for fracture with routine healing: Secondary | ICD-10-CM | POA: Diagnosis not present

## 2016-01-03 ENCOUNTER — Other Ambulatory Visit: Payer: Self-pay | Admitting: Podiatry

## 2016-01-03 DIAGNOSIS — M79642 Pain in left hand: Secondary | ICD-10-CM | POA: Diagnosis not present

## 2016-01-04 ENCOUNTER — Ambulatory Visit: Payer: Medicare Other

## 2016-01-04 DIAGNOSIS — R279 Unspecified lack of coordination: Secondary | ICD-10-CM | POA: Diagnosis not present

## 2016-01-04 DIAGNOSIS — M6281 Muscle weakness (generalized): Secondary | ICD-10-CM

## 2016-01-04 DIAGNOSIS — R2689 Other abnormalities of gait and mobility: Secondary | ICD-10-CM

## 2016-01-04 NOTE — Patient Instructions (Signed)
Wall-Sit    With back pressed against a wall, bend knees and slide buttocks down until knees are about 45 or more. Stay in this position for _5__ seconds. Squeeze buttocks to stand upright. Repeat _10__ times. Do _3__ times per week.  Copyright  VHI. All rights reserved.   Side Stepping With Semi-Squat    Tie yellow band above knees. Hold onto counter and perform mini squats and side step to the right.  Perform _10__ feet of side stepping right. Perform 10 feet of side stepping left. Perform 4 times, 3 times a week.  Copyright  VHI. All rights reserved.   Forward Lunge    Standing with feet shoulder width apart, step forward into lunge with left leg. Hold __2__ seconds. Repeat __10__ times per session. Do __1__ sessions per day, 3 days a week. Repeat with opposite leg.

## 2016-01-04 NOTE — Therapy (Signed)
[facility] [address] [place], [name], [address] Phone: [phone]   Fax:  [phone]  Physical Therapy Treatment  Patient Details  Name: [name] MRN: [id number] Date of Birth: [birth year] Referring Provider: Dr. Everette Rank  Encounter Date: 01/04/2016      PT End of Session - 01/04/16 1630    Visit Number 5   Number of Visits 9   Date for PT Re-Evaluation 01/03/16  PT requesting add'l 4 weeks: 02/02/16   Authorization Type UHC Medicare: G-code every 10th visit.   PT Start Time 1450  pt arrived late   PT Stop Time 1530   PT Time Calculation (min) 40 min   Equipment Utilized During Treatment Gait belt   Activity Tolerance Patient tolerated treatment well   Behavior During Therapy WFL for tasks assessed/performed      Past Medical History  Diagnosis Date  . MS (multiple sclerosis) ([place])   . Rhinitis   . Varicose veins   . Superficial phlebitis   . Mood disorder ([place])   . Depressive reaction   . Diverticulitis   . Diverticulosis   . Anemia   . Gastropathy 2013    reactive  . Frequent UTI LAST 7/13  . Blood in stool, frank 12/01/2011    Past Surgical History  Procedure Laterality Date  . Tonsillectomy and adenoidectomy    . Bladder surgery  2008    Tack  . Abdominal hysterectomy  2008  . Shoulder arthroscopy with rotator cuff repair and subacromial decompression  06/01/2012    Procedure: SHOULDER ARTHROSCOPY WITH ROTATOR CUFF REPAIR AND SUBACROMIAL DECOMPRESSION;  Surgeon: Magnus Sinning, MD;  Location: [facility];  Service: Orthopedics;  Laterality: Left;  LEFT SHOULDER ARTHROSCOPY WITH SUBACROMIAL DECOMPRESION AND DEBRIDEMENT OF PARTIAL ROTATOR CUFF TEAR  . Abdominal hysterectomy      There were no vitals filed for this visit.      Subjective Assessment - 01/04/16 1454    Subjective Pt reported she fell earlier this week, when trying to walk backwards to grab some items  from fridge (bumped counter and didn't fall to the floor). Pt reported she did not feel well earlier this week and that's why she cancelled appt. Pt states she is feeling better now. Pt had some questions about HEP.    Pertinent History HTN (per chart), MS, depression, dizziness   Patient Stated Goals Making it more natural to pick up my feet, improve balance             [facility] PT Assessment - 01/04/16 1510    Functional Gait  Assessment   Gait assessed  Yes   Gait Level Surface Walks 20 ft in less than 7 sec but greater than 5.5 sec, uses assistive device, slower speed, mild gait deviations, or deviates 6-10 in outside of the 12 in walkway width.   Change in Gait Speed Able to change speed, demonstrates mild gait deviations, deviates 6-10 in outside of the 12 in walkway width, or no gait deviations, unable to achieve a major change in velocity, or uses a change in velocity, or uses an assistive device.   Gait with Horizontal Head Turns Performs head turns smoothly with slight change in gait velocity (eg, minor disruption to smooth gait path), deviates 6-10 in outside 12 in walkway width, or uses an assistive device.   Gait with Vertical Head Turns Performs task with slight change in gait velocity (eg, minor disruption to smooth gait path), deviates  6 - 10 in outside 12 in walkway width or uses assistive device   Gait and Pivot Turn Pivot turns safely in greater than 3 sec and stops with no loss of balance, or pivot turns safely within 3 sec and stops with mild imbalance, requires small steps to catch balance.   Step Over Obstacle Is able to step over one shoe box (4.5 in total height) without changing gait speed. No evidence of imbalance.   Gait with Narrow Base of Support Ambulates 7-9 steps.  8 steps   Gait with Eyes Closed Walks 20 ft, slow speed, abnormal gait pattern, evidence for imbalance, deviates 10-15 in outside 12 in walkway width. Requires more than 9 sec to ambulate 20 ft.  9.1sec.    Ambulating Backwards Walks 20 ft, uses assistive device, slower speed, mild gait deviations, deviates 6-10 in outside 12 in walkway width.   Steps Alternating feet, must use rail.   Total Score 19                     [facility] Adult PT Treatment/Exercise - 01/04/16 1510    Ambulation/Gait   Ambulation/Gait Yes   Ambulation/Gait Assistance 5: Supervision;6: Modified independent (Device/Increase time)   Ambulation/Gait Assistance Details S-MOD I level with rollator and min guard to S without AD. Cues to improve R knee flexion and toe clearance without rollator. Pt noted to experience incr. postural sway when traversing corners.   Ambulation Distance (Feet) 400 Feet  indoors/outdoors with and without rollator   Assistive device Rollator;None   Gait Pattern Step-to pattern;Decreased stride length;Decreased hip/knee flexion - right;Narrow base of support;Decreased dorsiflexion - right;Decreased dorsiflexion - left   Ambulation Surface Level;Unlevel;Indoor;Outdoor;Paved;Grass   Dynamic Gait Index   Level Surface Mild Impairment   Change in Gait Speed Mild Impairment   Gait with Horizontal Head Turns Normal   Gait with Vertical Head Turns Normal   Gait and Pivot Turn Mild Impairment   Step Over Obstacle Normal   Step Around Obstacles Normal   Steps Mild Impairment   Total Score 20                PT Education - 01/04/16 1630    Education provided Yes   Education Details Reviewed HEP and printed off previous HEP as pt reported she was missing some exercises. PT discussed goal progress and renewing PT, pt agreeable.   Person(s) Educated Patient   Methods Explanation   Comprehension Verbalized understanding          PT Short Term Goals - 12/04/15 1333    PT SHORT TERM GOAL #1   Title same as LTGs           PT Long Term Goals - 01/04/16 1634    PT LONG TERM GOAL #1   Title Pt will verbalize fall prevention strategies to decr. falls risk. Target date: 01/01/16    Baseline All unmet goals will be carried over to new POC: 02/01/16 and will be re-assessed in 2 weeks from 01/04/16   Status Deferred   PT LONG TERM GOAL #2   Title Pt will verbalize energy conservation strategies to improve endurance and decr. falls risk. Target date: 01/01/16   Status Deferred   PT LONG TERM GOAL #3   Title Pt will improve DGI score to >/=20/24 to decrease falls risk. Target date: 01/01/16   Baseline 17/24 on 12/21/15   Status Achieved   PT LONG TERM GOAL #4   Title Pt will amb.  600' over even/uneven surfaces with LRAD at MOD I level to improve functional mobility. Target date: 01/01/16   Status Achieved   PT LONG TERM GOAL #5   Title Pt will improve FGA score to >/=25/30 to decr. falls risk. Target date: 02/01/16   Status New   Additional Long Term Goals   Additional Long Term Goals Yes   PT LONG TERM GOAL #6   Title Assess bed mobility and write goal. Target date: 02/01/16   Status New   PT LONG TERM GOAL #7   Title Pt will be IND in progressed HEP to improve strength and balance. Target date: 02/01/16   Status New               Plan - 01/04/16 1631    Clinical Impression Statement Pt met LTG 3 and partially met LTG 4, LTGs 1 and 2 deferred to new POC: 02/02/16. Although pt's DGI score of 20/24 indicates pt is at a lower risk for falls, pt's FGA score of 19/30 indicates she is still at a medium risk for falls. PT requesting additional 2x/week for 4 weeks as pt missed appt's due to scheduling conflicts and illness. Pt would continue benefit from skilled PT to improve safety during functional mobility. PT will re-assess at 2 weeks and decide upon d/c at that time.    Rehab Potential Good   Clinical Impairments Affecting Rehab Potential co-morbidities (secondary progressive MS)   PT Frequency 2x / week   PT Duration 4 weeks   PT Treatment/Interventions ADLs/Self Care Home Management;Biofeedback;Canalith Repostioning;Electrical Stimulation;Therapeutic exercise;Balance  training;Manual techniques;Vestibular;Therapeutic activities;Orthotic Fit/Training;Functional mobility training;Stair training;Gait training;Patient/family education;DME Instruction;Neuromuscular re-education   PT Next Visit Plan Progress wall squats prn, amb. with SPC. Perform bed mobility and write goal.    PT Home Exercise Plan Strengthening and balance HEP.   Consulted and Agree with Plan of Care Patient      Patient will benefit from skilled therapeutic intervention in order to improve the following deficits and impairments:  Pain, Decreased coordination, Decreased balance, Decreased mobility, Impaired tone, Decreased knowledge of use of DME, Decreased strength, Abnormal gait, Decreased endurance (PT will not directly address pain but will monitor closely.)  Visit Diagnosis: Other abnormalities of gait and mobility - Plan: PT plan of care cert/re-cert  Muscle weakness (generalized) - Plan: PT plan of care cert/re-cert  Unspecified lack of coordination - Plan: PT plan of care cert/re-cert     Problem List Patient Active Problem List   Diagnosis Date Noted  . Nausea alone 02/13/2014  . Other malaise and fatigue 02/13/2014  . Postmenopausal estrogen deficiency 02/24/2013  . Visit for preventive health examination 02/24/2013  . Family hx osteoporosis 02/21/2013  . Recurrent UTI 01/09/2012  . Suprapubic tenderness 12/01/2011  . GERD (gastroesophageal reflux disease) 10/15/2011  . Dysphagia, pharyngoesophageal 10/15/2011  . Voiding dysfunction 09/24/2011  . Hypertriglyceridemia 02/09/2011  . VITAMIN D DEFICIENCY 12/13/2008  . CONSTIPATION 12/13/2008  . MUSCLE SPASM 12/13/2008  . DIZZINESS 12/13/2008  . HYPOKALEMIA, HX OF 12/13/2008  . DISORDER, ADJUSTMENT W/DEPRESSED MOOD 04/30/2007  . SCLEROSIS, MULTIPLE 04/30/2007  . HYPERTENSION 04/30/2007  . VARICOSE VEIN, LWR EXTREMITIES W/INFLAMMATION 04/30/2007  . VASOMOTOR RHINITIS 04/30/2007    Darci Lykins L 01/04/2016, 4:46  PM  [facility] [address] [place], [name], [address] Phone: [phone]   Fax:  [phone]  Name: [name] MRN: [id number] Date of Birth: September 18, 1955    [name], PT,DPT 01/04/2016 4:46 PM Phone: [phone] Fax:  [phone]

## 2016-01-08 ENCOUNTER — Ambulatory Visit: Payer: Medicare Other

## 2016-01-08 DIAGNOSIS — R279 Unspecified lack of coordination: Secondary | ICD-10-CM

## 2016-01-08 DIAGNOSIS — M6281 Muscle weakness (generalized): Secondary | ICD-10-CM

## 2016-01-08 DIAGNOSIS — R2689 Other abnormalities of gait and mobility: Secondary | ICD-10-CM

## 2016-01-08 NOTE — Patient Instructions (Signed)
Bridge    Lie back, legs bent. Press hips up towards ceiling, while tucking tummy in (performing pelvic floor contraction to activate deep ab muscles). Slowly lower back down. Repeat __10__ times. Do __3__ sessions per set. Perform 3 days a week.  [email or web address].exer.us/55   Copyright  VHI. All rights reserved.

## 2016-01-08 NOTE — Therapy (Signed)
[facility] [address] [place], [name], [address] Phone: [phone]   Fax:  [phone]  Physical Therapy Treatment  Patient Details  Name: [name] MRN: [id number] Date of Birth: 08/15/1955 Referring Provider: Dr. Everette Rank  Encounter Date: 01/08/2016      PT End of Session - 01/08/16 1711    Visit Number 6   Number of Visits 9   Date for PT Re-Evaluation 02/02/16   Authorization Type UHC Medicare: G-code every 10th visit.   PT Start Time 1401   PT Stop Time 1443   PT Time Calculation (min) 42 min   Equipment Utilized During Treatment Gait belt   Activity Tolerance Patient tolerated treatment well   Behavior During Therapy WFL for tasks assessed/performed      Past Medical History  Diagnosis Date  . MS (multiple sclerosis) ([place])   . Rhinitis   . Varicose veins   . Superficial phlebitis   . Mood disorder ([place])   . Depressive reaction   . Diverticulitis   . Diverticulosis   . Anemia   . Gastropathy 2013    reactive  . Frequent UTI LAST 7/13  . Blood in stool, frank 12/01/2011    Past Surgical History  Procedure Laterality Date  . Tonsillectomy and adenoidectomy    . Bladder surgery  2008    Tack  . Abdominal hysterectomy  2008  . Shoulder arthroscopy with rotator cuff repair and subacromial decompression  06/01/2012    Procedure: SHOULDER ARTHROSCOPY WITH ROTATOR CUFF REPAIR AND SUBACROMIAL DECOMPRESSION;  Surgeon: Magnus Sinning, MD;  Location: [facility];  Service: Orthopedics;  Laterality: Left;  LEFT SHOULDER ARTHROSCOPY WITH SUBACROMIAL DECOMPRESION AND DEBRIDEMENT OF PARTIAL ROTATOR CUFF TEAR  . Abdominal hysterectomy      There were no vitals filed for this visit.      Subjective Assessment - 01/08/16 1404    Subjective Pt reported she is tired today, due to the heat. Pt reports it takes a few weeks for her body to become acclimated to change of seasons.   Pertinent History HTN (per chart), MS, depression, dizziness   Patient Stated Goals Making it more natural to pick up my feet, improve balance    Currently in Pain? No/denies                         [facility] Adult PT Treatment/Exercise - 01/08/16 1406    Bed Mobility   Bed Mobility Rolling Right;Rolling Left;Scooting to [facility];Supine to Sit;Sit to Supine   Rolling Right 7: Independent   Rolling Left 7: Independent   Supine to Sit 5: Supervision   Supine to Sit Details (indicate cue type and reason) To ensure safety. Pt performs quadriped position to txf into bed, as she reports her bed at home is tall.   Sit to Supine 5: Supervision   Sit to Supine - Details (indicate cue type and reason) To ensure safety.   Scooting to [employer] 5: Supervision   Scooting to [facility] Details (indicate cue type and reason) S and cues for technique.   Ambulation/Gait   Ambulation/Gait Yes   Ambulation/Gait Assistance 4: Min guard   Ambulation/Gait Assistance Details Min guard during amb. with SPC, cues for sequencing.   Ambulation Distance (Feet) 460 Feet  and 345' and 115'   Assistive device Straight cane   Gait Pattern Step-to pattern;Decreased stride length;Decreased hip/knee flexion - right;Narrow base of support;Decreased dorsiflexion - right;Decreased dorsiflexion -  left   Ambulation Surface Level;Indoor   High Level Balance   High Level Balance Activities Head turns  head nods and forward amb.   High Level Balance Comments Performed over red and blue mats with min guard to min A to maintain balance with and without SPC. Cues to improve narrow BOS and for technique.     therex: Pt performed 3x10 sets of bridges with cues for technique. Please see pt instructions for details.           PT Education - 01/08/16 1711    Education provided Yes   Education Details Provided pt with bridges HEP to improve bed mobility.   Person(s) Educated Patient   Methods Explanation;Handout;Verbal cues    Comprehension Verbalized understanding;Returned demonstration          PT Short Term Goals - 12/04/15 1333    PT SHORT TERM GOAL #1   Title same as LTGs           PT Long Term Goals - 01/08/16 1714    PT LONG TERM GOAL #1   Title Pt will verbalize fall prevention strategies to decr. falls risk. Target date: 01/01/16   Baseline All unmet goals will be carried over to new POC: 02/01/16 and will be re-assessed in 2 weeks from 01/04/16   Status On-going   PT LONG TERM GOAL #2   Title Pt will verbalize energy conservation strategies to improve endurance and decr. falls risk. Target date: 01/01/16   Status On-going   PT LONG TERM GOAL #3   Title Pt will improve DGI score to >/=20/24 to decrease falls risk. Target date: 01/01/16   Baseline 17/24 on 12/21/15   Status Achieved   PT LONG TERM GOAL #4   Title Pt will amb. 600' over even/uneven surfaces with LRAD at MOD I level to improve functional mobility. Target date: 01/01/16   Status Achieved   PT LONG TERM GOAL #5   Title Pt will improve FGA score to >/=25/30 to decr. falls risk. Target date: 02/01/16   Status On-going   PT LONG TERM GOAL #6   Title Pt will perform scooting in bed in all directions IND to improve bed mobility at home. Target date: 02/01/16   Baseline S for safety and cues for technique.   Status Revised   PT LONG TERM GOAL #7   Title Pt will be IND in progressed HEP to improve strength and balance. Target date: 02/01/16   Status On-going               Plan - 01/08/16 1712    Clinical Impression Statement Pt demonstrated progress, as she was able to amb. over compliant surfaces with and without SPC. Pt continues to require cues for sequencing with SPC and required seated rest breaks during session 2/2 fatigue. Pt experienced incr. difficulty performing scooting in bed 2/2 decr. strength in hip extensors and abductors, PT provided pt with bridges HEP to improve strength and bed mobilty. Continue with POC.    Rehab Potential  Good   Clinical Impairments Affecting Rehab Potential co-morbidities (secondary progressive MS)   PT Frequency 2x / week   PT Duration 4 weeks   PT Treatment/Interventions ADLs/Self Care Home Management;Biofeedback;Canalith Repostioning;Electrical Stimulation;Therapeutic exercise;Balance training;Manual techniques;Vestibular;Therapeutic activities;Orthotic Fit/Training;Functional mobility training;Stair training;Gait training;Patient/family education;DME Instruction;Neuromuscular re-education   PT Next Visit Plan Progress wall squats prn, amb. with SPC. Strengthening and high level balance.   PT Home Exercise Plan Strengthening and balance HEP.   Consulted and  Agree with Plan of Care Patient      Patient will benefit from skilled therapeutic intervention in order to improve the following deficits and impairments:  Pain, Decreased coordination, Decreased balance, Decreased mobility, Impaired tone, Decreased knowledge of use of DME, Decreased strength, Abnormal gait, Decreased endurance  Visit Diagnosis: Other abnormalities of gait and mobility  Muscle weakness (generalized)  Unspecified lack of coordination     Problem List Patient Active Problem List   Diagnosis Date Noted  . Nausea alone 02/13/2014  . Other malaise and fatigue 02/13/2014  . Postmenopausal estrogen deficiency 02/24/2013  . Visit for preventive health examination 02/24/2013  . Family hx osteoporosis 02/21/2013  . Recurrent UTI 01/09/2012  . Suprapubic tenderness 12/01/2011  . GERD (gastroesophageal reflux disease) 10/15/2011  . Dysphagia, pharyngoesophageal 10/15/2011  . Voiding dysfunction 09/24/2011  . Hypertriglyceridemia 02/09/2011  . VITAMIN D DEFICIENCY 12/13/2008  . CONSTIPATION 12/13/2008  . MUSCLE SPASM 12/13/2008  . DIZZINESS 12/13/2008  . HYPOKALEMIA, HX OF 12/13/2008  . DISORDER, ADJUSTMENT W/DEPRESSED MOOD 04/30/2007  . SCLEROSIS, MULTIPLE 04/30/2007  . HYPERTENSION 04/30/2007  . VARICOSE  VEIN, LWR EXTREMITIES W/INFLAMMATION 04/30/2007  . VASOMOTOR RHINITIS 04/30/2007    Miller,Jennifer L 01/08/2016, 5:16 PM  [facility] [address] [place], [name], [address] Phone: [phone]   Fax:  [phone]  Name: [name] MRN: [id number] Date of Birth: Jun 09, 1956    [name], PT,DPT 01/08/2016 5:16 PM Phone: [phone] Fax: [phone]

## 2016-01-09 DIAGNOSIS — Z79899 Other long term (current) drug therapy: Secondary | ICD-10-CM | POA: Diagnosis not present

## 2016-01-09 DIAGNOSIS — G35 Multiple sclerosis: Secondary | ICD-10-CM | POA: Diagnosis not present

## 2016-01-09 DIAGNOSIS — R2689 Other abnormalities of gait and mobility: Secondary | ICD-10-CM | POA: Diagnosis not present

## 2016-01-10 DIAGNOSIS — M79642 Pain in left hand: Secondary | ICD-10-CM | POA: Diagnosis not present

## 2016-01-11 ENCOUNTER — Ambulatory Visit: Payer: Medicare Other

## 2016-01-11 DIAGNOSIS — M6281 Muscle weakness (generalized): Secondary | ICD-10-CM

## 2016-01-11 DIAGNOSIS — R279 Unspecified lack of coordination: Secondary | ICD-10-CM | POA: Diagnosis not present

## 2016-01-11 DIAGNOSIS — R2689 Other abnormalities of gait and mobility: Secondary | ICD-10-CM

## 2016-01-11 NOTE — Therapy (Signed)
[facility] [address] [place], [name], [address] Phone: [phone]   Fax:  [phone]  Physical Therapy Treatment  Patient Details  Name: [name] MRN: [id number] Date of Birth: August 11, 1955 Referring Provider: Dr. Everette Rank  Encounter Date: 01/11/2016      PT End of Session - 01/11/16 1110    Visit Number 7   Number of Visits 9   Date for PT Re-Evaluation 02/02/16   Authorization Type UHC Medicare: G-code every 10th visit.   PT Start Time 1017   PT Stop Time 1058   PT Time Calculation (min) 41 min   Equipment Utilized During Treatment --  min guard to S for safety   Activity Tolerance Patient tolerated treatment well   Behavior During Therapy [facility] for tasks assessed/performed      Past Medical History  Diagnosis Date  . MS (multiple sclerosis) ([place])   . Rhinitis   . Varicose veins   . Superficial phlebitis   . Mood disorder ([place])   . Depressive reaction   . Diverticulitis   . Diverticulosis   . Anemia   . Gastropathy 2013    reactive  . Frequent UTI LAST 7/13  . Blood in stool, frank 12/01/2011    Past Surgical History  Procedure Laterality Date  . Tonsillectomy and adenoidectomy    . Bladder surgery  2008    Tack  . Abdominal hysterectomy  2008  . Shoulder arthroscopy with rotator cuff repair and subacromial decompression  06/01/2012    Procedure: SHOULDER ARTHROSCOPY WITH ROTATOR CUFF REPAIR AND SUBACROMIAL DECOMPRESSION;  Surgeon: Magnus Sinning, MD;  Location: [facility];  Service: Orthopedics;  Laterality: Left;  LEFT SHOULDER ARTHROSCOPY WITH SUBACROMIAL DECOMPRESION AND DEBRIDEMENT OF PARTIAL ROTATOR CUFF TEAR  . Abdominal hysterectomy      There were no vitals filed for this visit.      Subjective Assessment - 01/11/16 1021    Subjective Pt denied falls or changes since last visit. Pt reported she's having difficulty with wall squat HEP.   Pertinent History  HTN (per chart), MS, depression, dizziness   Patient Stated Goals Making it more natural to pick up my feet, improve balance    Currently in Pain? No/denies                         [facility] Adult PT Treatment/Exercise - 01/11/16 1028    Ambulation/Gait   Ambulation/Gait Yes   Ambulation/Gait Assistance 5: Supervision;4: Min guard;4: Min assist   Ambulation/Gait Assistance Details Pt amb. while performing head turns, S without head turns and min guard while performing head turns.  pt required min A during 2 LOB outdoors while performing head turns.   Ambulation Distance (Feet) 345 Feet  and 115 indoors and 400' outdoors   Assistive device Straight cane   Gait Pattern Step-to pattern;Decreased stride length;Decreased hip/knee flexion - right;Narrow base of support;Decreased dorsiflexion - right;Decreased dorsiflexion - left   Ambulation Surface Level;Unlevel;Indoor;Outdoor;Paved   High Level Balance   High Level Balance Activities Head turns;Turns   High Level Balance Comments Performed over red and blue mats: 4x7-15'/activity, pt performed head turns/nods, forward amb. with eyes closed and eyes open. Performed with 0-1 UE support and min guard to S to ensure safety. Cues for technique. Min guard during 3 LOB with eyes closed, which pt self corrected with UE support on // bars.   Exercises   Exercises Knee/Hip   Knee/Hip  Exercises: Machines for Strengthening   Cybex Leg Press Pt performed leg press 50lbs. 3x10 reps, cues to improve terminal R knee ext and B eccentric quad control.    Knee/Hip Exercises: Standing   Wall Squat 1 set;15 reps;5 seconds   Wall Squat Limitations PT reviewed wall squat HEP as pt reported difficulty performing at home. Pt was performing at 90 degree angle vs. 45 degrees, PT corrected and pt was able to perform without assist from squatting position. PT encouraged pt to use chair in front/side of pt while performing squats if she has difficulty obtaining  upright position.                PT Education - 01/11/16 1110    Education provided Yes   Education Details Reviewed wall squat and providing cues for 45 degree hip angle vs. 90 degrees.    Person(s) Educated Patient   Methods Explanation;Demonstration;Verbal cues;Handout   Comprehension Returned demonstration;Verbalized understanding          PT Short Term Goals - 12/04/15 1333    PT SHORT TERM GOAL #1   Title same as LTGs           PT Long Term Goals - 01/08/16 1714    PT LONG TERM GOAL #1   Title Pt will verbalize fall prevention strategies to decr. falls risk. Target date: 01/01/16   Baseline All unmet goals will be carried over to new POC: 02/01/16 and will be re-assessed in 2 weeks from 01/04/16   Status On-going   PT LONG TERM GOAL #2   Title Pt will verbalize energy conservation strategies to improve endurance and decr. falls risk. Target date: 01/01/16   Status On-going   PT LONG TERM GOAL #3   Title Pt will improve DGI score to >/=20/24 to decrease falls risk. Target date: 01/01/16   Baseline 17/24 on 12/21/15   Status Achieved   PT LONG TERM GOAL #4   Title Pt will amb. 600' over even/uneven surfaces with LRAD at MOD I level to improve functional mobility. Target date: 01/01/16   Status Achieved   PT LONG TERM GOAL #5   Title Pt will improve FGA score to >/=25/30 to decr. falls risk. Target date: 02/01/16   Status On-going   PT LONG TERM GOAL #6   Title Pt will perform scooting in bed in all directions IND to improve bed mobility at home. Target date: 02/01/16   Baseline S for safety and cues for technique.   Status Revised   PT LONG TERM GOAL #7   Title Pt will be IND in progressed HEP to improve strength and balance. Target date: 02/01/16   Status On-going               Plan - 01/11/16 1111    Clinical Impression Statement Pt demonstrated progress, as she was able to amb. with SPC while performing head turns over even/uneven terrain. Pt did require min A  during 2 LOB episode outdoors and cues to improve R knee/hip flexion and R heel strike outdoors. Continue with POC.    Rehab Potential Good   Clinical Impairments Affecting Rehab Potential co-morbidities (secondary progressive MS)   PT Frequency 2x / week   PT Duration 4 weeks   PT Treatment/Interventions ADLs/Self Care Home Management;Biofeedback;Canalith Repostioning;Electrical Stimulation;Therapeutic exercise;Balance training;Manual techniques;Vestibular;Therapeutic activities;Orthotic Fit/Training;Functional mobility training;Stair training;Gait training;Patient/family education;DME Instruction;Neuromuscular re-education   PT Next Visit Plan Review HEP and progress as tolerated. Assess goals next session.    PT  Home Exercise Plan Strengthening and balance HEP.   Consulted and Agree with Plan of Care Patient      Patient will benefit from skilled therapeutic intervention in order to improve the following deficits and impairments:  Pain, Decreased coordination, Decreased balance, Decreased mobility, Impaired tone, Decreased knowledge of use of DME, Decreased strength, Abnormal gait, Decreased endurance  Visit Diagnosis: Other abnormalities of gait and mobility  Muscle weakness (generalized)  Unspecified lack of coordination     Problem List Patient Active Problem List   Diagnosis Date Noted  . Nausea alone 02/13/2014  . Other malaise and fatigue 02/13/2014  . Postmenopausal estrogen deficiency 02/24/2013  . Visit for preventive health examination 02/24/2013  . Family hx osteoporosis 02/21/2013  . Recurrent UTI 01/09/2012  . Suprapubic tenderness 12/01/2011  . GERD (gastroesophageal reflux disease) 10/15/2011  . Dysphagia, pharyngoesophageal 10/15/2011  . Voiding dysfunction 09/24/2011  . Hypertriglyceridemia 02/09/2011  . VITAMIN D DEFICIENCY 12/13/2008  . CONSTIPATION 12/13/2008  . MUSCLE SPASM 12/13/2008  . DIZZINESS 12/13/2008  . HYPOKALEMIA, HX OF 12/13/2008  .  DISORDER, ADJUSTMENT W/DEPRESSED MOOD 04/30/2007  . SCLEROSIS, MULTIPLE 04/30/2007  . HYPERTENSION 04/30/2007  . VARICOSE VEIN, LWR EXTREMITIES W/INFLAMMATION 04/30/2007  . VASOMOTOR RHINITIS 04/30/2007    Constance Hackenberg L 01/11/2016, 11:14 AM  [facility] [address] [place], [name], [address] Phone: [phone]   Fax:  [phone]  Name: CATERRA ABELLO MRN: [id number] Date of Birth: 1956/04/20    [name], PT,DPT 01/11/2016 11:14 AM Phone: [phone] Fax: [phone]

## 2016-01-15 ENCOUNTER — Ambulatory Visit: Payer: Medicare Other

## 2016-01-15 DIAGNOSIS — R2689 Other abnormalities of gait and mobility: Secondary | ICD-10-CM

## 2016-01-15 DIAGNOSIS — R279 Unspecified lack of coordination: Secondary | ICD-10-CM

## 2016-01-15 DIAGNOSIS — M6281 Muscle weakness (generalized): Secondary | ICD-10-CM

## 2016-01-15 NOTE — Therapy (Signed)
[facility] [address] [place], [name], [address] Phone: [phone]   Fax:  [phone]  Physical Therapy Treatment  Patient Details  Name: [name] MRN: [id number] Date of Birth: 10/28/1955 Referring Provider: Dr. Everette Rank  Encounter Date: 01/15/2016      PT End of Session - 01/15/16 1127    Visit Number 8   Number of Visits 9   Date for PT Re-Evaluation 02/02/16   Authorization Type UHC Medicare: G-code every 10th visit.   PT Start Time 0847   PT Stop Time 0927   PT Time Calculation (min) 40 min   Equipment Utilized During Treatment Gait belt   Activity Tolerance Patient tolerated treatment well   Behavior During Therapy [facility] for tasks assessed/performed      Past Medical History  Diagnosis Date  . MS (multiple sclerosis) ([place])   . Rhinitis   . Varicose veins   . Superficial phlebitis   . Mood disorder ([place])   . Depressive reaction   . Diverticulitis   . Diverticulosis   . Anemia   . Gastropathy 2013    reactive  . Frequent UTI LAST 7/13  . Blood in stool, frank 12/01/2011    Past Surgical History  Procedure Laterality Date  . Tonsillectomy and adenoidectomy    . Bladder surgery  2008    Tack  . Abdominal hysterectomy  2008  . Shoulder arthroscopy with rotator cuff repair and subacromial decompression  06/01/2012    Procedure: SHOULDER ARTHROSCOPY WITH ROTATOR CUFF REPAIR AND SUBACROMIAL DECOMPRESSION;  Surgeon: Magnus Sinning, MD;  Location: [facility];  Service: Orthopedics;  Laterality: Left;  LEFT SHOULDER ARTHROSCOPY WITH SUBACROMIAL DECOMPRESION AND DEBRIDEMENT OF PARTIAL ROTATOR CUFF TEAR  . Abdominal hysterectomy      There were no vitals filed for this visit.      Subjective Assessment - 01/15/16 0849    Subjective Pt reported she has been having indigestion. Pt reported exercises have been going well.    Pertinent History HTN (per chart), MS, depression,  dizziness   Patient Stated Goals Making it more natural to pick up my feet, improve balance    Currently in Pain? No/denies            [facility] PT Assessment - 01/15/16 0903    Functional Gait  Assessment   Gait assessed  Yes   Gait Level Surface Walks 20 ft in less than 7 sec but greater than 5.5 sec, uses assistive device, slower speed, mild gait deviations, or deviates 6-10 in outside of the 12 in walkway width.   Change in Gait Speed Able to change speed, demonstrates mild gait deviations, deviates 6-10 in outside of the 12 in walkway width, or no gait deviations, unable to achieve a major change in velocity, or uses a change in velocity, or uses an assistive device.   Gait with Horizontal Head Turns Performs head turns smoothly with no change in gait. Deviates no more than 6 in outside 12 in walkway width   Gait with Vertical Head Turns Performs head turns with no change in gait. Deviates no more than 6 in outside 12 in walkway width.   Gait and Pivot Turn Pivot turns safely within 3 sec and stops quickly with no loss of balance.   Step Over Obstacle Is able to step over 2 stacked shoe boxes taped together (9 in total height) without changing gait speed. No evidence of imbalance.   Gait with Narrow  Base of Support Is able to ambulate for 10 steps heel to toe with no staggering.   Gait with Eyes Closed Walks 20 ft, uses assistive device, slower speed, mild gait deviations, deviates 6-10 in outside 12 in walkway width. Ambulates 20 ft in less than 9 sec but greater than 7 sec.  7.7sec.   Ambulating Backwards Walks 20 ft, uses assistive device, slower speed, mild gait deviations, deviates 6-10 in outside 12 in walkway width.   Steps Alternating feet, must use rail.  handrail to descend last 2 steps   Total Score 25                     [facility] Adult PT Treatment/Exercise - 01/15/16 0853    Ambulation/Gait   Ambulation/Gait Yes   Ambulation/Gait Assistance 5: Supervision    Ambulation/Gait Assistance Details Intermittent cues for sequencing, and R heel strike. Pt progressed to self correction.    Ambulation Distance (Feet) 600 Feet  outdoors and 345' indoors all with [facility]   Assistive device Straight cane   Gait Pattern Step-to pattern;Decreased stride length;Decreased hip/knee flexion - right;Narrow base of support;Decreased dorsiflexion - right;Decreased dorsiflexion - left   Ambulation Surface Level;Unlevel;Indoor;Outdoor;Paved   Gait velocity 3.71f/sec with rollator and 3.374fsec without AD   Standardized Balance Assessment   Standardized Balance Assessment Timed Up and Go Test   Timed Up and Go Test   TUG Normal TUG   Normal TUG (seconds) 11.3  no AD   High Level Balance   High Level Balance Activities Negotitating around obstacles;Negotiating over obstacles   High Level Balance Comments No AD pt traversed obstacles (cones and hurdles) with min guard to min A to improve balance. Cues for technique. Performed x3 reps (5 hurdles and 5 cones).                PT Education - 01/15/16 1127    Education provided Yes   Education Details PT discussed d/c next session based on goal progress. Pt agreeable.   Person(s) Educated Patient   Methods Explanation   Comprehension Verbalized understanding          PT Short Term Goals - 12/04/15 1333    PT SHORT TERM GOAL #1   Title same as LTGs           PT Long Term Goals - 01/15/16 1129    PT LONG TERM GOAL #1   Title Pt will verbalize fall prevention strategies to decr. falls risk. Target date: 01/01/16   Baseline All unmet goals will be carried over to new POC: 02/01/16 and will be re-assessed in 2 weeks from 01/04/16   Status On-going   PT LONG TERM GOAL #2   Title Pt will verbalize energy conservation strategies to improve endurance and decr. falls risk. Target date: 01/01/16   Status On-going   PT LONG TERM GOAL #3   Title Pt will improve DGI score to >/=20/24 to decrease falls risk. Target date:  01/01/16   Baseline 17/24 on 12/21/15   Status Achieved   PT LONG TERM GOAL #4   Title Pt will amb. 600' over even/uneven surfaces with LRAD at MOD I level to improve functional mobility. Target date: 01/01/16   Status Achieved   PT LONG TERM GOAL #5   Title Pt will improve FGA score to >/=25/30 to decr. falls risk. Target date: 02/01/16   Status Achieved   PT LONG TERM GOAL #6   Title Pt will perform scooting in bed  in all directions IND to improve bed mobility at home. Target date: 02/01/16   Baseline S for safety and cues for technique.   Status On-going   PT LONG TERM GOAL #7   Title Pt will be IND in progressed HEP to improve strength and balance. Target date: 02/01/16   Status On-going               Plan - 01/15/16 1127    Clinical Impression Statement Pt demonstrated progress, as she met LTG 5 (FGA). Pt's FGA score improved to 25/30, which indicates pt is at a low risk for falls. Pt's TUG time and gait speed all improved from initial eval. PT will assess remaining goals next session and likely d/c.   Rehab Potential Good   Clinical Impairments Affecting Rehab Potential co-morbidities (secondary progressive MS)   PT Frequency 2x / week   PT Duration 4 weeks   PT Treatment/Interventions ADLs/Self Care Home Management;Biofeedback;Canalith Repostioning;Electrical Stimulation;Therapeutic exercise;Balance training;Manual techniques;Vestibular;Therapeutic activities;Orthotic Fit/Training;Functional mobility training;Stair training;Gait training;Patient/family education;DME Instruction;Neuromuscular re-education   PT Next Visit Plan Review HEP and progress as tolerated. Assess goals next session and d/c.   PT Home Exercise Plan Strengthening and balance HEP.   Consulted and Agree with Plan of Care Patient      Patient will benefit from skilled therapeutic intervention in order to improve the following deficits and impairments:  Pain, Decreased coordination, Decreased balance, Decreased  mobility, Impaired tone, Decreased knowledge of use of DME, Decreased strength, Abnormal gait, Decreased endurance  Visit Diagnosis: Other abnormalities of gait and mobility  Muscle weakness (generalized)  Unspecified lack of coordination     Problem List Patient Active Problem List   Diagnosis Date Noted  . Nausea alone 02/13/2014  . Other malaise and fatigue 02/13/2014  . Postmenopausal estrogen deficiency 02/24/2013  . Visit for preventive health examination 02/24/2013  . Family hx osteoporosis 02/21/2013  . Recurrent UTI 01/09/2012  . Suprapubic tenderness 12/01/2011  . GERD (gastroesophageal reflux disease) 10/15/2011  . Dysphagia, pharyngoesophageal 10/15/2011  . Voiding dysfunction 09/24/2011  . Hypertriglyceridemia 02/09/2011  . VITAMIN D DEFICIENCY 12/13/2008  . CONSTIPATION 12/13/2008  . MUSCLE SPASM 12/13/2008  . DIZZINESS 12/13/2008  . HYPOKALEMIA, HX OF 12/13/2008  . DISORDER, ADJUSTMENT W/DEPRESSED MOOD 04/30/2007  . SCLEROSIS, MULTIPLE 04/30/2007  . HYPERTENSION 04/30/2007  . VARICOSE VEIN, LWR EXTREMITIES W/INFLAMMATION 04/30/2007  . VASOMOTOR RHINITIS 04/30/2007    [name] 01/15/2016, 11:30 AM  [place] [address] [place], [name], [address] Phone: [phone]   Fax:  [phone]  Name: [name] MRN: [id number] Date of Birth: 1955/07/30    [name], PT,DPT 01/15/2016 11:30 AM Phone: [phone] Fax: [phone]

## 2016-01-17 DIAGNOSIS — M79642 Pain in left hand: Secondary | ICD-10-CM | POA: Diagnosis not present

## 2016-01-18 ENCOUNTER — Ambulatory Visit: Payer: Medicare Other

## 2016-01-18 DIAGNOSIS — R2689 Other abnormalities of gait and mobility: Secondary | ICD-10-CM | POA: Diagnosis not present

## 2016-01-18 DIAGNOSIS — M6281 Muscle weakness (generalized): Secondary | ICD-10-CM | POA: Diagnosis not present

## 2016-01-18 DIAGNOSIS — R279 Unspecified lack of coordination: Secondary | ICD-10-CM

## 2016-01-18 NOTE — Patient Instructions (Signed)
Bridge    Lie back, legs bent. Press hips up towards ceiling, while tucking tummy in (performing pelvic floor contraction to activate deep ab muscles). Slowly lower back down. Repeat __10__ times. Do __3__ sessions per set. Perform 3 days a week.  [email or web address].exer.[email or web address]   Copyright  VHI. All rights reserved.   Wall-Sit    With back pressed against a wall, bend knees and slide buttocks down until knees are about 45 or more. Stay in this position for _15__ seconds. Squeeze buttocks to stand upright. Repeat _5__ times. Perform 2 sets. Do _3__ times per week.  Copyright  VHI. All rights reserved.   Side Stepping With Semi-Squat    Tie yellow band above knees. Hold onto counter and perform mini squats and side step to the right.  Perform _10__ feet of side stepping right. Perform 10 feet of side stepping left. Perform 4 times, 3 times a week.  Copyright  VHI. All rights reserved.   Forward Lunge    Standing with feet shoulder width apart, step forward into lunge with left leg. Hold __2__ seconds. Repeat __10__ times per session. Do __1__ sessions per day, 3 days a week. Repeat with opposite leg.  Copyright  VHI. All rights reserved.   Walking on Heels    Walk on heels for _10__ feet while continuing on a straight path. Repeat 4 times. Do _1__ sessions per day. Perform 2 times a week.  Copyright  VHI. All rights reserved.  Walking on [employer] onto counter. Walk on toes for __10__ feet while continuing on a straight path. Repeat 4 times.  Do _1___ sessions per day. Perform 2 times per week.  Copyright  VHI. All rights reserved.   Perform at kitchen counter for safety:   Side to Side Head Motion    Perform without assistive device. Walking on solid surface, turn head and eyes to left for __2__ steps. Then, turn head and eyes to opposite side for __2__ steps. Repeat sequence _4___ times per session. Do __1__ sessions per day. Perform 2 times a  week.    Copyright  VHI. All rights reserved.  Up / Down Head Motion    Perform without assistive device. Walking on solid surface, move head and eyes toward ceiling for __2__ steps. Then, move head and eyes toward floor for __2__ steps. Repeat __4__ times per session. Do __1__ sessions per day. Perform 2 times a week.   Copyright  VHI. All rights reserved.  Backward    Walk backwards with eyes open. Take 10 even steps, making sure each foot lifts off floor. Repeat for __4__ times per session. Do __1__ sessions per day. Perform 2-3 times a week.   Copyright  VHI. All rights reserved.  Single Leg - Eyes Open    Hovering hands at counter, lift right leg while maintaining balance over other leg. Perform lifting leg as well. Hold__10__ seconds. Repeat __3__ times per session with each leg. Do __1__ sessions per day. Perform 2-3 times a week.   Copyright  VHI. All rights reserved.      Pec stretch: with both arms up on wall, shift weight forward so you can feel stretch in shoulder/chest. Hold for 30 seconds. Repeat 3 times. 2-3 times per day.    Copyright  VHI. All rights reserved.    Scapular retraction: Perform with red band tied to post, and elbows tucked at your sides. Squeeze shoulder blades towards spine and hold for 2 sec. Repeat 3 sets  of 10 reps. Perform 3 times per week.  Cervical retraction. See handout.

## 2016-01-18 NOTE — Therapy (Signed)
[facility] [address] [place], [name], [address] Phone: [phone]   Fax:  [phone]  Physical Therapy Treatment  Patient Details  Name: [name] MRN: [id number] Date of Birth: Dec 17, 1955 Referring Provider: Dr. Everette Rank  Encounter Date: 01/18/2016      PT End of Session - 01/18/16 0923    Visit Number 9   Number of Visits 9   Date for PT Re-Evaluation 02/02/16   Authorization Type UHC Medicare: G-code every 10th visit.   PT Start Time [phone]   PT Stop Time 0920  d/c   PT Time Calculation (min) 34 min   Equipment Utilized During Treatment --  min guard to S for safety   Activity Tolerance Patient tolerated treatment well   Behavior During Therapy [facility] for tasks assessed/performed      Past Medical History  Diagnosis Date  . MS (multiple sclerosis) ([place])   . Rhinitis   . Varicose veins   . Superficial phlebitis   . Mood disorder ([place])   . Depressive reaction   . Diverticulitis   . Diverticulosis   . Anemia   . Gastropathy 2013    reactive  . Frequent UTI LAST 7/13  . Blood in stool, frank 12/01/2011    Past Surgical History  Procedure Laterality Date  . Tonsillectomy and adenoidectomy    . Bladder surgery  2008    Tack  . Abdominal hysterectomy  2008  . Shoulder arthroscopy with rotator cuff repair and subacromial decompression  06/01/2012    Procedure: SHOULDER ARTHROSCOPY WITH ROTATOR CUFF REPAIR AND SUBACROMIAL DECOMPRESSION;  Surgeon: Magnus Sinning, MD;  Location: [facility];  Service: Orthopedics;  Laterality: Left;  LEFT SHOULDER ARTHROSCOPY WITH SUBACROMIAL DECOMPRESION AND DEBRIDEMENT OF PARTIAL ROTATOR CUFF TEAR  . Abdominal hysterectomy      There were no vitals filed for this visit.      Subjective Assessment - 01/18/16 0850    Subjective Pt denied falls or changes since last visit.    Pertinent History HTN (per chart), MS, depression, dizziness   Patient Stated Goals Making it more natural to pick up my feet, improve balance    Currently in Pain? No/denies            Therex: Pt performed and progressed HEP as tolerated. Performed with S for safety. Please see pt instructions for details. Cues for progression.             [facility] Adult PT Treatment/Exercise - 01/18/16 0850    Transfers   Transfers Supine to Sit;Sit to Supine   Supine to Sit 7: Independent   Sit to Supine 7: Independent                PT Education - 01/18/16 0488    Education provided Yes   Education Details PT reviewed HEP with pt and pt performed and progressed as tolerated. PT discuss goal progess and d/c today. Pt agreeable.   Person(s) Educated Patient   Methods Explanation;Verbal cues;Handout   Comprehension Returned demonstration;Verbalized understanding          PT Short Term Goals - 12/04/15 1333    PT SHORT TERM GOAL #1   Title same as LTGs           PT Long Term Goals - 01/18/16 0924    PT LONG TERM GOAL #1   Title Pt will verbalize fall prevention strategies to decr. falls risk. Target date: 01/01/16   Baseline  All unmet goals will be carried over to new POC: 02/01/16 and will be re-assessed in 2 weeks from 01/04/16   Status Achieved   PT LONG TERM GOAL #2   Title Pt will verbalize energy conservation strategies to improve endurance and decr. falls risk. Target date: 01/01/16   Status Achieved   PT LONG TERM GOAL #3   Title Pt will improve DGI score to >/=20/24 to decrease falls risk. Target date: 01/01/16   Baseline 17/24 on 12/21/15   Status Achieved   PT LONG TERM GOAL #4   Title Pt will amb. 600' over even/uneven surfaces with LRAD at MOD I level to improve functional mobility. Target date: 01/01/16   Status Achieved   PT LONG TERM GOAL #5   Title Pt will improve FGA score to >/=25/30 to decr. falls risk. Target date: 02/01/16   Status Achieved   PT LONG TERM GOAL #6   Title Pt will perform scooting in bed in all  directions IND to improve bed mobility at home. Target date: 02/01/16   Baseline S for safety and cues for technique.   Status Achieved   PT LONG TERM GOAL #7   Title Pt will be IND in progressed HEP to improve strength and balance. Target date: 02/01/16   Status Achieved               Plan - 2016-01-28 7544    Clinical Impression Statement Pt met all LTGs, please d/c summary for details.      Patient will benefit from skilled therapeutic intervention in order to improve the following deficits and impairments:     Visit Diagnosis: Other abnormalities of gait and mobility  Muscle weakness (generalized)  Unspecified lack of coordination       G-Codes - Jan 28, 2016 0924    Functional Assessment Tool Used R SLS: 10 sec. with S; L SLS: 10 sec. with S; gait speed with rollator: 3.70f/sec.; TUG without AD (with min guard): 11.3sec.; DGI: 20/24   Functional Limitation Mobility: Walking and moving around   Mobility: Walking and Moving Around Goal Status [phone] At least 1 percent but less than 20 percent impaired, limited or restricted   Mobility: Walking and Moving Around Discharge Status [phone] At least 1 percent but less than 20 percent impaired, limited or restricted      Problem List Patient Active Problem List   Diagnosis Date Noted  . Nausea alone 02/13/2014  . Other malaise and fatigue 02/13/2014  . Postmenopausal estrogen deficiency 02/24/2013  . Visit for preventive health examination 02/24/2013  . Family hx osteoporosis 02/21/2013  . Recurrent UTI 01/09/2012  . Suprapubic tenderness 12/01/2011  . GERD (gastroesophageal reflux disease) 10/15/2011  . Dysphagia, pharyngoesophageal 10/15/2011  . Voiding dysfunction 09/24/2011  . Hypertriglyceridemia 02/09/2011  . VITAMIN D DEFICIENCY 12/13/2008  . CONSTIPATION 12/13/2008  . MUSCLE SPASM 12/13/2008  . DIZZINESS 12/13/2008  . HYPOKALEMIA, HX OF 12/13/2008  . DISORDER, ADJUSTMENT W/DEPRESSED MOOD 04/30/2007  .  SCLEROSIS, MULTIPLE 04/30/2007  . HYPERTENSION 04/30/2007  . VARICOSE VEIN, LWR EXTREMITIES W/INFLAMMATION 04/30/2007  . VASOMOTOR RHINITIS 04/30/2007    Doralyn Kirkes L 607-09-2015 9:26 AM  [place] [address]Phone: [phone]  Fax:  [phone] Name: KRIYANA BIELMRN: [id number] of Birth: [birth year] PHYSICAL THERAPY DISCHARGE SUMMARY  Visits from Start of Care: 9  Current functional level related to goals / functional outcomes:     PT Long Term  Goals - 01/18/16 0924    PT LONG TERM GOAL #1   Title Pt will verbalize fall prevention strategies to decr. falls risk. Target date: 01/01/16   Baseline All unmet goals will be carried over to new POC: 02/01/16 and will be re-assessed in 2 weeks from 01/04/16   Status Achieved   PT LONG TERM GOAL #2   Title Pt will verbalize energy conservation strategies to improve endurance and decr. falls risk. Target date: 01/01/16   Status Achieved   PT LONG TERM GOAL #3   Title Pt will improve DGI score to >/=20/24 to decrease falls risk. Target date: 01/01/16   Baseline 17/24 on 12/21/15   Status Achieved   PT LONG TERM GOAL #4   Title Pt will amb. 600' over even/uneven surfaces with LRAD at MOD I level to improve functional mobility. Target date: 01/01/16   Status Achieved   PT LONG TERM GOAL #5   Title Pt will improve FGA score to >/=25/30 to decr. falls risk. Target date: 02/01/16   Status Achieved   PT LONG TERM GOAL #6   Title Pt will perform scooting in bed in all directions IND to improve bed mobility at home. Target date: 02/01/16   Baseline S for safety and cues for technique.   Status Achieved   PT LONG TERM GOAL #7   Title Pt will be IND in progressed HEP to improve strength and balance. Target date: 02/01/16   Status Achieved        Remaining deficits: Pt still experiences fatigue 2/2 MS but was able to name energy  conservation strategies during session. Pt also named ways to prevent falls at home.   Education / Equipment: HEP  Plan: Patient agrees to discharge.  Patient goals were met. Patient is being discharged due to meeting the stated rehab goals.  ?????       [name], PT,DPT 01/18/2016 9:26 AM Phone: [phone] Fax: [phone]

## 2016-01-24 DIAGNOSIS — M79642 Pain in left hand: Secondary | ICD-10-CM | POA: Diagnosis not present

## 2016-01-30 DIAGNOSIS — S62657D Nondisplaced fracture of medial phalanx of left little finger, subsequent encounter for fracture with routine healing: Secondary | ICD-10-CM | POA: Diagnosis not present

## 2016-01-31 DIAGNOSIS — M79642 Pain in left hand: Secondary | ICD-10-CM | POA: Diagnosis not present

## 2016-02-08 ENCOUNTER — Other Ambulatory Visit: Payer: Medicare Other

## 2016-02-12 DIAGNOSIS — G35 Multiple sclerosis: Secondary | ICD-10-CM | POA: Diagnosis not present

## 2016-02-12 DIAGNOSIS — Z79899 Other long term (current) drug therapy: Secondary | ICD-10-CM | POA: Diagnosis not present

## 2016-02-12 DIAGNOSIS — G43009 Migraine without aura, not intractable, without status migrainosus: Secondary | ICD-10-CM | POA: Diagnosis not present

## 2016-02-12 DIAGNOSIS — R2689 Other abnormalities of gait and mobility: Secondary | ICD-10-CM | POA: Diagnosis not present

## 2016-02-18 ENCOUNTER — Other Ambulatory Visit: Payer: Medicare Other

## 2016-02-28 ENCOUNTER — Ambulatory Visit (INDEPENDENT_AMBULATORY_CARE_PROVIDER_SITE_OTHER): Payer: Medicare Other

## 2016-02-28 ENCOUNTER — Ambulatory Visit (INDEPENDENT_AMBULATORY_CARE_PROVIDER_SITE_OTHER): Payer: Medicare Other | Admitting: Podiatry

## 2016-02-28 ENCOUNTER — Encounter: Payer: Self-pay | Admitting: Podiatry

## 2016-02-28 VITALS — BP 128/81 | HR 75

## 2016-02-28 DIAGNOSIS — M779 Enthesopathy, unspecified: Secondary | ICD-10-CM | POA: Diagnosis not present

## 2016-02-28 DIAGNOSIS — M216X9 Other acquired deformities of unspecified foot: Secondary | ICD-10-CM

## 2016-02-28 DIAGNOSIS — M79672 Pain in left foot: Secondary | ICD-10-CM

## 2016-02-28 DIAGNOSIS — S62657D Nondisplaced fracture of medial phalanx of left little finger, subsequent encounter for fracture with routine healing: Secondary | ICD-10-CM | POA: Diagnosis not present

## 2016-02-28 DIAGNOSIS — M24542 Contracture, left hand: Secondary | ICD-10-CM | POA: Diagnosis not present

## 2016-02-28 MED ORDER — TRIAMCINOLONE ACETONIDE 10 MG/ML IJ SUSP
10.0000 mg | Freq: Once | INTRAMUSCULAR | Status: AC
  Administered 2016-02-28: 10 mg

## 2016-02-29 NOTE — Progress Notes (Signed)
Subjective:     Patient ID: [name], female   DOB: 01/31/1956, 60 y.o.   MRN: [id number]  HPI patient presents stating she's had pain on top of her left foot and does not remember specific injury   Review of Systems     Objective:   Physical Exam Neurovascular status intact muscle strength adequate range of motion within normal limits with patient found to have discomfort on top of the left foot with inflammation and fluid buildup around the mid tarsal area    Assessment:     Inflammatory tendinitis dorsal left    Plan:     Advised on heat and ice therapy and went ahead did a careful dorsal injection 3 mg Kenalog 5 mg Xylocaine to reduce inflammation and reappoint to recheck

## 2016-03-10 ENCOUNTER — Other Ambulatory Visit (INDEPENDENT_AMBULATORY_CARE_PROVIDER_SITE_OTHER): Payer: Medicare Other

## 2016-03-10 DIAGNOSIS — Z0000 Encounter for general adult medical examination without abnormal findings: Secondary | ICD-10-CM | POA: Diagnosis not present

## 2016-03-10 LAB — CBC WITH DIFFERENTIAL/PLATELET
Basophils Absolute: 0 10*3/uL (ref 0.0–0.1)
Basophils Relative: 0.3 % (ref 0.0–3.0)
EOS PCT: 4 % (ref 0.0–5.0)
Eosinophils Absolute: 0.3 10*3/uL (ref 0.0–0.7)
HCT: 36.1 % (ref 36.0–46.0)
Hemoglobin: 12.5 g/dL (ref 12.0–15.0)
LYMPHS ABS: 2.3 10*3/uL (ref 0.7–4.0)
Lymphocytes Relative: 33.5 % (ref 12.0–46.0)
MCHC: 34.7 g/dL (ref 30.0–36.0)
MCV: 88.9 fl (ref 78.0–100.0)
MONOS PCT: 7.7 % (ref 3.0–12.0)
Monocytes Absolute: 0.5 10*3/uL (ref 0.1–1.0)
NEUTROS ABS: 3.8 10*3/uL (ref 1.4–7.7)
NEUTROS PCT: 54.5 % (ref 43.0–77.0)
Platelets: 228 10*3/uL (ref 150.0–400.0)
RBC: 4.06 Mil/uL (ref 3.87–5.11)
RDW: 13.4 % (ref 11.5–15.5)
WBC: 7 10*3/uL (ref 4.0–10.5)

## 2016-03-10 LAB — HEPATIC FUNCTION PANEL
ALBUMIN: 4.2 g/dL (ref 3.5–5.2)
ALT: 19 U/L (ref 0–35)
AST: 16 U/L (ref 0–37)
Alkaline Phosphatase: 67 U/L (ref 39–117)
Bilirubin, Direct: 0.1 mg/dL (ref 0.0–0.3)
Total Bilirubin: 0.6 mg/dL (ref 0.2–1.2)
Total Protein: 6.4 g/dL (ref 6.0–8.3)

## 2016-03-10 LAB — BASIC METABOLIC PANEL
BUN: 12 mg/dL (ref 6–23)
CHLORIDE: 104 meq/L (ref 96–112)
CO2: 29 meq/L (ref 19–32)
Calcium: 9.2 mg/dL (ref 8.4–10.5)
Creatinine, Ser: 0.74 mg/dL (ref 0.40–1.20)
GFR: 84.56 mL/min (ref >60.00)
GLUCOSE: 87 mg/dL (ref 70–99)
POTASSIUM: 4 meq/L (ref 3.5–5.1)
SODIUM: 140 meq/L (ref 135–145)

## 2016-03-10 LAB — LIPID PANEL
CHOLESTEROL: 172 mg/dL (ref 0–200)
HDL: 50.8 mg/dL (ref >39.00)
LDL Cholesterol: 92 mg/dL (ref 0–99)
NonHDL: 120.9
Total CHOL/HDL Ratio: 3
Triglycerides: 147 mg/dL (ref 0.0–149.0)
VLDL: 29.4 mg/dL (ref 0.0–40.0)

## 2016-03-10 LAB — TSH: TSH: 1.82 u[IU]/mL (ref 0.35–4.50)

## 2016-03-11 DIAGNOSIS — G35 Multiple sclerosis: Secondary | ICD-10-CM | POA: Diagnosis not present

## 2016-03-11 DIAGNOSIS — G43009 Migraine without aura, not intractable, without status migrainosus: Secondary | ICD-10-CM | POA: Diagnosis not present

## 2016-03-11 DIAGNOSIS — Z79899 Other long term (current) drug therapy: Secondary | ICD-10-CM | POA: Diagnosis not present

## 2016-03-13 ENCOUNTER — Other Ambulatory Visit: Payer: Medicare Other

## 2016-03-13 ENCOUNTER — Encounter: Payer: Self-pay | Admitting: Podiatry

## 2016-03-13 ENCOUNTER — Ambulatory Visit (INDEPENDENT_AMBULATORY_CARE_PROVIDER_SITE_OTHER): Payer: Medicare Other | Admitting: Podiatry

## 2016-03-13 ENCOUNTER — Telehealth: Payer: Self-pay | Admitting: Podiatry

## 2016-03-13 DIAGNOSIS — M779 Enthesopathy, unspecified: Secondary | ICD-10-CM

## 2016-03-13 NOTE — Telephone Encounter (Signed)
lvm for pt to call to r/s laser treatment

## 2016-03-13 NOTE — Progress Notes (Signed)
Subjective:     Patient ID: [name], female   DOB: 11-Mar-1956, 60 y.o.   MRN: [id number]  HPI patient presents stating that she still having some pain in her heel but there is been improvement   Review of Systems     Objective:   Physical Exam Neurovascular status intact with tendinitis that's improved but still sore upon deep palpation    Assessment:     Tendinitis present but improved    Plan:     Advised on physical therapy anti-inflammatories and scanned for orthotics to reduce stress against the heel and arch

## 2016-03-14 NOTE — Progress Notes (Signed)
Pre visit review using our clinic review tool, if applicable. No additional management support is needed unless otherwise documented below in the visit note.  Chief Complaint  Patient presents with  . Medicare Wellness    HPI: [name] 60 y.o.  With MS comes in today for Preventive Medicare wellness visit .Since last visit.She has gotten a rolling walker which is really helped her and decrease his risk of falling. She did fall last December a big fall with some leftover numbness on her left forehead and then another fall into a flowerpot at a fractured finger. Since she has had assistance she's not had any falling discontinued her mobility. She is has an AFO or some type of brace for her left foot podiatry which has been very helpful. Had been getting recurrent UTIs but better since she is on suppression with Keflex and occasional self cath. Depression is controlled with medication is being given a small amount of Cymbalta 30 mg and possible transition over to help with pain. MS. To be stable  Tysabri  Health Maintenance  Topic Date Due  . PAP SMEAR  10/10/2014  . MAMMOGRAM  11/26/2014  . INFLUENZA VACCINE  02/26/2016  . Hepatitis C Screening  03/17/2017 (Originally 1956-03-23)  . HIV Screening  03/17/2017 (Originally 04/24/1969)  . COLONOSCOPY  01/09/2025  . TETANUS/TDAP  07/11/2025  . ZOSTAVAX  Completed   Health Maintenance Review LIFESTYLE:  TAD not wine 1-2 no rd  Sugar beverages: hot tea some sugar not much Sleep: 9 calcium vit d  No RD hh of 2 2 cats    MEDICARE DOCUMENT QUESTIONS  TO SCAN     Hearing: ok  Vision:  No limitations at present . Last eye check UTD  Safety:  Has smoke detector and wears seat belts.  No firearms. No excess sun exposure. Sees dentist regularly.  Falls: yes see text Advance directive :  Reviewed  Has one.  Memory: Felt to be good  , no concern from her or her family.  Depression: No anhedonia unusual crying or depressive  symptoms  Nutrition: Eats well balanced diet; adequate calcium and vitamin D. No swallowing chewing problems.  Injury: no major injuries in the last six months.  Other healthcare providers:  Reviewed today .  Social:  Lives with spouse married. No pets.   Preventive parameters: up-to-date  Reviewed   ADLS:   There are no problems or need for assistance  driving, feeding, obtaining food, dressing, toileting and bathing, managing money using phone. She is independent.  EXERCISE/ HABITS  Per week   No tobacco    etoh   ROS:  GEN/ HEENT: No fever, significant weight changes sweats headaches vision problems hearing changes, CV/ PULM; No chest pain shortness of breath cough, syncope,edema  change in exercise tolerance. GI /GU: No adominal pain, vomiting, change in bowel habits. No blood in the stool. No significant GU symptoms. SKIN/HEME: ,no acute skin rashes suspicious lesions or bleeding. No lymphadenopathy, nodules, masses.  NEURO/ PSYCH:  No new neurologic signs. No depression anxiety. IMM/ Allergy: No unusual infections.  Allergy .   REST of 12 system review negative except as per HPI   Past Medical History:  Diagnosis Date  . Anemia   . Blood in stool, frank 12/01/2011  . Depressive reaction   . Diverticulitis   . Diverticulosis   . Frequent UTI LAST 7/13  . 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 Mood disorder ([place])   . MS (multiple sclerosis) (  [facility])   . Rhinitis   . Superficial phlebitis   . Varicose veins     Family History  Problem Relation Age of Onset  . Lung cancer Father   . Colon cancer Neg Hx     Social History   Social History  . Marital status: Married    Spouse name: N/A  . Number of children: 4  . Years of education: N/A   Occupational History  . Disability    Social History Main Topics  . Smoking status: Never Smoker  . Smokeless tobacco: Never Used  . Alcohol use 0.0 oz/week     Comment: occ  . Drug use: No  . Sexual activity: Not Asked    Other Topics Concern  . None   Social History Narrative   Unable to volunteer    Now on disability   Children now out of home   Daily caffeine     Outpatient Encounter Prescriptions as of 03/17/2016  Medication Sig  . Alum Hydroxide-Mag Carbonate (GAVISCON PO) Take 1-3 tablets by mouth daily as needed (stomach acid).   Marland Kitchen buPROPion (WELLBUTRIN XL) 300 MG 24 hr tablet Take 300 mg by mouth daily.  . cephALEXin (KEFLEX) 250 MG capsule Take 250 mg by mouth daily.  . Cholecalciferol (VITAMIN D-3) 5000 UNITS TABS Take 1 tablet by mouth daily.  . clonazePAM (KLONOPIN) 1 MG tablet Take 1 mg by mouth at bedtime.   . DULoxetine (CYMBALTA) 30 MG capsule Take 1 cap daily x 1wk, then 2 caps daily thereafter.  . escitalopram (LEXAPRO) 10 MG tablet Take 10 mg by mouth daily.  Marland Kitchen esomeprazole (NEXIUM) 20 MG capsule Take 20-40 mg by mouth daily as needed (acid reflux).  . Meth-Hyo-M Bl-Na Phos-Ph Sal (URIBEL) 118 MG CAPS Take 1 capsule by mouth daily as needed (bladder spasms). Using PRN  . natalizumab (TYSABRI) 300 MG/15ML injection Inject into the vein every 30 (thirty) days.   . Probiotic Product (PROBIOTIC PEARLS) CAPS Take 1 capsule by mouth daily.  Marland Kitchen terbinafine (LAMISIL) 250 MG tablet Take one tablet once daily x 7 days, then repeat every month x 4 months  . tiZANidine (ZANAFLEX) 4 MG tablet Take 8 mg by mouth at bedtime.   . [DISCONTINUED] baclofen (LIORESAL) 10 MG tablet Take 5 mg by mouth daily. Reported on 12/04/2015  . [DISCONTINUED] ibuprofen (ADVIL,MOTRIN) 600 MG tablet Take 1 tablet (600 mg total) by mouth every 6 (six) hours as needed.   No facility-administered encounter medications on file as of 03/17/2016.     EXAM:  BP 136/82 (BP Location: Left Arm, Patient Position: Sitting, Cuff Size: Normal)   Temp 98.6 F (37 C) (Oral)   Ht 5' 5.5" (1.664 m)   Wt 165 lb 9.6 oz (75.1 kg)   BMI 27.14 kg/m   Body mass index is 27.14 kg/m.  Physical Exam: Vital signs reviewed [id number]  is a well-developed well-nourished alert cooperative   who appears stated age in no acute distress.  HEENT: normocephalic atraumatic , Eyes: PERRL EOM's full, conjunctiva clear, Nares: paten,t no deformity discharge or tenderness., Ears: no deformity EAC's clear TMs with normal landmarks. Mouth: clear OP, no lesions, edema.  Moist mucous membranes. Dentition in adequate repair.glasses  NECK: supple without masses, thyromegaly or bruits. CHEST/PULM:  Clear to auscultation and percussion breath sounds equal no wheeze , rales or rhonchi. No chest wall deformities or tenderness. CV: PMI is nondisplaced, S1 S2 no gallops, murmurs, rubs. Peripheral pulses are full without delay.No JVD .  Breast: normal by inspection . No dimpling, discharge, masses, tenderness or discharge . ABDOMEN: Bowel sounds normal nontender  No guard or rebound, no hepato splenomegal no CVA tenderness.   Extremtities:  No clubbing cyanosis or edema, no acute joint swelling or redness no focal atrophy NEURO:  Oriented x3, cranial nerves 3-12 appear to be intact, no obvious focal weakness, gait supported  Rolling walker   Left foot turns in no spacticity SKIN: No acute rashes normal turgor, color, no bruising or petechiae. PSYCH: Oriented, good eye contact, no obvious depression anxiety, cognition and judgment appear normal. LN: no cervical axillary inguinal adenopathy No noted deficits in memory, attention, and speech.   Lab Results  Component Value Date   WBC 7.0 03/10/2016   HGB 12.5 03/10/2016   HCT 36.1 03/10/2016   PLT 228.0 03/10/2016   GLUCOSE 87 03/10/2016   CHOL 172 03/10/2016   TRIG 147.0 03/10/2016   HDL 50.80 03/10/2016   LDLDIRECT 110.0 04/23/2015   LDLCALC 92 03/10/2016   ALT 19 03/10/2016   AST 16 03/10/2016   NA 140 03/10/2016   K 4.0 03/10/2016   CL 104 03/10/2016   CREATININE 0.74 03/10/2016   BUN 12 03/10/2016   CO2 29 03/10/2016   TSH 1.82 03/10/2016   HGBA1C 5.2 02/13/2014    ASSESSMENT AND  PLAN:  Discussed the following assessment and plan:  Visit for preventive health examination  SCLEROSIS, MULTIPLE - Plan: DG Bone Density  Estrogen deficiency - Plan: DG Bone Density  Hx of falling Pap mammo to get utd Blood work reviewed with patient improved from previous years. Yearly check up wellness labs as appropriate. Rest she will be followed by her specialist neurology GYN and urology. She also goes to [facility] dermatology. Patient Care Team: Burnis Medin, MD as PCP - General Konrad Penta, MD as Referring Physician (Obstetrics and Gynecology) Britt Bottom, MD (Neurology) Carolan Clines, MD as Attending Physician (Urology) [name], MD as Attending Physician (Gastroenterology)  Patient Instructions  Your blood work   Is good  Continue lifestyle intervention healthy eating and exercise . Fall prevention as you are doing.   Get appt for a bone density as we discussed . September would be fine .  Glad you are doing  Well.  Wellness check up in a year or as needed.     Bone Health Bones protect organs, store calcium, and anchor muscles. Good health habits, such as eating nutritious foods and exercising regularly, are important for maintaining healthy bones. They can also help to prevent a condition that causes bones to lose density and become weak and brittle (osteoporosis). WHY IS BONE MASS IMPORTANT? Bone mass refers to the amount of bone tissue that you have. The higher your bone mass, the stronger your bones. An important step toward having healthy bones throughout life is to have strong and dense bones during childhood. A young adult who has a high bone mass is more likely to have a high bone mass later in life. Bone mass at its greatest it is called peak bone mass. A large decline in bone mass occurs in older adults. In women, it occurs about the time of menopause. During this time, it is important to practice good health habits, because if more bone  is lost than what is replaced, the bones will become less healthy and more likely to break (fracture). If you find that you have a low bone mass, you may be able to prevent osteoporosis or further  bone loss by changing your diet and lifestyle. HOW CAN I FIND OUT IF MY BONE MASS IS LOW? Bone mass can be measured with an X-ray test that is called a bone mineral density (BMD) test. This test is recommended for all women who are age 7 or older. It may also be recommended for men who are age 64 or older, or for people who are more likely to develop osteoporosis due to:  Having bones that break easily.  Having a long-term disease that weakens bones, such as kidney disease or rheumatoid arthritis.  Having menopause earlier than normal.  Taking medicine that weakens bones, such as steroids, thyroid hormones, or hormone treatment for breast cancer or prostate cancer.  Smoking.  Drinking three or more alcoholic drinks each day. WHAT ARE THE NUTRITIONAL RECOMMENDATIONS FOR HEALTHY BONES? To have healthy bones, you need to get enough of the right minerals and vitamins. Most nutrition experts recommend getting these nutrients from the foods that you eat. Nutritional recommendations vary from person to person. Ask your health care provider what is healthy for you. Here are some general guidelines. Calcium Recommendations Calcium is the most important (essential) mineral for bone health. Most people can get enough calcium from their diet, but supplements may be recommended for people who are at risk for osteoporosis. Good sources of calcium include:  Dairy products, such as low-fat or nonfat milk, cheese, and yogurt.  Dark green leafy vegetables, such as bok choy and broccoli.  Calcium-fortified foods, such as orange juice, cereal, bread, soy beverages, and tofu products.  Nuts, such as almonds. Follow these recommended amounts for daily calcium intake:  Children, age 30-3: 700 mg.  Children, age  23-8: 1,000 mg.  Children, age 29-13: 1,300 mg.  Teens, age 6-18: 1,300 mg.  Adults, age 61-50: 1,000 mg.  Adults, age 57-70:  Men: 1,000 mg.  Women: 1,200 mg.  Adults, age 51 or older: 1,200 mg.  Pregnant and breastfeeding females:  Teens: 1,300 mg.  Adults: 1,000 mg. Vitamin D Recommendations Vitamin D is the most essential vitamin for bone health. It helps the body to absorb calcium. Sunlight stimulates the skin to make vitamin D, so be sure to get enough sunlight. If you live in a cold climate or you do not get outside often, your health care provider may recommend that you take vitamin D supplements. Good sources of vitamin D in your diet include:  Egg yolks.  Saltwater fish.  Milk and cereal fortified with vitamin D. Follow these recommended amounts for daily vitamin D intake:  Children and teens, age 30-18: 600 international units.  Adults, age 6 or younger: 400-800 international units.  Adults, age 50 or older: 800-1,000 international units. Other Nutrients Other nutrients for bone health include:  Phosphorus. This mineral is found in meat, poultry, dairy foods, nuts, and legumes. The recommended daily intake for adult men and adult women is 700 mg.  Magnesium. This mineral is found in seeds, nuts, dark green vegetables, and legumes. The recommended daily intake for adult men is 400-420 mg. For adult women, it is 310-320 mg.  Vitamin K. This vitamin is found in green leafy vegetables. The recommended daily intake is 120 mg for adult men and 90 mg for adult women. WHAT TYPE OF PHYSICAL ACTIVITY IS BEST FOR BUILDING AND MAINTAINING HEALTHY BONES? Weight-bearing and strength-building activities are important for building and maintaining peak bone mass. Weight-bearing activities cause muscles and bones to work against gravity. Strength-building activities increases muscle strength that supports  bones. Weight-bearing and muscle-building activities include:  Walking  and hiking.  Jogging and running.  Dancing.  Gym exercises.  Lifting weights.  Tennis and racquetball.  Climbing stairs.  Aerobics. Adults should get at least 30 minutes of moderate physical activity on most days. Children should get at least 60 minutes of moderate physical activity on most days. Ask your health care provide what type of exercise is best for you. WHERE CAN I FIND MORE INFORMATION? For more information, check out the following websites:  [place]: YardHomes.se  [employer] of Health: http://www.niams.AnonymousEar.fr.asp   This information is not intended to replace advice given to you by your health care provider. Make sure you discuss any questions you have with your health care provider.   Document Released: 10/04/2003 Document Revised: 11/28/2014 Document Reviewed: 07/19/2014 Elsevier Interactive Patient Education 2016 [place] 6 K. Nester Bachus M.D.

## 2016-03-17 ENCOUNTER — Ambulatory Visit (INDEPENDENT_AMBULATORY_CARE_PROVIDER_SITE_OTHER): Payer: Medicare Other | Admitting: Internal Medicine

## 2016-03-17 ENCOUNTER — Encounter: Payer: Self-pay | Admitting: Internal Medicine

## 2016-03-17 VITALS — BP 136/82 | Temp 98.6°F | Ht 65.5 in | Wt 165.6 lb

## 2016-03-17 DIAGNOSIS — E2839 Other primary ovarian failure: Secondary | ICD-10-CM | POA: Diagnosis not present

## 2016-03-17 DIAGNOSIS — Z9181 History of falling: Secondary | ICD-10-CM

## 2016-03-17 DIAGNOSIS — G35 Multiple sclerosis: Secondary | ICD-10-CM

## 2016-03-17 DIAGNOSIS — Z0000 Encounter for general adult medical examination without abnormal findings: Secondary | ICD-10-CM | POA: Diagnosis not present

## 2016-03-17 NOTE — Patient Instructions (Addendum)
Your blood work   Is good  Continue lifestyle intervention healthy eating and exercise . Fall prevention as you are doing.   Get appt for a bone density as we discussed . September would be fine .  Glad you are doing  Well.  Wellness check up in a year or as needed.     Bone Health Bones protect organs, store calcium, and anchor muscles. Good health habits, such as eating nutritious foods and exercising regularly, are important for maintaining healthy bones. They can also help to prevent a condition that causes bones to lose density and become weak and brittle (osteoporosis). WHY IS BONE MASS IMPORTANT? Bone mass refers to the amount of bone tissue that you have. The higher your bone mass, the stronger your bones. An important step toward having healthy bones throughout life is to have strong and dense bones during childhood. A young adult who has a high bone mass is more likely to have a high bone mass later in life. Bone mass at its greatest it is called peak bone mass. A large decline in bone mass occurs in older adults. In women, it occurs about the time of menopause. During this time, it is important to practice good health habits, because if more bone is lost than what is replaced, the bones will become less healthy and more likely to break (fracture). If you find that you have a low bone mass, you may be able to prevent osteoporosis or further bone loss by changing your diet and lifestyle. HOW CAN I FIND OUT IF MY BONE MASS IS LOW? Bone mass can be measured with an X-ray test that is called a bone mineral density (BMD) test. This test is recommended for all women who are age 73 or older. It may also be recommended for men who are age 30 or older, or for people who are more likely to develop osteoporosis due to:  Having bones that break easily.  Having a long-term disease that weakens bones, such as kidney disease or rheumatoid arthritis.  Having menopause earlier than normal.  Taking  medicine that weakens bones, such as steroids, thyroid hormones, or hormone treatment for breast cancer or prostate cancer.  Smoking.  Drinking three or more alcoholic drinks each day. WHAT ARE THE NUTRITIONAL RECOMMENDATIONS FOR HEALTHY BONES? To have healthy bones, you need to get enough of the right minerals and vitamins. Most nutrition experts recommend getting these nutrients from the foods that you eat. Nutritional recommendations vary from person to person. Ask your health care provider what is healthy for you. Here are some general guidelines. Calcium Recommendations Calcium is the most important (essential) mineral for bone health. Most people can get enough calcium from their diet, but supplements may be recommended for people who are at risk for osteoporosis. Good sources of calcium include:  Dairy products, such as low-fat or nonfat milk, cheese, and yogurt.  Dark green leafy vegetables, such as bok choy and broccoli.  Calcium-fortified foods, such as orange juice, cereal, bread, soy beverages, and tofu products.  Nuts, such as almonds. Follow these recommended amounts for daily calcium intake:  Children, age 31-3: 700 mg.  Children, age 69-8: 1,000 mg.  Children, age 79-13: 1,300 mg.  Teens, age 70-18: 1,300 mg.  Adults, age 690-50: 1,000 mg.  Adults, age 83-70:  Men: 1,000 mg.  Women: 1,200 mg.  Adults, age 319 or older: 1,200 mg.  Pregnant and breastfeeding females:  Teens: 1,300 mg.  Adults: 1,000 mg. Vitamin D  Recommendations Vitamin D is the most essential vitamin for bone health. It helps the body to absorb calcium. Sunlight stimulates the skin to make vitamin D, so be sure to get enough sunlight. If you live in a cold climate or you do not get outside often, your health care provider may recommend that you take vitamin D supplements. Good sources of vitamin D in your diet include:  Egg yolks.  Saltwater fish.  Milk and cereal fortified with vitamin  D. Follow these recommended amounts for daily vitamin D intake:  Children and teens, age 76-18: 600 international units.  Adults, age 33 or younger: 400-800 international units.  Adults, age 57 or older: 800-1,000 international units. Other Nutrients Other nutrients for bone health include:  Phosphorus. This mineral is found in meat, poultry, dairy foods, nuts, and legumes. The recommended daily intake for adult men and adult women is 700 mg.  Magnesium. This mineral is found in seeds, nuts, dark green vegetables, and legumes. The recommended daily intake for adult men is 400-420 mg. For adult women, it is 310-320 mg.  Vitamin K. This vitamin is found in green leafy vegetables. The recommended daily intake is 120 mg for adult men and 90 mg for adult women. WHAT TYPE OF PHYSICAL ACTIVITY IS BEST FOR BUILDING AND MAINTAINING HEALTHY BONES? Weight-bearing and strength-building activities are important for building and maintaining peak bone mass. Weight-bearing activities cause muscles and bones to work against gravity. Strength-building activities increases muscle strength that supports bones. Weight-bearing and muscle-building activities include:  Walking and hiking.  Jogging and running.  Dancing.  Gym exercises.  Lifting weights.  Tennis and racquetball.  Climbing stairs.  Aerobics. Adults should get at least 30 minutes of moderate physical activity on most days. Children should get at least 60 minutes of moderate physical activity on most days. Ask your health care provide what type of exercise is best for you. WHERE CAN I FIND MORE INFORMATION? For more information, check out the following websites:  [place]: [employer].se  [employer] [facility] [employer]: http://www.niams.AnonymousEar.fr.asp   This information is not intended to replace advice given to you by your health care provider. Make sure  you discuss any questions you have with your health care provider.   Document Released: 10/04/2003 Document Revised: 11/28/2014 Document Reviewed: 07/19/2014 Elsevier Interactive Patient Education [employer].

## 2016-03-21 DIAGNOSIS — R339 Retention of urine, unspecified: Secondary | ICD-10-CM | POA: Diagnosis not present

## 2016-04-01 ENCOUNTER — Other Ambulatory Visit: Payer: Medicare Other

## 2016-04-01 ENCOUNTER — Ambulatory Visit (INDEPENDENT_AMBULATORY_CARE_PROVIDER_SITE_OTHER): Payer: Medicare Other

## 2016-04-01 DIAGNOSIS — B351 Tinea unguium: Secondary | ICD-10-CM

## 2016-04-01 NOTE — Patient Instructions (Signed)

## 2016-04-01 NOTE — Progress Notes (Signed)
   Subjective:    Patient ID: [name], female    DOB: 02/12/1956, 60 y.o.   MRN: [id number]  HPI Pt presents with discoloration and slight thickness of nails   Review of Systems    All other systems negative Objective:   Physical Exam   Mycotic nails     Assessment & Plan:  Laser therapy administered to nails 1-5 bilateral.  1000 pulses, all safety precautions in place. Nails debrided prior to procedure. Re-appointed in 1 month for 3rd and final laser treatment

## 2016-04-02 ENCOUNTER — Other Ambulatory Visit: Payer: Medicare Other

## 2016-04-08 DIAGNOSIS — G35 Multiple sclerosis: Secondary | ICD-10-CM | POA: Diagnosis not present

## 2016-04-08 DIAGNOSIS — G43009 Migraine without aura, not intractable, without status migrainosus: Secondary | ICD-10-CM | POA: Diagnosis not present

## 2016-04-08 DIAGNOSIS — Z79899 Other long term (current) drug therapy: Secondary | ICD-10-CM | POA: Diagnosis not present

## 2016-04-16 DIAGNOSIS — N952 Postmenopausal atrophic vaginitis: Secondary | ICD-10-CM | POA: Diagnosis not present

## 2016-04-16 DIAGNOSIS — Z01419 Encounter for gynecological examination (general) (routine) without abnormal findings: Secondary | ICD-10-CM | POA: Diagnosis not present

## 2016-04-16 DIAGNOSIS — Z1231 Encounter for screening mammogram for malignant neoplasm of breast: Secondary | ICD-10-CM | POA: Diagnosis not present

## 2016-04-17 DIAGNOSIS — D2261 Melanocytic nevi of right upper limb, including shoulder: Secondary | ICD-10-CM | POA: Diagnosis not present

## 2016-04-17 DIAGNOSIS — L821 Other seborrheic keratosis: Secondary | ICD-10-CM | POA: Diagnosis not present

## 2016-04-17 DIAGNOSIS — L570 Actinic keratosis: Secondary | ICD-10-CM | POA: Diagnosis not present

## 2016-04-17 DIAGNOSIS — L814 Other melanin hyperpigmentation: Secondary | ICD-10-CM | POA: Diagnosis not present

## 2016-04-23 DIAGNOSIS — N3020 Other chronic cystitis without hematuria: Secondary | ICD-10-CM | POA: Diagnosis not present

## 2016-04-28 ENCOUNTER — Ambulatory Visit (INDEPENDENT_AMBULATORY_CARE_PROVIDER_SITE_OTHER)
Admission: RE | Admit: 2016-04-28 | Discharge: 2016-04-28 | Disposition: A | Discharge status: Home / Self Care | Payer: Medicare Other | Source: Ambulatory Visit | Attending: Internal Medicine | Admitting: Internal Medicine

## 2016-04-28 DIAGNOSIS — E2839 Other primary ovarian failure: Secondary | ICD-10-CM

## 2016-04-28 DIAGNOSIS — G35 Multiple sclerosis: Secondary | ICD-10-CM

## 2016-05-01 ENCOUNTER — Other Ambulatory Visit (INDEPENDENT_AMBULATORY_CARE_PROVIDER_SITE_OTHER): Payer: Medicare Other

## 2016-05-01 DIAGNOSIS — B351 Tinea unguium: Secondary | ICD-10-CM

## 2016-05-05 DIAGNOSIS — R339 Retention of urine, unspecified: Secondary | ICD-10-CM | POA: Diagnosis not present

## 2016-05-06 DIAGNOSIS — G35 Multiple sclerosis: Secondary | ICD-10-CM | POA: Diagnosis not present

## 2016-05-06 DIAGNOSIS — Z79899 Other long term (current) drug therapy: Secondary | ICD-10-CM | POA: Diagnosis not present

## 2016-06-03 DIAGNOSIS — G35 Multiple sclerosis: Secondary | ICD-10-CM | POA: Diagnosis not present

## 2016-06-03 DIAGNOSIS — Z79899 Other long term (current) drug therapy: Secondary | ICD-10-CM | POA: Diagnosis not present

## 2016-06-10 DIAGNOSIS — L570 Actinic keratosis: Secondary | ICD-10-CM | POA: Diagnosis not present

## 2016-06-30 IMAGING — DX DG FOOT COMPLETE 3+V*R*
3 series · 3 of 3 positions shown · non-contrast
Comparison: None.

CLINICAL DATA: Medial right foot pain and swelling beginning
04/02/2015. No known injury. Initial encounter.

EXAM:
RIGHT FOOT COMPLETE - 3+ VIEW

[foot ap]
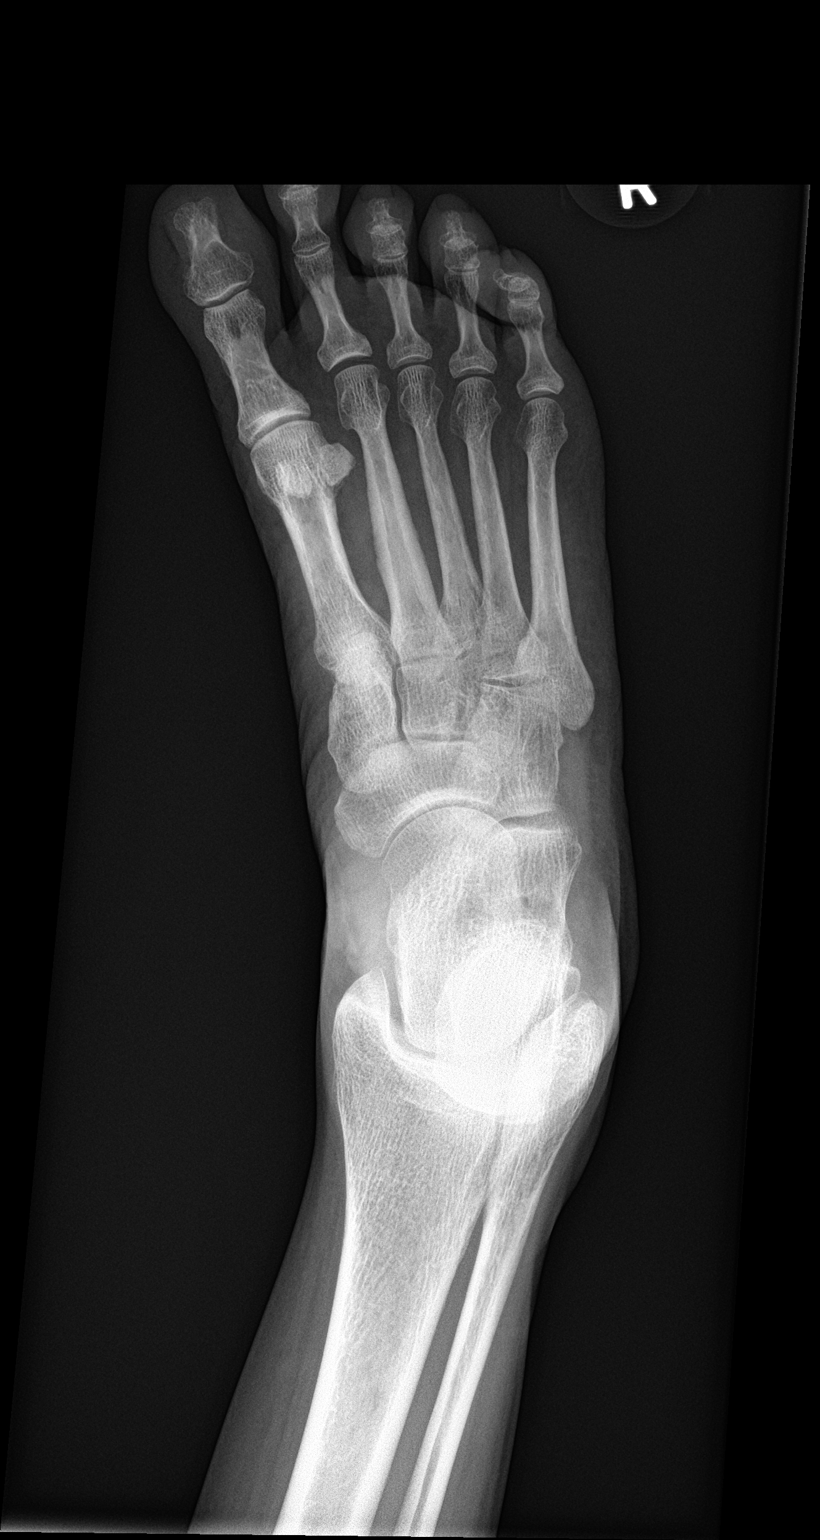

[foot obl]
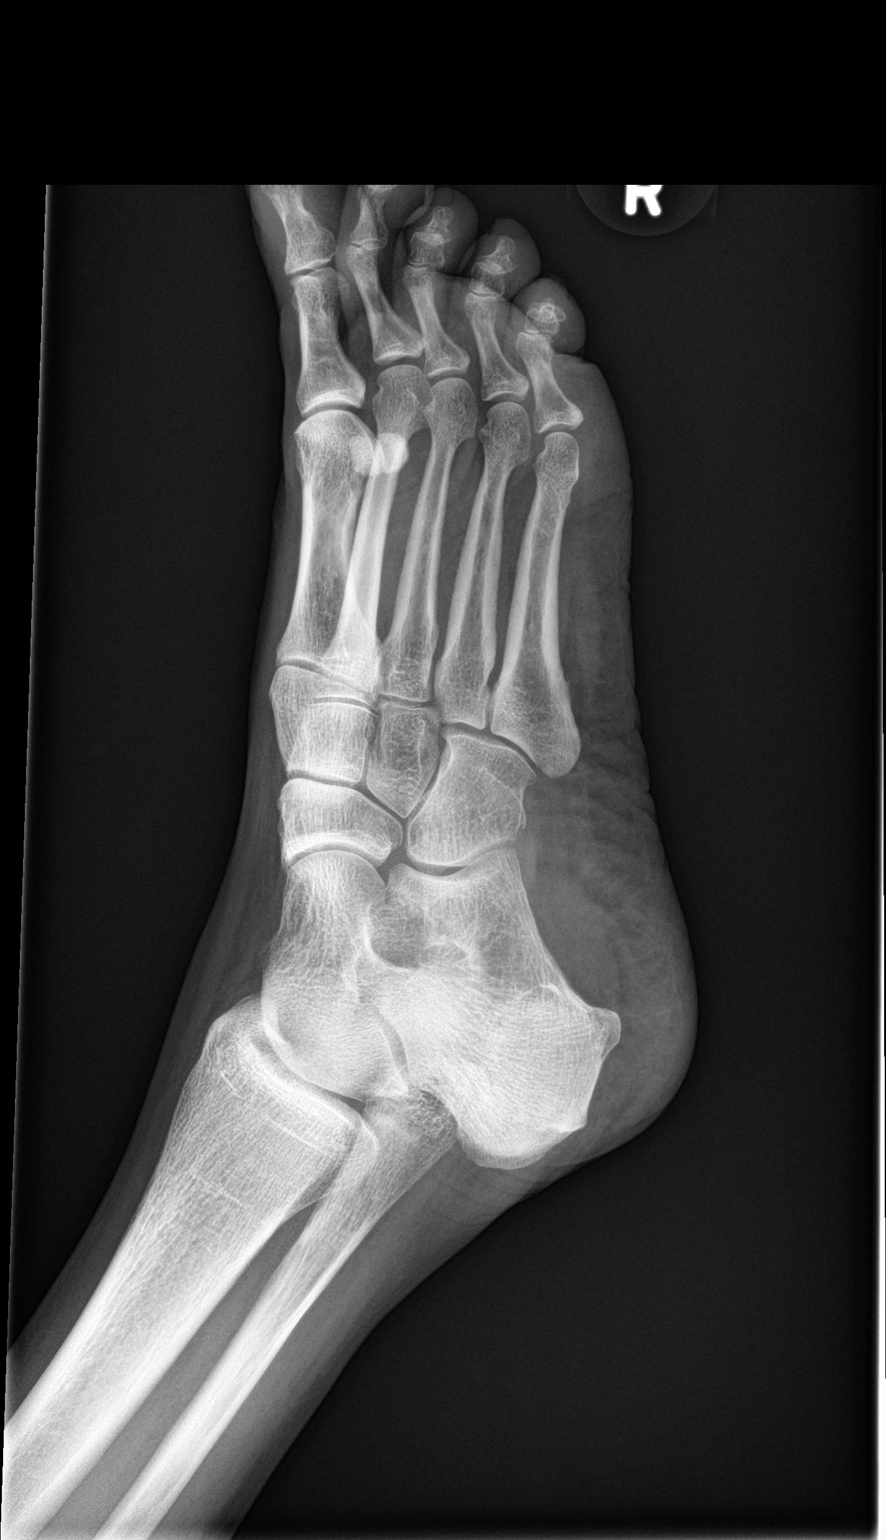

[foot lat]
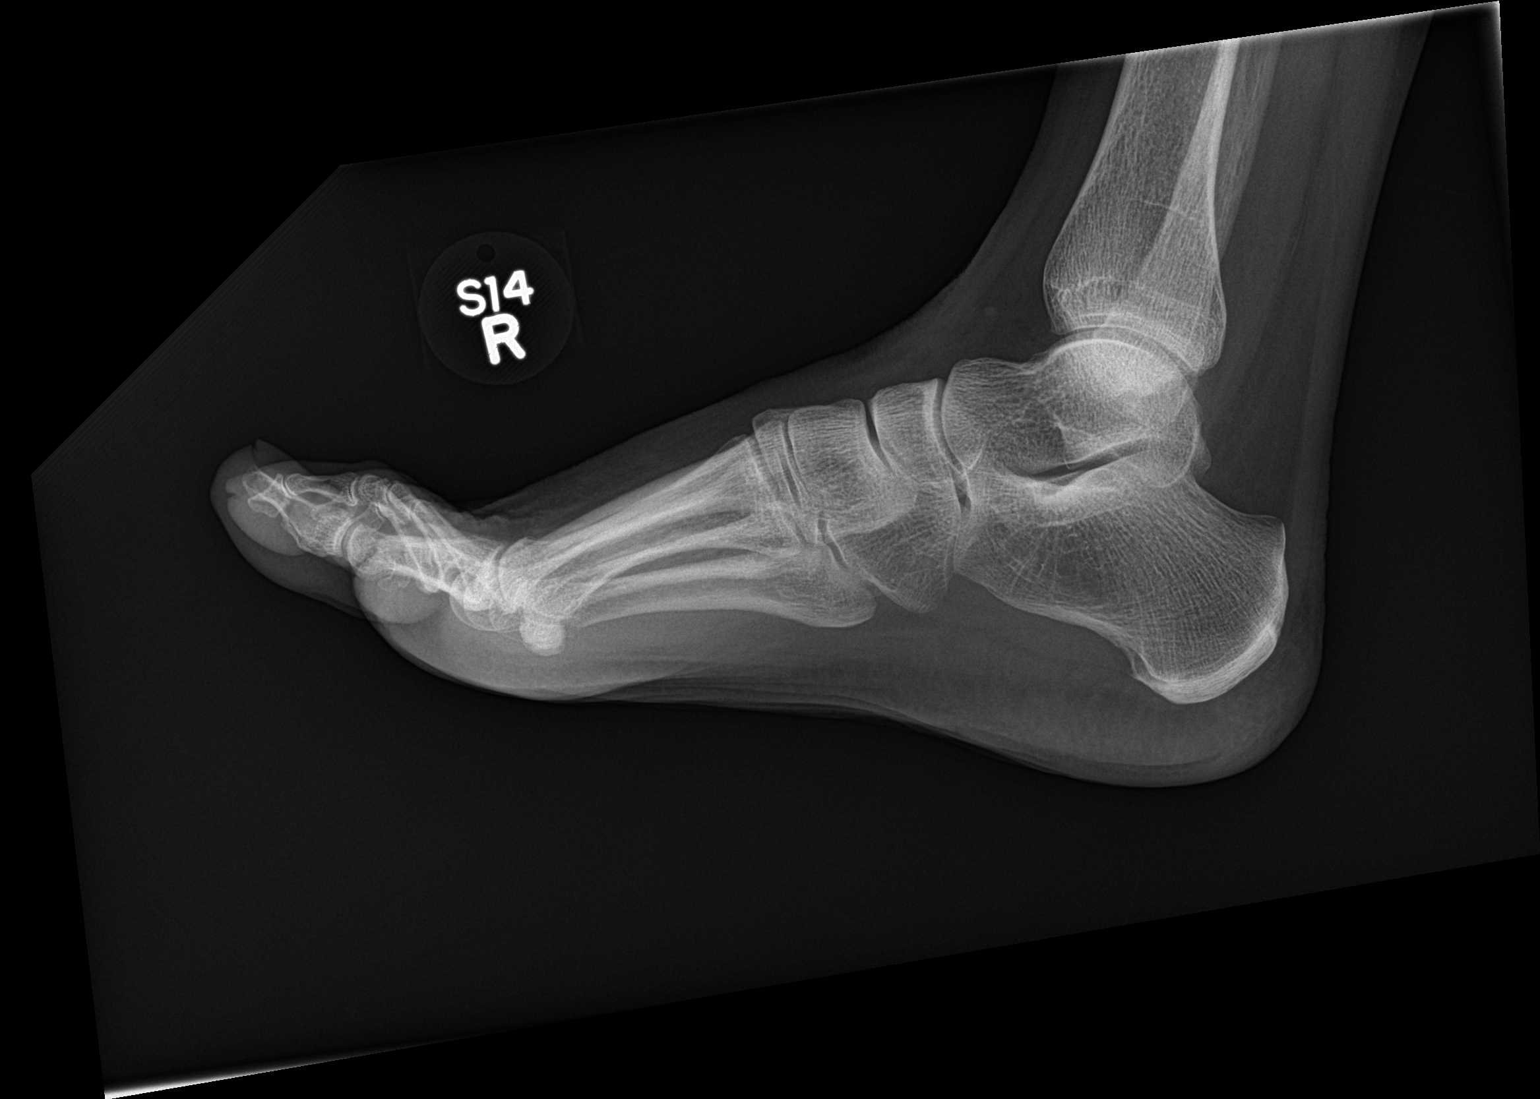

[3 of 3 positions shown; findings below may reference images not displayed]

FINDINGS: Imaged bones, joints and soft tissues appear normal.
IMPRESSION: Negative exam.

## 2016-06-30 IMAGING — CT CT ANGIO CHEST
2 of 6 series · 19 of 36 positions shown · IV contrast (OMNIPAQUE 350)
Comparison: PA and lateral chest earlier today.

CLINICAL DATA: Chest tightness for 2 weeks.  Fatigue.

EXAM:
CT ANGIOGRAPHY CHEST WITH CONTRAST
TECHNIQUE: Multidetector CT imaging of the chest was performed using the
standard protocol during bolus administration of intravenous
contrast. Multiplanar CT image reconstructions and MIPs were
obtained to evaluate the vascular anatomy.
CONTRAST:  100 mL OMNIPAQUE IOHEXOL 350 MG/ML SOLN

[Series 6: coronal mpr · coronal · 0.54mm/px · 1 of 150 slices shown]
[im 75/150  mediastinal]
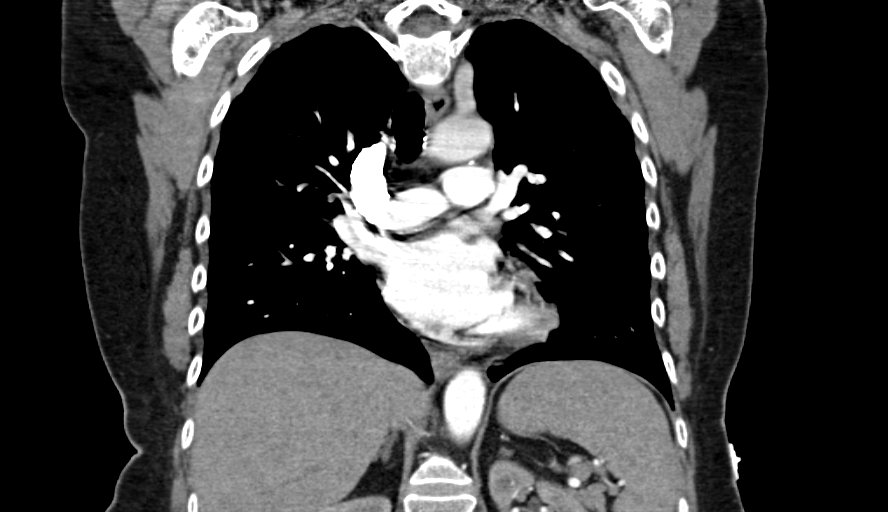

[Series 11: thins for pacs · axial · 0.74mm/px · z∈[+1354,+1601]mm · 18 of 275 slices shown]
[im 14/275  lung]
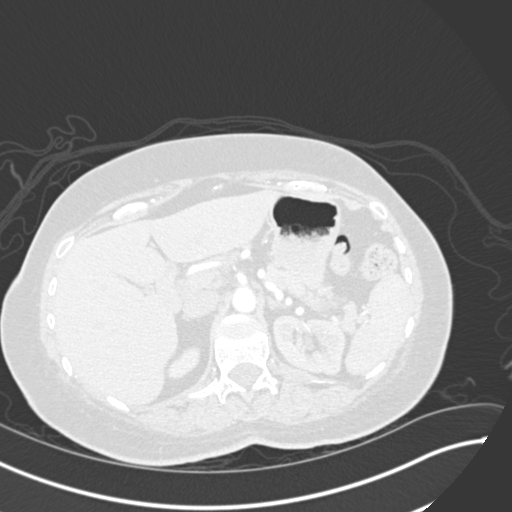
[im 28/275  mediastinal]
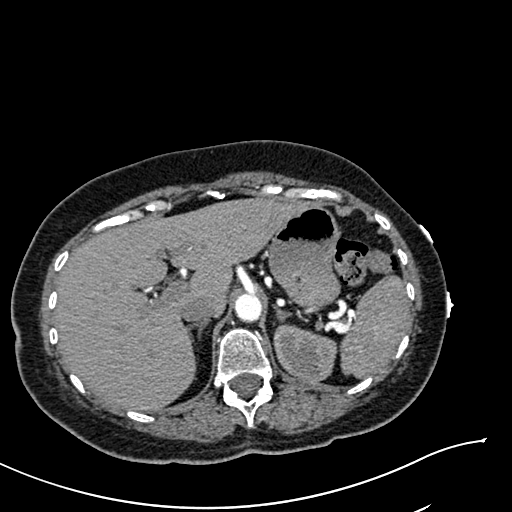
[im 42/275  lung]
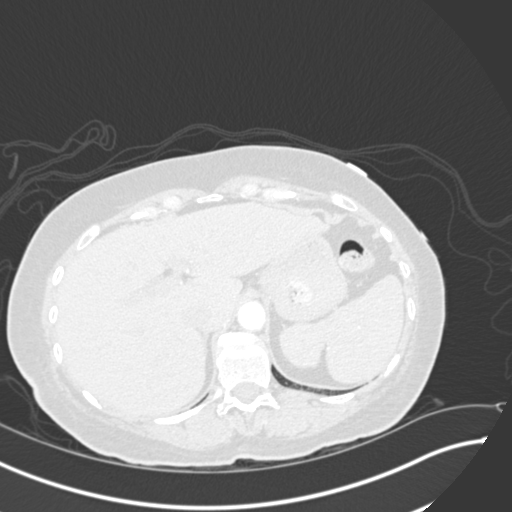
[im 55/275  mediastinal]
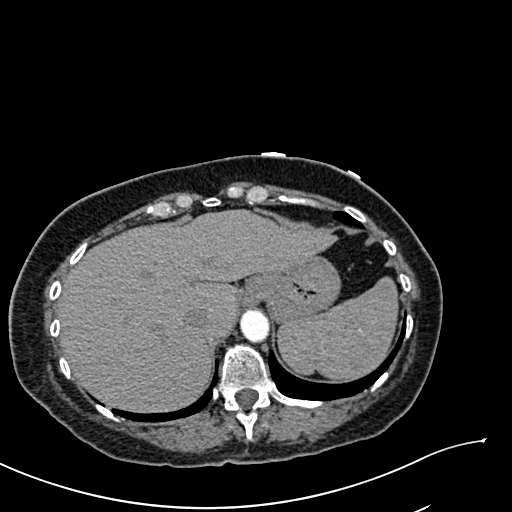
[im 69/275  lung]
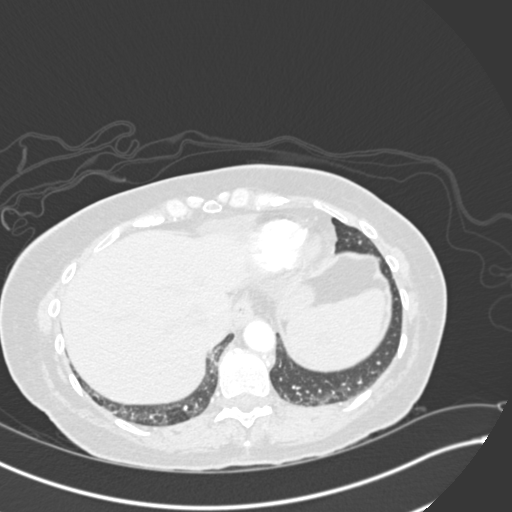
[im 83/275  mediastinal]
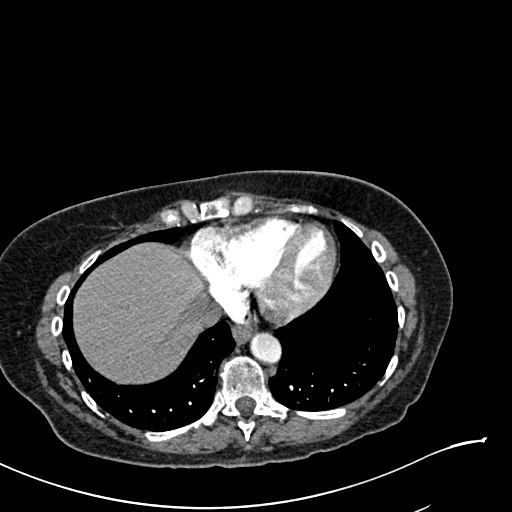
[im 96/275  lung]
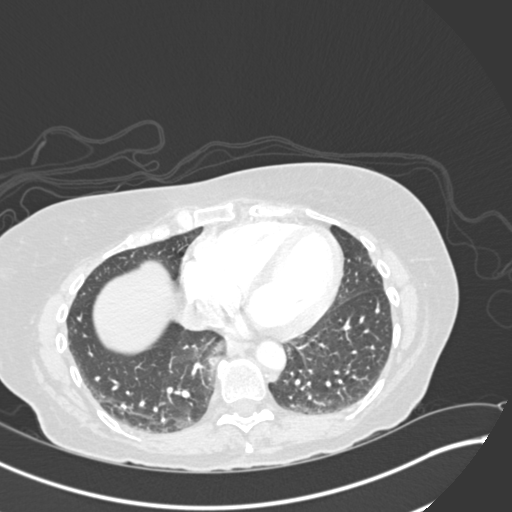
[im 110/275  mediastinal]
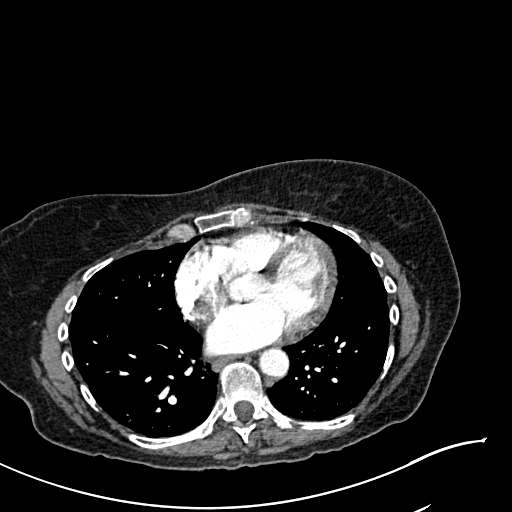
[im 124/275  lung]
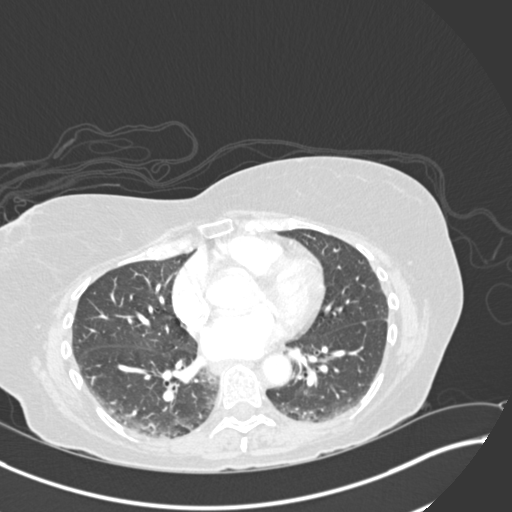
[im 151/275  mediastinal]
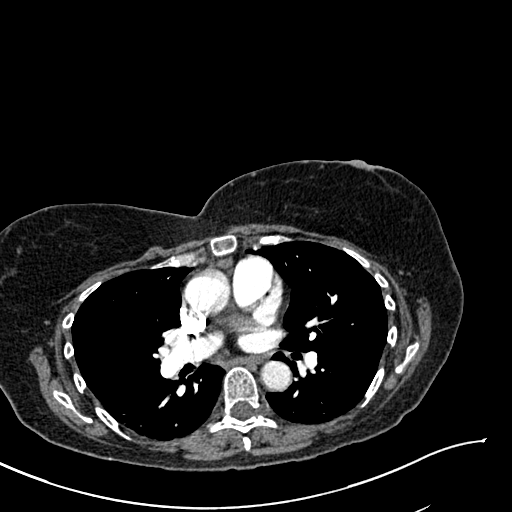
[im 165/275  lung]
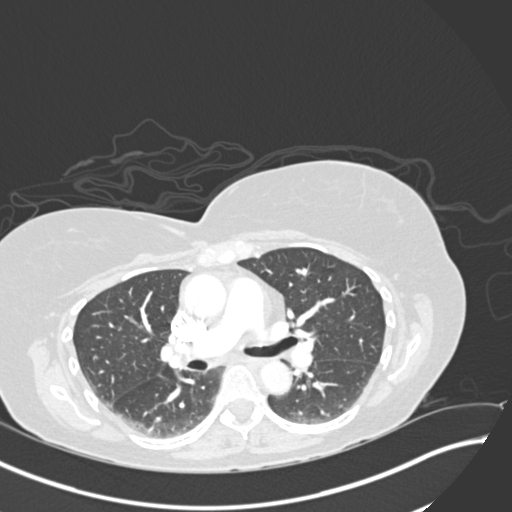
[im 179/275  mediastinal]
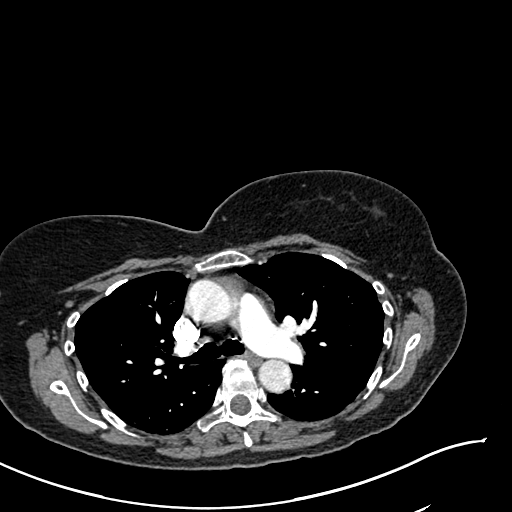
[im 192/275  lung]
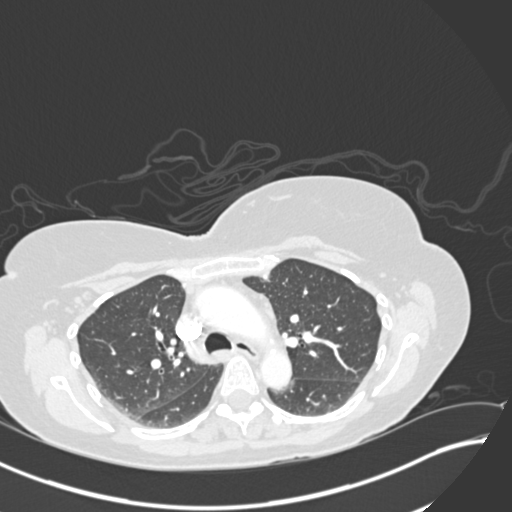
[im 206/275  mediastinal]
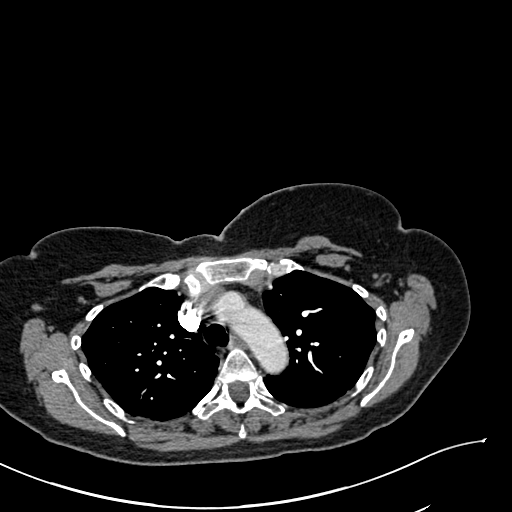
[im 220/275  lung]
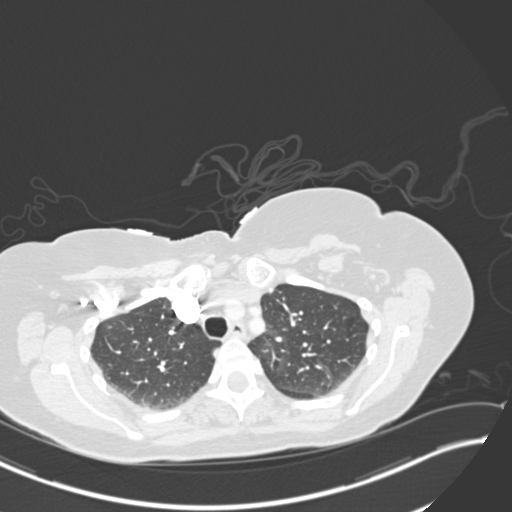
[im 233/275  mediastinal]
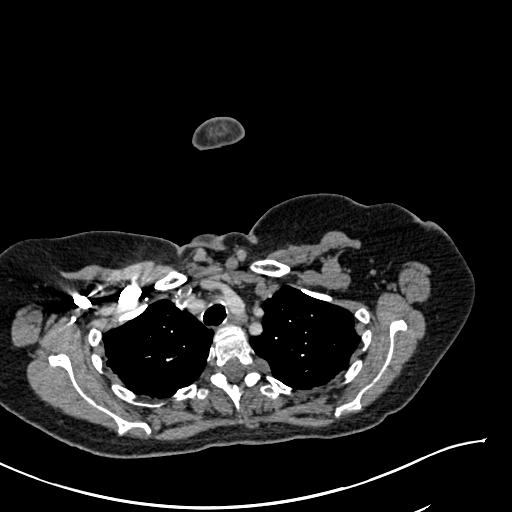
[im 247/275  lung]
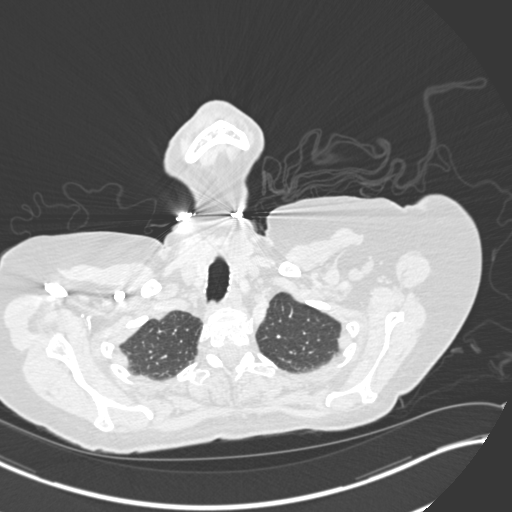
[im 261/275  mediastinal]
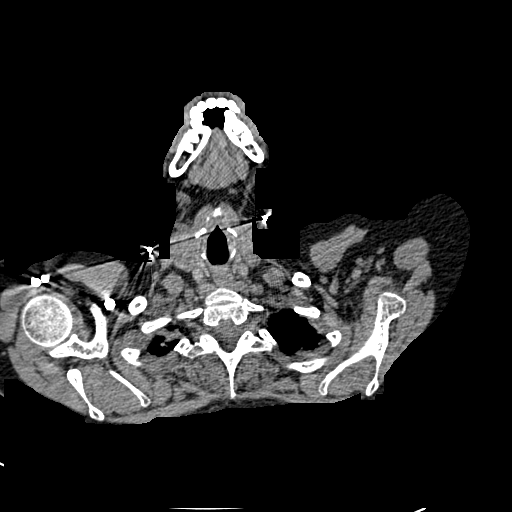

[19 of 36 positions shown; findings below may reference images not displayed]

FINDINGS: No pulmonary embolus is identified. No pleural or pericardial
effusion. No axillary, hilar or mediastinal lymphadenopathy. Mild
aortic atherosclerosis is noted. The patient has a bovine type
aortic arch. The lungs demonstrate only mild dependent atelectasis.
Imaged upper abdomen is unremarkable. No bony abnormality is
identified.

Review of the MIP images confirms the above findings.
IMPRESSION: Negative for pulmonary embolus. No acute disease finding to explain
the patient's symptoms.

## 2016-07-01 DIAGNOSIS — G35 Multiple sclerosis: Secondary | ICD-10-CM | POA: Diagnosis not present

## 2016-07-01 DIAGNOSIS — Z79899 Other long term (current) drug therapy: Secondary | ICD-10-CM | POA: Diagnosis not present

## 2016-07-01 DIAGNOSIS — G43009 Migraine without aura, not intractable, without status migrainosus: Secondary | ICD-10-CM | POA: Diagnosis not present

## 2016-07-14 DIAGNOSIS — R339 Retention of urine, unspecified: Secondary | ICD-10-CM | POA: Diagnosis not present

## 2016-08-18 DIAGNOSIS — G35 Multiple sclerosis: Secondary | ICD-10-CM | POA: Diagnosis not present

## 2016-08-18 DIAGNOSIS — Z79899 Other long term (current) drug therapy: Secondary | ICD-10-CM | POA: Diagnosis not present

## 2016-08-18 DIAGNOSIS — G43009 Migraine without aura, not intractable, without status migrainosus: Secondary | ICD-10-CM | POA: Diagnosis not present

## 2016-08-29 DIAGNOSIS — H5203 Hypermetropia, bilateral: Secondary | ICD-10-CM | POA: Diagnosis not present

## 2016-08-29 DIAGNOSIS — H2513 Age-related nuclear cataract, bilateral: Secondary | ICD-10-CM | POA: Diagnosis not present

## 2016-08-29 DIAGNOSIS — Z79899 Other long term (current) drug therapy: Secondary | ICD-10-CM | POA: Diagnosis not present

## 2016-09-01 DIAGNOSIS — N3020 Other chronic cystitis without hematuria: Secondary | ICD-10-CM | POA: Diagnosis not present

## 2016-09-16 DIAGNOSIS — G35 Multiple sclerosis: Secondary | ICD-10-CM | POA: Diagnosis not present

## 2016-09-16 DIAGNOSIS — Z79899 Other long term (current) drug therapy: Secondary | ICD-10-CM | POA: Diagnosis not present

## 2016-10-15 DIAGNOSIS — G35 Multiple sclerosis: Secondary | ICD-10-CM | POA: Diagnosis not present

## 2016-10-15 DIAGNOSIS — G319 Degenerative disease of nervous system, unspecified: Secondary | ICD-10-CM | POA: Diagnosis not present

## 2016-10-31 ENCOUNTER — Ambulatory Visit (INDEPENDENT_AMBULATORY_CARE_PROVIDER_SITE_OTHER): Payer: Medicare Other | Admitting: Internal Medicine

## 2016-10-31 ENCOUNTER — Encounter: Payer: Self-pay | Admitting: Internal Medicine

## 2016-10-31 VITALS — BP 130/90 | HR 85 | Temp 98.3°F | Ht 65.5 in | Wt 167.0 lb

## 2016-10-31 DIAGNOSIS — J329 Chronic sinusitis, unspecified: Secondary | ICD-10-CM

## 2016-10-31 DIAGNOSIS — G35 Multiple sclerosis: Secondary | ICD-10-CM

## 2016-10-31 DIAGNOSIS — R0982 Postnasal drip: Secondary | ICD-10-CM

## 2016-10-31 DIAGNOSIS — J302 Other seasonal allergic rhinitis: Secondary | ICD-10-CM

## 2016-10-31 MED ORDER — AMOXICILLIN-POT CLAVULANATE 875-125 MG PO TABS: 1.0000 | ORAL_TABLET | Freq: Two times a day (BID) | ORAL | 0 refills | Status: DC

## 2016-10-31 NOTE — Progress Notes (Signed)
Chief Complaint  Patient presents with  . Nasal Congestion  . Sore Throat    HPI: [name] 61 y.o.  sda  She has MS recurrent UTIs on suppressive therapy. Over the last few days has noted increasing nasty nasal drainage without a specific headache or fever but because the weekend is coming up she comes in today. In the past when she has an infection flares up MS quite badly. She hasn't had a sinus infection a while but feels that this one could be coming on. No fever has had allergy symptoms this season. Minor cough no shortness of breath or fever. ROS: See pertinent positives and negatives per HPI.  Past Medical History:  Diagnosis Date  . Anemia   . Blood in stool, frank 12/01/2011  . Depressive reaction   . Diverticulitis   . Diverticulosis   . Frequent UTI LAST 7/13  . 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 Mood disorder ([place])   . MS (multiple sclerosis) ([place])   . Rhinitis   . Superficial phlebitis   . Varicose veins     Family History  Problem Relation Age of Onset  . Lung cancer Father   . Colon cancer Neg Hx     Social History   Social History  . Marital status: Married    Spouse name: N/A  . Number of children: 4  . Years of education: N/A   Occupational History  . Disability    Social History Main Topics  . Smoking status: Never Smoker  . Smokeless tobacco: Never Used  . Alcohol use 0.0 oz/week     Comment: occ  . Drug use: No  . Sexual activity: Not Asked   Other Topics Concern  . None   Social History Narrative   Unable to volunteer    Now on disability   Children now out of home   Daily caffeine     Outpatient Medications Prior to Visit  Medication Sig Dispense Refill  . buPROPion (WELLBUTRIN XL) 300 MG 24 hr tablet Take 300 mg by mouth daily.    . Cholecalciferol (VITAMIN D-3) 5000 UNITS TABS Take 1 tablet by mouth daily.    . clonazePAM (KLONOPIN) 1 MG tablet Take 1 mg by mouth at bedtime.     . DULoxetine (CYMBALTA) 30 MG  capsule Take 1 cap daily x 1wk, then 2 caps daily thereafter.    . esomeprazole (NEXIUM) 20 MG capsule Take 20-40 mg by mouth daily as needed (acid reflux).    . Meth-Hyo-M Bl-Na Phos-Ph Sal (URIBEL) 118 MG CAPS Take 1 capsule by mouth daily as needed (bladder spasms). Using PRN    . natalizumab (TYSABRI) 300 MG/15ML injection Inject into the vein every 30 (thirty) days.     . Probiotic Product (PROBIOTIC PEARLS) CAPS Take 1 capsule by mouth daily.    Marland Kitchen tiZANidine (ZANAFLEX) 4 MG tablet Take 8 mg by mouth at bedtime.     . Alum Hydroxide-Mag Carbonate (GAVISCON PO) Take 1-3 tablets by mouth daily as needed (stomach acid).     . cephALEXin (KEFLEX) 250 MG capsule Take 250 mg by mouth daily.    Marland Kitchen escitalopram (LEXAPRO) 10 MG tablet Take 10 mg by mouth daily.    Marland Kitchen terbinafine (LAMISIL) 250 MG tablet Take one tablet once daily x 7 days, then repeat every month x 4 months 28 tablet 0   No facility-administered medications prior to visit.      EXAM:  BP 130/90 (BP  Location: Left Arm, Patient Position: Sitting, Cuff Size: Normal)   Pulse 85   Temp 98.3 F (36.8 C) (Oral)   Ht 5' 5.5" (1.664 m)   Wt 167 lb (75.8 kg)   BMI 27.37 kg/m   Body mass index is 27.37 kg/m.  GENERAL: vitals reviewed and listed above, alert, oriented, appears well hydrated and in no acute distressAmbulates with a cane is independent. HEENT: atraumatic, conjunctiva  clear, no obvious abnormalities on inspection of external nose and ears congested   OP : no lesion edema or exudate  cobblestoning  NECK: no obvious masses on inspection palpation  LUNGS: clear to auscultation bilaterally, no wheezes, rales or rhonchi,  CV: HRRR, no clubbing cyanosis or  peripheral edema nl cap refill  PSYCH: pleasant and cooperative, no obvious depression or anxiety  ASSESSMENT AND PLAN:  Discussed the following assessment and plan:  Post-nasal drainage - allergic pl;us infectious ? viral vs bact low threshol to intervene with ms  risk of lfares   Seasonal allergic rhinitis, unspecified chronicity, unspecified trigger  Sinusitis, unspecified chronicity, unspecified location  SCLEROSIS, MULTIPLE  -Patient advised to return or notify health care team  if symptoms worsen ,persist or new concerns arise.  Patient Instructions   To help with underlying allergy and inflammation of your sinuses. Begin on nasal cortisone over-the-counter either Flonase or Nasacort over similar 2 sprays on each side of the nose once a day. Also continue saline nose spray to help loosen up the mucus. If progressive and concern about bacterial sinusitis you can add the antibiotic Augmentin standard treatment is one twice a day for 7 days. If this is a virus infection run its course in your cough may get worse before it gets better. That being said if you're getting fever chills shortness of breath contact the health care team.     Sinusitis, Adult Sinusitis is soreness and inflammation of your sinuses. Sinuses are hollow spaces in the bones around your face. Your sinuses are located:  Around your eyes.  In the middle of your forehead.  Behind your nose.  In your cheekbones. Your sinuses and nasal passages are lined with a stringy fluid (mucus). Mucus normally drains out of your sinuses. When your nasal tissues become inflamed or swollen, the mucus can become trapped or blocked so air cannot flow through your sinuses. This allows bacteria, viruses, and funguses to grow, which leads to infection. Sinusitis can develop quickly and last for 7?10 days (acute) or for more than 12 weeks (chronic). Sinusitis often develops after a cold. What are the causes? This condition is caused by anything that creates swelling in the sinuses or stops mucus from draining, including:  Allergies.  Asthma.  Bacterial or viral infection.  Abnormally shaped bones between the nasal passages.  Nasal growths that contain mucus (nasal polyps).  Narrow  sinus openings.  Pollutants, such as chemicals or irritants in the air.  A foreign object stuck in the nose.  A fungal infection. This is rare. What increases the risk? The following factors may make you more likely to develop this condition:  Having allergies or asthma.  Having had a recent cold or respiratory tract infection.  Having structural deformities or blockages in your nose or sinuses.  Having a weak immune system.  Doing a lot of swimming or diving.  Overusing nasal sprays.  Smoking. What are the signs or symptoms? The main symptoms of this condition are pain and a feeling of pressure around the affected sinuses. Other  symptoms include:  Upper toothache.  Earache.  Headache.  Bad breath.  Decreased sense of smell and taste.  A cough that may get worse at night.  Fatigue.  Fever.  Thick drainage from your nose. The drainage is often green and it may contain pus (purulent).  Stuffy nose or congestion.  Postnasal drip. This is when extra mucus collects in the throat or back of the nose.  Swelling and warmth over the affected sinuses.  Sore throat.  Sensitivity to light. How is this diagnosed? This condition is diagnosed based on symptoms, a medical history, and a physical exam. To find out if your condition is acute or chronic, your health care provider may:  Look in your nose for signs of nasal polyps.  Tap over the affected sinus to check for signs of infection.  View the inside of your sinuses using an imaging device that has a light attached (endoscope). If your health care provider suspects that you have chronic sinusitis, you may also:  Be tested for allergies.  Have a sample of mucus taken from your nose (nasal culture) and checked for bacteria.  Have a mucus sample examined to see if your sinusitis is related to an allergy. If your sinusitis does not respond to treatment and it lasts longer than 8 weeks, you may have an MRI or CT  scan to check your sinuses. These scans also help to determine how severe your infection is. In rare cases, a bone biopsy may be done to rule out more serious types of fungal sinus disease. How is this treated? Treatment for sinusitis depends on the cause and whether your condition is chronic or acute. If a virus is causing your sinusitis, your symptoms will go away on their own within 10 days. You may be given medicines to relieve your symptoms, including:  Topical nasal decongestants. They shrink swollen nasal passages and let mucus drain from your sinuses.  Antihistamines. These drugs block inflammation that is triggered by allergies. This can help to ease swelling in your nose and sinuses.  Topical nasal corticosteroids. These are nasal sprays that ease inflammation and swelling in your nose and sinuses.  Nasal saline washes. These rinses can help to get rid of thick mucus in your nose. If your condition is caused by bacteria, you will be given an antibiotic medicine. If your condition is caused by a fungus, you will be given an antifungal medicine. Surgery may be needed to correct underlying conditions, such as narrow nasal passages. Surgery may also be needed to remove polyps. Follow these instructions at home: Medicines   Take, use, or apply over-the-counter and prescription medicines only as told by your health care provider. These may include nasal sprays.  If you were prescribed an antibiotic medicine, take it as told by your health care provider. Do not stop taking the antibiotic even if you start to feel better. Hydrate and Humidify   Drink enough water to keep your urine clear or pale yellow. Staying hydrated will help to thin your mucus.  Use a cool mist humidifier to keep the humidity level in your home above 50%.  Inhale steam for 10-15 minutes, 3-4 times a day or as told by your health care provider. You can do this in the bathroom while a hot shower is running.  Limit  your exposure to cool or dry air. Rest   Rest as much as possible.  Sleep with your head raised (elevated).  Make sure to get enough sleep each  night. General instructions   Apply a warm, moist washcloth to your face 3-4 times a day or as told by your health care provider. This will help with discomfort.  Wash your hands often with soap and water to reduce your exposure to viruses and other germs. If soap and water are not available, use hand sanitizer.  Do not smoke. Avoid being around people who are smoking (secondhand smoke).  Keep all follow-up visits as told by your health care provider. This is important. Contact a health care provider if:  You have a fever.  Your symptoms get worse.  Your symptoms do not improve within 10 days. Get help right away if:  You have a severe headache.  You have persistent vomiting.  You have pain or swelling around your face or eyes.  You have vision problems.  You develop confusion.  Your neck is stiff.  You have trouble breathing. This information is not intended to replace advice given to you by your health care provider. Make sure you discuss any questions you have with your health care provider. Document Released: 07/14/2005 Document Revised: 03/09/2016 Document Reviewed: 05/09/2015 Elsevier Interactive Patient Education  2017 [place] K. Dionte Blaustein M.D.

## 2016-10-31 NOTE — Patient Instructions (Addendum)
To help with underlying allergy and inflammation of your sinuses. Begin on nasal cortisone over-the-counter either Flonase or Nasacort over similar 2 sprays on each side of the nose once a day. Also continue saline nose spray to help loosen up the mucus. If progressive and concern about bacterial sinusitis you can add the antibiotic Augmentin standard treatment is one twice a day for 7 days. If this is a virus infection run its course in your cough may get worse before it gets better. That being said if you're getting fever chills shortness of breath contact the health care team.     Sinusitis, Adult Sinusitis is soreness and inflammation of your sinuses. Sinuses are hollow spaces in the bones around your face. Your sinuses are located:  Around your eyes.  In the middle of your forehead.  Behind your nose.  In your cheekbones. Your sinuses and nasal passages are lined with a stringy fluid (mucus). Mucus normally drains out of your sinuses. When your nasal tissues become inflamed or swollen, the mucus can become trapped or blocked so air cannot flow through your sinuses. This allows bacteria, viruses, and funguses to grow, which leads to infection. Sinusitis can develop quickly and last for 7?10 days (acute) or for more than 12 weeks (chronic). Sinusitis often develops after a cold. What are the causes? This condition is caused by anything that creates swelling in the sinuses or stops mucus from draining, including:  Allergies.  Asthma.  Bacterial or viral infection.  Abnormally shaped bones between the nasal passages.  Nasal growths that contain mucus (nasal polyps).  Narrow sinus openings.  Pollutants, such as chemicals or irritants in the air.  A foreign object stuck in the nose.  A fungal infection. This is rare. What increases the risk? The following factors may make you more likely to develop this condition:  Having allergies or asthma.  Having had a recent cold or  respiratory tract infection.  Having structural deformities or blockages in your nose or sinuses.  Having a weak immune system.  Doing a lot of swimming or diving.  Overusing nasal sprays.  Smoking. What are the signs or symptoms? The main symptoms of this condition are pain and a feeling of pressure around the affected sinuses. Other symptoms include:  Upper toothache.  Earache.  Headache.  Bad breath.  Decreased sense of smell and taste.  A cough that may get worse at night.  Fatigue.  Fever.  Thick drainage from your nose. The drainage is often green and it may contain pus (purulent).  Stuffy nose or congestion.  Postnasal drip. This is when extra mucus collects in the throat or back of the nose.  Swelling and warmth over the affected sinuses.  Sore throat.  Sensitivity to light. How is this diagnosed? This condition is diagnosed based on symptoms, a medical history, and a physical exam. To find out if your condition is acute or chronic, your health care provider may:  Look in your nose for signs of nasal polyps.  Tap over the affected sinus to check for signs of infection.  View the inside of your sinuses using an imaging device that has a light attached (endoscope). If your health care provider suspects that you have chronic sinusitis, you may also:  Be tested for allergies.  Have a sample of mucus taken from your nose (nasal culture) and checked for bacteria.  Have a mucus sample examined to see if your sinusitis is related to an allergy. If your sinusitis does  not respond to treatment and it lasts longer than 8 weeks, you may have an MRI or CT scan to check your sinuses. These scans also help to determine how severe your infection is. In rare cases, a bone biopsy may be done to rule out more serious types of fungal sinus disease. How is this treated? Treatment for sinusitis depends on the cause and whether your condition is chronic or acute. If a  virus is causing your sinusitis, your symptoms will go away on their own within 10 days. You may be given medicines to relieve your symptoms, including:  Topical nasal decongestants. They shrink swollen nasal passages and let mucus drain from your sinuses.  Antihistamines. These drugs block inflammation that is triggered by allergies. This can help to ease swelling in your nose and sinuses.  Topical nasal corticosteroids. These are nasal sprays that ease inflammation and swelling in your nose and sinuses.  Nasal saline washes. These rinses can help to get rid of thick mucus in your nose. If your condition is caused by bacteria, you will be given an antibiotic medicine. If your condition is caused by a fungus, you will be given an antifungal medicine. Surgery may be needed to correct underlying conditions, such as narrow nasal passages. Surgery may also be needed to remove polyps. Follow these instructions at home: Medicines   Take, use, or apply over-the-counter and prescription medicines only as told by your health care provider. These may include nasal sprays.  If you were prescribed an antibiotic medicine, take it as told by your health care provider. Do not stop taking the antibiotic even if you start to feel better. Hydrate and Humidify   Drink enough water to keep your urine clear or pale yellow. Staying hydrated will help to thin your mucus.  Use a cool mist humidifier to keep the humidity level in your home above 50%.  Inhale steam for 10-15 minutes, 3-4 times a day or as told by your health care provider. You can do this in the bathroom while a hot shower is running.  Limit your exposure to cool or dry air. Rest   Rest as much as possible.  Sleep with your head raised (elevated).  Make sure to get enough sleep each night. General instructions   Apply a warm, moist washcloth to your face 3-4 times a day or as told by your health care provider. This will help with  discomfort.  Wash your hands often with soap and water to reduce your exposure to viruses and other germs. If soap and water are not available, use hand sanitizer.  Do not smoke. Avoid being around people who are smoking (secondhand smoke).  Keep all follow-up visits as told by your health care provider. This is important. Contact a health care provider if:  You have a fever.  Your symptoms get worse.  Your symptoms do not improve within 10 days. Get help right away if:  You have a severe headache.  You have persistent vomiting.  You have pain or swelling around your face or eyes.  You have vision problems.  You develop confusion.  Your neck is stiff.  You have trouble breathing. This information is not intended to replace advice given to you by your health care provider. Make sure you discuss any questions you have with your health care provider. Document Released: 07/14/2005 Document Revised: 03/09/2016 Document Reviewed: 05/09/2015 Elsevier Interactive Patient Education  2017 [employer].

## 2016-11-17 DIAGNOSIS — G35 Multiple sclerosis: Secondary | ICD-10-CM | POA: Diagnosis not present

## 2016-12-05 ENCOUNTER — Ambulatory Visit (INDEPENDENT_AMBULATORY_CARE_PROVIDER_SITE_OTHER): Payer: Medicare Other | Admitting: Gastroenterology

## 2016-12-05 ENCOUNTER — Encounter: Payer: Self-pay | Admitting: Gastroenterology

## 2016-12-05 VITALS — BP 140/74 | HR 80 | Ht 65.0 in | Wt 166.6 lb

## 2016-12-05 DIAGNOSIS — K648 Other hemorrhoids: Secondary | ICD-10-CM

## 2016-12-05 DIAGNOSIS — K625 Hemorrhage of anus and rectum: Secondary | ICD-10-CM

## 2016-12-05 DIAGNOSIS — K5900 Constipation, unspecified: Secondary | ICD-10-CM

## 2016-12-05 MED ORDER — HYDROCORTISONE ACETATE 25 MG RE SUPP: 25.0000 mg | Freq: Every day | RECTAL | 2 refills | Status: DC

## 2016-12-05 NOTE — Progress Notes (Addendum)
12/05/2016 [name] [name] [id number] [birth year]   HISTORY OF PRESENT ILLNESS:  This is a pleasant 61 year old female who is previously known to Dr. Olevia Perches.  Her care will be assumed by Dr. Carlean Purl.  She is here today with complaints of rectal bleeding.  She has MS so constipation has been a chronic issues for her.  She has been able to manage this for the most part with diet and consuming a lot of water/fluids.  She still sometimes only goes small amounts and at times it is hard.  She says that she sees bright red blood on the toilet paper with wiping intermittently for the past several weeks.  Also occasionally sees some mucus as well.  Denies abdominal pain or rectal discomfort.  Otherwise is feeling well.   Past Medical History:  Diagnosis Date  . Anemia   . Blood in stool, frank 12/01/2011  . Depressive reaction   . Diverticulitis   . Diverticulosis   . Frequent UTI LAST 7/13  . 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 Mood disorder ([place])   . MS (multiple sclerosis) ([place])   . Rhinitis   . Superficial phlebitis   . Varicose veins    Past Surgical History:  Procedure Laterality Date  . ABDOMINAL HYSTERECTOMY  2008  . BLADDER SURGERY  2008   Tack  . SHOULDER ARTHROSCOPY WITH ROTATOR CUFF REPAIR AND SUBACROMIAL DECOMPRESSION  06/01/2012   Procedure: SHOULDER ARTHROSCOPY WITH ROTATOR CUFF REPAIR AND SUBACROMIAL DECOMPRESSION;  Surgeon: Magnus Sinning, MD;  Location: [facility];  Service: Orthopedics;  Laterality: Left;  LEFT SHOULDER ARTHROSCOPY WITH SUBACROMIAL DECOMPRESION AND DEBRIDEMENT OF PARTIAL ROTATOR CUFF TEAR  . TONSILLECTOMY AND ADENOIDECTOMY      reports that she has never smoked. She has never used smokeless tobacco. She reports that she drinks alcohol. She reports that she does not use drugs. family history includes Lung cancer in her father. Allergies  Allergen Reactions  . Meclizine Hcl     REACTION: Psychotic reaction  . Tramadol Itching  .  Codeine     REACTION: itching  . Hydrocodone Itching  . Omeprazole Magnesium Hives  . Oxycodone Itching  . Prilosec [Omeprazole Magnesium] Hives  . Tramadol Hcl Itching      Outpatient Encounter Prescriptions as of 12/05/2016  Medication Sig  . amoxicillin-clavulanate (AUGMENTIN) 875-125 MG tablet Take 1 tablet by mouth every 12 (twelve) hours.  Marland Kitchen buPROPion (WELLBUTRIN XL) 300 MG 24 hr tablet Take 300 mg by mouth daily.  . Cholecalciferol (VITAMIN D-3) 5000 UNITS TABS Take 1 tablet by mouth daily.  . clonazePAM (KLONOPIN) 1 MG tablet Take 1 mg by mouth at bedtime.   . DULoxetine (CYMBALTA) 30 MG capsule Take 1 cap daily x 1wk, then 2 caps daily thereafter.  . esomeprazole (NEXIUM) 20 MG capsule Take 20-40 mg by mouth daily as needed (acid reflux).  . Meth-Hyo-M Bl-Na Phos-Ph Sal (URIBEL) 118 MG CAPS Take 1 capsule by mouth daily as needed (bladder spasms). Using PRN  . natalizumab (TYSABRI) 300 MG/15ML injection Inject into the vein every 30 (thirty) days.   . Probiotic Product (PROBIOTIC PEARLS) CAPS Take 1 capsule by mouth daily.  Marland Kitchen tiZANidine (ZANAFLEX) 4 MG tablet Take 8 mg by mouth at bedtime.    No facility-administered encounter medications on file as of 12/05/2016.      REVIEW OF SYSTEMS  : All other systems reviewed and negative except where noted in the History of Present Illness.  PHYSICAL EXAM: BP 140/74   Pulse 80   Ht 5\' 5"  (1.651 m)   Wt 166 lb 9.6 oz (75.6 kg)   BMI 27.72 kg/m  General: Well developed white female in no acute distress Head: Normocephalic and atraumatic Eyes:  Sclerae anicteric, conjunctiva pink. Ears: Normal auditory acuity Lungs: Clear throughout to auscultation Heart: Regular rate and rhythm Abdomen: Soft, non-distended.  Normal bowel sounds.  Non-tender. Rectal:  No external abnormalities noted.  DRE did not reveal any masses.  Small amount of light brown stool on exam glove was negative for occult blood.  Anoscopy revealed internal  hemorrhoids, one posteriorly was particularly large and inflamed.  No active bleeding seen. Musculoskeletal: Symmetrical with no gross deformities  Skin: No lesions on visible extremities Extremities: No edema  Neurological: Alert oriented x 4, grossly non-focal Psychological:  Alert and cooperative. Normal mood and affect  ASSESSMENT AND PLAN: -Rectal bleeding:  Some mucus also.  Suspect hemorrhoidal source as internal hemorrhoids were seen on exam today.  Last colonoscopy 12/2014 unremarkable.  Will treat with anusol suppositories at bedtime for now. -Chronic constipation:  Prefers to manage this with diet for now, which she thinks works well for the most part.     CC:  [name], [name], MD  Agree with Ms. Mattilyn Crites's management.  Gatha Mayer, MD, [facility]

## 2016-12-09 DIAGNOSIS — L570 Actinic keratosis: Secondary | ICD-10-CM | POA: Diagnosis not present

## 2016-12-09 DIAGNOSIS — L821 Other seborrheic keratosis: Secondary | ICD-10-CM | POA: Diagnosis not present

## 2016-12-09 DIAGNOSIS — D2239 Melanocytic nevi of other parts of face: Secondary | ICD-10-CM | POA: Diagnosis not present

## 2016-12-17 DIAGNOSIS — G43009 Migraine without aura, not intractable, without status migrainosus: Secondary | ICD-10-CM | POA: Diagnosis not present

## 2016-12-17 DIAGNOSIS — Z79899 Other long term (current) drug therapy: Secondary | ICD-10-CM | POA: Diagnosis not present

## 2016-12-17 DIAGNOSIS — G35 Multiple sclerosis: Secondary | ICD-10-CM | POA: Diagnosis not present

## 2016-12-24 DIAGNOSIS — R339 Retention of urine, unspecified: Secondary | ICD-10-CM | POA: Diagnosis not present

## 2017-01-14 DIAGNOSIS — G35 Multiple sclerosis: Secondary | ICD-10-CM | POA: Diagnosis not present

## 2017-01-15 ENCOUNTER — Encounter: Payer: Self-pay | Admitting: Family Medicine

## 2017-01-15 ENCOUNTER — Ambulatory Visit (INDEPENDENT_AMBULATORY_CARE_PROVIDER_SITE_OTHER): Payer: Medicare Other | Admitting: Family Medicine

## 2017-01-15 VITALS — BP 138/86 | HR 80 | Temp 98.5°F | Wt 167.8 lb

## 2017-01-15 DIAGNOSIS — R21 Rash and other nonspecific skin eruption: Secondary | ICD-10-CM

## 2017-01-15 DIAGNOSIS — G35 Multiple sclerosis: Secondary | ICD-10-CM

## 2017-01-15 NOTE — Patient Instructions (Signed)
It was a pleasure to see you today! Please use Benedryl cream and you can add either Allegra, Claritin, or Zyrtec.   Diphenhydramine Topical What is this medicine? DIPHENHYDRAMINE (dye fen HYE dra meen) is an antihistamine. It is used on the skin to treat pain and itching from insect bites, sunburns, rashes, and other minor skin conditions. This medicine may be used for other purposes; ask your health care provider or pharmacist if you have questions. COMMON BRAND NAME(S): Banophen Anti-itch, Banophen Anti-Itch Extra Strength, Benadryl, Benadryl Itch Stopping, Benadryl ReadyMist Itch Stopping, Dermarest Insect Bite, Dermarest Plus, Diphenhist with Zinc Acetate, Itch Relief What should I tell my health care provider before I take this medicine? They need to know if you have any of these conditions: -chicken pox or measles -an unusual or allergic reaction to diphenhydramine, other medicines, foods, dyes, or preservatives -pregnant or trying to get pregnant -breast-feeding How should I use this medicine? This medicine is for external use only. Do not drink or use in the mouth. Follow the directions on the package. Wash your hands before and after use. Apply to the affected area as directed on the package or by your doctor or health care professional. Do not use this medicine more often than directed. Talk to your pediatrician regarding the use of this medicine in children. While this drug may be used in children as young as 2 years for selected conditions, precautions do apply. Overdosage: If you think you have taken too much of this medicine contact a poison control center or emergency room at once. NOTE: This medicine is only for you. Do not share this medicine with others. What if I miss a dose? If you miss a dose, use it as soon as you can. If it is almost time for your next dose, use only that dose. Do not use double or extra doses. What may interact with this medicine? Interactions are not  expected. Do not use any other skin products on the affected area without asking your doctor or health care professional. This list may not describe all possible interactions. Give your health care provider a list of all the medicines, herbs, non-prescription drugs, or dietary supplements you use. Also tell them if you smoke, drink alcohol, or use illegal drugs. Some items may interact with your medicine. What should I watch for while using this medicine? Tell your doctor or healthcare professional if your symptoms do not start to get better within 7 days or if they get worse. You may have a skin infection or other more serious skin condition. Do not use this medicine on large areas of the body or with other products that contain diphenhydramine. To do so may increase the risk of side effects. Do not get this medicine in your eyes. If you do, rinse with plenty of water. What side effects may I notice from receiving this medicine? Side effects that you should report to your doctor or health care professional as soon as possible: -allergic reactions like skin rash, itching or hives, swelling of the face, lips, or tongue Side effects that usually do not require medical attention (report to your doctor or health care professional if they continue or are bothersome): -drowsiness or dizziness -dry mouth -headache This list may not describe all possible side effects. Call your doctor for medical advice about side effects. You may report side effects to FDA at 1-800-FDA-1088. Where should I keep my medicine? Keep out of the reach of children. Store at room temperature between  20 and 25 degrees C (68 and 77 degrees F). Do not freeze. Protect from light. Throw away any unused medicine after the expiration date. Some products may contain alcohol or have other flammable components. Do not use while smoking or near heat or flame. NOTE: This sheet is a summary. It may not cover all possible information. If you  have questions about this medicine, talk to your doctor, pharmacist, or health care provider.  2018 Elsevier/Gold Standard (2008-01-31 17:30:40)

## 2017-01-15 NOTE — Progress Notes (Signed)
Patient ID: [name], female   DOB: [birth year], 61 y.o.   MRN: [id number]   PCP: Margaret Medin, MD  Subjective:  [name] is a 61 y.o. year old very pleasant female patient who presents with a Rash: Initial distribution: Neck, individual spots Prior history of this rash: No Discomfort associated: Pruritus Associated symptoms:  None Change in rash:  None Denies:  Fever, chills, sweats, myalgias, difficulty swallowing, hoarseness, SOB, N/V Abdominal pain, or tightening throat. No new exposures of soaps, lotions, laundry detergent, fabric softeners, foods, medications, herbal supplements, STDs, plants, animals, or insects. Dr. Everette Montgomery is her neurologist and she contacted him regarding this concern who recommended Benedryl   -started: 4 days ago after watering plants outside, symptoms are not worsening -previous treatments: Cortisone cream that provided limited benefit and she avoids due to history of MS -sick contacts/travel/risks: denies flu exposure.  No recent sick contact exposure -Hx of: allergies  ROS-denies fever, SOB, NVD,   Pertinent Past Medical History- MS  Medications- reviewed  Current Outpatient Prescriptions  Medication Sig Dispense Refill  . buPROPion (WELLBUTRIN XL) 300 MG 24 hr tablet Take 300 mg by mouth daily.    . Cholecalciferol (VITAMIN D-3) 5000 UNITS TABS Take 1 tablet by mouth daily.    . clonazePAM (KLONOPIN) 1 MG tablet Take 1 mg by mouth at bedtime.     . DULoxetine (CYMBALTA) 30 MG capsule Take 1 cap daily x 1wk, then 2 caps daily thereafter.    . esomeprazole (NEXIUM) 20 MG capsule Take 20-40 mg by mouth daily as needed (acid reflux).    Marland Kitchen etodolac (LODINE) 200 MG capsule Take 200 mg by mouth daily.    . hydrocortisone (ANUSOL-HC) 25 MG suppository Place 1 suppository (25 mg total) rectally at bedtime. 14 suppository 2  . Meth-Hyo-M Bl-Na Phos-Ph Sal (URIBEL) 118 MG CAPS Take 1 capsule by mouth daily as needed (bladder spasms).  Using PRN    . natalizumab (TYSABRI) 300 MG/15ML injection Inject into the vein every 30 (thirty) days.     . Probiotic Product (PROBIOTIC PEARLS) CAPS Take 1 capsule by mouth daily.    Marland Kitchen tiZANidine (ZANAFLEX) 4 MG tablet Take 8 mg by mouth at bedtime.      No current facility-administered medications for this visit.     Objective: BP 138/86 (BP Location: Left Arm, Patient Position: Sitting, Cuff Size: Normal)   Pulse 80   Temp 98.5 F (36.9 C) (Oral)   Wt 167 lb 12.8 oz (76.1 kg)   SpO2 98%   BMI 27.92 kg/m  Gen: NAD, resting comfortably HEENT: Oropharynx is clear and moist CV: RRR no murmurs rubs or gallops Lungs: CTAB no crackles, wheeze, rhonchi Abdomen: soft/nontender/nondistended/normal bowel sounds. No rebound or guarding.  Ext: no edema Skin: warm, dry, Five mildly erythematous, raised papule with central punctate resembling mosquito bites. No warmth, drainage, or fluctuance noted.   Neuro: grossly normal, moves all extremities  Assessment/Plan:  1.  Rash History and exam today are suggestive of insect bites that most likely occurred with watering plants outside. Symptomatic treatment with: topical diphenhydramine cream and advised adding either Allegra, Claritin, or Zyrtec if needed. Finally, we reviewed reasons to return to care including if symptoms worsen or persist or new concerns arise- once again particularly rash that does not improve,  shortness of breath, N/V, or fever.   Reviewed importance of calling 911immediately if symptoms of SOB, difficulty swallowing, or throat tightening.   2. SCLEROSIS, MULTIPLE Reinforced importance of  updating neurology for any changes or new medications that she takes. She has spoken with her provider about this who recommended benedryl topical also.  Margaret Metz, FNP-C   Margaret Quint, FNP

## 2017-02-04 ENCOUNTER — Telehealth: Payer: Self-pay | Admitting: Podiatry

## 2017-02-04 NOTE — Telephone Encounter (Signed)
Dr. Paulla Dolly has been treating me for toenail fungus and I'm still having trouble with my right great toenail. I was wondering if I could get a refill on the oral Rx.

## 2017-02-05 NOTE — Telephone Encounter (Signed)
Left message encouraging pt to make an appt to be reevaluated if she has not seen good evidence of healthy outgrow in 6-9 months and if having pain may need to be evaluated for an ingrown toenail.

## 2017-02-10 DIAGNOSIS — G35 Multiple sclerosis: Secondary | ICD-10-CM | POA: Diagnosis not present

## 2017-02-16 DIAGNOSIS — N760 Acute vaginitis: Secondary | ICD-10-CM | POA: Diagnosis not present

## 2017-02-16 DIAGNOSIS — B9689 Other specified bacterial agents as the cause of diseases classified elsewhere: Secondary | ICD-10-CM | POA: Diagnosis not present

## 2017-03-04 ENCOUNTER — Ambulatory Visit (INDEPENDENT_AMBULATORY_CARE_PROVIDER_SITE_OTHER): Payer: Medicare Other | Admitting: Podiatry

## 2017-03-04 ENCOUNTER — Encounter: Payer: Self-pay | Admitting: Podiatry

## 2017-03-04 VITALS — BP 146/82 | HR 81 | Resp 16

## 2017-03-04 DIAGNOSIS — M19079 Primary osteoarthritis, unspecified ankle and foot: Secondary | ICD-10-CM | POA: Diagnosis not present

## 2017-03-04 DIAGNOSIS — B351 Tinea unguium: Secondary | ICD-10-CM

## 2017-03-04 DIAGNOSIS — M722 Plantar fascial fibromatosis: Secondary | ICD-10-CM | POA: Diagnosis not present

## 2017-03-04 DIAGNOSIS — M79672 Pain in left foot: Secondary | ICD-10-CM

## 2017-03-04 DIAGNOSIS — Q6672 Congenital pes cavus, left foot: Secondary | ICD-10-CM

## 2017-03-04 LAB — HEPATIC FUNCTION PANEL
ALK PHOS: 71 U/L (ref 33–130)
ALT: 23 U/L (ref 6–29)
AST: 19 U/L (ref 10–35)
Albumin: 4.4 g/dL (ref 3.6–5.1)
BILIRUBIN INDIRECT: 0.7 mg/dL (ref 0.2–1.2)
Bilirubin, Direct: 0.2 mg/dL (ref <=0.2)
TOTAL PROTEIN: 6.4 g/dL (ref 6.1–8.1)
Total Bilirubin: 0.9 mg/dL (ref 0.2–1.2)

## 2017-03-04 MED ORDER — TERBINAFINE HCL 250 MG PO TABS: 250.0000 mg | ORAL_TABLET | Freq: Every day | ORAL | 0 refills | Status: DC

## 2017-03-04 MED ORDER — MELOXICAM 15 MG PO TABS: 15.0000 mg | ORAL_TABLET | Freq: Every day | ORAL | 0 refills | Status: DC

## 2017-03-04 MED ORDER — TRIAMCINOLONE ACETONIDE 10 MG/ML IJ SUSP
10.0000 mg | Freq: Once | INTRAMUSCULAR | Status: AC
  Administered 2017-03-04: 10 mg

## 2017-03-04 NOTE — Progress Notes (Signed)
Subjective:    Patient ID: [name], female   DOB: 61 y.o.   MRN: [id number]   HPI patient states that she's developed a lot of pain in her left arch and she's also concerned about discoloration in her right hallux nail and states that it is making it difficult with the heel for her to be ambulatory and she does have MS is already is utilizing a cane    ROS      Objective:  Physical Exam neurovascular status intact negative Homans sign was noted with patient found to have exquisite discomfort in the left plantar fashion at the insertional point and distal within the mid arch band.  there is moderate fluid in the tendon does appear to be intact with no indications or rupture     Assessment:   Acute plantar fasciitis left mid arch and also mycotic nail right hallux of a chronic nature      Plan:   H&P conditions reviewed. For the mid arch fascial left I did inject the mid arch 3 mg Kenalog 5 mg Xylocaine and instructed on physical therapy and dispensed night splint due to the chronic nature of condition and the fact it's very tender after periods of sitting and when getting up in the morning. I instructed on heat ice therapy and dispensed ice packs and for the nail we will initiate topical in conjunction with oral therapy 60 day treatment with liver function studies which will be done and we will initiate laser therapy 3 treatments to the offending nail right hallux. Pictures were taken today

## 2017-03-12 DIAGNOSIS — G35 Multiple sclerosis: Secondary | ICD-10-CM | POA: Diagnosis not present

## 2017-03-17 ENCOUNTER — Ambulatory Visit: Payer: Medicare Other

## 2017-03-24 ENCOUNTER — Other Ambulatory Visit: Payer: Self-pay | Admitting: Podiatry

## 2017-03-24 DIAGNOSIS — R339 Retention of urine, unspecified: Secondary | ICD-10-CM | POA: Diagnosis not present

## 2017-03-24 NOTE — Telephone Encounter (Signed)
Pt needs an appt prior to future refills. 

## 2017-04-13 ENCOUNTER — Other Ambulatory Visit: Payer: Medicare Other | Admitting: Internal Medicine

## 2017-04-13 DIAGNOSIS — R2689 Other abnormalities of gait and mobility: Secondary | ICD-10-CM | POA: Diagnosis not present

## 2017-04-13 DIAGNOSIS — Z79899 Other long term (current) drug therapy: Secondary | ICD-10-CM | POA: Diagnosis not present

## 2017-04-13 DIAGNOSIS — G35 Multiple sclerosis: Secondary | ICD-10-CM | POA: Diagnosis not present

## 2017-04-17 ENCOUNTER — Encounter: Payer: Self-pay | Admitting: Internal Medicine

## 2017-04-20 ENCOUNTER — Encounter: Payer: Medicare Other | Admitting: Internal Medicine

## 2017-04-23 DIAGNOSIS — N3020 Other chronic cystitis without hematuria: Secondary | ICD-10-CM | POA: Diagnosis not present

## 2017-04-23 DIAGNOSIS — G35 Multiple sclerosis: Secondary | ICD-10-CM | POA: Diagnosis not present

## 2017-04-23 DIAGNOSIS — R8271 Bacteriuria: Secondary | ICD-10-CM | POA: Diagnosis not present

## 2017-04-27 ENCOUNTER — Ambulatory Visit (INDEPENDENT_AMBULATORY_CARE_PROVIDER_SITE_OTHER): Payer: Medicare Other | Admitting: Internal Medicine

## 2017-04-27 ENCOUNTER — Encounter: Payer: Self-pay | Admitting: Internal Medicine

## 2017-04-27 VITALS — BP 132/90 | HR 71 | Temp 98.2°F | Ht 65.0 in | Wt 166.4 lb

## 2017-04-27 DIAGNOSIS — Z0000 Encounter for general adult medical examination without abnormal findings: Secondary | ICD-10-CM

## 2017-04-27 DIAGNOSIS — G35 Multiple sclerosis: Secondary | ICD-10-CM

## 2017-04-27 DIAGNOSIS — Z9181 History of falling: Secondary | ICD-10-CM

## 2017-04-27 DIAGNOSIS — M7918 Myalgia, other site: Secondary | ICD-10-CM

## 2017-04-27 DIAGNOSIS — Z79899 Other long term (current) drug therapy: Secondary | ICD-10-CM

## 2017-04-27 LAB — BASIC METABOLIC PANEL
BUN: 12 mg/dL (ref 6–23)
CALCIUM: 9.4 mg/dL (ref 8.4–10.5)
CO2: 29 meq/L (ref 19–32)
Chloride: 102 mEq/L (ref 96–112)
Creatinine, Ser: 0.61 mg/dL (ref 0.40–1.20)
GFR: 105.29 mL/min (ref >60.00)
Glucose, Bld: 85 mg/dL (ref 70–99)
Potassium: 4.1 mEq/L (ref 3.5–5.1)
Sodium: 140 mEq/L (ref 135–145)

## 2017-04-27 LAB — LIPID PANEL
CHOLESTEROL: 215 mg/dL — AB (ref 0–200)
HDL: 66.5 mg/dL (ref >39.00)
LDL CALC: 127 mg/dL — AB (ref 0–99)
NonHDL: 148.23
Total CHOL/HDL Ratio: 3
Triglycerides: 106 mg/dL (ref 0.0–149.0)
VLDL: 21.2 mg/dL (ref 0.0–40.0)

## 2017-04-27 LAB — CBC WITH DIFFERENTIAL/PLATELET
BASOS ABS: 0 10*3/uL (ref 0.0–0.1)
Basophils Relative: 0.6 % (ref 0.0–3.0)
Eosinophils Absolute: 0.3 10*3/uL (ref 0.0–0.7)
Eosinophils Relative: 3 % (ref 0.0–5.0)
HCT: 37.5 % (ref 36.0–46.0)
Hemoglobin: 12.9 g/dL (ref 12.0–15.0)
LYMPHS ABS: 3.2 10*3/uL (ref 0.7–4.0)
Lymphocytes Relative: 38.8 % (ref 12.0–46.0)
MCHC: 34.5 g/dL (ref 30.0–36.0)
MCV: 89.8 fl (ref 78.0–100.0)
MONO ABS: 0.7 10*3/uL (ref 0.1–1.0)
Monocytes Relative: 8.5 % (ref 3.0–12.0)
NEUTROS ABS: 4.1 10*3/uL (ref 1.4–7.7)
NEUTROS PCT: 49.1 % (ref 43.0–77.0)
PLATELETS: 271 10*3/uL (ref 150.0–400.0)
RBC: 4.18 Mil/uL (ref 3.87–5.11)
RDW: 13.4 % (ref 11.5–15.5)
WBC: 8.3 10*3/uL (ref 4.0–10.5)

## 2017-04-27 LAB — HEPATIC FUNCTION PANEL
ALBUMIN: 4.5 g/dL (ref 3.5–5.2)
ALK PHOS: 67 U/L (ref 39–117)
ALT: 19 U/L (ref 0–35)
AST: 17 U/L (ref 0–37)
BILIRUBIN TOTAL: 0.8 mg/dL (ref 0.2–1.2)
Bilirubin, Direct: 0.1 mg/dL (ref 0.0–0.3)
Total Protein: 6.4 g/dL (ref 6.0–8.3)

## 2017-04-27 LAB — TSH: TSH: 1.01 u[IU]/mL (ref 0.35–4.50)

## 2017-04-27 MED ORDER — ZOSTER VAC RECOMB ADJUVANTED 50 MCG/0.5ML IM SUSR: 0.5000 mL | Freq: Once | INTRAMUSCULAR | 1 refills | Status: AC

## 2017-04-27 NOTE — Progress Notes (Signed)
Chief Complaint  Patient presents with  . Annual Exam    Pt would like to discuss her meloxicam medication, pt has appt with her OBGYN in Oct for her pap    HPI: [name] 61 y.o. comes in today for Preventive Medicare exam/visit .Since last visit. She is seen orthopedics and podiatrist about her toe Meloxicam is been helpful she has had 2 prescriptions of 60 and seems to help her most for aches and pains Her neurologist advised speaking with PCP about continuing his meds. Multiple sclerosis seems to be stable. 3 falls no major injuries recently. Uses a walker at home and around that helps her get out. She is on Cymbalta for her migraines. States her blood pressures usually 120 130  range.  Health Maintenance  Topic Date Due  . Hepatitis C Screening  1956/04/05  . HIV Screening  04/24/1969  . PAP SMEAR  10/10/2014  . MAMMOGRAM  11/26/2014  . COLONOSCOPY  01/09/2025  . TETANUS/TDAP  07/11/2025  . INFLUENZA VACCINE  Completed   Health Maintenance Review LIFESTYLE:  Exercise:   Tobacco/ETS: n Alcohol:  2 per night  wine Sugar beverages: no Sleep:  9- 10  Drug use: no HH:  2  2 cats      Hearing:  Ok   Vision:  No limitations at present . Last eye check UTD  Safety:  Has smoke detector and wears seat belts.  No firearms. No excess sun exposure. Sees dentist regularly.  Falls: 3 this year  Feb from [place].   Memory: Felt to same   ,    Depression: No anhedonia unusual crying or depressive symptoms  Nutrition: Eats well balanced diet; adequate calcium and vitamin D. No swallowing chewing problems.  Injury: no major injuries in the last six months.  Other healthcare providers:  Reviewed today .  Social:  Lives with spouse married. No pets.   Preventive parameters: up-to-date  Reviewed   ADLS:   There are no problems or need for assistance  driving, feeding, obtaining food, dressing, toileting and bathing, managing money using phone. She is independent. X   Mobility balance  Cletus Gash and drives     ROS:  Self cath at times  uti prophylaxis  Foot  Problem  mobic  Helps   A lot  But then advised not to take aeveryt day .  Dry mouth some allergy antihistamine GEN/ HEENT: No fever, significant weight changes sweats headaches vision problems hearing changes, CV/ PULM; No chest pain shortness of breath cough, syncope,edema  change in exercise tolerance. GI /GU: No adominal pain, vomiting, change in bowel habits. No blood in the stool. No significant GU symptoms. SKIN/HEME: ,no acute skin rashes suspicious lesions or bleeding. No lymphadenopathy, nodules, masses.  NEURO/ PSYCH:  No neurologic signs such as weakness numbness. No depression anxiety. IMM/ Allergy: No unusual infections.  Allergy .   REST of 12 system review negative except as per HPI   Past Medical History:  Diagnosis Date  . Anemia   . Blood in stool, frank 12/01/2011  . Depressive reaction   . Diverticulitis   . Diverticulosis   . Frequent UTI LAST 7/13  . 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 Mood disorder ([place])   . MS (multiple sclerosis) ([place])   . Rhinitis   . Superficial phlebitis   . Varicose veins     Family History  Problem Relation Age of Onset  . Lung cancer Father   . Colon cancer  Neg Hx     Social History   Social History  . Marital status: Married    Spouse name: N/A  . Number of children: 4  . Years of education: N/A   Occupational History  . Disability    Social History Main Topics  . Smoking status: Never Smoker  . Smokeless tobacco: Never Used  . Alcohol use 0.0 oz/week     Comment: occ  . Drug use: No  . Sexual activity: Not Asked   Other Topics Concern  . None   Social History Narrative   Unable to volunteer    Now on disability   Children now out of home   Daily caffeine     Outpatient Encounter Prescriptions as of 04/27/2017  Medication Sig  . Cholecalciferol (VITAMIN D-3) 5000 UNITS TABS Take 1 tablet by mouth daily.  . DULoxetine  (CYMBALTA) 30 MG capsule Take 1 cap daily x 1wk, then 2 caps daily thereafter.  . esomeprazole (NEXIUM) 20 MG capsule Take 20-40 mg by mouth daily as needed (acid reflux).  . Loratadine (CLARITIN PO) Take by mouth.  . meloxicam (MOBIC) 15 MG tablet TAKE ONE TABLET BY MOUTH DAILY  . Meth-Hyo-M Bl-Na Phos-Ph Sal (URIBEL) 118 MG CAPS Take 1 capsule by mouth daily as needed (bladder spasms). Using PRN  . natalizumab (TYSABRI) 300 MG/15ML injection Inject into the vein every 30 (thirty) days.   . Probiotic Product (PROBIOTIC PEARLS) CAPS Take 1 capsule by mouth daily.  Marland Kitchen terbinafine (LAMISIL) 250 MG tablet Take 1 tablet (250 mg total) by mouth daily.  Marland Kitchen tiZANidine (ZANAFLEX) 4 MG tablet Take 8 mg by mouth at bedtime.   Marland Kitchen buPROPion (WELLBUTRIN XL) 300 MG 24 hr tablet Take 300 mg by mouth daily.  Marland Kitchen etodolac (LODINE) 200 MG capsule Take 200 mg by mouth daily.  Marland Kitchen Zoster Vac Recomb Adjuvanted [facility]) injection Inject 0.5 mLs into the muscle once. Repeat in 2-6 months  . [DISCONTINUED] hydrocortisone (ANUSOL-HC) 25 MG suppository Place 1 suppository (25 mg total) rectally at bedtime. (Patient not taking: Reported on 04/27/2017)   No facility-administered encounter medications on file as of 04/27/2017.     EXAM:  BP 132/90 (BP Location: Right Arm, Patient Position: Sitting, Cuff Size: Normal)   Pulse 71   Temp 98.2 F (36.8 C) (Oral)   Ht '5\' 5"'  (1.651 m)   Wt 166 lb 6.4 oz (75.5 kg)   SpO2 98%   BMI 27.69 kg/m   Body mass index is 27.69 kg/m.  Physical Exam: Vital signs reviewed VVZ:SMOL is a well-developed well-nourished alert cooperative   who appears stated age in no acute distress.  HEENT: normocephalic atraumatic , Eyes: PERRL EOM's full, conjunctiva clear, Nares: paten,t no deformity discharge or tenderness., Ears: no deformity EAC's clear TMs with normal landmarks. Mouth: clear OP, no lesions, edema.  Moist mucous membranes. Dentition in adequate repair. NECK: supple without masses,  thyromegaly or bruits. CHEST/PULM:  Clear to auscultation and percussion breath sounds equal no wheeze , rales or rhonchi. No chest wall deformities or tenderness. CV: PMI is nondisplaced, S1 S2 no gallops, murmurs, rubs. Peripheral pulses are full without delay.No JVD . breast exam declined  Has gyne appt  ABDOMEN: Bowel sounds normal nontender  No guard or rebound, no hepato splenomegal no CVA tenderness.   Extremtities:  No clubbing cyanosis or edema, no acute joint swelling or redness no focal atrophy NEURO:  Oriented x3, cranial nerves 3-12 appear to be intact, dec m mass distal legs  But no lesion  Gait    tenative but independent SKIN: No acute rashes normal turgor, color, no bruising or petechiae. Toenail thickening  PSYCH: Oriented, good eye contact, no obvious depression anxiety, cognition and judgment appear normal. LN: no cervical axillary inguinal adenopathy No noted gross  deficits in memory, attention, and speech.   Lab Results  Component Value Date   WBC 8.3 04/27/2017   HGB 12.9 04/27/2017   HCT 37.5 04/27/2017   PLT 271.0 04/27/2017   GLUCOSE 85 04/27/2017   CHOL 215 (H) 04/27/2017   TRIG 106.0 04/27/2017   HDL 66.50 04/27/2017   LDLDIRECT 110.0 04/23/2015   LDLCALC 127 (H) 04/27/2017   ALT 19 04/27/2017   AST 17 04/27/2017   NA 140 04/27/2017   K 4.1 04/27/2017   CL 102 04/27/2017   CREATININE 0.61 04/27/2017   BUN 12 04/27/2017   CO2 29 04/27/2017   TSH 1.01 04/27/2017   HGBA1C 5.2 02/13/2014    ASSESSMENT AND PLAN:  Discussed the following assessment and plan:  Visit for preventive health examination - Plan: Basic metabolic panel, CBC with Differential/Platelet, Hepatic function panel, Lipid panel, TSH  SCLEROSIS, MULTIPLE - moto seemingly stable  urinary controlled sefl cath tmp prophylaxis for uti - Plan: Basic metabolic panel, CBC with Differential/Platelet, Hepatic function panel, Lipid panel, TSH  Medication management - Plan: Basic metabolic  panel, CBC with Differential/Platelet, Hepatic function panel, Lipid panel, TSH  Hx of falling  Musculoskeletal pain - Plan: Basic metabolic panel, CBC with Differential/Platelet, Hepatic function panel, Lipid panel, TSH  High risk medication use - Plan: Basic metabolic panel, CBC with Differential/Platelet, Hepatic function panel, Lipid panel, TSH  Shingrix  duisc advised when convenient Discussed use of meloxicam risk-benefit related to GI and renal side effects. She will ask podiatry for refill and if needed in the future contact us with understanding of limited use. Checking renal function today. Patient Care Team: [name], [name], MD as PCP - General Konrad Penta, MD as Referring Physician (Obstetrics and Gynecology) Britt Bottom, MD (Neurology) Carolan Clines, MD as Attending Physician (Urology) Ceasar Mons, MD as Consulting Physician (Urology) Sydnee Levans, MD as Consulting Physician (Dermatology)  Patient Instructions  Continue lifestyle intervention healthy eating and exercise .   caution with  Antiinflammatory   meds  To limit  Gi   And kidney  Damage.   Goal is 120/80  bp .    Will notify you  of labs when available.   Preventive Care 40-64 Years, Female Preventive care refers to lifestyle choices and visits with your health care provider that can promote health and wellness. What does preventive care include?  A yearly physical exam. This is also called an annual well check.  Dental exams once or twice a year.  Routine eye exams. Ask your health care provider how often you should have your eyes checked.  Personal lifestyle choices, including: ? Daily care of your teeth and gums. ? Regular physical activity. ? Eating a healthy diet. ? Avoiding tobacco and drug use. ? Limiting alcohol use. ? Practicing safe sex. ? Taking low-dose aspirin daily starting at age 35. ? Taking vitamin and mineral supplements as recommended by your  health care provider. What happens during an annual well check? The services and screenings done by your health care provider during your annual well check will depend on your age, overall health, lifestyle risk factors, and family history of disease. Counseling Your health care provider may ask you questions  about your:  Alcohol use.  Tobacco use.  Drug use.  Emotional well-being.  Home and relationship well-being.  Sexual activity.  Eating habits.  Work and work Statistician.  Method of birth control.  Menstrual cycle.  Pregnancy history.  Screening You may have the following tests or measurements:  Height, weight, and BMI.  Blood pressure.  Lipid and cholesterol levels. These may be checked every 5 years, or more frequently if you are over 58 years old.  Skin check.  Lung cancer screening. You may have this screening every year starting at age 27 if you have a 30-pack-year history of smoking and currently smoke or have quit within the past 15 years.  Fecal occult blood test (FOBT) of the stool. You may have this test every year starting at age 52.  Flexible sigmoidoscopy or colonoscopy. You may have a sigmoidoscopy every 5 years or a colonoscopy every 10 years starting at age 35.  Hepatitis C blood test.  Hepatitis B blood test.  Sexually transmitted disease (STD) testing.  Diabetes screening. This is done by checking your blood sugar (glucose) after you have not eaten for a while (fasting). You may have this done every 1-3 years.  Mammogram. This may be done every 1-2 years. Talk to your health care provider about when you should start having regular mammograms. This may depend on whether you have a family history of breast cancer.  BRCA-related cancer screening. This may be done if you have a family history of breast, ovarian, tubal, or peritoneal cancers.  Pelvic exam and Pap test. This may be done every 3 years starting at age 30. Starting at age 76,  this may be done every 5 years if you have a Pap test in combination with an HPV test.  Bone density scan. This is done to screen for osteoporosis. You may have this scan if you are at high risk for osteoporosis.  Discuss your test results, treatment options, and if necessary, the need for more tests with your health care provider. Vaccines Your health care provider may recommend certain vaccines, such as:  Influenza vaccine. This is recommended every year.  Tetanus, diphtheria, and acellular pertussis (Tdap, Td) vaccine. You may need a Td booster every 10 years.  Varicella vaccine. You may need this if you have not been vaccinated.  Zoster vaccine. You may need this after age 76.  Measles, mumps, and rubella (MMR) vaccine. You may need at least one dose of MMR if you were born in 1957 or later. You may also need a second dose.  Pneumococcal 13-valent conjugate (PCV13) vaccine. You may need this if you have certain conditions and were not previously vaccinated.  Pneumococcal polysaccharide (PPSV23) vaccine. You may need one or two doses if you smoke cigarettes or if you have certain conditions.  Meningococcal vaccine. You may need this if you have certain conditions.  Hepatitis A vaccine. You may need this if you have certain conditions or if you travel or work in places where you may be exposed to hepatitis A.  Hepatitis B vaccine. You may need this if you have certain conditions or if you travel or work in places where you may be exposed to hepatitis B.  Haemophilus influenzae type b (Hib) vaccine. You may need this if you have certain conditions.  Talk to your health care provider about which screenings and vaccines you need and how often you need them. This information is not intended to replace advice given to you by your  health care provider. Make sure you discuss any questions you have with your health care provider. Document Released: 08/10/2015 Document Revised: 04/02/2016  Document Reviewed: 05/15/2015 Elsevier Interactive Patient Education  2017 [place] K. [name] M.D.

## 2017-04-27 NOTE — Patient Instructions (Addendum)
Continue lifestyle intervention healthy eating and exercise .   caution with  Antiinflammatory   meds  To limit  Gi   And kidney  Damage.   Goal is 120/80  bp .    Will notify you  of labs when available.   Preventive Care 40-64 Years, Female Preventive care refers to lifestyle choices and visits with your health care provider that can promote health and wellness. What does preventive care include?  A yearly physical exam. This is also called an annual well check.  Dental exams once or twice a year.  Routine eye exams. Ask your health care provider how often you should have your eyes checked.  Personal lifestyle choices, including: ? Daily care of your teeth and gums. ? Regular physical activity. ? Eating a healthy diet. ? Avoiding tobacco and drug use. ? Limiting alcohol use. ? Practicing safe sex. ? Taking low-dose aspirin daily starting at age 68. ? Taking vitamin and mineral supplements as recommended by your health care provider. What happens during an annual well check? The services and screenings done by your health care provider during your annual well check will depend on your age, overall health, lifestyle risk factors, and family history of disease. Counseling Your health care provider may ask you questions about your:  Alcohol use.  Tobacco use.  Drug use.  Emotional well-being.  Home and relationship well-being.  Sexual activity.  Eating habits.  Work and work Statistician.  Method of birth control.  Menstrual cycle.  Pregnancy history.  Screening You may have the following tests or measurements:  Height, weight, and BMI.  Blood pressure.  Lipid and cholesterol levels. These may be checked every 5 years, or more frequently if you are over 77 years old.  Skin check.  Lung cancer screening. You may have this screening every year starting at age 30 if you have a 30-pack-year history of smoking and currently smoke or have quit within the past 15  years.  Fecal occult blood test (FOBT) of the stool. You may have this test every year starting at age 15.  Flexible sigmoidoscopy or colonoscopy. You may have a sigmoidoscopy every 5 years or a colonoscopy every 10 years starting at age 47.  Hepatitis C blood test.  Hepatitis B blood test.  Sexually transmitted disease (STD) testing.  Diabetes screening. This is done by checking your blood sugar (glucose) after you have not eaten for a while (fasting). You may have this done every 1-3 years.  Mammogram. This may be done every 1-2 years. Talk to your health care provider about when you should start having regular mammograms. This may depend on whether you have a family history of breast cancer.  BRCA-related cancer screening. This may be done if you have a family history of breast, ovarian, tubal, or peritoneal cancers.  Pelvic exam and Pap test. This may be done every 3 years starting at age 69. Starting at age 66, this may be done every 5 years if you have a Pap test in combination with an HPV test.  Bone density scan. This is done to screen for osteoporosis. You may have this scan if you are at high risk for osteoporosis.  Discuss your test results, treatment options, and if necessary, the need for more tests with your health care provider. Vaccines Your health care provider may recommend certain vaccines, such as:  Influenza vaccine. This is recommended every year.  Tetanus, diphtheria, and acellular pertussis (Tdap, Td) vaccine. You may need a  Td booster every 10 years.  Varicella vaccine. You may need this if you have not been vaccinated.  Zoster vaccine. You may need this after age 77.  Measles, mumps, and rubella (MMR) vaccine. You may need at least one dose of MMR if you were born in 1957 or later. You may also need a second dose.  Pneumococcal 13-valent conjugate (PCV13) vaccine. You may need this if you have certain conditions and were not previously  vaccinated.  Pneumococcal polysaccharide (PPSV23) vaccine. You may need one or two doses if you smoke cigarettes or if you have certain conditions.  Meningococcal vaccine. You may need this if you have certain conditions.  Hepatitis A vaccine. You may need this if you have certain conditions or if you travel or work in places where you may be exposed to hepatitis A.  Hepatitis B vaccine. You may need this if you have certain conditions or if you travel or work in places where you may be exposed to hepatitis B.  Haemophilus influenzae type b (Hib) vaccine. You may need this if you have certain conditions.  Talk to your health care provider about which screenings and vaccines you need and how often you need them. This information is not intended to replace advice given to you by your health care provider. Make sure you discuss any questions you have with your health care provider. Document Released: 08/10/2015 Document Revised: 04/02/2016 Document Reviewed: 05/15/2015 Elsevier Interactive Patient Education  2017 [employer].

## 2017-04-28 DIAGNOSIS — L820 Inflamed seborrheic keratosis: Secondary | ICD-10-CM | POA: Diagnosis not present

## 2017-04-28 DIAGNOSIS — L821 Other seborrheic keratosis: Secondary | ICD-10-CM | POA: Diagnosis not present

## 2017-04-28 DIAGNOSIS — D2261 Melanocytic nevi of right upper limb, including shoulder: Secondary | ICD-10-CM | POA: Diagnosis not present

## 2017-04-28 DIAGNOSIS — D224 Melanocytic nevi of scalp and neck: Secondary | ICD-10-CM | POA: Diagnosis not present

## 2017-04-28 DIAGNOSIS — D1801 Hemangioma of skin and subcutaneous tissue: Secondary | ICD-10-CM | POA: Diagnosis not present

## 2017-05-11 ENCOUNTER — Encounter: Payer: Self-pay | Admitting: Emergency Medicine

## 2017-05-13 DIAGNOSIS — G35 Multiple sclerosis: Secondary | ICD-10-CM | POA: Diagnosis not present

## 2017-05-18 DIAGNOSIS — Z1231 Encounter for screening mammogram for malignant neoplasm of breast: Secondary | ICD-10-CM | POA: Diagnosis not present

## 2017-05-18 DIAGNOSIS — N952 Postmenopausal atrophic vaginitis: Secondary | ICD-10-CM | POA: Diagnosis not present

## 2017-05-18 DIAGNOSIS — Z01419 Encounter for gynecological examination (general) (routine) without abnormal findings: Secondary | ICD-10-CM | POA: Diagnosis not present

## 2017-05-19 DIAGNOSIS — M79672 Pain in left foot: Secondary | ICD-10-CM | POA: Diagnosis not present

## 2017-05-19 DIAGNOSIS — M79621 Pain in right upper arm: Secondary | ICD-10-CM | POA: Diagnosis not present

## 2017-05-28 DIAGNOSIS — R339 Retention of urine, unspecified: Secondary | ICD-10-CM | POA: Diagnosis not present

## 2017-06-10 DIAGNOSIS — G35 Multiple sclerosis: Secondary | ICD-10-CM | POA: Diagnosis not present

## 2017-06-10 DIAGNOSIS — M79601 Pain in right arm: Secondary | ICD-10-CM | POA: Diagnosis not present

## 2017-06-10 DIAGNOSIS — Z79899 Other long term (current) drug therapy: Secondary | ICD-10-CM | POA: Diagnosis not present

## 2017-07-16 DIAGNOSIS — N3020 Other chronic cystitis without hematuria: Secondary | ICD-10-CM | POA: Diagnosis not present

## 2017-07-16 DIAGNOSIS — B961 Klebsiella pneumoniae [K. pneumoniae] as the cause of diseases classified elsewhere: Secondary | ICD-10-CM | POA: Diagnosis not present

## 2017-07-16 DIAGNOSIS — N390 Urinary tract infection, site not specified: Secondary | ICD-10-CM | POA: Diagnosis not present

## 2017-07-23 DIAGNOSIS — G35 Multiple sclerosis: Secondary | ICD-10-CM | POA: Diagnosis not present

## 2017-07-28 DIAGNOSIS — S060XAA Concussion with loss of consciousness status unknown, initial encounter: Secondary | ICD-10-CM

## 2017-07-28 DIAGNOSIS — Z87820 Personal history of traumatic brain injury: Secondary | ICD-10-CM

## 2017-07-28 DIAGNOSIS — S060X9A Concussion with loss of consciousness of unspecified duration, initial encounter: Secondary | ICD-10-CM

## 2017-07-28 HISTORY — DX: Personal history of traumatic brain injury: Z87.820

## 2017-07-28 HISTORY — DX: Concussion with loss of consciousness status unknown, initial encounter: S06.0XAA

## 2017-07-28 HISTORY — DX: Concussion with loss of consciousness of unspecified duration, initial encounter: S06.0X9A

## 2017-08-03 DIAGNOSIS — R339 Retention of urine, unspecified: Secondary | ICD-10-CM | POA: Diagnosis not present

## 2017-08-10 DIAGNOSIS — N3020 Other chronic cystitis without hematuria: Secondary | ICD-10-CM | POA: Diagnosis not present

## 2017-08-20 DIAGNOSIS — N949 Unspecified condition associated with female genital organs and menstrual cycle: Secondary | ICD-10-CM | POA: Diagnosis not present

## 2017-08-26 DIAGNOSIS — Z79899 Other long term (current) drug therapy: Secondary | ICD-10-CM | POA: Diagnosis not present

## 2017-08-26 DIAGNOSIS — M79601 Pain in right arm: Secondary | ICD-10-CM | POA: Diagnosis not present

## 2017-08-26 DIAGNOSIS — G35 Multiple sclerosis: Secondary | ICD-10-CM | POA: Diagnosis not present

## 2017-08-27 DIAGNOSIS — M79601 Pain in right arm: Secondary | ICD-10-CM | POA: Diagnosis not present

## 2017-09-01 DIAGNOSIS — H5203 Hypermetropia, bilateral: Secondary | ICD-10-CM | POA: Diagnosis not present

## 2017-09-01 DIAGNOSIS — G35 Multiple sclerosis: Secondary | ICD-10-CM | POA: Diagnosis not present

## 2017-09-01 DIAGNOSIS — H2513 Age-related nuclear cataract, bilateral: Secondary | ICD-10-CM | POA: Diagnosis not present

## 2017-09-01 DIAGNOSIS — Z79899 Other long term (current) drug therapy: Secondary | ICD-10-CM | POA: Diagnosis not present

## 2017-09-15 DIAGNOSIS — G35 Multiple sclerosis: Secondary | ICD-10-CM | POA: Diagnosis not present

## 2017-09-15 DIAGNOSIS — M79601 Pain in right arm: Secondary | ICD-10-CM | POA: Diagnosis not present

## 2017-09-17 DIAGNOSIS — R339 Retention of urine, unspecified: Secondary | ICD-10-CM | POA: Diagnosis not present

## 2017-09-29 DIAGNOSIS — G35 Multiple sclerosis: Secondary | ICD-10-CM | POA: Diagnosis not present

## 2017-09-29 DIAGNOSIS — Z79899 Other long term (current) drug therapy: Secondary | ICD-10-CM | POA: Diagnosis not present

## 2017-09-29 DIAGNOSIS — M79601 Pain in right arm: Secondary | ICD-10-CM | POA: Diagnosis not present

## 2017-10-20 DIAGNOSIS — R339 Retention of urine, unspecified: Secondary | ICD-10-CM | POA: Diagnosis not present

## 2017-10-27 DIAGNOSIS — Z79899 Other long term (current) drug therapy: Secondary | ICD-10-CM | POA: Diagnosis not present

## 2017-10-27 DIAGNOSIS — G35 Multiple sclerosis: Secondary | ICD-10-CM | POA: Diagnosis not present

## 2017-10-27 DIAGNOSIS — M79601 Pain in right arm: Secondary | ICD-10-CM | POA: Diagnosis not present

## 2017-10-27 DIAGNOSIS — R2689 Other abnormalities of gait and mobility: Secondary | ICD-10-CM | POA: Diagnosis not present

## 2017-11-18 DIAGNOSIS — R339 Retention of urine, unspecified: Secondary | ICD-10-CM | POA: Diagnosis not present

## 2017-11-24 DIAGNOSIS — M79601 Pain in right arm: Secondary | ICD-10-CM | POA: Diagnosis not present

## 2017-11-24 DIAGNOSIS — G35 Multiple sclerosis: Secondary | ICD-10-CM | POA: Diagnosis not present

## 2017-11-24 DIAGNOSIS — Z79899 Other long term (current) drug therapy: Secondary | ICD-10-CM | POA: Diagnosis not present

## 2017-12-29 DIAGNOSIS — R339 Retention of urine, unspecified: Secondary | ICD-10-CM | POA: Diagnosis not present

## 2017-12-30 DIAGNOSIS — Z79899 Other long term (current) drug therapy: Secondary | ICD-10-CM | POA: Diagnosis not present

## 2017-12-30 DIAGNOSIS — M79601 Pain in right arm: Secondary | ICD-10-CM | POA: Diagnosis not present

## 2017-12-30 DIAGNOSIS — G35 Multiple sclerosis: Secondary | ICD-10-CM | POA: Diagnosis not present

## 2018-01-25 DIAGNOSIS — R339 Retention of urine, unspecified: Secondary | ICD-10-CM | POA: Diagnosis not present

## 2018-02-01 NOTE — Progress Notes (Addendum)
Subjective:   [name] is a 62 y.o. female who presents for Medicare Annual (Subsequent) preventive examination.  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 Colonoscopy 12/2014 Mammogram 11/2012 Dexa 04/2016 -1.2 Pap 09/2011  Colonoscopy 12/2014   [facility] is so busy, can't get an apt  Is there other urologist   Mammogram every year - completes in [place]       Objective:     Vitals: BP (!) 144/90   Pulse 79   Ht 5\' 6"  (1.676 m)   Wt 167 lb 8 oz (76 kg)   SpO2 95%   BMI 27.04 kg/m   Body mass index is 27.04 kg/m.  Advanced Directives 02/02/2018 12/04/2015 07/12/2015 04/03/2015 06/01/2012 05/28/2012  Does Patient Have a Medical Advance Directive? Yes Yes No No Patient has advance directive, copy not in chart Patient has advance directive, copy not in chart  Type of Advance Directive - Living will;Healthcare Power of [place];Living will [place];Living will  Copy of [place] in Chart? - - - - (No Data) -  Would patient like information on creating a medical advance directive? - - No - patient declined information No - patient declined information - -  Pre-existing out of facility DNR order (yellow form or pink MOST form) - - - - No -    Tobacco Social History   Tobacco Use  Smoking Status Never Smoker  Smokeless Tobacco Never Used     Counseling given: Yes   Clinical Intake:    Past Medical History:  Diagnosis Date  . Anemia   . Blood in stool, frank 12/01/2011  . Depressive  reaction   . Diverticulitis   . Diverticulosis   . Frequent UTI LAST 7/13  . 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 Mood disorder ([place])   . MS (multiple sclerosis) ([place])   . Rhinitis   . Superficial phlebitis   . Varicose veins    Past Surgical History:  Procedure Laterality Date  . ABDOMINAL HYSTERECTOMY  2008  . BLADDER SURGERY  2008   Tack  . SHOULDER ARTHROSCOPY WITH ROTATOR CUFF REPAIR AND SUBACROMIAL DECOMPRESSION  06/01/2012   Procedure: SHOULDER ARTHROSCOPY WITH ROTATOR CUFF REPAIR AND SUBACROMIAL DECOMPRESSION;  Surgeon: Magnus Sinning, MD;  Location: [facility];  Service: Orthopedics;  Laterality: Left;  LEFT SHOULDER ARTHROSCOPY WITH SUBACROMIAL DECOMPRESION AND DEBRIDEMENT OF PARTIAL ROTATOR CUFF TEAR  . TONSILLECTOMY AND ADENOIDECTOMY     Family History  Problem Relation Age of Onset  . Lung cancer Father   . Colon cancer Neg Hx    Social History   Socioeconomic History  . Marital status: Married    Spouse name: Not on file  . Number of children: 4  . Years of education: Not on file  . Highest education level: Not on file  Occupational History  . Occupation: Disability  Social Needs  . Emergency planning/management officer  strain: Not on file  . Food insecurity:    Worry: Not on file    Inability: Not on file  . Transportation needs:    Medical: Not on file    Non-medical: Not on file  Tobacco Use  . Smoking status: Never Smoker  . Smokeless tobacco: Never Used  Substance and Sexual Activity  . Alcohol use: Yes    Alcohol/week: 0.0 oz    Comment: occ  . Drug use: No  . Sexual activity: Not on file  Lifestyle  . Physical activity:    Days per week: Not on file    Minutes per session: Not on file  . Stress: Not on file  Relationships  . Social connections:    Talks on phone: Not on file    Gets together: Not on file    Attends religious service: Not on file    Active member of club or organization: Not on file    Attends meetings of clubs or  organizations: Not on file    Relationship status: Not on file  Other Topics Concern  . Not on file  Social History Narrative   Unable to volunteer    Now on disability   Children now out of home   Daily caffeine     Outpatient Encounter Medications as of 02/02/2018  Medication Sig  . Cholecalciferol (VITAMIN D-3) 5000 UNITS TABS Take 1 tablet by mouth daily.  . DULoxetine (CYMBALTA) 30 MG capsule Take 1 cap daily x 1wk, then 2 caps daily thereafter.  . esomeprazole (NEXIUM) 20 MG capsule Take 20-40 mg by mouth daily as needed (acid reflux).  . gabapentin (NEURONTIN) 300 MG capsule Take by mouth 1 day or 1 dose.  . Meth-Hyo-M Bl-Na Phos-Ph Sal (URIBEL) 118 MG CAPS Take 1 capsule by mouth daily as needed (bladder spasms). Using PRN  . natalizumab (TYSABRI) 300 MG/15ML injection Inject into the vein every 30 (thirty) days.   . Probiotic Product (PROBIOTIC PEARLS) CAPS Take 1 capsule by mouth daily.  Marland Kitchen tiZANidine (ZANAFLEX) 4 MG tablet Take 8 mg by mouth at bedtime.   Marland Kitchen trimethoprim (TRIMPEX) 100 MG tablet Take 100 mg by mouth 2 (two) times daily.  Marland Kitchen buPROPion (WELLBUTRIN XL) 300 MG 24 hr tablet Take 600 mg by mouth daily.   Marland Kitchen etodolac (LODINE) 200 MG capsule Take 200 mg by mouth daily.  . Loratadine (CLARITIN PO) Take by mouth.  . meloxicam (MOBIC) 15 MG tablet TAKE ONE TABLET BY MOUTH DAILY (Patient not taking: Reported on 02/02/2018)  . terbinafine (LAMISIL) 250 MG tablet Take 1 tablet (250 mg total) by mouth daily. (Patient not taking: Reported on 02/02/2018)   No facility-administered encounter medications on file as of 02/02/2018.     Activities of Daily Living In your present state of health, do you have any difficulty performing the following activities: 02/02/2018  Hearing? N  Vision? N  Difficulty concentrating or making decisions? N  Walking or climbing stairs? N  Dressing or bathing? N  Doing errands, shopping? N  Preparing Food and eating ? N  Using the Toilet? N  In the past  six months, have you accidently leaked urine? Y  Comment chronic UTI; get another colonoscpoy   Do you have problems with loss of bowel control? N  Comment wondered whether she can get another colonoscopy   Managing your Medications? N  Managing your Finances? N  Housekeeping or managing your Housekeeping? N  Some recent data might be hidden  Patient Care Team: [name], [name], MD as PCP - Silvestre Moment, MD as Referring Physician (Obstetrics and Gynecology) Carolan Clines, MD as Attending Physician (Urology) Ceasar Mons, MD as Consulting Physician (Urology) Sydnee Levans, MD as Consulting Physician (Dermatology) Karl Luke, MD as Referring Physician (Neurology)     Assessment:   This is a routine wellness examination for [facility].  Exercise Activities and Dietary recommendations    Goals      General   . Patient Stated     To see family and see son get married Continue to feel good        Fall Risk Fall Risk  02/02/2018 04/27/2017 03/17/2016  Falls in the past year? Yes - Yes  Number falls in past yr: 2 or more - 2 or more  Comment 3 or 4 times  - -  Injury with Fall? Yes - Yes  Comment did not have to go to the doctor  - -  Risk Factor Category  - - High Fall Risk  Risk for fall due to : Impaired balance/gait;Impaired mobility History of fall(s);Impaired balance/gait Impaired balance/gait  Follow up Education provided - Falls prevention discussed;Education provided  Comment loses her balance; not familiar with surrounding  - doing very well with assistance      Depression Screen PHQ 2/9 Scores 02/02/2018 04/27/2017 03/17/2016  PHQ - 2 Score 0 0 0     Cognitive Function   Ad8 score reviewed for issues:  Issues making decisions:  Less interest in hobbies / activities:  Repeats questions, stories (family complaining):  Trouble using ordinary gadgets (microwave, computer, phone):  Forgets the month or year:   Mismanaging  finances:   Remembering appts:  Daily problems with thinking and/or memory: Ad8 score is=0          Immunization History  Administered Date(s) Administered  . Influenza Whole 05/07/2010, 06/06/2011  . Influenza,inj,Quad PF,6+ Mos 05/10/2014, 04/23/2015  . Influenza-Unspecified 04/24/2017  . Tdap 02/05/2011, 07/12/2015  . Zoster 05/10/2014      Screening Tests Health Maintenance  Topic Date Due  . INFLUENZA VACCINE  02/25/2018  . MAMMOGRAM  02/03/2020  . PAP SMEAR  02/02/2021  . COLONOSCOPY  01/09/2025  . TETANUS/TDAP  07/11/2025  . Hepatitis C Screening  Discontinued  . HIV Screening  Discontinued        Plan:      PCP Notes   Health Maintenance Mammogram completed in [place] by Dr. Yolonda Kida  Pap completed every year by Dr. Yolonda Kida   Abnormal Screens  Abnormal Bowel movements; gets mucous Very dark  Will make fup apt with Dr. Regis Bill to fup; difficult to get an apt with GI   Referrals  Possibly [facility] when she sees Dr. Regis Bill The stated she will continue to try to get an apt with UR; but has a difficult time doing so. Declined assistance  Seeing Dr. Lovena Neighbours   Patient concerns; As noted   Nurse Concerns; As noted   Next PCP apt Will make quick fup apt for bowels etc  Will make apt for annual after 10/2  Will make [facility] apt    I have personally reviewed and noted the following in the patient's chart:   . Medical and social history . Use of alcohol, tobacco or illicit drugs  . Current medications and supplements . Functional ability and status . Nutritional status . Physical activity . Advanced directives . List of other physicians . Hospitalizations, surgeries, and ER visits in previous 12 months .  Vitals . Screenings to include cognitive, depression, and falls . Referrals and appointments  In addition, I have reviewed and discussed with patient certain preventive protocols, quality metrics, and best practice recommendations. A written  personalized care plan for preventive services as well as general preventive health recommendations were provided to patient.     Shanon Ace, MD  02/02/2018   Above noted reviewed and agree. Shanon Ace, MD

## 2018-02-02 ENCOUNTER — Ambulatory Visit (INDEPENDENT_AMBULATORY_CARE_PROVIDER_SITE_OTHER): Payer: Medicare Other

## 2018-02-02 VITALS — BP 144/90 | HR 79 | Ht 66.0 in | Wt 167.5 lb

## 2018-02-02 DIAGNOSIS — M79601 Pain in right arm: Secondary | ICD-10-CM | POA: Diagnosis not present

## 2018-02-02 DIAGNOSIS — Z0000 Encounter for general adult medical examination without abnormal findings: Secondary | ICD-10-CM

## 2018-02-02 DIAGNOSIS — G35 Multiple sclerosis: Secondary | ICD-10-CM | POA: Diagnosis not present

## 2018-02-02 DIAGNOSIS — Z79899 Other long term (current) drug therapy: Secondary | ICD-10-CM | POA: Diagnosis not present

## 2018-02-02 NOTE — Patient Instructions (Addendum)
[name] , Thank you for taking time to come for your Medicare Wellness Visit. I appreciate your ongoing commitment to your health goals. Please review the following plan we discussed and let me know if I can assist you in the future.   Will make an apt with Dr. Regis Bill for fup for bowel concerns  Will make apt with dr. Regis Bill after 10/1   Will draw the hep c but you have given blood in the last 15 years.  HIV screening; Manuela Schwartz will take out of epic   Shingrix is a vaccine for the prevention of Shingles in Adults 35 and older.  If you are on Medicare, the shingrix is covered under your Part D plan, so you will take both of the vaccines in the series at your pharmacy. Please check with your benefits regarding applicable copays or out of pocket expenses.  The Shingrix is given in 2 vaccines approx 8 weeks apart. You must receive the 2nd dose prior to 6 months from receipt of the first. Please have the pharmacist print out you Immunization  dates for our office records   Continue to get your skin checks once a year   These are the goals we discussed: Goals    . Patient Stated     To see family and see son get married Continue to feel good        This is a list of the screening recommended for you and due dates:  Health Maintenance  Topic Date Due  . Flu Shot  02/25/2018  . Mammogram  02/03/2020  . Pap Smear  02/02/2021  . Colon Cancer Screening  01/09/2025  . Tetanus Vaccine  07/11/2025  .  Hepatitis C: One time screening is recommended by Center for Disease Control  (CDC) for  adults born from [birth year] through 1967.   Discontinued  . HIV Screening  Discontinued   Prevention of falls: Remove rugs or any tripping hazards in the home Use Non slip mats in bathtubs and showers Placing grab bars next to the toilet and or shower Placing handrails on both sides of the stair way Adding extra lighting in the home.   Personal safety issues reviewed:  1. Consider starting a community  watch program per [facility] 2.  Changes batteries is smoke detector and/or carbon monoxide detector  3.  If you have firearms; keep them in a safe place 4.  Wear protection when in the sun; Always wear sunscreen or a hat; It is good to have your doctor check your skin annually or review any new areas of concern 5. Driving safety; Keep in the right lane; stay 3 car lengths behind the car in front of you on the highway; look 3 times prior to pulling out; carry your cell phone everywhere you go!       Health Maintenance for Postmenopausal Women Menopause is a normal process in which your reproductive ability comes to an end. This process happens gradually over a span of months to years, usually between the ages of 51 and 75. Menopause is complete when you have missed 12 consecutive menstrual periods. It is important to talk with your health care provider about some of the most common conditions that affect postmenopausal women, such as heart disease, cancer, and bone loss (osteoporosis). Adopting a healthy lifestyle and getting preventive care can help to promote your health and wellness. Those actions can also lower your chances of developing some of these common conditions. What should I  know about menopause? During menopause, you may experience a number of symptoms, such as:  Moderate-to-severe hot flashes.  Night sweats.  Decrease in sex drive.  Mood swings.  Headaches.  Tiredness.  Irritability.  Memory problems.  Insomnia.  Choosing to treat or not to treat menopausal changes is an individual decision that you make with your health care provider. What should I know about hormone replacement therapy and supplements? Hormone therapy products are effective for treating symptoms that are associated with menopause, such as hot flashes and night sweats. Hormone replacement carries certain risks, especially as you become older. If you are thinking about using estrogen or  estrogen with progestin treatments, discuss the benefits and risks with your health care provider. What should I know about heart disease and stroke? Heart disease, heart attack, and stroke become more likely as you age. This may be due, in part, to the hormonal changes that your body experiences during menopause. These can affect how your body processes dietary fats, triglycerides, and cholesterol. Heart attack and stroke are both medical emergencies. There are many things that you can do to help prevent heart disease and stroke:  Have your blood pressure checked at least every 1-2 years. High blood pressure causes heart disease and increases the risk of stroke.  If you are 10-84 years old, ask your health care provider if you should take aspirin to prevent a heart attack or a stroke.  Do not use any tobacco products, including cigarettes, chewing tobacco, or electronic cigarettes. If you need help quitting, ask your health care provider.  It is important to eat a healthy diet and maintain a healthy weight. ? Be sure to include plenty of vegetables, fruits, low-fat dairy products, and lean protein. ? Avoid eating foods that are high in solid fats, added sugars, or salt (sodium).  Get regular exercise. This is one of the most important things that you can do for your health. ? Try to exercise for at least 150 minutes each week. The type of exercise that you do should increase your heart rate and make you sweat. This is known as moderate-intensity exercise. ? Try to do strengthening exercises at least twice each week. Do these in addition to the moderate-intensity exercise.  Know your numbers.Ask your health care provider to check your cholesterol and your blood glucose. Continue to have your blood tested as directed by your health care provider.  What should I know about cancer screening? There are several types of cancer. Take the following steps to reduce your risk and to catch any cancer  development as early as possible. Breast Cancer  Practice breast self-awareness. ? This means understanding how your breasts normally appear and feel. ? It also means doing regular breast self-exams. Let your health care provider know about any changes, no matter how small.  If you are 25 or older, have a clinician do a breast exam (clinical breast exam or CBE) every year. Depending on your age, family history, and medical history, it may be recommended that you also have a yearly breast X-ray (mammogram).  If you have a family history of breast cancer, talk with your health care provider about genetic screening.  If you are at high risk for breast cancer, talk with your health care provider about having an MRI and a mammogram every year.  Breast cancer (BRCA) gene test is recommended for women who have family members with BRCA-related cancers. Results of the assessment will determine the need for genetic counseling and  BRCA1 and for BRCA2 testing. BRCA-related cancers include these types: ? Breast. This occurs in males or females. ? Ovarian. ? Tubal. This may also be called fallopian tube cancer. ? Cancer of the abdominal or pelvic lining (peritoneal cancer). ? Prostate. ? Pancreatic.  Cervical, Uterine, and Ovarian Cancer Your health care provider may recommend that you be screened regularly for cancer of the pelvic organs. These include your ovaries, uterus, and vagina. This screening involves a pelvic exam, which includes checking for microscopic changes to the surface of your cervix (Pap test).  For women ages 21-65, health care providers may recommend a pelvic exam and a Pap test every three years. For women ages 14-65, they may recommend the Pap test and pelvic exam, combined with testing for human papilloma virus (HPV), every five years. Some types of HPV increase your risk of cervical cancer. Testing for HPV may also be done on women of any age who have unclear Pap test  results.  Other health care providers may not recommend any screening for nonpregnant women who are considered low risk for pelvic cancer and have no symptoms. Ask your health care provider if a screening pelvic exam is right for you.  If you have had past treatment for cervical cancer or a condition that could lead to cancer, you need Pap tests and screening for cancer for at least 20 years after your treatment. If Pap tests have been discontinued for you, your risk factors (such as having a new sexual partner) need to be reassessed to determine if you should start having screenings again. Some women have medical problems that increase the chance of getting cervical cancer. In these cases, your health care provider may recommend that you have screening and Pap tests more often.  If you have a family history of uterine cancer or ovarian cancer, talk with your health care provider about genetic screening.  If you have vaginal bleeding after reaching menopause, tell your health care provider.  There are currently no reliable tests available to screen for ovarian cancer.  Lung Cancer Lung cancer screening is recommended for adults 46-41 years old who are at high risk for lung cancer because of a history of smoking. A yearly low-dose CT scan of the lungs is recommended if you:  Currently smoke.  Have a history of at least 30 pack-years of smoking and you currently smoke or have quit within the past 15 years. A pack-year is smoking an average of one pack of cigarettes per day for one year.  Yearly screening should:  Continue until it has been 15 years since you quit.  Stop if you develop a health problem that would prevent you from having lung cancer treatment.  Colorectal Cancer  This type of cancer can be detected and can often be prevented.  Routine colorectal cancer screening usually begins at age 89 and continues through age 52.  If you have risk factors for colon cancer, your health  care provider may recommend that you be screened at an earlier age.  If you have a family history of colorectal cancer, talk with your health care provider about genetic screening.  Your health care provider may also recommend using home test kits to check for hidden blood in your stool.  A small camera at the end of a tube can be used to examine your colon directly (sigmoidoscopy or colonoscopy). This is done to check for the earliest forms of colorectal cancer.  Direct examination of the colon should be repeated  every 5-10 years until age 48. However, if early forms of precancerous polyps or small growths are found or if you have a family history or genetic risk for colorectal cancer, you may need to be screened more often.  Skin Cancer  Check your skin from head to toe regularly.  Monitor any moles. Be sure to tell your health care provider: ? About any new moles or changes in moles, especially if there is a change in a mole's shape or color. ? If you have a mole that is larger than the size of a pencil eraser.  If any of your family members has a history of skin cancer, especially at a young age, talk with your health care provider about genetic screening.  Always use sunscreen. Apply sunscreen liberally and repeatedly throughout the day.  Whenever you are outside, protect yourself by wearing long sleeves, pants, a wide-brimmed hat, and sunglasses.  What should I know about osteoporosis? Osteoporosis is a condition in which bone destruction happens more quickly than new bone creation. After menopause, you may be at an increased risk for osteoporosis. To help prevent osteoporosis or the bone fractures that can happen because of osteoporosis, the following is recommended:  If you are 5-12 years old, get at least 1,000 mg of calcium and at least 600 mg of vitamin D per day.  If you are older than age 60 but younger than age 50, get at least 1,200 mg of calcium and at least 600 mg of  vitamin D per day.  If you are older than age 24, get at least 1,200 mg of calcium and at least 800 mg of vitamin D per day.  Smoking and excessive alcohol intake increase the risk of osteoporosis. Eat foods that are rich in calcium and vitamin D, and do weight-bearing exercises several times each week as directed by your health care provider. What should I know about how menopause affects my mental health? Depression may occur at any age, but it is more common as you become older. Common symptoms of depression include:  Low or sad mood.  Changes in sleep patterns.  Changes in appetite or eating patterns.  Feeling an overall lack of motivation or enjoyment of activities that you previously enjoyed.  Frequent crying spells.  Talk with your health care provider if you think that you are experiencing depression. What should I know about immunizations? It is important that you get and maintain your immunizations. These include:  Tetanus, diphtheria, and pertussis (Tdap) booster vaccine.  Influenza every year before the flu season begins.  Pneumonia vaccine.  Shingles vaccine.  Your health care provider may also recommend other immunizations. This information is not intended to replace advice given to you by your health care provider. Make sure you discuss any questions you have with your health care provider. Document Released: 09/05/2005 Document Revised: 02/01/2016 Document Reviewed: 04/17/2015 Elsevier Interactive Patient Education  2018 [employer].

## 2018-02-03 DIAGNOSIS — G35 Multiple sclerosis: Secondary | ICD-10-CM | POA: Diagnosis not present

## 2018-02-03 DIAGNOSIS — R300 Dysuria: Secondary | ICD-10-CM | POA: Diagnosis not present

## 2018-02-03 DIAGNOSIS — N3000 Acute cystitis without hematuria: Secondary | ICD-10-CM | POA: Diagnosis not present

## 2018-02-03 DIAGNOSIS — R8271 Bacteriuria: Secondary | ICD-10-CM | POA: Diagnosis not present

## 2018-02-16 DIAGNOSIS — R339 Retention of urine, unspecified: Secondary | ICD-10-CM | POA: Diagnosis not present

## 2018-03-02 DIAGNOSIS — Z79899 Other long term (current) drug therapy: Secondary | ICD-10-CM | POA: Diagnosis not present

## 2018-03-02 DIAGNOSIS — G35 Multiple sclerosis: Secondary | ICD-10-CM | POA: Diagnosis not present

## 2018-03-02 DIAGNOSIS — M79601 Pain in right arm: Secondary | ICD-10-CM | POA: Diagnosis not present

## 2018-03-12 DIAGNOSIS — N3020 Other chronic cystitis without hematuria: Secondary | ICD-10-CM | POA: Diagnosis not present

## 2018-03-19 DIAGNOSIS — N3020 Other chronic cystitis without hematuria: Secondary | ICD-10-CM | POA: Diagnosis not present

## 2018-03-30 DIAGNOSIS — M79601 Pain in right arm: Secondary | ICD-10-CM | POA: Diagnosis not present

## 2018-03-30 DIAGNOSIS — Z79899 Other long term (current) drug therapy: Secondary | ICD-10-CM | POA: Diagnosis not present

## 2018-03-30 DIAGNOSIS — G35 Multiple sclerosis: Secondary | ICD-10-CM | POA: Diagnosis not present

## 2018-04-07 DIAGNOSIS — M79601 Pain in right arm: Secondary | ICD-10-CM | POA: Diagnosis not present

## 2018-04-07 DIAGNOSIS — G35 Multiple sclerosis: Secondary | ICD-10-CM | POA: Diagnosis not present

## 2018-04-26 DIAGNOSIS — G35 Multiple sclerosis: Secondary | ICD-10-CM | POA: Diagnosis not present

## 2018-04-26 DIAGNOSIS — N3020 Other chronic cystitis without hematuria: Secondary | ICD-10-CM | POA: Diagnosis not present

## 2018-04-27 DIAGNOSIS — R339 Retention of urine, unspecified: Secondary | ICD-10-CM | POA: Diagnosis not present

## 2018-04-28 DIAGNOSIS — G35 Multiple sclerosis: Secondary | ICD-10-CM | POA: Diagnosis not present

## 2018-04-28 DIAGNOSIS — L814 Other melanin hyperpigmentation: Secondary | ICD-10-CM | POA: Diagnosis not present

## 2018-04-28 DIAGNOSIS — Z79899 Other long term (current) drug therapy: Secondary | ICD-10-CM | POA: Diagnosis not present

## 2018-04-28 DIAGNOSIS — L821 Other seborrheic keratosis: Secondary | ICD-10-CM | POA: Diagnosis not present

## 2018-04-28 DIAGNOSIS — D1801 Hemangioma of skin and subcutaneous tissue: Secondary | ICD-10-CM | POA: Diagnosis not present

## 2018-04-28 DIAGNOSIS — L570 Actinic keratosis: Secondary | ICD-10-CM | POA: Diagnosis not present

## 2018-05-25 DIAGNOSIS — G35 Multiple sclerosis: Secondary | ICD-10-CM | POA: Diagnosis not present

## 2018-05-25 DIAGNOSIS — M79601 Pain in right arm: Secondary | ICD-10-CM | POA: Diagnosis not present

## 2018-05-25 DIAGNOSIS — Z79899 Other long term (current) drug therapy: Secondary | ICD-10-CM | POA: Diagnosis not present

## 2018-05-31 DIAGNOSIS — Z13820 Encounter for screening for osteoporosis: Secondary | ICD-10-CM | POA: Diagnosis not present

## 2018-05-31 NOTE — Progress Notes (Signed)
Chief Complaint  Patient presents with  . Annual Exam    questions about vaccines (tdap) / referral to Urologist.    HPI: [name] 62 y.o. comes in today for Preventive Medicare exam/ wellness visit .Since last visit. has VITAMIN D DEFICIENCY; DISORDER, ADJUSTMENT W/DEPRESSED MOOD; SCLEROSIS, MULTIPLE; HYPERTENSION; VARICOSE VEIN, LWR EXTREMITIES W/INFLAMMATION; VASOMOTOR RHINITIS; Constipation; MUSCLE SPASM; DIZZINESS; HYPOKALEMIA, HX OF; Hypertriglyceridemia; Voiding dysfunction; GERD (gastroesophageal reflux disease); Dysphagia, pharyngoesophageal; Suprapubic tenderness; Recurrent UTI; Family hx osteoporosis; Postmenopausal estrogen deficiency; Visit for preventive health examination; Nausea alone; Other malaise and fatigue; Hx of falling; Rectal bleeding; and Internal hemorrhoids on their problem list.  Taking vit d supp ? How much ? 3 x 1500?   Urologist retired Chemical engineer one ok but not as responsive to her uti sx .   [place] bout tdap  Has grandchild has  Had tdap 3 years ago .  Mood better since kids out of the hours .   No major progression with ms uses cane and walker  Left leg foot weakness   taking mobic rot right arm tendinitis   For the past 6 weeks   Health Maintenance  Topic Date Due  . MAMMOGRAM  02/03/2020  . PAP SMEAR  02/02/2021  . COLONOSCOPY  01/09/2025  . TETANUS/TDAP  07/11/2025  . INFLUENZA VACCINE  Completed  . Hepatitis C Screening  Discontinued  . HIV Screening  Discontinued   Health Maintenance Review LIFESTYLE:  Exercise:   Cane walker   Trip a lot .  Been a while. Tobacco/ETS: no Alcohol:  Wine  With dinner   2 glasses  Sugar beverages: not much Sleep:    About 8-9  Drug use: no HH:2 2 cats    Hearing: ok   Vision:  No limitations at present . Last eye check UTD  Safety:  Has smoke detector and wears seat belts.  No firearms. No excess sun exposure. Sees dentist regularly.  Falls: not recnet  Memory: not sure   That great.      Depression: No anhedonia unusual crying or depressive symptoms  Nutrition: Eats well balanced diet; adequate calcium and vitamin D. No swallowing chewing problems.  Injury: no major injuries in the last six months.  Other healthcare providers:  Reviewed today .  Preventive parameters: up-to-date  Reviewed   ADLS:   There are no problems  mobiloity  or need for assistance  driving, feeding, obtaining food, dressing, toileting and bathing, managing money using phone. She is independent.     ROS:  GEN/ HEENT: No fever, significant weight changes sweats headaches vision problems hearing changes, CV/ PULM; No chest pain shortness of breath cough, syncope,edema  change in exercise tolerance. GI /GU: No adominal pain, vomiting, change in bowel habits. No blood in the stool. No significant GU symptoms. SKIN/HEME: ,no acute skin rashes suspicious lesions or bleeding. No lymphadenopathy, nodules, masses.  NEURO/ PSYCH:  No neurologic signs such as weakness numbness. No depression anxiety. IMM/ Allergy: No unusual infections.  Allergy .   REST of 12 system review negative except as per HPI   Past Medical History:  Diagnosis Date  . Anemia   . Blood in stool, frank 12/01/2011  . Depressive reaction   . Diverticulitis   . Diverticulosis   . Frequent UTI LAST 7/13  . 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 Mood disorder ([place])   . MS (multiple sclerosis) ([place])   . Rhinitis   . Superficial phlebitis   . Varicose veins  Family History  Problem Relation Age of Onset  . Lung cancer Father   . Colon cancer Neg Hx     Social History   Socioeconomic History  . Marital status: Married    Spouse name: Not on file  . Number of children: 4  . Years of education: Not on file  . Highest education level: Not on file  Occupational History  . Occupation: Disability  Social Needs  . Financial resource strain: Not on file  . Food insecurity:    Worry: Not on file    Inability: Not on file   . Transportation needs:    Medical: Not on file    Non-medical: Not on file  Tobacco Use  . Smoking status: Never Smoker  . Smokeless tobacco: Never Used  Substance and Sexual Activity  . Alcohol use: Yes    Alcohol/week: 0.0 standard drinks    Comment: occ  . Drug use: No  . Sexual activity: Not on file  Lifestyle  . Physical activity:    Days per week: Not on file    Minutes per session: Not on file  . Stress: Not on file  Relationships  . Social connections:    Talks on phone: Not on file    Gets together: Not on file    Attends religious service: Not on file    Active member of club or organization: Not on file    Attends meetings of clubs or organizations: Not on file    Relationship status: Not on file  Other Topics Concern  . Not on file  Social History Narrative   Unable to volunteer    Now on disability   Children now out of home   Daily caffeine     Outpatient Encounter Medications as of 06/01/2018  Medication Sig  . Cholecalciferol (VITAMIN D-3) 5000 UNITS TABS Take 1 tablet by mouth daily.  . DULoxetine (CYMBALTA) 30 MG capsule Take 1 cap daily x 1wk, then 2 caps daily thereafter.  . esomeprazole (NEXIUM) 20 MG capsule Take 20-40 mg by mouth daily as needed (acid reflux).  . gabapentin (NEURONTIN) 300 MG capsule Take by mouth 1 day or 1 dose.  . meloxicam (MOBIC) 15 MG tablet TAKE ONE TABLET BY MOUTH DAILY  . Meth-Hyo-M Bl-Na Phos-Ph Sal (URIBEL) 118 MG CAPS Take 1 capsule by mouth daily as needed (bladder spasms). Using PRN  . natalizumab (TYSABRI) 300 MG/15ML injection Inject into the vein every 30 (thirty) days.   . Probiotic Product (PROBIOTIC PEARLS) CAPS Take 1 capsule by mouth daily.  Marland Kitchen tiZANidine (ZANAFLEX) 4 MG tablet Take 8 mg by mouth at bedtime.   Marland Kitchen trimethoprim (TRIMPEX) 100 MG tablet Take 100 mg by mouth 2 (two) times daily.  . [DISCONTINUED] buPROPion (WELLBUTRIN XL) 300 MG 24 hr tablet Take 600 mg by mouth daily.   . [DISCONTINUED] etodolac  (LODINE) 200 MG capsule Take 200 mg by mouth daily.  . [DISCONTINUED] Loratadine (CLARITIN PO) Take by mouth.  . [DISCONTINUED] terbinafine (LAMISIL) 250 MG tablet Take 1 tablet (250 mg total) by mouth daily. (Patient not taking: Reported on 02/02/2018)   No facility-administered encounter medications on file as of 06/01/2018.     EXAM:  BP 140/90 (BP Location: Right Arm, Patient Position: Sitting, Cuff Size: Normal)   Pulse 83   Temp 98.2 F (36.8 C) (Oral)   Ht 5' 5.25" (1.657 m)   Wt 168 lb 9.6 oz (76.5 kg)   BMI 27.84 kg/m   Body  mass index is 27.84 kg/m.  Physical Exam: Vital signs reviewed walks w cane  Weakness  In lef foot  SWN:IOEV is a well-developed well-nourished alert cooperative   who appears stated age in no acute distress.  HEENT: normocephalic atraumatic , Eyes: PERRL EOM's full, conjunctiva clear, Nares: paten,t no deformity discharge or tenderness., Ears: no deformity EAC's clear TMs with normal landmarks. Mouth: clear OP, no lesions, edema.  Moist mucous membranes. Dentition in adequate repair. NECK: supple without masses, thyromegaly or bruits. CHEST/PULM:  Clear to auscultation and percussion breath sounds equal no wheeze , rales or rhonchi. No chest wall deformities or tenderness. CV: PMI is nondisplaced, S1 S2 no gallops, murmurs, rubs. Peripheral pulses are  without delay.No JVD .  ABDOMEN: Bowel sounds normal nontender  No guard or rebound, no hepato splenomegal no CVA tenderness.   Extremtities:  No clubbing cyanosis or edema, no acute joint swelling or redness NEURO:  Oriented x3, cranial nerves 3-12 appear to be intact, uneven gait but steady  Using cane    Favoring left foot le SKIN: No acute rashes normal turgor, color, no bruising or petechiae. PSYCH: Oriented, good eye contact, no obvious depression anxiety, cognition and judgment appear normal. LN: no cervical axillary inguinal adenopathy No noted deficits in memory, attention, and speech.   Lab  Results  Component Value Date   WBC 8.6 06/01/2018   HGB 13.2 06/01/2018   HCT 38.3 06/01/2018   PLT 271.0 06/01/2018   GLUCOSE 101 (H) 06/01/2018   CHOL 194 06/01/2018   TRIG 113.0 06/01/2018   HDL 64.10 06/01/2018   LDLDIRECT 110.0 04/23/2015   LDLCALC 107 (H) 06/01/2018   ALT 27 06/01/2018   AST 20 06/01/2018   NA 140 06/01/2018   K 4.4 06/01/2018   CL 102 06/01/2018   CREATININE 0.74 06/01/2018   BUN 17 06/01/2018   CO2 30 06/01/2018   TSH 1.01 04/27/2017   HGBA1C 5.2 02/13/2014  ate 11 30 tenderloin on toast.   And water.   ASSESSMENT AND PLAN:  Discussed the following assessment and plan:  Visit for preventive health examination  Medication management - Plan: Lipid panel, Basic metabolic panel, CBC with Differential/Platelet, Hepatic function panel, VITAMIN D 25 Hydroxy (Vit-D Deficiency, Fractures)  Recurrent UTI - Plan: Lipid panel, Basic metabolic panel, CBC with Differential/Platelet, Hepatic function panel, VITAMIN D 25 Hydroxy (Vit-D Deficiency, Fractures)  SCLEROSIS, MULTIPLE - Plan: Lipid panel, Basic metabolic panel, CBC with Differential/Platelet, Hepatic function panel, VITAMIN D 25 Hydroxy (Vit-D Deficiency, Fractures)  High risk medication use - Plan: Lipid panel, Basic metabolic panel, CBC with Differential/Platelet, Hepatic function panel, VITAMIN D 25 Hydroxy (Vit-D Deficiency, Fractures)  Hyperlipidemia, unspecified hyperlipidemia type - Plan: Lipid panel, Basic metabolic panel, CBC with Differential/Platelet, Hepatic function panel, VITAMIN D 25 Hydroxy (Vit-D Deficiency, Fractures)  Vitamin D deficiency - Plan: Lipid panel, Basic metabolic panel, CBC with Differential/Platelet, Hepatic function panel, VITAMIN D 25 Hydroxy (Vit-D Deficiency, Fractures)  Elevated blood pressure reading - uncertain if ht  plan check readings bid for 7+ d she has wirst machine and will record second readings and get to [place] if not belwo 140/90 then rov to disc rx  mobic  helping tendinitis .  Monitor renal function  Patient Care Team: Sirr Kabel, [name], MD as PCP - General Konrad Penta, MD as Referring Physician (Obstetrics and Gynecology) Carolan Clines, MD as Attending Physician (Urology) Ceasar Mons, MD as Consulting Physician (Urology) Sydnee Levans, MD as Consulting Physician (Dermatology) Karl Luke, MD  as Referring Physician (Neurology)  Patient Instructions   Attention to fall prevention and good nutrition. Checking vit d level .  Usually take no more than 4000 iu per day .  Will notify you  of labs when available.  yearly flu vaccine  shingrix at [facility], Female Preventive care refers to lifestyle choices and visits with your health care provider that can promote health and wellness. What does preventive care include?  A yearly physical exam. This is also called an annual well check.  Dental exams once or twice a year.  Routine eye exams. Ask your health care provider how often you should have your eyes checked.  Personal lifestyle choices, including: ? Daily care of your teeth and gums. ? Regular physical activity. ? Eating a healthy diet. ? Avoiding tobacco and drug use. ? Limiting alcohol use. ? Practicing safe sex. ? Taking low-dose aspirin daily starting at age 27. ? Taking vitamin and mineral supplements as recommended by your health care provider. What happens during an annual well check? The services and screenings done by your health care provider during your annual well check will depend on your age, overall health, lifestyle risk factors, and family history of disease. Counseling Your health care provider may ask you questions about your:  Alcohol use.  Tobacco use.  Drug use.  Emotional well-being.  Home and relationship well-being.  Sexual activity.  Eating habits.  Work and work Statistician.  Method of birth control.  Menstrual  cycle.  Pregnancy history.  Screening You may have the following tests or measurements:  Height, weight, and BMI.  Blood pressure.  Lipid and cholesterol levels. These may be checked every 5 years, or more frequently if you are over 56 years old.  Skin check.  Lung cancer screening. You may have this screening every year starting at age 62 if you have a 30-pack-year history of smoking and currently smoke or have quit within the past 15 years.  Fecal occult blood test (FOBT) of the stool. You may have this test every year starting at age 50.  Flexible sigmoidoscopy or colonoscopy. You may have a sigmoidoscopy every 5 years or a colonoscopy every 10 years starting at age 79.  Hepatitis C blood test.  Hepatitis B blood test.  Sexually transmitted disease (STD) testing.  Diabetes screening. This is done by checking your blood sugar (glucose) after you have not eaten for a while (fasting). You may have this done every 1-3 years.  Mammogram. This may be done every 1-2 years. Talk to your health care provider about when you should start having regular mammograms. This may depend on whether you have a family history of breast cancer.  BRCA-related cancer screening. This may be done if you have a family history of breast, ovarian, tubal, or peritoneal cancers.  Pelvic exam and Pap test. This may be done every 3 years starting at age 35. Starting at age 57, this may be done every 5 years if you have a Pap test in combination with an HPV test.  Bone density scan. This is done to screen for osteoporosis. You may have this scan if you are at high risk for osteoporosis.  Discuss your test results, treatment options, and if necessary, the need for more tests with your health care provider. Vaccines Your health care provider may recommend certain vaccines, such as:  Influenza vaccine. This is recommended every year.  Tetanus, diphtheria, and acellular pertussis (Tdap, Td) vaccine.  You may  need a Td booster every 10 years.  Varicella vaccine. You may need this if you have not been vaccinated.  Zoster vaccine. You may need this after age 66.  Measles, mumps, and rubella (MMR) vaccine. You may need at least one dose of MMR if you were born in 1957 or later. You may also need a second dose.  Pneumococcal 13-valent conjugate (PCV13) vaccine. You may need this if you have certain conditions and were not previously vaccinated.  Pneumococcal polysaccharide (PPSV23) vaccine. You may need one or two doses if you smoke cigarettes or if you have certain conditions.  Meningococcal vaccine. You may need this if you have certain conditions.  Hepatitis A vaccine. You may need this if you have certain conditions or if you travel or work in places where you may be exposed to hepatitis A.  Hepatitis B vaccine. You may need this if you have certain conditions or if you travel or work in places where you may be exposed to hepatitis B.  Haemophilus influenzae type b (Hib) vaccine. You may need this if you have certain conditions.  Talk to your health care provider about which screenings and vaccines you need and how often you need them. This information is not intended to replace advice given to you by your health care provider. Make sure you discuss any questions you have with your health care provider. Document Released: 08/10/2015 Document Revised: 04/02/2016 Document Reviewed: 05/15/2015 Elsevier Interactive Patient Education  2018 [employer]. Melainie Krinsky M.D.

## 2018-06-01 ENCOUNTER — Encounter: Payer: Self-pay | Admitting: Internal Medicine

## 2018-06-01 ENCOUNTER — Ambulatory Visit (INDEPENDENT_AMBULATORY_CARE_PROVIDER_SITE_OTHER): Payer: Medicare Other | Admitting: Internal Medicine

## 2018-06-01 VITALS — BP 140/90 | HR 83 | Temp 98.2°F | Ht 65.25 in | Wt 168.6 lb

## 2018-06-01 DIAGNOSIS — Z0000 Encounter for general adult medical examination without abnormal findings: Secondary | ICD-10-CM | POA: Diagnosis not present

## 2018-06-01 DIAGNOSIS — N390 Urinary tract infection, site not specified: Secondary | ICD-10-CM

## 2018-06-01 DIAGNOSIS — E785 Hyperlipidemia, unspecified: Secondary | ICD-10-CM | POA: Diagnosis not present

## 2018-06-01 DIAGNOSIS — G35 Multiple sclerosis: Secondary | ICD-10-CM

## 2018-06-01 DIAGNOSIS — R339 Retention of urine, unspecified: Secondary | ICD-10-CM | POA: Diagnosis not present

## 2018-06-01 DIAGNOSIS — Z79899 Other long term (current) drug therapy: Secondary | ICD-10-CM

## 2018-06-01 DIAGNOSIS — R030 Elevated blood-pressure reading, without diagnosis of hypertension: Secondary | ICD-10-CM

## 2018-06-01 DIAGNOSIS — E559 Vitamin D deficiency, unspecified: Secondary | ICD-10-CM | POA: Diagnosis not present

## 2018-06-01 LAB — CBC WITH DIFFERENTIAL/PLATELET
BASOS PCT: 0.6 % (ref 0.0–3.0)
Basophils Absolute: 0.1 10*3/uL (ref 0.0–0.1)
EOS ABS: 0.4 10*3/uL (ref 0.0–0.7)
EOS PCT: 4.9 % (ref 0.0–5.0)
HEMATOCRIT: 38.3 % (ref 36.0–46.0)
Hemoglobin: 13.2 g/dL (ref 12.0–15.0)
Lymphocytes Relative: 33.2 % (ref 12.0–46.0)
Lymphs Abs: 2.8 10*3/uL (ref 0.7–4.0)
MCHC: 34.5 g/dL (ref 30.0–36.0)
MCV: 88.7 fl (ref 78.0–100.0)
MONO ABS: 0.8 10*3/uL (ref 0.1–1.0)
Monocytes Relative: 8.8 % (ref 3.0–12.0)
Neutro Abs: 4.5 10*3/uL (ref 1.4–7.7)
Neutrophils Relative %: 52.5 % (ref 43.0–77.0)
Platelets: 271 10*3/uL (ref 150.0–400.0)
RBC: 4.32 Mil/uL (ref 3.87–5.11)
RDW: 13.4 % (ref 11.5–15.5)
WBC: 8.6 10*3/uL (ref 4.0–10.5)

## 2018-06-01 LAB — LIPID PANEL
CHOLESTEROL: 194 mg/dL (ref 0–200)
HDL: 64.1 mg/dL (ref >39.00)
LDL Cholesterol: 107 mg/dL — ABNORMAL HIGH (ref 0–99)
NonHDL: 130.05
TRIGLYCERIDES: 113 mg/dL (ref 0.0–149.0)
Total CHOL/HDL Ratio: 3
VLDL: 22.6 mg/dL (ref 0.0–40.0)

## 2018-06-01 LAB — HEPATIC FUNCTION PANEL
ALT: 27 U/L (ref 0–35)
AST: 20 U/L (ref 0–37)
Albumin: 4.7 g/dL (ref 3.5–5.2)
Alkaline Phosphatase: 75 U/L (ref 39–117)
BILIRUBIN DIRECT: 0.1 mg/dL (ref 0.0–0.3)
BILIRUBIN TOTAL: 0.7 mg/dL (ref 0.2–1.2)
TOTAL PROTEIN: 6.8 g/dL (ref 6.0–8.3)

## 2018-06-01 LAB — BASIC METABOLIC PANEL
BUN: 17 mg/dL (ref 6–23)
CALCIUM: 9.8 mg/dL (ref 8.4–10.5)
CO2: 30 meq/L (ref 19–32)
Chloride: 102 mEq/L (ref 96–112)
Creatinine, Ser: 0.74 mg/dL (ref 0.40–1.20)
GFR: 83.95 mL/min (ref >60.00)
GLUCOSE: 101 mg/dL — AB (ref 70–99)
POTASSIUM: 4.4 meq/L (ref 3.5–5.1)
SODIUM: 140 meq/L (ref 135–145)

## 2018-06-01 LAB — VITAMIN D 25 HYDROXY (VIT D DEFICIENCY, FRACTURES): VITD: 69.39 ng/mL (ref 30.00–100.00)

## 2018-06-01 NOTE — Patient Instructions (Addendum)
Attention to fall prevention and good nutrition. Checking vit d level .  Usually take no more than 4000 iu per day .  Will notify you  of labs when available.  yearly flu vaccine  shingrix at [place] [facility], Female Preventive care refers to lifestyle choices and visits with your health care provider that can promote health and wellness. What does preventive care include?  A yearly physical exam. This is also called an annual well check.  Dental exams once or twice a year.  Routine eye exams. Ask your health care provider how often you should have your eyes checked.  Personal lifestyle choices, including: ? Daily care of your teeth and gums. ? Regular physical activity. ? Eating a healthy diet. ? Avoiding tobacco and drug use. ? Limiting alcohol use. ? Practicing safe sex. ? Taking low-dose aspirin daily starting at age 49. ? Taking vitamin and mineral supplements as recommended by your health care provider. What happens during an annual well check? The services and screenings done by your health care provider during your annual well check will depend on your age, overall health, lifestyle risk factors, and family history of disease. Counseling Your health care provider may ask you questions about your:  Alcohol use.  Tobacco use.  Drug use.  Emotional well-being.  Home and relationship well-being.  Sexual activity.  Eating habits.  Work and work Statistician.  Method of birth control.  Menstrual cycle.  Pregnancy history.  Screening You may have the following tests or measurements:  Height, weight, and BMI.  Blood pressure.  Lipid and cholesterol levels. These may be checked every 5 years, or more frequently if you are over 53 years old.  Skin check.  Lung cancer screening. You may have this screening every year starting at age 48 if you have a 30-pack-year history of smoking and currently smoke or have quit within the past  15 years.  Fecal occult blood test (FOBT) of the stool. You may have this test every year starting at age 39.  Flexible sigmoidoscopy or colonoscopy. You may have a sigmoidoscopy every 5 years or a colonoscopy every 10 years starting at age 58.  Hepatitis C blood test.  Hepatitis B blood test.  Sexually transmitted disease (STD) testing.  Diabetes screening. This is done by checking your blood sugar (glucose) after you have not eaten for a while (fasting). You may have this done every 1-3 years.  Mammogram. This may be done every 1-2 years. Talk to your health care provider about when you should start having regular mammograms. This may depend on whether you have a family history of breast cancer.  BRCA-related cancer screening. This may be done if you have a family history of breast, ovarian, tubal, or peritoneal cancers.  Pelvic exam and Pap test. This may be done every 3 years starting at age 68. Starting at age 69, this may be done every 5 years if you have a Pap test in combination with an HPV test.  Bone density scan. This is done to screen for osteoporosis. You may have this scan if you are at high risk for osteoporosis.  Discuss your test results, treatment options, and if necessary, the need for more tests with your health care provider. Vaccines Your health care provider may recommend certain vaccines, such as:  Influenza vaccine. This is recommended every year.  Tetanus, diphtheria, and acellular pertussis (Tdap, Td) vaccine. You may need a Td booster every 10 years.  Varicella vaccine. You may need this if you have not been vaccinated.  Zoster vaccine. You may need this after age 29.  Measles, mumps, and rubella (MMR) vaccine. You may need at least one dose of MMR if you were born in 1957 or later. You may also need a second dose.  Pneumococcal 13-valent conjugate (PCV13) vaccine. You may need this if you have certain conditions and were not previously  vaccinated.  Pneumococcal polysaccharide (PPSV23) vaccine. You may need one or two doses if you smoke cigarettes or if you have certain conditions.  Meningococcal vaccine. You may need this if you have certain conditions.  Hepatitis A vaccine. You may need this if you have certain conditions or if you travel or work in places where you may be exposed to hepatitis A.  Hepatitis B vaccine. You may need this if you have certain conditions or if you travel or work in places where you may be exposed to hepatitis B.  Haemophilus influenzae type b (Hib) vaccine. You may need this if you have certain conditions.  Talk to your health care provider about which screenings and vaccines you need and how often you need them. This information is not intended to replace advice given to you by your health care provider. Make sure you discuss any questions you have with your health care provider. Document Released: 08/10/2015 Document Revised: 04/02/2016 Document Reviewed: 05/15/2015 Elsevier Interactive Patient Education  [employer].

## 2018-06-02 DIAGNOSIS — M79601 Pain in right arm: Secondary | ICD-10-CM | POA: Diagnosis not present

## 2018-06-02 DIAGNOSIS — M7521 Bicipital tendinitis, right shoulder: Secondary | ICD-10-CM | POA: Diagnosis not present

## 2018-06-21 DIAGNOSIS — Z780 Asymptomatic menopausal state: Secondary | ICD-10-CM | POA: Diagnosis not present

## 2018-06-21 DIAGNOSIS — Z1231 Encounter for screening mammogram for malignant neoplasm of breast: Secondary | ICD-10-CM | POA: Diagnosis not present

## 2018-06-21 DIAGNOSIS — M8589 Other specified disorders of bone density and structure, multiple sites: Secondary | ICD-10-CM | POA: Diagnosis not present

## 2018-06-22 DIAGNOSIS — G35 Multiple sclerosis: Secondary | ICD-10-CM | POA: Diagnosis not present

## 2018-06-22 DIAGNOSIS — Z79899 Other long term (current) drug therapy: Secondary | ICD-10-CM | POA: Diagnosis not present

## 2018-07-05 DIAGNOSIS — R339 Retention of urine, unspecified: Secondary | ICD-10-CM | POA: Diagnosis not present

## 2018-07-22 DIAGNOSIS — G35 Multiple sclerosis: Secondary | ICD-10-CM | POA: Diagnosis not present

## 2018-07-22 DIAGNOSIS — Z79899 Other long term (current) drug therapy: Secondary | ICD-10-CM | POA: Diagnosis not present

## 2018-08-02 DIAGNOSIS — R339 Retention of urine, unspecified: Secondary | ICD-10-CM | POA: Diagnosis not present

## 2018-08-19 DIAGNOSIS — G35 Multiple sclerosis: Secondary | ICD-10-CM | POA: Diagnosis not present

## 2018-09-09 DIAGNOSIS — R339 Retention of urine, unspecified: Secondary | ICD-10-CM | POA: Diagnosis not present

## 2018-09-13 DIAGNOSIS — H5201 Hypermetropia, right eye: Secondary | ICD-10-CM | POA: Diagnosis not present

## 2018-09-13 DIAGNOSIS — H2513 Age-related nuclear cataract, bilateral: Secondary | ICD-10-CM | POA: Diagnosis not present

## 2018-09-13 DIAGNOSIS — Z79899 Other long term (current) drug therapy: Secondary | ICD-10-CM | POA: Diagnosis not present

## 2018-09-21 DIAGNOSIS — G35 Multiple sclerosis: Secondary | ICD-10-CM | POA: Diagnosis not present

## 2018-09-24 ENCOUNTER — Ambulatory Visit: Payer: Medicare Other | Admitting: Internal Medicine

## 2018-09-26 NOTE — Progress Notes (Signed)
Chief Complaint  Patient presents with  . Hypertension    pt is here to follow up on blood pressure. pt states her blood pressure has been high around bed time according to her machine     HPI: [name] 63 y.o. come in for fu bp readings   Bp  Readings  high at infusions   Readings  138-148 /88 - 99  In am     146- 170/89-103 in pm  No cp sob falling syncope .   Now on mobic only prn Last cr was normal  Mom has had ht  Age 31 now and down to 27 pounds  Family members with stroke and chf .   ROS: See pertinent positives and negatives per HPI.  Past Medical History:  Diagnosis Date  . Anemia   . Blood in stool, frank 12/01/2011  . Depressive reaction   . Diverticulitis   . Diverticulosis   . Frequent UTI LAST 7/13  . 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 Mood disorder ([place])   . MS (multiple sclerosis) ([place])   . Rhinitis   . Superficial phlebitis   . Varicose veins     Family History  Problem Relation Age of Onset  . Lung cancer Father   . Colon cancer Neg Hx     Social History   Socioeconomic History  . Marital status: Married    Spouse name: Not on file  . Number of children: 4  . Years of education: Not on file  . Highest education level: Not on file  Occupational History  . Occupation: Disability  Social Needs  . Financial resource strain: Not on file  . Food insecurity:    Worry: Not on file    Inability: Not on file  . Transportation needs:    Medical: Not on file    Non-medical: Not on file  Tobacco Use  . Smoking status: Never Smoker  . Smokeless tobacco: Never Used  Substance and Sexual Activity  . Alcohol use: Yes    Alcohol/week: 0.0 standard drinks    Comment: occ  . Drug use: No  . Sexual activity: Not on file  Lifestyle  . Physical activity:    Days per week: Not on file    Minutes per session: Not on file  . Stress: Not on file  Relationships  . Social connections:    Talks on phone: Not on file    Gets together: Not on  file    Attends religious service: Not on file    Active member of club or organization: Not on file    Attends meetings of clubs or organizations: Not on file    Relationship status: Not on file  Other Topics Concern  . Not on file  Social History Narrative   Unable to volunteer    Now on disability   Children now out of home   Daily caffeine     Outpatient Medications Prior to Visit  Medication Sig Dispense Refill  . Cholecalciferol (VITAMIN D-3) 5000 UNITS TABS Take 1 tablet by mouth daily.    . DULoxetine (CYMBALTA) 30 MG capsule Take 1 cap daily x 1wk, then 2 caps daily thereafter.    . esomeprazole (NEXIUM) 20 MG capsule Take 20-40 mg by mouth daily as needed (acid reflux).    . gabapentin (NEURONTIN) 300 MG capsule Take by mouth 1 day or 1 dose.    . meloxicam (MOBIC) 15 MG tablet TAKE ONE TABLET BY  MOUTH DAILY 30 tablet 0  . Meth-Hyo-M Bl-Na Phos-Ph Sal (URIBEL) 118 MG CAPS Take 1 capsule by mouth daily as needed (bladder spasms). Using PRN    . natalizumab (TYSABRI) 300 MG/15ML injection Inject into the vein every 30 (thirty) days.     . Probiotic Product (PROBIOTIC PEARLS) CAPS Take 1 capsule by mouth daily.    Marland Kitchen tiZANidine (ZANAFLEX) 4 MG tablet Take 8 mg by mouth at bedtime. Take 3 by mouth    . trimethoprim (TRIMPEX) 100 MG tablet Take 100 mg by mouth 2 (two) times daily.    . cephALEXin (KEFLEX) 500 MG capsule      No facility-administered medications prior to visit.      EXAM:  BP 136/82 (BP Location: Right Arm, Patient Position: Sitting, Cuff Size: Normal)   Pulse 96   Temp 98.5 F (36.9 C) (Oral)   Wt 165 lb 14.4 oz (75.3 kg)   BMI 27.40 kg/m   Body mass index is 27.4 kg/m.  GENERAL: vitals reviewed and listed above, alert, oriented, appears well hydrated and in no acute distress ambulatory  HEENT: atraumatic, conjunctiva  clear, no obvious abnormalities on inspection of external nose and ears  NECK: no obvious masses on inspection palpation  LUNGS:  clear to auscultation bilaterally, no wheezes, rales or rhonchi, god air movement CV: HRRR, no clubbing cyanosis or  peripheral edema nl cap refill   PSYCH: pleasant and cooperative, no obvious depression or anxiety Lab Results  Component Value Date   WBC 8.6 06/01/2018   HGB 13.2 06/01/2018   HCT 38.3 06/01/2018   PLT 271.0 06/01/2018   GLUCOSE 101 (H) 06/01/2018   CHOL 194 06/01/2018   TRIG 113.0 06/01/2018   HDL 64.10 06/01/2018   LDLDIRECT 110.0 04/23/2015   LDLCALC 107 (H) 06/01/2018   ALT 27 06/01/2018   AST 20 06/01/2018   NA 140 06/01/2018   K 4.4 06/01/2018   CL 102 06/01/2018   CREATININE 0.74 06/01/2018   BUN 17 06/01/2018   CO2 30 06/01/2018   TSH 1.01 04/27/2017   HGBA1C 5.2 02/13/2014   BP Readings from Last 3 Encounters:  09/27/18 136/82  06/01/18 140/90  02/02/18 (!) 144/90   Wt Readings from Last 3 Encounters:  09/27/18 165 lb 14.4 oz (75.3 kg)  06/01/18 168 lb 9.6 oz (76.5 kg)  02/02/18 167 lb 8 oz (76 kg)   reviewed BP  readings    Am pm .  ASSESSMENT AND PLAN:  Discussed the following assessment and plan:  Essential hypertension  Medication management  SCLEROSIS, MULTIPLE Begin medication caution amlodipine  5 mg  And if  if too low can decrease dose 2.5  but based on some  readings and other bp seems consistently high .    Avoiding diuretics at this time cause of  Ms dx  And has urinary sx  Already. Plan rov in 2-3 months or as needed -Patient advised to return or notify health care team  if  new concerns arise.  Patient Instructions  Starting medication  For hypertension control.    Then plan rov in 2-3 months or as needed.  If bp too low we can decrease to 2.5 mg but doubt that will happen.    [name]. Matvey Llanas M.D.

## 2018-09-27 ENCOUNTER — Encounter: Payer: Self-pay | Admitting: Internal Medicine

## 2018-09-27 ENCOUNTER — Ambulatory Visit (INDEPENDENT_AMBULATORY_CARE_PROVIDER_SITE_OTHER): Payer: Medicare Other | Admitting: Internal Medicine

## 2018-09-27 VITALS — BP 136/82 | HR 96 | Temp 98.5°F | Wt 165.9 lb

## 2018-09-27 DIAGNOSIS — I10 Essential (primary) hypertension: Secondary | ICD-10-CM

## 2018-09-27 DIAGNOSIS — G35 Multiple sclerosis: Secondary | ICD-10-CM | POA: Diagnosis not present

## 2018-09-27 DIAGNOSIS — Z79899 Other long term (current) drug therapy: Secondary | ICD-10-CM

## 2018-09-27 MED ORDER — AMLODIPINE BESYLATE 5 MG PO TABS: 5.0000 mg | ORAL_TABLET | Freq: Every day | ORAL | 5 refills | Status: DC

## 2018-09-27 NOTE — Patient Instructions (Addendum)
Starting medication  For hypertension control.    Then plan rov in 2-3 months or as needed.  If bp too low we can decrease to 2.5 mg but doubt that will happen.

## 2018-09-29 DIAGNOSIS — L570 Actinic keratosis: Secondary | ICD-10-CM | POA: Diagnosis not present

## 2018-10-01 ENCOUNTER — Encounter: Payer: Self-pay | Admitting: Internal Medicine

## 2018-10-01 ENCOUNTER — Ambulatory Visit (INDEPENDENT_AMBULATORY_CARE_PROVIDER_SITE_OTHER): Payer: Medicare Other | Admitting: Internal Medicine

## 2018-10-01 VITALS — BP 134/76 | HR 87 | Temp 98.5°F | Wt 166.5 lb

## 2018-10-01 DIAGNOSIS — J0100 Acute maxillary sinusitis, unspecified: Secondary | ICD-10-CM

## 2018-10-01 DIAGNOSIS — J069 Acute upper respiratory infection, unspecified: Secondary | ICD-10-CM

## 2018-10-01 DIAGNOSIS — N390 Urinary tract infection, site not specified: Secondary | ICD-10-CM

## 2018-10-01 DIAGNOSIS — G35 Multiple sclerosis: Secondary | ICD-10-CM

## 2018-10-01 LAB — POCT URINALYSIS DIPSTICK
Bilirubin, UA: NEGATIVE
Blood, UA: NEGATIVE
Glucose, UA: NEGATIVE
Ketones, UA: NEGATIVE
Nitrite, UA: NEGATIVE
Protein, UA: NEGATIVE
Spec Grav, UA: 1.01 (ref 1.010–1.025)
Urobilinogen, UA: 0.2 E.U./dL
pH, UA: 6.5 (ref 5.0–8.0)

## 2018-10-01 MED ORDER — HYDROCODONE-HOMATROPINE 5-1.5 MG/5ML PO SYRP: 5.0000 mL | ORAL_SOLUTION | Freq: Three times a day (TID) | ORAL | 0 refills | Status: DC | PRN

## 2018-10-01 MED ORDER — CEFUROXIME AXETIL 500 MG PO TABS: 500.0000 mg | ORAL_TABLET | Freq: Two times a day (BID) | ORAL | 0 refills | Status: DC

## 2018-10-01 NOTE — Patient Instructions (Addendum)
Weill send in cough med hydrocodone for comfort as discussed .   Add saline and flonase back  And if sinus  Sx not improving plan    Add ing antibiotic   Trying to pick an antibiotic that is good for sinus bacteria and urine bacteria .  Urine culture pending

## 2018-10-01 NOTE — Progress Notes (Signed)
Chief Complaint  Patient presents with  . Cough    x6 days. Pt states she ahs had body aches and drainage. Productive cough pt states at night pt does not know color due to ut being in the middle of the night. Pt states that cough is mostly at night   . Dysuria    pt states painful urination started yesterday and pt states that she has been having trouble going the last couple days.     HPI: [name] 63 y.o. come in for sda   For 2 issues   Sees above  Ur sx and also  Face pressure  Cough  And poor sleep otherwise.   No sob but cough keeping her from sleep   1-2 days ago    ? Urinary tract sx caths 3 x per day no flank pain hematuria  On keflex for porphylaxis  And last uti a year ago  ususally get different med if needed for pro[hylaxis  Last uti was about a  Year ago ROS: See pertinent positives and negatives per HPI. No falling Has to attend funeral and go to [place] mom is declining in health No vomiting abd pain  Past Medical History:  Diagnosis Date  . Anemia   . Blood in stool, frank 12/01/2011  . Depressive reaction   . Diverticulitis   . Diverticulosis   . Frequent UTI LAST 7/13  . 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 Mood disorder ([place])   . MS (multiple sclerosis) ([place])   . Rhinitis   . Superficial phlebitis   . Varicose veins     Family History  Problem Relation Age of Onset  . Lung cancer Father   . Colon cancer Neg Hx     Social History   Socioeconomic History  . Marital status: Married    Spouse name: Not on file  . Number of children: 4  . Years of education: Not on file  . Highest education level: Not on file  Occupational History  . Occupation: Disability  Social Needs  . Financial resource strain: Not on file  . Food insecurity:    Worry: Not on file    Inability: Not on file  . Transportation needs:    Medical: Not on file    Non-medical: Not on file  Tobacco Use  . Smoking status: Never Smoker  . Smokeless tobacco: Never Used    Substance and Sexual Activity  . Alcohol use: Yes    Alcohol/week: 0.0 standard drinks    Comment: occ  . Drug use: No  . Sexual activity: Not on file  Lifestyle  . Physical activity:    Days per week: Not on file    Minutes per session: Not on file  . Stress: Not on file  Relationships  . Social connections:    Talks on phone: Not on file    Gets together: Not on file    Attends religious service: Not on file    Active member of club or organization: Not on file    Attends meetings of clubs or organizations: Not on file    Relationship status: Not on file  Other Topics Concern  . Not on file  Social History Narrative   Unable to volunteer    Now on disability   Children now out of home   Daily caffeine     Outpatient Medications Prior to Visit  Medication Sig Dispense Refill  . amLODipine (NORVASC) 5 MG tablet  Take 1 tablet (5 mg total) by mouth daily. 30 tablet 5  . cephALEXin (KEFLEX) 500 MG capsule     . Cholecalciferol (VITAMIN D-3) 5000 UNITS TABS Take 1 tablet by mouth daily.    . DULoxetine (CYMBALTA) 30 MG capsule Take 1 cap daily x 1wk, then 2 caps daily thereafter.    . esomeprazole (NEXIUM) 20 MG capsule Take 20-40 mg by mouth daily as needed (acid reflux).    . gabapentin (NEURONTIN) 300 MG capsule Take by mouth 1 day or 1 dose.    . meloxicam (MOBIC) 15 MG tablet TAKE ONE TABLET BY MOUTH DAILY 30 tablet 0  . Meth-Hyo-M Bl-Na Phos-Ph Sal (URIBEL) 118 MG CAPS Take 1 capsule by mouth daily as needed (bladder spasms). Using PRN    . natalizumab (TYSABRI) 300 MG/15ML injection Inject into the vein every 30 (thirty) days.     . Probiotic Product (PROBIOTIC PEARLS) CAPS Take 1 capsule by mouth daily.    Marland Kitchen tiZANidine (ZANAFLEX) 4 MG tablet Take 8 mg by mouth at bedtime. Take 3 by mouth     No facility-administered medications prior to visit.      EXAM:  BP 134/76 (BP Location: Right Arm, Patient Position: Sitting, Cuff Size: Normal)   Pulse 87   Temp 98.5 F  (36.9 C) (Oral)   Wt 166 lb 8 oz (75.5 kg)   SpO2 95%   BMI 27.50 kg/m   Body mass index is 27.5 kg/m.  GENERAL: vitals reviewed and listed above, alert, oriented, appears well hydrated and in no acute distress mild ill non toxic  HEENT: atraumatic, conjunctiva  clear, no obvious abnormalities on inspection of external nose and earstm clare face midl tenderness  And mucoid nasal dc  OP : no lesion edema or exudate  NECK: no obvious masses on inspection palpation  LUNGS: clear to auscultation bilaterally, no wheezes, rales or rhonchi, good air movement CV: HRRR, no clubbing cyanosis or  peripheral edema nl cap refill  MS: moves all extremities ambulalorry but help getting onto table  Abdomen:  Sof,t normal bowel sounds without hepatosplenomegaly, no guarding rebound or masses no CVA tenderness PSYCH: pleasant and cooperative, no obvious depression or anxiety Lab Results  Component Value Date   WBC 8.6 06/01/2018   HGB 13.2 06/01/2018   HCT 38.3 06/01/2018   PLT 271.0 06/01/2018   GLUCOSE 101 (H) 06/01/2018   CHOL 194 06/01/2018   TRIG 113.0 06/01/2018   HDL 64.10 06/01/2018   LDLDIRECT 110.0 04/23/2015   LDLCALC 107 (H) 06/01/2018   ALT 27 06/01/2018   AST 20 06/01/2018   NA 140 06/01/2018   K 4.4 06/01/2018   CL 102 06/01/2018   CREATININE 0.74 06/01/2018   BUN 17 06/01/2018   CO2 30 06/01/2018   TSH 1.01 04/27/2017   HGBA1C 5.2 02/13/2014   BP Readings from Last 3 Encounters:  10/01/18 134/76  09/27/18 136/82  06/01/18 140/90   UA 3+ leuk ASSESSMENT AND PLAN:  Discussed the following assessment and plan:  Recurrent UTI - Plan: POCT urinalysis dipstick, Culture, Urine  Acute upper respiratory infection of multiple sites  Acute maxillary sinusitis, recurrence not specified  SCLEROSIS, MULTIPLE prob uti  On prophylaxis and respira infection  Poss secondary sinusitis   Maybe viral  Antibiotic that may help both bact sinusitis and  uti  Albeit same drug group  extended spectrum  Await culture  Cough med ( not allergic just itching) for comfort care   And other measures  Counseled.  -Patient advised to return or notify health care team  if  new concerns arise.  Patient Instructions  Loraine Leriche send in cough med hydrocodone for comfort as discussed .   Add saline and flonase back  And if sinus  Sx not improving plan    Add ing antibiotic   Trying to pick an antibiotic that is good for sinus bacteria and urine bacteria .  Urine culture pending     [name]. Tressie Ragin M.D.

## 2018-10-05 DIAGNOSIS — R339 Retention of urine, unspecified: Secondary | ICD-10-CM | POA: Diagnosis not present

## 2018-10-06 ENCOUNTER — Telehealth: Payer: Self-pay

## 2018-10-06 ENCOUNTER — Other Ambulatory Visit: Payer: Self-pay

## 2018-10-06 LAB — URINE CULTURE
MICRO NUMBER:: 287394
SPECIMEN QUALITY:: ADEQUATE

## 2018-10-06 MED ORDER — SULFAMETHOXAZOLE-TRIMETHOPRIM 800-160 MG PO TABS: 1.0000 | ORAL_TABLET | Freq: Two times a day (BID) | ORAL | 0 refills | Status: AC

## 2018-10-06 NOTE — Telephone Encounter (Signed)
Copied from [place] [id number]. Topic: Quick Communication - See Telephone Encounter >> Oct 06, 2018 11:34 AM Bea Graff, NT wrote: CRM for notification. See Telephone encounter for: 10/06/18. Pt requesting a call to discuss her lab results.

## 2018-10-11 DIAGNOSIS — N3000 Acute cystitis without hematuria: Secondary | ICD-10-CM | POA: Diagnosis not present

## 2018-10-19 DIAGNOSIS — G35 Multiple sclerosis: Secondary | ICD-10-CM | POA: Diagnosis not present

## 2018-11-08 DIAGNOSIS — R339 Retention of urine, unspecified: Secondary | ICD-10-CM | POA: Diagnosis not present

## 2018-11-18 DIAGNOSIS — R2689 Other abnormalities of gait and mobility: Secondary | ICD-10-CM | POA: Diagnosis not present

## 2018-11-18 DIAGNOSIS — M79601 Pain in right arm: Secondary | ICD-10-CM | POA: Diagnosis not present

## 2018-11-18 DIAGNOSIS — G43009 Migraine without aura, not intractable, without status migrainosus: Secondary | ICD-10-CM | POA: Diagnosis not present

## 2018-11-18 DIAGNOSIS — G35 Multiple sclerosis: Secondary | ICD-10-CM | POA: Diagnosis not present

## 2018-11-18 DIAGNOSIS — Z79899 Other long term (current) drug therapy: Secondary | ICD-10-CM | POA: Diagnosis not present

## 2018-12-21 DIAGNOSIS — Z79899 Other long term (current) drug therapy: Secondary | ICD-10-CM | POA: Diagnosis not present

## 2018-12-21 DIAGNOSIS — M79601 Pain in right arm: Secondary | ICD-10-CM | POA: Diagnosis not present

## 2018-12-21 DIAGNOSIS — G43909 Migraine, unspecified, not intractable, without status migrainosus: Secondary | ICD-10-CM | POA: Diagnosis not present

## 2018-12-21 DIAGNOSIS — G35 Multiple sclerosis: Secondary | ICD-10-CM | POA: Diagnosis not present

## 2018-12-23 DIAGNOSIS — R339 Retention of urine, unspecified: Secondary | ICD-10-CM | POA: Diagnosis not present

## 2019-01-07 ENCOUNTER — Other Ambulatory Visit: Payer: Medicare Other

## 2019-01-07 ENCOUNTER — Other Ambulatory Visit: Payer: Self-pay

## 2019-01-07 ENCOUNTER — Ambulatory Visit (INDEPENDENT_AMBULATORY_CARE_PROVIDER_SITE_OTHER): Payer: Medicare Other | Admitting: Family Medicine

## 2019-01-07 ENCOUNTER — Telehealth: Payer: Self-pay | Admitting: General Practice

## 2019-01-07 ENCOUNTER — Ambulatory Visit: Payer: Self-pay

## 2019-01-07 ENCOUNTER — Encounter: Payer: Self-pay | Admitting: Family Medicine

## 2019-01-07 DIAGNOSIS — Z20828 Contact with and (suspected) exposure to other viral communicable diseases: Secondary | ICD-10-CM

## 2019-01-07 DIAGNOSIS — R509 Fever, unspecified: Secondary | ICD-10-CM | POA: Diagnosis not present

## 2019-01-07 DIAGNOSIS — Z20822 Contact with and (suspected) exposure to covid-19: Secondary | ICD-10-CM

## 2019-01-07 DIAGNOSIS — R070 Pain in throat: Secondary | ICD-10-CM | POA: Diagnosis not present

## 2019-01-07 DIAGNOSIS — M791 Myalgia, unspecified site: Secondary | ICD-10-CM | POA: Diagnosis not present

## 2019-01-07 DIAGNOSIS — R6889 Other general symptoms and signs: Secondary | ICD-10-CM

## 2019-01-07 DIAGNOSIS — R51 Headache: Secondary | ICD-10-CM

## 2019-01-07 NOTE — Telephone Encounter (Signed)
Pt scheduled for virtual visit today.  

## 2019-01-07 NOTE — Telephone Encounter (Signed)
Pt called stating that she woke Tuesday with sinus drainage. Wednesday she developed fever  Headache and a sore throat. Temperature today 100.5. she has just traveled to relatives in [place]. No one was sick. Pt has history of MS and states she does have chest pressure but that she has this symptom with MS. Pt triaged for COVID-19. Care advice read to patient. Pt verbalized understanding of all instructions. Call transferred to office for scheduling.  Reason for Disposition . HIGH RISK patient (e.g., age > 37 years, diabetes, heart or lung disease, weak immune system)  Answer Assessment - Initial Assessment Questions 1. COVID-19 DIAGNOSIS: "Who made your Coronavirus (COVID-19) diagnosis?" "Was it confirmed by a positive lab test?" If not diagnosed by a HCP, ask "Are there lots of cases (community spread) where you live?" (See public health department website, if unsure)     [facility] 2. ONSET: "When did the COVID-19 symptoms start?"      Tuesday 3. WORST SYMPTOM: "What is your worst symptom?" (e.g., cough, fever, shortness of breath, muscle aches)     Sore throat and congestion  4. COUGH: "Do you have a cough?" If so, ask: "How bad is the cough?"      occasional 5. FEVER: "Do you have a fever?" If so, ask: "What is your temperature, how was it measured, and when did it start?"    100.5 6. RESPIRATORY STATUS: "Describe your breathing?" (e.g., shortness of breath, wheezing, unable to speak)     Tight chest  Has MS and is a symptom 7. BETTER-SAME-WORSE: "Are you getting better, staying the same or getting worse compared to yesterday?"  If getting worse, ask, "In what way?"    better 8. HIGH RISK DISEASE: "Do you have any chronic medical problems?" (e.g., asthma, heart or lung disease, weak immune system, etc.)     MS,  9. PREGNANCY: "Is there any chance you are pregnant?" "When was your last menstrual period?"     N/A 10. OTHER SYMPTOMS: "Do you have any other symptoms?"  (e.g., chills,  fatigue, headache, loss of smell or taste, muscle pain, sore throat)       Headache, fatigue,sore throat,muscle aches  Protocols used: CORONAVIRUS (COVID-19) DIAGNOSED OR SUSPECTED-A-AH

## 2019-01-07 NOTE — Telephone Encounter (Signed)
Pt has been scheduled for Covid-19 testing.   Scheduled with pt directly.  Pt was referred by: Alysia Penna MD

## 2019-01-07 NOTE — Addendum Note (Signed)
Addended by: Denman George on: 01/07/2019 02:15 PM   Modules accepted: Orders

## 2019-01-07 NOTE — Progress Notes (Signed)
Subjective:    Patient ID: [name], female    DOB: Jul 21, 1956, 63 y.o.   MRN: [id number]  HPI Virtual Visit via Video Note  I connected with the patient on 01/07/19 at 11:30 AM EDT by a video enabled telemedicine application and verified that I am speaking with the correct person using two identifiers.  Location patient: home Location provider:work or home office Persons participating in the virtual visit: patient, provider  I discussed the limitations of evaluation and management by telemedicine and the availability of in person appointments. The patient expressed understanding and agreed to proceed.   HPI: Here for 3 days of fever to 99.9 degrees, headache, stuffy nose, PND, ST, a dry cough, body aches, and fatigue. No lossof taste or smell, no NVD. She is taking Alka-Seltzer Plus and drinking fluids. 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 Her husband feels fine.    ROS: See pertinent positives and negatives per HPI.  Past Medical History:  Diagnosis Date  . Anemia   . Blood in stool, frank 12/01/2011  . Depressive reaction   . Diverticulitis   . Diverticulosis   . Frequent UTI LAST 7/13  . 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 Mood disorder ([place])   . MS (multiple sclerosis) ([place])   . Rhinitis   . Superficial phlebitis   . Varicose veins     Past Surgical History:  Procedure Laterality Date  . ABDOMINAL HYSTERECTOMY  2008  . BLADDER SURGERY  2008   Tack  . SHOULDER ARTHROSCOPY WITH ROTATOR CUFF REPAIR AND SUBACROMIAL DECOMPRESSION  06/01/2012   Procedure: SHOULDER ARTHROSCOPY WITH ROTATOR CUFF REPAIR AND SUBACROMIAL DECOMPRESSION;  Surgeon: Magnus Sinning, MD;  Location: [facility];  Service: Orthopedics;  Laterality: Left;  LEFT SHOULDER ARTHROSCOPY WITH SUBACROMIAL DECOMPRESION AND DEBRIDEMENT OF PARTIAL ROTATOR CUFF  TEAR  . TONSILLECTOMY AND ADENOIDECTOMY      Family History  Problem Relation Age of Onset  . Lung cancer Father   . Colon cancer Neg Hx      Current Outpatient Medications:  .  amLODipine (NORVASC) 5 MG tablet, Take 1 tablet (5 mg total) by mouth daily., Disp: 30 tablet, Rfl: 5 .  cefUROXime (CEFTIN) 500 MG tablet, Take 1 tablet (500 mg total) by mouth 2 (two) times daily. For sinusitis (Patient not taking: Reported on 01/07/2019), Disp: 14 tablet, Rfl: 0 .  cephALEXin (KEFLEX) 500 MG capsule, , Disp: , Rfl:  .  Cholecalciferol (VITAMIN D-3) 5000 UNITS TABS, Take 1 tablet by mouth daily., Disp: , Rfl:  .  DULoxetine (CYMBALTA) 30 MG capsule, Take 1 cap daily x 1wk, then 2 caps daily thereafter., Disp: , Rfl:  .  esomeprazole (NEXIUM) 20 MG capsule, Take 20-40 mg by mouth daily as needed (acid reflux)., Disp: , Rfl:  .  gabapentin (NEURONTIN) 300 MG capsule, Take by mouth 1 day or 1 dose., Disp: , Rfl:  .  HYDROcodone-homatropine (HYCODAN) 5-1.5 MG/5ML syrup, Take 5 mLs by mouth every 8 (eight) hours as needed for cough (at night)., Disp: 90 mL, Rfl: 0 .  meloxicam (MOBIC) 15 MG tablet, TAKE ONE TABLET BY MOUTH DAILY (Patient not taking: Reported on 01/07/2019), Disp: 30 tablet, Rfl: 0 .  Meth-Hyo-M Bl-Na Phos-Ph Sal (URIBEL) 118 MG CAPS, Take 1 capsule by mouth daily as needed (bladder spasms). Using PRN, Disp: ,  Rfl:  .  natalizumab (TYSABRI) 300 MG/15ML injection, Inject into the vein every 30 (thirty) days. , Disp: , Rfl:  .  Probiotic Product (PROBIOTIC PEARLS) CAPS, Take 1 capsule by mouth daily., Disp: , Rfl:  .  tiZANidine (ZANAFLEX) 4 MG tablet, Take 8 mg by mouth at bedtime. Take 3 by mouth, Disp: , Rfl:   EXAM:  VITALS per patient if applicable:  GENERAL: alert, oriented, appears well and in no acute distress  HEENT: atraumatic, conjunttiva clear, no obvious abnormalities on inspection of external nose and ears  NECK: normal movements of the head and neck  LUNGS: on  inspection no signs of respiratory distress, breathing rate appears normal, no obvious gross SOB, gasping or wheezing  CV: no obvious cyanosis  MS: moves all visible extremities without noticeable abnormality  PSYCH/NEURO: pleasant and cooperative, no obvious depression or anxiety, speech and thought processing grossly intact  ASSESSMENT AND PLAN: She has symptoms of a viral illness and this could be from a Covid-19 infection. I advised her to quarantined herself at home for at least 7 days and then for 3 days after all her symptoms have resolved. She should avoid coming into contact with any of her family members during this time. She will let us know if her symptoms worsen. I will also arrange for her to be tested for the Covid-19 virus.  Alysia Penna, MD  Discussed the following assessment and plan:  No diagnosis found.     I discussed the assessment and treatment plan with the patient. The patient was provided an opportunity to ask questions and all were answered. The patient agreed with the plan and demonstrated an understanding of the instructions.   The patient was advised to call back or seek an in-person evaluation if the symptoms worsen or if the condition fails to improve as anticipated.     Review of Systems     Objective:   Physical Exam        Assessment & Plan:

## 2019-01-07 NOTE — Telephone Encounter (Signed)
-----   Message from Elie Confer, [place] sent at 01/07/2019  1:15 PM EDT ----- Dr. Sarajane Jews would like to have the patient tested for Covid 19.  Patient is having the following symptoms:  achy, fatigue, headache, dry cough, nasal drainage, AS plus and temp of 99.9 at home.  Thank you .

## 2019-01-09 LAB — NOVEL CORONAVIRUS, NAA: SARS-CoV-2, NAA: NOT DETECTED

## 2019-01-18 DIAGNOSIS — G35 Multiple sclerosis: Secondary | ICD-10-CM | POA: Diagnosis not present

## 2019-01-18 DIAGNOSIS — M79601 Pain in right arm: Secondary | ICD-10-CM | POA: Diagnosis not present

## 2019-01-18 DIAGNOSIS — Z79899 Other long term (current) drug therapy: Secondary | ICD-10-CM | POA: Diagnosis not present

## 2019-01-18 DIAGNOSIS — G43909 Migraine, unspecified, not intractable, without status migrainosus: Secondary | ICD-10-CM | POA: Diagnosis not present

## 2019-01-25 DIAGNOSIS — N3020 Other chronic cystitis without hematuria: Secondary | ICD-10-CM | POA: Diagnosis not present

## 2019-02-04 ENCOUNTER — Ambulatory Visit: Payer: Medicare Other

## 2019-02-07 DIAGNOSIS — R339 Retention of urine, unspecified: Secondary | ICD-10-CM | POA: Diagnosis not present

## 2019-02-22 DIAGNOSIS — N3000 Acute cystitis without hematuria: Secondary | ICD-10-CM | POA: Diagnosis not present

## 2019-03-02 DIAGNOSIS — G43009 Migraine without aura, not intractable, without status migrainosus: Secondary | ICD-10-CM | POA: Diagnosis not present

## 2019-03-02 DIAGNOSIS — M79601 Pain in right arm: Secondary | ICD-10-CM | POA: Diagnosis not present

## 2019-03-02 DIAGNOSIS — Z79899 Other long term (current) drug therapy: Secondary | ICD-10-CM | POA: Diagnosis not present

## 2019-03-02 DIAGNOSIS — G35 Multiple sclerosis: Secondary | ICD-10-CM | POA: Diagnosis not present

## 2019-03-05 ENCOUNTER — Emergency Department (HOSPITAL_COMMUNITY)
Admission: EM | Admit: 2019-03-05 | Discharge: 2019-03-05 | Disposition: A | Discharge status: Home / Self Care | Payer: Medicare Other | Attending: Emergency Medicine | Admitting: Emergency Medicine

## 2019-03-05 ENCOUNTER — Encounter (HOSPITAL_COMMUNITY): Payer: Self-pay | Admitting: Emergency Medicine

## 2019-03-05 ENCOUNTER — Other Ambulatory Visit: Payer: Self-pay

## 2019-03-05 DIAGNOSIS — Z79899 Other long term (current) drug therapy: Secondary | ICD-10-CM | POA: Diagnosis not present

## 2019-03-05 DIAGNOSIS — Z9114 Patient's other noncompliance with medication regimen: Secondary | ICD-10-CM | POA: Diagnosis not present

## 2019-03-05 DIAGNOSIS — I10 Essential (primary) hypertension: Secondary | ICD-10-CM | POA: Diagnosis not present

## 2019-03-05 DIAGNOSIS — Y9301 Activity, walking, marching and hiking: Secondary | ICD-10-CM | POA: Diagnosis not present

## 2019-03-05 DIAGNOSIS — Z888 Allergy status to other drugs, medicaments and biological substances status: Secondary | ICD-10-CM | POA: Insufficient documentation

## 2019-03-05 DIAGNOSIS — W19XXXA Unspecified fall, initial encounter: Secondary | ICD-10-CM

## 2019-03-05 DIAGNOSIS — W010XXA Fall on same level from slipping, tripping and stumbling without subsequent striking against object, initial encounter: Secondary | ICD-10-CM | POA: Insufficient documentation

## 2019-03-05 DIAGNOSIS — S00432A Contusion of left ear, initial encounter: Secondary | ICD-10-CM

## 2019-03-05 DIAGNOSIS — G35 Multiple sclerosis: Secondary | ICD-10-CM | POA: Diagnosis not present

## 2019-03-05 DIAGNOSIS — Y999 Unspecified external cause status: Secondary | ICD-10-CM | POA: Insufficient documentation

## 2019-03-05 DIAGNOSIS — E781 Pure hyperglyceridemia: Secondary | ICD-10-CM | POA: Insufficient documentation

## 2019-03-05 DIAGNOSIS — S0121XA Laceration without foreign body of nose, initial encounter: Secondary | ICD-10-CM | POA: Insufficient documentation

## 2019-03-05 DIAGNOSIS — Z885 Allergy status to narcotic agent status: Secondary | ICD-10-CM | POA: Insufficient documentation

## 2019-03-05 DIAGNOSIS — Y92009 Unspecified place in unspecified non-institutional (private) residence as the place of occurrence of the external cause: Secondary | ICD-10-CM | POA: Insufficient documentation

## 2019-03-05 MED ORDER — LIDOCAINE HCL 2 % IJ SOLN
5.0000 mL | Freq: Once | INTRAMUSCULAR | Status: AC
  Administered 2019-03-05: 100 mg
  Filled 2019-03-05: qty 20

## 2019-03-05 NOTE — ED Provider Notes (Signed)
[facility] DEPT Provider Note   CSN: [id number] Arrival date & time: 03/05/19  2030    History   Chief Complaint Chief Complaint  Patient presents with  . Fall    HPI [name] is a 63 y.o. female.     The history is provided by the patient. No language interpreter was used.  Fall   [name] is a 63 y.o. female who presents to the Emergency Department complaining of fall, facial laceration. She was at home and tripped over her home and fell forward, striking her nose on the transition strip off the floor. She also bumped her ear on the door. She has pain to her nose and ear. No headache, neck pain, loss of consciousness. She denies any recent illnesses. She does not take any blood thinners. Symptoms are mild and constant nature. Past Medical History:  Diagnosis Date  . Anemia   . Blood in stool, frank 12/01/2011  . Depressive reaction   . Diverticulitis   . Diverticulosis   . Frequent UTI LAST 7/13  . 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 Mood disorder ([place])   . MS (multiple sclerosis) ([place])   . Rhinitis   . Superficial phlebitis   . Varicose veins     Patient Active Problem List   Diagnosis Date Noted  . Rectal bleeding 12/05/2016  . Internal hemorrhoids 12/05/2016  . Hx of falling 03/17/2016  . Nausea alone 02/13/2014  . Other malaise and fatigue 02/13/2014  . Postmenopausal estrogen deficiency 02/24/2013  . Visit for preventive health examination 02/24/2013  . Family hx osteoporosis 02/21/2013  . Recurrent UTI 01/09/2012  . Suprapubic tenderness 12/01/2011  . GERD (gastroesophageal reflux disease) 10/15/2011  . Dysphagia, pharyngoesophageal 10/15/2011  . Voiding dysfunction 09/24/2011  . Hypertriglyceridemia 02/09/2011  . VITAMIN D DEFICIENCY 12/13/2008  . Constipation 12/13/2008  . MUSCLE SPASM 12/13/2008  . DIZZINESS 12/13/2008  . HYPOKALEMIA, HX OF 12/13/2008  . DISORDER, ADJUSTMENT W/DEPRESSED MOOD  04/30/2007  . SCLEROSIS, MULTIPLE 04/30/2007  . HYPERTENSION 04/30/2007  . VARICOSE VEIN, LWR EXTREMITIES W/INFLAMMATION 04/30/2007  . VASOMOTOR RHINITIS 04/30/2007    Past Surgical History:  Procedure Laterality Date  . ABDOMINAL HYSTERECTOMY  2008  . BLADDER SURGERY  2008   Tack  . SHOULDER ARTHROSCOPY WITH ROTATOR CUFF REPAIR AND SUBACROMIAL DECOMPRESSION  06/01/2012   Procedure: SHOULDER ARTHROSCOPY WITH ROTATOR CUFF REPAIR AND SUBACROMIAL DECOMPRESSION;  Surgeon: Magnus Sinning, MD;  Location: [facility];  Service: Orthopedics;  Laterality: Left;  LEFT SHOULDER ARTHROSCOPY WITH SUBACROMIAL DECOMPRESION AND DEBRIDEMENT OF PARTIAL ROTATOR CUFF TEAR  . TONSILLECTOMY AND ADENOIDECTOMY       OB History   No obstetric history on file.      Home Medications    Prior to Admission medications   Medication Sig Start Date End Date Taking? Authorizing Provider  amLODipine (NORVASC) 5 MG tablet Take 1 tablet (5 mg total) by mouth daily. 09/27/18   [name], [name], MD  cefUROXime (CEFTIN) 500 MG tablet Take 1 tablet (500 mg total) by mouth 2 (two) times daily. For sinusitis Patient not taking: Reported on 01/07/2019 10/01/18   [name], [name], MD  cephALEXin [facility]) 500 MG capsule  09/14/18   [provider]  Cholecalciferol (VITAMIN D-3) 5000 UNITS TABS Take 1 tablet by mouth daily.    [provider]  DULoxetine (CYMBALTA) 30 MG capsule Take 1 cap daily x 1wk, then 2 caps daily thereafter. 03/11/16   [provider]  esomeprazole (NEXIUM) 20 MG capsule Take 20-40 mg by mouth daily as needed (acid reflux).    [provider]  gabapentin (NEURONTIN) 300 MG capsule Take by mouth 1 day or 1 dose.    [provider]  HYDROcodone-homatropine (HYCODAN) 5-1.5 MG/5ML syrup Take 5 mLs by mouth every 8 (eight) hours as needed for cough (at night). 10/01/18   [name], [name], MD  meloxicam (MOBIC) 15 MG tablet TAKE ONE TABLET BY MOUTH DAILY  Patient not taking: Reported on 01/07/2019 03/24/17   Evelina Bucy, DPM  Meth-Hyo-M Bl-Na Phos-Ph Sal (URIBEL) 118 MG CAPS Take 1 capsule by mouth daily as needed (bladder spasms). Using PRN    [provider]  natalizumab (TYSABRI) 300 MG/15ML injection Inject into the vein every 30 (thirty) days.     [provider]  Probiotic Product (PROBIOTIC PEARLS) CAPS Take 1 capsule by mouth daily.    [provider]  tiZANidine (ZANAFLEX) 4 MG tablet Take 8 mg by mouth at bedtime. Take 3 by mouth 03/13/15   [provider]    Family History Family History  Problem Relation Age of Onset  . Lung cancer Father   . Colon cancer Neg Hx     Social History Social History   Tobacco Use  . Smoking status: Never Smoker  . Smokeless tobacco: Never Used  Substance Use Topics  . Alcohol use: Yes    Alcohol/week: 0.0 standard drinks    Comment: occ  . Drug use: No     Allergies   Meclizine hcl, Tramadol, Codeine, Hydrocodone, Omeprazole magnesium, Oxycodone, Prilosec [omeprazole magnesium], and Tramadol hcl   Review of Systems Review of Systems  All other systems reviewed and are negative.    Physical Exam Updated Vital Signs BP (!) 182/73   Pulse 84   Temp 98.4 F (36.9 C) (Oral)   Resp 18   Ht 5\' 6"  (1.676 m)   Wt 72.6 kg   SpO2 100%   BMI 25.82 kg/m   Physical Exam Vitals signs and nursing note reviewed.  Constitutional:      Appearance: She is well-developed.  HENT:     Head: Normocephalic.     Comments: V-shaped laceration to the nasal bridge. No septal hematoma. There is mild swelling to the left here. Neck:     Musculoskeletal: Neck supple.     Comments: No C-spine tenderness Cardiovascular:     Rate and Rhythm: Normal rate and regular rhythm.     Heart sounds: No murmur.  Pulmonary:     Effort: Pulmonary effort is normal. No respiratory distress.     Breath sounds: Normal breath sounds.  Abdominal:     Palpations: Abdomen is  soft.     Tenderness: There is no abdominal tenderness. There is no guarding or rebound.  Musculoskeletal:        General: No tenderness.  Skin:    General: Skin is warm and dry.  Neurological:     Mental Status: She is alert and oriented to person, place, and time.  Psychiatric:        Mood and Affect: Mood normal.        Behavior: Behavior normal.      ED Treatments / Results  Labs (all labs ordered are listed, but only abnormal results are displayed) Labs Reviewed - No data to display  EKG None  Radiology No results found.  Procedures .Marland KitchenLaceration Repair  Date/Time: 03/05/2019 10:20 PM Performed by: Quintella Reichert,  MD Authorized by: Quintella Reichert, MD   Consent:    Consent obtained:  Verbal   Consent given by:  Patient Anesthesia (see MAR for exact dosages):    Anesthesia method:  Local infiltration   Local anesthetic:  Lidocaine 2% w/o epi Laceration details:    Location: nasal bridge.   Length (cm):  1.5 Repair type:    Repair type:  Simple Pre-procedure details:    Preparation:  Patient was prepped and draped in usual sterile fashion Exploration:    Hemostasis achieved with:  Direct pressure   Contaminated: no   Treatment:    Area cleansed with:  Shur-Clens and saline   Amount of cleaning:  Standard   Irrigation solution:  Sterile saline Skin repair:    Repair method:  Sutures   Suture size:  5-0   Suture material:  Prolene   Suture technique:  Simple interrupted   Number of sutures:  3 Approximation:    Approximation:  Close Post-procedure details:    Dressing:  Antibiotic ointment   (including critical care time)  Medications Ordered in ED Medications  lidocaine (XYLOCAINE) 2 % (with pres) injection 100 mg (100 mg Infiltration Given 03/05/19 2123)     Initial Impression / Assessment and Plan / ED Course  I have reviewed the triage vital signs and the nursing notes.  Pertinent labs & imaging results that were available during my care of  the patient were reviewed by me and considered in my medical decision making (see chart for details).       Patient here for evaluation of injuries following a mechanical fall. She has a laceration to her nasal bridge that was repaired per procedure note. CT head deferred based on [place] head CT criteria. She does have a contusion to her left ear that will not require drainage. Discussed with patient home care for nasal laceration as well as ear contusion. Discussed outpatient follow-up and return precautions. Final Clinical Impressions(s) / ED Diagnoses   Final diagnoses:  Nasal laceration, initial encounter  Fall, initial encounter  Contusion of auricle of left ear, initial encounter    ED Discharge Orders    None       Quintella Reichert, MD 03/05/19 2351

## 2019-03-05 NOTE — ED Triage Notes (Signed)
Pt reports tripping around 6pm tonight and has a 1cm laceration to bridge of nose. Pt denies any LOC.

## 2019-03-05 NOTE — ED Notes (Signed)
Pts BP noted to be elevated, pt states she is aware of it and its been that way, that she is supposed to be taking meds for it but has been unable to get back into her doctor to get a refill and has been without them for a while "its a long story" she said and would not elaborate further. Denies CP, blurred vision, dizziness or or HA.

## 2019-03-08 NOTE — Progress Notes (Signed)
Chief Complaint  Patient presents with  . Hypertension    pt has been having high blood pressure   . Follow-up    ed suture remorval hx of fall    HPI: [name] 63 y.o. come in for  Fu ed 8/8 visit for fall  righ toe gets caught and tripped in her home  No foot drop but the right leg is the weak one  Not using walker  Cane in home  As much  Feel no face and nose  3 sutures   No concussion of vision neuro se  Here for suture  removal    Also bp was very high  She had taken  amlodipine 5 for a couple months and bp was very good but had lots of leg swelling so stopped it  .  bp at her infusions are high  And was  very high in ed . At home in 140+ range  ROS: See pertinent positives and negatives per HPI. No cp sob   Past Medical History:  Diagnosis Date  . Anemia   . Blood in stool, frank 12/01/2011  . Depressive reaction   . Diverticulitis   . Diverticulosis   . Frequent UTI LAST 7/13  . 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 Mood disorder ([place])   . MS (multiple sclerosis) ([place])   . Rhinitis   . Superficial phlebitis   . Varicose veins     Family History  Problem Relation Age of Onset  . Lung cancer Father   . Colon cancer Neg Hx     Social History   Socioeconomic History  . Marital status: Married    Spouse name: Not on file  . Number of children: 4  . Years of education: Not on file  . Highest education level: Not on file  Occupational History  . Occupation: Disability  Social Needs  . Financial resource strain: Not on file  . Food insecurity    Worry: Not on file    Inability: Not on file  . Transportation needs    Medical: Not on file    Non-medical: Not on file  Tobacco Use  . Smoking status: Never Smoker  . Smokeless tobacco: Never Used  Substance and Sexual Activity  . Alcohol use: Yes    Alcohol/week: 0.0 standard drinks    Comment: occ  . Drug use: No  . Sexual activity: Not on file  Lifestyle  . Physical activity    Days per week: Not  on file    Minutes per session: Not on file  . Stress: Not on file  Relationships  . Social Herbalist on phone: Not on file    Gets together: Not on file    Attends religious service: Not on file    Active member of club or organization: Not on file    Attends meetings of clubs or organizations: Not on file    Relationship status: Not on file  Other Topics Concern  . Not on file  Social History Narrative   Unable to volunteer    Now on disability   Children now out of home   Daily caffeine     Outpatient Medications Prior to Visit  Medication Sig Dispense Refill  . cephALEXin (KEFLEX) 500 MG capsule     . Cholecalciferol (VITAMIN D-3) 5000 UNITS TABS Take 1 tablet by mouth daily.    . cyclobenzaprine (FLEXERIL) 10 MG tablet Take by mouth.    Marland Kitchen  DULoxetine (CYMBALTA) 30 MG capsule Take 1 cap daily x 1wk, then 2 caps daily thereafter.    . esomeprazole (NEXIUM) 20 MG capsule Take 20-40 mg by mouth daily as needed (acid reflux).    . gabapentin (NEURONTIN) 300 MG capsule Take by mouth 1 day or 1 dose.    Marland Kitchen HYDROcodone-homatropine (HYCODAN) 5-1.5 MG/5ML syrup Take 5 mLs by mouth every 8 (eight) hours as needed for cough (at night). 90 mL 0  . Meth-Hyo-M Bl-Na Phos-Ph Sal (URIBEL) 118 MG CAPS Take 1 capsule by mouth daily as needed (bladder spasms). Using PRN    . natalizumab (TYSABRI) 300 MG/15ML injection Inject into the vein every 30 (thirty) days.     . Probiotic Product (PROBIOTIC PEARLS) CAPS Take 1 capsule by mouth daily.    Marland Kitchen tiZANidine (ZANAFLEX) 4 MG tablet Take 8 mg by mouth at bedtime. Take 3 by mouth    . cefUROXime (CEFTIN) 500 MG tablet Take 1 tablet (500 mg total) by mouth 2 (two) times daily. For sinusitis (Patient not taking: Reported on 01/07/2019) 14 tablet 0  . meloxicam (MOBIC) 15 MG tablet TAKE ONE TABLET BY MOUTH DAILY (Patient not taking: Reported on 01/07/2019) 30 tablet 0  . amLODipine (NORVASC) 5 MG tablet Take 1 tablet (5 mg total) by mouth daily.  (Patient not taking: Reported on 03/09/2019) 30 tablet 5   No facility-administered medications prior to visit.      EXAM:  BP 130/80 (BP Location: Right Arm)   Pulse 68   Temp 97.7 F (36.5 C) (Temporal)   Wt 168 lb (76.2 kg)   SpO2 96%   BMI 27.12 kg/m   Body mass index is 27.12 kg/m. Repeat bp is in range today  GENERAL: vitals reviewed and listed above, alert, oriented, appears well hydrated and in no acute distress HEENT: fading bruisng face   No edema  Beveled laceration on bridge of nose  clearn  Removed without difficulty  Good opposition , conjunctiva  clear, no obvious abnormalities on inspection of external nose and ears lleft era cartilage bruised but no hematoma  NECK: no obvious masses on inspection palpation  LUNGS: clear to auscultation bilaterally, no wheezes, rales or rhonchi, good air movement CV: HRRR, no clubbing cyanosis or  peripheral edema nl cap refill  Legs no edema today  PSYCH: pleasant and cooperative, no obvious depression or anxiety Lab Results  Component Value Date   WBC 8.6 06/01/2018   HGB 13.2 06/01/2018   HCT 38.3 06/01/2018   PLT 271.0 06/01/2018   GLUCOSE 101 (H) 06/01/2018   CHOL 194 06/01/2018   TRIG 113.0 06/01/2018   HDL 64.10 06/01/2018   LDLDIRECT 110.0 04/23/2015   LDLCALC 107 (H) 06/01/2018   ALT 27 06/01/2018   AST 20 06/01/2018   NA 140 06/01/2018   K 4.4 06/01/2018   CL 102 06/01/2018   CREATININE 0.74 06/01/2018   BUN 17 06/01/2018   CO2 30 06/01/2018   TSH 1.01 04/27/2017   HGBA1C 5.2 02/13/2014   BP Readings from Last 3 Encounters:  03/09/19 130/80  03/05/19 (!) 182/73  10/01/18 134/76    ASSESSMENT AND PLAN:  Discussed the following assessment and plan:   ICD-10-CM   1. Essential hypertension  I10   2. Medication management  Z79.899   3. SCLEROSIS, MULTIPLE  G35   4. Visit for suture removal  Z48.02    wound looks clean    5. History of bad fall  Z91.81    counseled  6. Elevated blood pressure  reading  R03.0   7. Medication side effect  T88.7XXA    edema with 5 of amlodipine will try lower dose   Sutures removal disc fall prevention  consdier walker in home other   Asks about thn calls  She is getting.   Wound care and check prn  Try lower dose amlo 2.5 mg and if getting swelling contact us   Otherwise  Fu readings my chart in about a month to decide  if stay on   interesting that bp came down after sitting relaxing a while   No ortho statis sx     -Patient advised to return or notify health care team  if  new concerns arise.  Patient Instructions    Fall attention.   Try low dose of amlodipine  And let me know in about a month BP readings and if you are getting  Unacceptable swelling  . Your repeat BP today was ok 130/80    Mariann Laster K. Angeligue Bowne M.D.

## 2019-03-09 ENCOUNTER — Encounter: Payer: Self-pay | Admitting: Internal Medicine

## 2019-03-09 ENCOUNTER — Ambulatory Visit (INDEPENDENT_AMBULATORY_CARE_PROVIDER_SITE_OTHER): Payer: Medicare Other | Admitting: Internal Medicine

## 2019-03-09 VITALS — BP 130/80 | HR 68 | Temp 97.7°F | Wt 168.0 lb

## 2019-03-09 DIAGNOSIS — G35 Multiple sclerosis: Secondary | ICD-10-CM | POA: Diagnosis not present

## 2019-03-09 DIAGNOSIS — Z4802 Encounter for removal of sutures: Secondary | ICD-10-CM

## 2019-03-09 DIAGNOSIS — R030 Elevated blood-pressure reading, without diagnosis of hypertension: Secondary | ICD-10-CM

## 2019-03-09 DIAGNOSIS — I10 Essential (primary) hypertension: Secondary | ICD-10-CM

## 2019-03-09 DIAGNOSIS — Z9181 History of falling: Secondary | ICD-10-CM

## 2019-03-09 DIAGNOSIS — Z79899 Other long term (current) drug therapy: Secondary | ICD-10-CM | POA: Diagnosis not present

## 2019-03-09 DIAGNOSIS — T887XXA Unspecified adverse effect of drug or medicament, initial encounter: Secondary | ICD-10-CM

## 2019-03-09 MED ORDER — AMLODIPINE BESYLATE 2.5 MG PO TABS: 2.5000 mg | ORAL_TABLET | Freq: Every day | ORAL | 3 refills | Status: DC

## 2019-03-09 NOTE — Patient Instructions (Signed)
   Fall attention.   Try low dose of amlodipine  And let me know in about a month BP readings and if you are getting  Unacceptable swelling  . Your repeat BP today was ok 130/80

## 2019-03-15 DIAGNOSIS — R339 Retention of urine, unspecified: Secondary | ICD-10-CM | POA: Diagnosis not present

## 2019-04-07 DIAGNOSIS — G43009 Migraine without aura, not intractable, without status migrainosus: Secondary | ICD-10-CM | POA: Diagnosis not present

## 2019-04-07 DIAGNOSIS — G35 Multiple sclerosis: Secondary | ICD-10-CM | POA: Diagnosis not present

## 2019-04-07 DIAGNOSIS — M79601 Pain in right arm: Secondary | ICD-10-CM | POA: Diagnosis not present

## 2019-04-07 DIAGNOSIS — Z79899 Other long term (current) drug therapy: Secondary | ICD-10-CM | POA: Diagnosis not present

## 2019-04-13 DIAGNOSIS — N3020 Other chronic cystitis without hematuria: Secondary | ICD-10-CM | POA: Diagnosis not present

## 2019-04-15 DIAGNOSIS — G35 Multiple sclerosis: Secondary | ICD-10-CM | POA: Diagnosis not present

## 2019-04-15 DIAGNOSIS — S199XXA Unspecified injury of neck, initial encounter: Secondary | ICD-10-CM | POA: Diagnosis not present

## 2019-04-15 DIAGNOSIS — S0990XA Unspecified injury of head, initial encounter: Secondary | ICD-10-CM | POA: Diagnosis not present

## 2019-04-26 DIAGNOSIS — R339 Retention of urine, unspecified: Secondary | ICD-10-CM | POA: Diagnosis not present

## 2019-04-29 DIAGNOSIS — D2361 Other benign neoplasm of skin of right upper limb, including shoulder: Secondary | ICD-10-CM | POA: Diagnosis not present

## 2019-04-29 DIAGNOSIS — L280 Lichen simplex chronicus: Secondary | ICD-10-CM | POA: Diagnosis not present

## 2019-04-29 DIAGNOSIS — L821 Other seborrheic keratosis: Secondary | ICD-10-CM | POA: Diagnosis not present

## 2019-04-29 DIAGNOSIS — L814 Other melanin hyperpigmentation: Secondary | ICD-10-CM | POA: Diagnosis not present

## 2019-04-29 DIAGNOSIS — L570 Actinic keratosis: Secondary | ICD-10-CM | POA: Diagnosis not present

## 2019-04-29 DIAGNOSIS — L720 Epidermal cyst: Secondary | ICD-10-CM | POA: Diagnosis not present

## 2019-05-12 ENCOUNTER — Other Ambulatory Visit: Payer: Self-pay

## 2019-05-12 ENCOUNTER — Ambulatory Visit: Payer: Medicare Other | Attending: Neurology | Admitting: Physical Therapy

## 2019-05-12 DIAGNOSIS — R2689 Other abnormalities of gait and mobility: Secondary | ICD-10-CM | POA: Diagnosis not present

## 2019-05-12 DIAGNOSIS — R2681 Unsteadiness on feet: Secondary | ICD-10-CM | POA: Insufficient documentation

## 2019-05-12 DIAGNOSIS — Z79899 Other long term (current) drug therapy: Secondary | ICD-10-CM | POA: Diagnosis not present

## 2019-05-12 DIAGNOSIS — G35 Multiple sclerosis: Secondary | ICD-10-CM | POA: Diagnosis not present

## 2019-05-12 DIAGNOSIS — M6281 Muscle weakness (generalized): Secondary | ICD-10-CM | POA: Diagnosis not present

## 2019-05-13 ENCOUNTER — Encounter: Payer: Self-pay | Admitting: Physical Therapy

## 2019-05-13 NOTE — Therapy (Signed)
[facility] [address] [place], [name], [address] Phone: [phone]   Fax:  [phone]  Physical Therapy Evaluation  Patient Details  Name: [name] MRN: [id number] Date of Birth: [birth year] Referring Provider (PT): Karl Luke   Encounter Date: 05/12/2019  PT End of Session - 05/13/19 1336    Visit Number  1    Number of Visits  13    Date for PT Re-Evaluation  07/01/19    Authorization Type  [place] need 10th visit progress note    PT Start Time  0849    PT Stop Time  0928    PT Time Calculation (min)  39 min    Equipment Utilized During Treatment  Gait belt    Activity Tolerance  Patient tolerated treatment well    Behavior During Therapy  [facility] for tasks assessed/performed       Past Medical History:  Diagnosis Date  . Anemia   . Blood in stool, frank 12/01/2011  . Depressive reaction   . Diverticulitis   . Diverticulosis   . Frequent UTI LAST 7/13  . 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 Mood disorder ([place])   . MS (multiple sclerosis) ([place])   . Rhinitis   . Superficial phlebitis   . Varicose veins     Past Surgical History:  Procedure Laterality Date  . ABDOMINAL HYSTERECTOMY  2008  . BLADDER SURGERY  2008   Tack  . SHOULDER ARTHROSCOPY WITH ROTATOR CUFF REPAIR AND SUBACROMIAL DECOMPRESSION  06/01/2012   Procedure: SHOULDER ARTHROSCOPY WITH ROTATOR CUFF REPAIR AND SUBACROMIAL DECOMPRESSION;  Surgeon: Magnus Sinning, MD;  Location: [facility];  Service: Orthopedics;  Laterality: Left;  LEFT SHOULDER ARTHROSCOPY WITH SUBACROMIAL DECOMPRESION AND DEBRIDEMENT OF PARTIAL ROTATOR CUFF TEAR  . TONSILLECTOMY AND ADENOIDECTOMY      There were no vitals filed for this visit.   Subjective Assessment - 05/12/19 0853    Subjective  Pt reports having some really bad falls, one in March and several in August.  One fall in August, I actually knocked myself  unconscious.  Been using rollator ever since.  R toe catches with walking, due to the stiffness.  Has difficulty with rolling in bed, getting legs into car.    Pertinent History  MS (dx 13 years ago)    Patient Stated Goals  Pt's goals for therapy are to improve balance and strength in RLE.    Currently in Pain?  No/denies         [facility] PT Assessment - 05/12/19 0857      Assessment   Medical Diagnosis  MS, imbalance    Referring Provider (PT)  Everette Rank, Lirim    Onset Date/Surgical Date  04/26/19   MD referral   Hand Dominance  Right      Precautions   Precautions  Fall      Balance Screen   Has the patient fallen in the past 6 months  Yes    How many times?  6    Has the patient had a decrease in activity level because of a fear of falling?   Yes    Is the patient reluctant to leave their home because of a fear of falling?   Yes      [place]  Private residence    Living Arrangements  Spouse/significant other    Available Help at Discharge  Family    Type of  Home  House    Home Access  Stairs to enter    Entrance Stairs-Number of Steps  3    Entrance Stairs-Rails  Right;Left;Can reach both    Home Layout  One level    [place] - 4 wheels;Grab bars - tub/shower;[place]asandra Knudsen - single point      Prior Function   Level of Independence  Independent with household mobility with device;Independent with community mobility without device   Husband stays close for safety   Leisure  Enjoys walking in the neighborhood, gardening; pt and husband keep 13 month old grandson 2x/wk      Observation/Other Assessments   Focus on Therapeutic Outcomes (FOTO)   NA      Posture/Postural Control   Posture/Postural Control  Postural limitations    Postural Limitations  Rounded Shoulders;Forward head      Tone   Assessment Location  Right Lower Extremity;Left Lower Extremity      ROM / Strength   AROM / PROM / Strength  Strength;AROM      Strength    Overall Strength  Deficits    Strength Assessment Site  Hip;Knee;Ankle    Right/Left Hip  Right;Left    Right Hip Flexion  3-/5    Left Hip Flexion  3+/5    Right/Left Knee  Right;Left    Right Knee Flexion  3+/5    Right Knee Extension  4/5    Left Knee Flexion  3+/5    Left Knee Extension  4/5    Right/Left Ankle  Right;Left    Right Ankle Dorsiflexion  3+/5    Left Ankle Dorsiflexion  3+/5      Transfers   Transfers  Sit to Stand;Stand to Sit    Sit to Stand  6: Modified independent (Device/Increase time);With upper extremity assist;From chair/3-in-1    Five time sit to stand comments   21.16   with UE support   Stand to Sit  6: Modified independent (Device/Increase time);With upper extremity assist;To chair/3-in-1      Ambulation/Gait   Ambulation/Gait  Yes    Ambulation/Gait Assistance  5: Supervision    Ambulation Distance (Feet)  80 Feet    Assistive device  Rollator    Gait Pattern  Step-through pattern;Decreased step length - right;Decreased hip/knee flexion - right;Decreased dorsiflexion - right;Poor foot clearance - right    Ambulation Surface  Level;Indoor    Gait velocity  15.06 sec = 2.18 ft/sec      Standardized Balance Assessment   Standardized Balance Assessment  Berg Balance Test;Timed Up and Go Test      Berg Balance Test   Sit to Stand  Able to stand  independently using hands    Standing Unsupported  Able to stand 2 minutes with supervision    Sitting with Back Unsupported but Feet Supported on Floor or Stool  Able to sit safely and securely 2 minutes    Stand to Sit  Controls descent by using hands    Transfers  Able to transfer safely, definite need of hands    Standing Unsupported with Eyes Closed  Able to stand 10 seconds with supervision    Standing Unsupported with Feet Together  Able to place feet together independently but unable to hold for 30 seconds    From Standing, Reach Forward with Outstretched Arm  Can reach forward >12 cm safely (5")     From Standing Position, Pick up Object from Floor  Unable to try/needs assist to keep  balance    From Standing Position, Turn to Look Behind Over each Shoulder  Looks behind from both sides and weight shifts well    Turn 360 Degrees  Needs close supervision or verbal cueing   6.94 sec   Standing Unsupported, Alternately Place Feet on Step/Stool  Able to complete >2 steps/needs minimal assist    Standing Unsupported, One Foot in Front  Able to plae foot ahead of the other independently and hold 30 seconds    Standing on One Leg  Tries to lift leg/unable to hold 3 seconds but remains standing independently    Total Score  34    Berg comment:  Scores <45/56 indicate increased fall risk.      Timed Up and Go Test   Normal TUG (seconds)  19.37    TUG Comments  Scores >13.5 seconds indiciates increased fall risk.      RLE Tone   RLE Tone  Mild      LLE Tone   LLE Tone  Within Functional Limits                Objective measurements completed on examination: See above findings.              PT Education - 05/13/19 1334    Education Details  Results of objective measures, fall risk per Merrilee Jansky and TUG and use of rollator as appropriate device currently    Person(s) Educated  Patient    Methods  Explanation    Comprehension  Verbalized understanding       PT Short Term Goals - 05/13/19 1345      PT SHORT TERM GOAL #1   Title  Pt will be independent with HEP to improve balance, strength, gait.    Time  4    Period  Weeks    Status  New    Target Date  06/10/19   May be delayed, if delayed start due to scheduling conflicts     PT SHORT TERM GOAL #2   Title  Pt will improve 5x sit<>stand transfer test to less than or equal to 18 seconds for improved lower extremity strength and transfer efficiency.    Time  4    Period  Weeks    Status  New    Target Date  06/10/19      PT SHORT TERM GOAL #3   Title  Pt will imporve Berg score to at least 38/56 for decreased fall  risk.    Time  4    Period  Weeks    Status  New    Target Date  06/10/19      PT SHORT TERM GOAL #4   Title  Pt will verbalize understanding of fall prevention in the home environment.    Time  4    Period  Weeks    Status  New    Target Date  06/10/19      PT SHORT TERM GOAL #5   Title  Pt will report at least 25% improvement in functional activities such as bed mobility and car transfers.    Time  4    Period  Weeks    Status  New    Target Date  06/10/19        PT Long Term Goals - 05/13/19 1347      PT LONG TERM GOAL #1   Title  Pt will be independent with progression of HEP for improved balance, strength, gait.  Time  6    Period  Weeks    Status  New    Target Date  06/24/19      PT LONG TERM GOAL #2   Title  Pt will improve TUG score to less than or equal to 15 seconds for decreased fall risk.    Time  6    Period  Weeks    Status  New    Target Date  06/24/19      PT LONG TERM GOAL #3   Title  Pt will improve gait velocity to at least 2.5 ft/sec for improved gait eficiency and safety.    Time  6    Period  Weeks    Status  New    Target Date  06/24/19      PT LONG TERM GOAL #4   Title  Pt will verbalize understanding of local MS resources, including exercise options upon d/c from PT.    Time  6    Period  Weeks    Status  New    Target Date  06/24/19             Plan - 05/13/19 1338    Clinical Impression Statement  Pt is a 62 year old female who presents to OPPT with history of MS (x 13 years), with recent history of falls.  Pt reports multiple falls in the past 6 months.  She presents with decreased RLE strength, abnormal tone, decreased balance, decreased gait independence and safety.  Pt enjoys gardening, walking, and helping to care for her youngest grandchild.  Pt will benefit from skilled PT to address the above stated deficits to decrease fall risk and improve functional mobility.    Personal Factors and Comorbidities  Comorbidity 2     Comorbidities  anemia, diverticulosis    Examination-Activity Limitations  Bed Mobility;Locomotion Level;Transfers   getting in and out of car   Examination-Participation Restrictions  --   gardening, caring for grandchild   Stability/Clinical Decision Making  Evolving/Moderate complexity    Clinical Decision Making  Moderate    Rehab Potential  Good    PT Frequency  2x / week    PT Duration  6 weeks   plus eval   PT Treatment/Interventions  ADLs/Self Care Home Management;Gait training;Neuromuscular re-education;Balance training;Therapeutic exercise;Therapeutic activities;Patient/family education;Functional mobility training;Electrical Stimulation;Orthotic Fit/Training    PT Next Visit Plan  Initiate HEP for RLE flexibility, strength, balance; try foot-up brace on RLE with gait; work on bed mobility and lifting legs into and out of car (use of leg lifter?)    Consulted and Agree with Plan of Care  Patient       Patient will benefit from skilled therapeutic intervention in order to improve the following deficits and impairments:  Abnormal gait, Decreased balance, Decreased mobility, Decreased strength, Difficulty walking, Impaired tone  Visit Diagnosis: Other abnormalities of gait and mobility  Unsteadiness on feet  Muscle weakness (generalized)     Problem List Patient Active Problem List   Diagnosis Date Noted  . Rectal bleeding 12/05/2016  . Internal hemorrhoids 12/05/2016  . Hx of falling 03/17/2016  . Nausea alone 02/13/2014  . Other malaise and fatigue 02/13/2014  . Postmenopausal estrogen deficiency 02/24/2013  . Visit for preventive health examination 02/24/2013  . Family hx osteoporosis 02/21/2013  . Recurrent UTI 01/09/2012  . Suprapubic tenderness 12/01/2011  . GERD (gastroesophageal reflux disease) 10/15/2011  . Dysphagia, pharyngoesophageal 10/15/2011  . Voiding dysfunction 09/24/2011  . Hypertriglyceridemia 02/09/2011  .  VITAMIN D DEFICIENCY 12/13/2008   . Constipation 12/13/2008  . MUSCLE SPASM 12/13/2008  . DIZZINESS 12/13/2008  . HYPOKALEMIA, HX OF 12/13/2008  . DISORDER, ADJUSTMENT W/DEPRESSED MOOD 04/30/2007  . SCLEROSIS, MULTIPLE 04/30/2007  . HYPERTENSION 04/30/2007  . VARICOSE VEIN, LWR EXTREMITIES W/INFLAMMATION 04/30/2007  . VASOMOTOR RHINITIS 04/30/2007    Briyah Wheelwright W. 05/13/2019, 1:53 PM Frazier Butt., PT  [facility] [address] [place], [name], [address] Phone: [phone]   Fax:  [phone]  Name: ROSAISELA LONA MRN: [id number] Date of Birth: 15-Apr-1956

## 2019-05-19 ENCOUNTER — Encounter: Payer: Self-pay | Admitting: Physical Therapy

## 2019-05-19 ENCOUNTER — Other Ambulatory Visit: Payer: Self-pay

## 2019-05-19 ENCOUNTER — Ambulatory Visit: Payer: Medicare Other | Admitting: Physical Therapy

## 2019-05-19 DIAGNOSIS — R2689 Other abnormalities of gait and mobility: Secondary | ICD-10-CM

## 2019-05-19 DIAGNOSIS — R2681 Unsteadiness on feet: Secondary | ICD-10-CM | POA: Diagnosis not present

## 2019-05-19 DIAGNOSIS — M6281 Muscle weakness (generalized): Secondary | ICD-10-CM

## 2019-05-19 NOTE — Patient Instructions (Signed)
Access Code: [email or web address]  URL: [email or web address]  Date: 05/19/2019  Prepared by: Mady Haagensen   Exercises Seated Hamstring Stretch - 3 reps - 1 sets - 30 hold - 2x daily - 6x weekly Seated March - 10 reps - 1 sets - 1x daily - 5x weekly Seated Ankle Pumps - 10 reps - 1-2 sets - 1x daily - 5x weekly

## 2019-05-19 NOTE — Therapy (Signed)
[facility] [address] [place], [name], [address] Phone: [phone]   Fax:  [phone]  Physical Therapy Treatment  Patient Details  Name: [name] MRN: [id number] Date of Birth: Jun 02, 1956 Referring Provider (PT): Karl Luke   Encounter Date: 05/19/2019  PT End of Session - 05/19/19 1041    Visit Number  2    Number of Visits  13    Date for PT Re-Evaluation  07/01/19    Authorization Type  [place] need 10th visit progress note    PT Start Time  [phone]    PT Stop Time  0932    PT Time Calculation (min)  40 min    Activity Tolerance  Patient tolerated treatment well    Behavior During Therapy  [facility] for tasks assessed/performed       Past Medical History:  Diagnosis Date  . Anemia   . Blood in stool, frank 12/01/2011  . Depressive reaction   . Diverticulitis   . Diverticulosis   . Frequent UTI LAST 7/13  . 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 Mood disorder ([place])   . MS (multiple sclerosis) ([place])   . Rhinitis   . Superficial phlebitis   . Varicose veins     Past Surgical History:  Procedure Laterality Date  . ABDOMINAL HYSTERECTOMY  2008  . BLADDER SURGERY  2008   Tack  . SHOULDER ARTHROSCOPY WITH ROTATOR CUFF REPAIR AND SUBACROMIAL DECOMPRESSION  06/01/2012   Procedure: SHOULDER ARTHROSCOPY WITH ROTATOR CUFF REPAIR AND SUBACROMIAL DECOMPRESSION;  Surgeon: Magnus Sinning, MD;  Location: [facility];  Service: Orthopedics;  Laterality: Left;  LEFT SHOULDER ARTHROSCOPY WITH SUBACROMIAL DECOMPRESION AND DEBRIDEMENT OF PARTIAL ROTATOR CUFF TEAR  . TONSILLECTOMY AND ADENOIDECTOMY      There were no vitals filed for this visit.  Subjective Assessment - 05/19/19 0854    Subjective  No falls since eval; feel a little stronger everyday.  Forgot to wear laced up shoes today (if something's not written down, I forget).    Pertinent History  MS (dx 13 years ago)     Patient Stated Goals  Pt's goals for therapy are to improve balance and strength in RLE.    Currently in Pain?  No/denies                       [facility] Adult PT Treatment/Exercise - 05/19/19 0855      Ambulation/Gait   Ambulation/Gait  Yes    Ambulation/Gait Assistance  5: Supervision    Ambulation Distance (Feet)  175 Feet   x 2   Assistive device  Rollator    Gait Pattern  Step-through pattern;Decreased step length - right;Decreased hip/knee flexion - right;Decreased dorsiflexion - right;Poor foot clearance - right    Ambulation Surface  Level;Indoor    Pre-Gait Activities  Discussed trial of foot-up brace, and placed on patient's shoe using Coban wrap (pt wearing slip on shoes today, but able to secure lower portion of foot-up with Coban).      Gait Comments  Trial of foot-up brace with improved R heelstrike and foot clearance noted throughout gait.  Pt open to trying again; PT reminded (via written note) pt to wear lace up shoes next visit.      Exercises   Exercises  Knee/Hip;Ankle;Lumbar      Lumbar Exercises: Seated   Other Seated Lumbar Exercises  Sitting at edge of mat, anterior/posterior pelvic tilts x  8 reps, cues for technique      Knee/Hip Exercises: Stretches   Active Hamstring Stretch  Right;Left;3 reps;30 seconds    Active Hamstring Stretch Limitations  foot propped on floor    Gastroc Stretch  Right;Left;2 reps;30 seconds   Runner's stretch at counter   Gastroc Stretch Limitations  Pt reports not feeling stretch    Other Knee/Hip Stretches  Standing gastroc stretch, foot propped at 4" step, 1 rep x 30 seconds; repositioned several times, pt does not feel stretch.      Knee/Hip Exercises: Seated   Long Arc Quad  Strengthening;Right;Left;1 set;10 reps    Heel Slides  Strengthening;AROM;Right;Left;10 reps;2 sets   Cues to keep feet flat, dig in with heels   Other Seated Knee/Hip Exercises  Seated step out and in, x 10 reps each leg, with cues for  increased height lifting leg.    Marching  Strengthening;Right;Left;1 set;10 reps             PT Education - 05/19/19 1039    Education Details  Initiated HEP to address flexiiblity, strength (see instructions); wrote post it note reminding pt to wear lace up shoes    Person(s) Educated  Patient    Methods  Explanation;Demonstration;Verbal cues;Handout    Comprehension  Verbalized understanding;Returned demonstration       PT Short Term Goals - 05/13/19 1345      PT SHORT TERM GOAL #1   Title  Pt will be independent with HEP to improve balance, strength, gait.    Time  4    Period  Weeks    Status  New    Target Date  06/10/19   May be delayed, if delayed start due to scheduling conflicts     PT SHORT TERM GOAL #2   Title  Pt will improve 5x sit<>stand transfer test to less than or equal to 18 seconds for improved lower extremity strength and transfer efficiency.    Time  4    Period  Weeks    Status  New    Target Date  06/10/19      PT SHORT TERM GOAL #3   Title  Pt will imporve Berg score to at least 38/56 for decreased fall risk.    Time  4    Period  Weeks    Status  New    Target Date  06/10/19      PT SHORT TERM GOAL #4   Title  Pt will verbalize understanding of fall prevention in the home environment.    Time  4    Period  Weeks    Status  New    Target Date  06/10/19      PT SHORT TERM GOAL #5   Title  Pt will report at least 25% improvement in functional activities such as bed mobility and car transfers.    Time  4    Period  Weeks    Status  New    Target Date  06/10/19        PT Long Term Goals - 05/13/19 1347      PT LONG TERM GOAL #1   Title  Pt will be independent with progression of HEP for improved balance, strength, gait.    Time  6    Period  Weeks    Status  New    Target Date  06/24/19      PT LONG TERM GOAL #2   Title  Pt will improve TUG  score to less than or equal to 15 seconds for decreased fall risk.    Time  6     Period  Weeks    Status  New    Target Date  06/24/19      PT LONG TERM GOAL #3   Title  Pt will improve gait velocity to at least 2.5 ft/sec for improved gait eficiency and safety.    Time  6    Period  Weeks    Status  New    Target Date  06/24/19      PT LONG TERM GOAL #4   Title  Pt will verbalize understanding of local MS resources, including exercise options upon d/c from PT.    Time  6    Period  Weeks    Status  New    Target Date  06/24/19            Plan - 05/19/19 1041    Clinical Impression Statement  Initiated HEP this visit to address lower extremity flexibility and strengthening.  Trial of foot-up brace on RLE (simulated through use of Coban to secure to shoe), with improved heelstrike, foot clearance and step length throughout gait.  Pt will benefit from continued skilled PT to address strength, flexibility, balance, and gait training for improved overall functional mobility and decreased fallrisk.    PT Frequency  2x / week    PT Duration  6 weeks   plus eval   PT Treatment/Interventions  ADLs/Self Care Home Management;Gait training;Neuromuscular re-education;Balance training;Therapeutic exercise;Therapeutic activities;Patient/family education;Functional mobility training;Electrical Stimulation;Orthotic Fit/Training    PT Next Visit Plan  Review HEP and add as needed, for RLE flexibility, strength, balance; try foot-up brace on RLE with gait (pt should be bringing in lace up shoes next visit) work on bed mobility and lifting legs into and out of car (use of leg lifter?)       Patient will benefit from skilled therapeutic intervention in order to improve the following deficits and impairments:  Abnormal gait, Decreased balance, Decreased mobility, Decreased strength, Difficulty walking, Impaired tone  Visit Diagnosis: Muscle weakness (generalized)  Other abnormalities of gait and mobility     Problem List Patient Active Problem List   Diagnosis Date  Noted  . Rectal bleeding 12/05/2016  . Internal hemorrhoids 12/05/2016  . Hx of falling 03/17/2016  . Nausea alone 02/13/2014  . Other malaise and fatigue 02/13/2014  . Postmenopausal estrogen deficiency 02/24/2013  . Visit for preventive health examination 02/24/2013  . Family hx osteoporosis 02/21/2013  . Recurrent UTI 01/09/2012  . Suprapubic tenderness 12/01/2011  . GERD (gastroesophageal reflux disease) 10/15/2011  . Dysphagia, pharyngoesophageal 10/15/2011  . Voiding dysfunction 09/24/2011  . Hypertriglyceridemia 02/09/2011  . VITAMIN D DEFICIENCY 12/13/2008  . Constipation 12/13/2008  . MUSCLE SPASM 12/13/2008  . DIZZINESS 12/13/2008  . HYPOKALEMIA, HX OF 12/13/2008  . DISORDER, ADJUSTMENT W/DEPRESSED MOOD 04/30/2007  . SCLEROSIS, MULTIPLE 04/30/2007  . HYPERTENSION 04/30/2007  . VARICOSE VEIN, LWR EXTREMITIES W/INFLAMMATION 04/30/2007  . VASOMOTOR RHINITIS 04/30/2007    Jakayla Schweppe W. 05/19/2019, 10:44 AM  Frazier Butt., PT   [facility] [address] [place], [name], [address] Phone: [phone]   Fax:  [phone]  Name: KYMBREE BAS MRN: [id number] Date of Birth: 02/17/1956

## 2019-05-23 ENCOUNTER — Other Ambulatory Visit: Payer: Self-pay

## 2019-05-23 ENCOUNTER — Ambulatory Visit: Payer: Self-pay | Admitting: *Deleted

## 2019-05-23 ENCOUNTER — Encounter: Payer: Self-pay | Admitting: Internal Medicine

## 2019-05-23 ENCOUNTER — Ambulatory Visit: Payer: Medicare Other | Admitting: Rehabilitation

## 2019-05-23 ENCOUNTER — Ambulatory Visit (INDEPENDENT_AMBULATORY_CARE_PROVIDER_SITE_OTHER): Payer: Medicare Other | Admitting: Internal Medicine

## 2019-05-23 VITALS — BP 146/90 | HR 80 | Temp 97.5°F | Ht 66.0 in | Wt 170.0 lb

## 2019-05-23 DIAGNOSIS — Z79899 Other long term (current) drug therapy: Secondary | ICD-10-CM

## 2019-05-23 DIAGNOSIS — B029 Zoster without complications: Secondary | ICD-10-CM

## 2019-05-23 DIAGNOSIS — G35 Multiple sclerosis: Secondary | ICD-10-CM

## 2019-05-23 MED ORDER — HYDROCODONE-ACETAMINOPHEN 5-325 MG PO TABS: 1.0000 | ORAL_TABLET | Freq: Four times a day (QID) | ORAL | 0 refills | Status: AC | PRN

## 2019-05-23 MED ORDER — VALACYCLOVIR HCL 1 G PO TABS: 1000.0000 mg | ORAL_TABLET | Freq: Three times a day (TID) | ORAL | 0 refills | Status: DC

## 2019-05-23 NOTE — Telephone Encounter (Signed)
  I returned pt's call.   She is c/o a rash that started Friday morning in the middle of her torso as a pimple.    This morning it has traveled around her right side to her back where it is scaly and in a cluster with bumps.  The places are as big as a half dollar.  Denies itching or burning.     She had the Shingles vaccine 2 weeks ago.   She has never had Shingles and was wondering if this was a wide effect of the Shingles shot. She had a PT session this morning but they told her not to come in due to the rash.  So that was cancelled.    I went over the care advice with her prior to warm transferring her call to [facility] in Dr. Velora Mediate office to be scheduled.  She verbalized understanding of the care advice and using the hydrocortisone cream if she develops itching.  I forwarded my notes to the office for Dr. Regis Bill.       Reason for Disposition . Localized rash present > 7 days    Rash started Friday morning.  Answer Assessment - Initial Assessment Questions 1. APPEARANCE of RASH: "Describe the rash."      I have a rash that started with a pimple in the middle of my torso.    Now this morning on my back it's more scaley, a cluster with bumps.   No itching.   2 weeks ago I had the Shingles vaccine.   I have M.S.        2. LOCATION: "Where is the rash located?"      My torso and my back. 3. NUMBER: "How many spots are there?"      As it progresses around my side it's as big as a half dollar.   It's on the right side.  Never had the Shingles before. 4. SIZE: "How big are the spots?" (Inches, centimeters or compare to size of a coin)      See above 5. ONSET: "When did the rash start?"      Friday morning I notice the pimple on my torso. 6. ITCHING: "Does the rash itch?" If so, ask: "How bad is the itch?"  (Scale 1-10; or mild, moderate, severe)     No itching 7. PAIN: "Does the rash hurt?" If so, ask: "How bad is the pain?"  (Scale 1-10; or mild, moderate, severe)     No pain 8. OTHER  SYMPTOMS: "Do you have any other symptoms?" (e.g., fever)     No fever or other symptoms. 9. PREGNANCY: "Is there any chance you are pregnant?" "When was your last menstrual period?"     N/A due to age  Protocols used: RASH OR REDNESS - LOCALIZED-A-AH

## 2019-05-23 NOTE — Telephone Encounter (Signed)
Opened chart twice by mistake 

## 2019-05-23 NOTE — Progress Notes (Signed)
Chief Complaint  Patient presents with  . Rash    HPI: [name] 63 y.o. come in for SDA  Rash  Sent in by nurse triage  Pt c/o rash under right breast radiating to back. Is having pain on back above rash. Denies itching. Pt had her 1st Shingrix vaccine 2 weeks ago.  Onset of pain and then spot about 3 days ago  And now spread . No fever   ROS: See pertinent positives and negatives per HPI. She is now on dec dose of gaba  Cause of sedation to poss dec fall risk  Past Medical History:  Diagnosis Date  . Anemia   . Blood in stool, frank 12/01/2011  . Depressive reaction   . Diverticulitis   . Diverticulosis   . Frequent UTI LAST 7/13  . 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 Mood disorder ([place])   . MS (multiple sclerosis) ([place])   . Rhinitis   . Superficial phlebitis   . Varicose veins     Family History  Problem Relation Age of Onset  . Lung cancer Father   . Colon cancer Neg Hx     Social History   Socioeconomic History  . Marital status: Married    Spouse name: Not on file  . Number of children: 4  . Years of education: Not on file  . Highest education level: Not on file  Occupational History  . Occupation: Disability  Social Needs  . Financial resource strain: Not on file  . Food insecurity    Worry: Not on file    Inability: Not on file  . Transportation needs    Medical: Not on file    Non-medical: Not on file  Tobacco Use  . Smoking status: Never Smoker  . Smokeless tobacco: Never Used  Substance and Sexual Activity  . Alcohol use: Yes    Alcohol/week: 0.0 standard drinks    Comment: occ  . Drug use: No  . Sexual activity: Not on file  Lifestyle  . Physical activity    Days per week: Not on file    Minutes per session: Not on file  . Stress: Not on file  Relationships  . Social Herbalist on phone: Not on file    Gets together: Not on file    Attends religious service: Not on file    Active member of club or organization: Not  on file    Attends meetings of clubs or organizations: Not on file    Relationship status: Not on file  Other Topics Concern  . Not on file  Social History Narrative   Unable to volunteer    Now on disability   Children now out of home   Daily caffeine     Outpatient Medications Prior to Visit  Medication Sig Dispense Refill  . amLODipine (NORVASC) 2.5 MG tablet Take 1 tablet (2.5 mg total) by mouth daily. 30 tablet 3  . cephALEXin (KEFLEX) 500 MG capsule Take 500 mg by mouth daily.     . Cholecalciferol (VITAMIN D-3) 5000 UNITS TABS Take 1 tablet by mouth daily.    . DULoxetine (CYMBALTA) 30 MG capsule Take 30 mg by mouth 2 (two) times daily.     Marland Kitchen esomeprazole (NEXIUM) 20 MG capsule Take 20-40 mg by mouth daily as needed (acid reflux).    . gabapentin (NEURONTIN) 300 MG capsule Take by mouth 1 day or 1 dose.    . gabapentin (NEURONTIN)  300 MG capsule TAKE ONE TO TWO CAPSULES BY MOUTH THREE TIMES A DAY AS DIRECTED    . Meth-Hyo-M Bl-Na Phos-Ph Sal (URIBEL) 118 MG CAPS Take 1 capsule by mouth daily as needed (bladder spasms). Using PRN    . natalizumab (TYSABRI) 300 MG/15ML injection Inject into the vein every 30 (thirty) days.     . Probiotic Product (PROBIOTIC PEARLS) CAPS Take 1 capsule by mouth daily.    Marland Kitchen tiZANidine (ZANAFLEX) 4 MG tablet Take 8 mg by mouth at bedtime. Take 3 by mouth    . DULoxetine (CYMBALTA) 60 MG capsule     . cefUROXime (CEFTIN) 500 MG tablet Take 1 tablet (500 mg total) by mouth 2 (two) times daily. For sinusitis (Patient not taking: Reported on 05/12/2019) 14 tablet 0  . HYDROcodone-homatropine (HYCODAN) 5-1.5 MG/5ML syrup Take 5 mLs by mouth every 8 (eight) hours as needed for cough (at night). (Patient not taking: Reported on 05/12/2019) 90 mL 0  . meloxicam (MOBIC) 15 MG tablet TAKE ONE TABLET BY MOUTH DAILY (Patient not taking: Reported on 05/12/2019) 30 tablet 0   No facility-administered medications prior to visit.      EXAM:  BP (!) 146/90 (BP  Location: Left Arm, Patient Position: Sitting, Cuff Size: Normal)   Pulse 80   Temp (!) 97.5 F (36.4 C) (Temporal)   Ht 5\' 6"  (1.676 m)   Wt 170 lb (77.1 kg)   SpO2 99%   BMI 27.44 kg/m   Body mass index is 27.44 kg/m.  GENERAL: vitals reviewed and listed above, alert, oriented, appears well hydrated and in no acute distress HEENT: atraumatic, conjunctiva  clear, no obvious abnormalities on inspection of external nose and ears OP : masked  NECK: no obvious masses on inspection palpation  LUNGS: clear to auscultation bilaterally, no wheezes, rales or rhonchi, good air movement CV: HRRR, no clubbing cyanosis or  peripheral edema nl cap refill  Skin  Right  Blotchy red bumps without vesicles / early  In dermatomal pattern.   PSYCH: pleasant and cooperative, no obvious depression or anxiety using cane  Lab Results  Component Value Date   WBC 8.6 06/01/2018   HGB 13.2 06/01/2018   HCT 38.3 06/01/2018   PLT 271.0 06/01/2018   GLUCOSE 101 (H) 06/01/2018   CHOL 194 06/01/2018   TRIG 113.0 06/01/2018   HDL 64.10 06/01/2018   LDLDIRECT 110.0 04/23/2015   LDLCALC 107 (H) 06/01/2018   ALT 27 06/01/2018   AST 20 06/01/2018   NA 140 06/01/2018   K 4.4 06/01/2018   CL 102 06/01/2018   CREATININE 0.74 06/01/2018   BUN 17 06/01/2018   CO2 30 06/01/2018   TSH 1.01 04/27/2017   HGBA1C 5.2 02/13/2014   BP Readings from Last 3 Encounters:  05/23/19 (!) 146/90  03/09/19 130/80  03/05/19 (!) 182/73    ASSESSMENT AND PLAN:  Discussed the following assessment and plan:  Herpes zoster without complication - almost 2 weeks out from shingrix NOt a live vaccine so should not be related   SCLEROSIS, MULTIPLE  High risk medication use She says can take could me d with hydrocodone   Just itching in past so thinks can take pain med   If needed will send in   In addition to the valtrex.   Fu if  persistent or progressive   Expectant management.  -Patient advised to return or notify health  care team  if  new concerns arise.  Patient Instructions  This is  shingles    Begin antiviral  As discussed   Can try  Pain med if needed . With cuation.    Let us know if  persistent or progressive     Shingles  Shingles, which is also known as herpes zoster, is an infection that causes a painful skin rash and fluid-filled blisters. It is caused by a virus. Shingles only develops in people who:  Have had chickenpox.  Have been given a medicine to protect against chickenpox (have been vaccinated). Shingles is rare in this group. What are the causes? Shingles is caused by varicella-zoster virus (VZV). This is the same virus that causes chickenpox. After a person is exposed to VZV, the virus stays in the body in an inactive (dormant) state. Shingles develops if the virus is reactivated. This can happen many years after the first (initial) exposure to VZV. It is not known what causes this virus to be reactivated. What increases the risk? People who have had chickenpox or received the chickenpox vaccine are at risk for shingles. Shingles infection is more common in people who:  Are older than age 56.  Have a weakened disease-fighting system (immune system), such as people with: ? HIV. ? AIDS. ? Cancer.  Are taking medicines that weaken the immune system, such as transplant medicines.  Are experiencing a lot of stress. What are the signs or symptoms? Early symptoms of this condition include itching, tingling, and pain in an area on your skin. Pain may be described as burning, stabbing, or throbbing. A few days or weeks after early symptoms start, a painful red rash appears. The rash is usually on one side of the body and has a band-like or belt-like pattern. The rash eventually turns into fluid-filled blisters that break open, change into scabs, and dry up in about 2-3 weeks. At any time during the infection, you may also develop:  A fever.  Chills.  A headache.  An upset  stomach. How is this diagnosed? This condition is diagnosed with a skin exam. Skin or fluid samples may be taken from the blisters before a diagnosis is made. These samples are examined under a microscope or sent to a lab for testing. How is this treated? The rash may last for several weeks. There is not a specific cure for this condition. Your health care provider will probably prescribe medicines to help you manage pain, recover more quickly, and avoid long-term problems. Medicines may include:  Antiviral drugs.  Anti-inflammatory drugs.  Pain medicines.  Anti-itching medicines (antihistamines). If the area involved is on your face, you may be referred to a specialist, such as an eye doctor (ophthalmologist) or an ear, nose, and throat (ENT) doctor (otolaryngologist) to help you avoid eye problems, chronic pain, or disability. Follow these instructions at home: Medicines  Take over-the-counter and prescription medicines only as told by your health care provider.  Apply an anti-itch cream or numbing cream to the affected area as told by your health care provider. Relieving itching and discomfort   Apply cold, wet cloths (cold compresses) to the area of the rash or blisters as told by your health care provider.  Cool baths can be soothing. Try adding baking soda or dry oatmeal to the water to reduce itching. Do not bathe in hot water. Blister and rash care  Keep your rash covered with a loose bandage (dressing). Wear loose-fitting clothing to help ease the pain of material rubbing against the rash.  Keep your rash and blisters clean by  washing the area with mild soap and cool water as told by your health care provider.  Check your rash every day for signs of infection. Check for: ? More redness, swelling, or pain. ? Fluid or blood. ? Warmth. ? Pus or a bad smell.  Do not scratch your rash or pick at your blisters. To help avoid scratching: ? Keep your fingernails clean and cut  short. ? Wear gloves or mittens while you sleep, if scratching is a problem. General instructions  Rest as told by your health care provider.  Keep all follow-up visits as told by your health care provider. This is important.  Wash your hands often with soap and water. If soap and water are not available, use hand sanitizer. Doing this lowers your chance of getting a bacterial skin infection.  Before your blisters change into scabs, your shingles infection can cause chickenpox in people who have never had it or have never been vaccinated against it. To prevent this from happening, avoid contact with other people, especially: ? Babies. ? Pregnant women. ? Children who have eczema. ? Elderly people who have transplants. ? People who have chronic illnesses, such as cancer or AIDS. Contact a health care provider if:  Your pain is not relieved with prescribed medicines.  Your pain does not get better after the rash heals.  You have signs of infection in the rash area, such as: ? More redness, swelling, or pain around the rash. ? Fluid or blood coming from the rash. ? The rash area feeling warm to the touch. ? Pus or a bad smell coming from the rash. Get help right away if:  The rash is on your face or nose.  You have facial pain, pain around your eye area, or loss of feeling on one side of your face.  You have difficulty seeing.  You have ear pain or have ringing in your ear.  You have a loss of taste.  Your condition gets worse. Summary  Shingles, which is also known as herpes zoster, is an infection that causes a painful skin rash and fluid-filled blisters.  This condition is diagnosed with a skin exam. Skin or fluid samples may be taken from the blisters and examined before the diagnosis is made.  Keep your rash covered with a loose bandage (dressing). Wear loose-fitting clothing to help ease the pain of material rubbing against the rash.  Before your blisters change  into scabs, your shingles infection can cause chickenpox in people who have never had it or have never been vaccinated against it. This information is not intended to replace advice given to you by your health care provider. Make sure you discuss any questions you have with your health care provider. Document Released: 07/14/2005 Document Revised: 11/05/2018 Document Reviewed: 03/18/2017 Elsevier Patient Education  2020 [place] Meriah Shands M.D.

## 2019-05-23 NOTE — Patient Instructions (Signed)
This is shingles    Begin antiviral  As discussed   Can try  Pain med if needed . With cuation.    Let us know if  persistent or progressive     Shingles  Shingles, which is also known as herpes zoster, is an infection that causes a painful skin rash and fluid-filled blisters. It is caused by a virus. Shingles only develops in people who:  Have had chickenpox.  Have been given a medicine to protect against chickenpox (have been vaccinated). Shingles is rare in this group. What are the causes? Shingles is caused by varicella-zoster virus (VZV). This is the same virus that causes chickenpox. After a person is exposed to VZV, the virus stays in the body in an inactive (dormant) state. Shingles develops if the virus is reactivated. This can happen many years after the first (initial) exposure to VZV. It is not known what causes this virus to be reactivated. What increases the risk? People who have had chickenpox or received the chickenpox vaccine are at risk for shingles. Shingles infection is more common in people who:  Are older than age 28.  Have a weakened disease-fighting system (immune system), such as people with: ? HIV. ? AIDS. ? Cancer.  Are taking medicines that weaken the immune system, such as transplant medicines.  Are experiencing a lot of stress. What are the signs or symptoms? Early symptoms of this condition include itching, tingling, and pain in an area on your skin. Pain may be described as burning, stabbing, or throbbing. A few days or weeks after early symptoms start, a painful red rash appears. The rash is usually on one side of the body and has a band-like or belt-like pattern. The rash eventually turns into fluid-filled blisters that break open, change into scabs, and dry up in about 2-3 weeks. At any time during the infection, you may also develop:  A fever.  Chills.  A headache.  An upset stomach. How is this diagnosed? This condition is diagnosed  with a skin exam. Skin or fluid samples may be taken from the blisters before a diagnosis is made. These samples are examined under a microscope or sent to a lab for testing. How is this treated? The rash may last for several weeks. There is not a specific cure for this condition. Your health care provider will probably prescribe medicines to help you manage pain, recover more quickly, and avoid long-term problems. Medicines may include:  Antiviral drugs.  Anti-inflammatory drugs.  Pain medicines.  Anti-itching medicines (antihistamines). If the area involved is on your face, you may be referred to a specialist, such as an eye doctor (ophthalmologist) or an ear, nose, and throat (ENT) doctor (otolaryngologist) to help you avoid eye problems, chronic pain, or disability. Follow these instructions at home: Medicines  Take over-the-counter and prescription medicines only as told by your health care provider.  Apply an anti-itch cream or numbing cream to the affected area as told by your health care provider. Relieving itching and discomfort   Apply cold, wet cloths (cold compresses) to the area of the rash or blisters as told by your health care provider.  Cool baths can be soothing. Try adding baking soda or dry oatmeal to the water to reduce itching. Do not bathe in hot water. Blister and rash care  Keep your rash covered with a loose bandage (dressing). Wear loose-fitting clothing to help ease the pain of material rubbing against the rash.  Keep your rash and blisters  clean by washing the area with mild soap and cool water as told by your health care provider.  Check your rash every day for signs of infection. Check for: ? More redness, swelling, or pain. ? Fluid or blood. ? Warmth. ? Pus or a bad smell.  Do not scratch your rash or pick at your blisters. To help avoid scratching: ? Keep your fingernails clean and cut short. ? Wear gloves or mittens while you sleep, if  scratching is a problem. General instructions  Rest as told by your health care provider.  Keep all follow-up visits as told by your health care provider. This is important.  Wash your hands often with soap and water. If soap and water are not available, use hand sanitizer. Doing this lowers your chance of getting a bacterial skin infection.  Before your blisters change into scabs, your shingles infection can cause chickenpox in people who have never had it or have never been vaccinated against it. To prevent this from happening, avoid contact with other people, especially: ? Babies. ? Pregnant women. ? Children who have eczema. ? Elderly people who have transplants. ? People who have chronic illnesses, such as cancer or AIDS. Contact a health care provider if:  Your pain is not relieved with prescribed medicines.  Your pain does not get better after the rash heals.  You have signs of infection in the rash area, such as: ? More redness, swelling, or pain around the rash. ? Fluid or blood coming from the rash. ? The rash area feeling warm to the touch. ? Pus or a bad smell coming from the rash. Get help right away if:  The rash is on your face or nose.  You have facial pain, pain around your eye area, or loss of feeling on one side of your face.  You have difficulty seeing.  You have ear pain or have ringing in your ear.  You have a loss of taste.  Your condition gets worse. Summary  Shingles, which is also known as herpes zoster, is an infection that causes a painful skin rash and fluid-filled blisters.  This condition is diagnosed with a skin exam. Skin or fluid samples may be taken from the blisters and examined before the diagnosis is made.  Keep your rash covered with a loose bandage (dressing). Wear loose-fitting clothing to help ease the pain of material rubbing against the rash.  Before your blisters change into scabs, your shingles infection can cause chickenpox  in people who have never had it or have never been vaccinated against it. This information is not intended to replace advice given to you by your health care provider. Make sure you discuss any questions you have with your health care provider. Document Released: 07/14/2005 Document Revised: 11/05/2018 Document Reviewed: 03/18/2017 Elsevier Patient Education  2020 [employer].

## 2019-05-23 NOTE — Telephone Encounter (Signed)
Pt scheduled  

## 2019-05-26 ENCOUNTER — Ambulatory Visit: Payer: Medicare Other | Admitting: Physical Therapy

## 2019-05-31 ENCOUNTER — Other Ambulatory Visit: Payer: Self-pay

## 2019-05-31 ENCOUNTER — Ambulatory Visit: Payer: Medicare Other | Attending: Neurology | Admitting: Physical Therapy

## 2019-05-31 ENCOUNTER — Encounter: Payer: Self-pay | Admitting: Physical Therapy

## 2019-05-31 DIAGNOSIS — R2689 Other abnormalities of gait and mobility: Secondary | ICD-10-CM | POA: Insufficient documentation

## 2019-05-31 DIAGNOSIS — R2681 Unsteadiness on feet: Secondary | ICD-10-CM | POA: Diagnosis not present

## 2019-05-31 DIAGNOSIS — M6281 Muscle weakness (generalized): Secondary | ICD-10-CM | POA: Diagnosis not present

## 2019-05-31 NOTE — Patient Instructions (Addendum)
Foot up brace  [email or web address]  You will need a size Large  Access Code: [email or web address]  URL: [email or web address]  Date: 05/31/2019  Prepared by: Mady Haagensen   Exercises Seated Hamstring Stretch - 3 reps - 1 sets - 30 hold - 2x daily - 6x weekly Seated March - 10 reps - 1 sets - 1x daily - 5x weekly Seated Ankle Pumps - 10 reps - 1-2 sets - 1x daily - 5x weekly Seated Pelvic Tilt - 10 reps - 1-2 sets - 1x daily - 5x weekly

## 2019-06-01 NOTE — Therapy (Signed)
[facility] [address] [place], [name], [address] Phone: [phone]   Fax:  [phone]  Physical Therapy Treatment  Patient Details  Name: [name] MRN: [id number] Date of Birth: May 16, 1956 Referring Provider (PT): Karl Luke   Encounter Date: 05/31/2019  PT End of Session - 06/01/19 0910    Visit Number  3    Number of Visits  13    Date for PT Re-Evaluation  07/01/19    Authorization Type  [facility] need 10th visit progress note    PT Start Time  1316    PT Stop Time  1400    PT Time Calculation (min)  44 min    Activity Tolerance  Patient tolerated treatment well    Behavior During Therapy  [facility] for tasks assessed/performed       Past Medical History:  Diagnosis Date  . Anemia   . Blood in stool, frank 12/01/2011  . Depressive reaction   . Diverticulitis   . Diverticulosis   . Frequent UTI LAST 7/13  . 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 Mood disorder ([place])   . MS (multiple sclerosis) ([place])   . Rhinitis   . Superficial phlebitis   . Varicose veins     Past Surgical History:  Procedure Laterality Date  . ABDOMINAL HYSTERECTOMY  2008  . BLADDER SURGERY  2008   Tack  . SHOULDER ARTHROSCOPY WITH ROTATOR CUFF REPAIR AND SUBACROMIAL DECOMPRESSION  06/01/2012   Procedure: SHOULDER ARTHROSCOPY WITH ROTATOR CUFF REPAIR AND SUBACROMIAL DECOMPRESSION;  Surgeon: Magnus Sinning, MD;  Location: [facility];  Service: Orthopedics;  Laterality: Left;  LEFT SHOULDER ARTHROSCOPY WITH SUBACROMIAL DECOMPRESION AND DEBRIDEMENT OF PARTIAL ROTATOR CUFF TEAR  . TONSILLECTOMY AND ADENOIDECTOMY      There were no vitals filed for this visit.  Subjective Assessment - 05/31/19 1317    Subjective  The shingles pain is worse later in the day.  The sores are scabbed over and dried.    Pertinent History  MS (dx 13 years ago)    Patient Stated Goals  Pt's goals for therapy are  to improve balance and strength in RLE.    Currently in Pain?  Yes    Pain Score  2     Pain Location  Back   trunk   Pain Orientation  Right;Mid    Pain Descriptors / Indicators  Burning    Pain Type  Acute pain    Pain Onset  1 to 4 weeks ago    Pain Frequency  Intermittent    Aggravating Factors   unsure    Pain Relieving Factors  unsure                       [facility] Adult PT Treatment/Exercise - 06/01/19 0903      Ambulation/Gait   Ambulation/Gait  Yes    Ambulation/Gait Assistance  5: Supervision    Ambulation/Gait Assistance Details  Pt wears lace up shoes this visit, allowing PT to place foot-up brace for trial during gait.    Ambulation Distance (Feet)  230 Feet   115 ft , wearing foot up brace, RLE   Assistive device  Rollator    Gait Pattern  Step-through pattern;Decreased step length - right;Decreased hip/knee flexion - right;Decreased dorsiflexion - right;Poor foot clearance - right   Improved foot clearance, heelstrike with foot up brace   Ambulation Surface  Level;Indoor    Pre-Gait  Activities  Discussed benefits of use of foot-up brace (she still has decreased knee flexion with swing phase, but definitely notes improved foot clearance and heelstrike).  Provided information on how to obtain foot-up brace, including size (pt will need size large)    Gait Comments  Additional 40 ft x 2 with rollator, no foot-up brace; pt needs cues to slow pace for improved RLE foot clearance.      Lumbar Exercises: Seated   Other Seated Lumbar Exercises  Sitting at edge of mat, anterior/posterior pelvic tilts x 10 reps, cues for technique    Other Seated Lumbar Exercises  Seated lumbar stabilization/abdominal activation:  with cues for neutral posture and abdominal activation:  alternating UE lifts x 5, bilateral UE lifts x 5; bilateral UE side to side motion x 5 reps each side.      Knee/Hip Exercises: Standing   Other Standing Knee Exercises  Standing at sink,  alternating step taps to 4" cabinet shelf x 10 reps.  Then marching in place x 5 reps each.  Forward lunge step x 10 reps (difficulty last 3 reps RLE with hip/knee flexion to return to midline).  Side step/lunge x 10 reps each side, with BUE support.      Knee/Hip Exercises: Seated   Other Seated Knee/Hip Exercises  Seated step out and in, x 10 reps each leg, with cues for increased height lifting leg.       Reviewed HEP from last visit, with pt return demo understanding: -seated ankle pumps x 10 reps -seated marching x 10 reps -seated hamstring stretch, 2 reps x 30 seconds  Used leg lifter, for RLE stepping out and in, to simulate improved ease of R foot clearance getting into and out of car/bed.  Seated lateral weightshifting through hips to encourage increased leg lifting with seated march, seated side step, x 5 reps      PT Education - 06/01/19 0909    Education Details  Added to HEP; discussed benefits of and how to obtain foot-up brace for RLE    Person(s) Educated  Patient    Methods  Explanation;Demonstration;Handout    Comprehension  Verbalized understanding;Returned demonstration       PT Short Term Goals - 05/13/19 1345      PT SHORT TERM GOAL #1   Title  Pt will be independent with HEP to improve balance, strength, gait.    Time  4    Period  Weeks    Status  New    Target Date  06/10/19   May be delayed, if delayed start due to scheduling conflicts     PT SHORT TERM GOAL #2   Title  Pt will improve 5x sit<>stand transfer test to less than or equal to 18 seconds for improved lower extremity strength and transfer efficiency.    Time  4    Period  Weeks    Status  New    Target Date  06/10/19      PT SHORT TERM GOAL #3   Title  Pt will imporve Berg score to at least 38/56 for decreased fall risk.    Time  4    Period  Weeks    Status  New    Target Date  06/10/19      PT SHORT TERM GOAL #4   Title  Pt will verbalize understanding of fall prevention in  the home environment.    Time  4    Period  Weeks  Status  New    Target Date  06/10/19      PT SHORT TERM GOAL #5   Title  Pt will report at least 25% improvement in functional activities such as bed mobility and car transfers.    Time  4    Period  Weeks    Status  New    Target Date  06/10/19        PT Long Term Goals - 05/13/19 1347      PT LONG TERM GOAL #1   Title  Pt will be independent with progression of HEP for improved balance, strength, gait.    Time  6    Period  Weeks    Status  New    Target Date  06/24/19      PT LONG TERM GOAL #2   Title  Pt will improve TUG score to less than or equal to 15 seconds for decreased fall risk.    Time  6    Period  Weeks    Status  New    Target Date  06/24/19      PT LONG TERM GOAL #3   Title  Pt will improve gait velocity to at least 2.5 ft/sec for improved gait eficiency and safety.    Time  6    Period  Weeks    Status  New    Target Date  06/24/19      PT LONG TERM GOAL #4   Title  Pt will verbalize understanding of local MS resources, including exercise options upon d/c from PT.    Time  6    Period  Weeks    Status  New    Target Date  06/24/19            Plan - 06/01/19 0911    Clinical Impression Statement  Reviewed HEP from last visit and added to HEP to address core stability.  Trialed again foot-up brace on RLE, with pt wearing lace up shoes today, with noted improvement in RLE heelstrike and foot clearance with gait.  Pt reports noting improved awareness of RLE foot placement.  Will continue to benefit from skilled PT to address strength, balance, and gait for improved funcitonal mobility.    PT Frequency  2x / week    PT Duration  6 weeks   plus eval   PT Treatment/Interventions  ADLs/Self Care Home Management;Gait training;Neuromuscular re-education;Balance training;Therapeutic exercise;Therapeutic activities;Patient/family education;Functional mobility training;Electrical Stimulation;Orthotic  Fit/Training    PT Next Visit Plan  Review updates to HEP and ask if pt has considered ordering foot-up brace; RLE flexibility, strength, balance; work on bed mobility and lifting legs into and out of car (use of leg lifter?)    Consulted and Agree with Plan of Care  Patient       Patient will benefit from skilled therapeutic intervention in order to improve the following deficits and impairments:  Abnormal gait, Decreased balance, Decreased mobility, Decreased strength, Difficulty walking, Impaired tone  Visit Diagnosis: Muscle weakness (generalized)  Other abnormalities of gait and mobility     Problem List Patient Active Problem List   Diagnosis Date Noted  . Rectal bleeding 12/05/2016  . Internal hemorrhoids 12/05/2016  . Hx of falling 03/17/2016  . Nausea alone 02/13/2014  . Other malaise and fatigue 02/13/2014  . Postmenopausal estrogen deficiency 02/24/2013  . Visit for preventive health examination 02/24/2013  . Family hx osteoporosis 02/21/2013  . Recurrent UTI 01/09/2012  . Suprapubic tenderness 12/01/2011  .  GERD (gastroesophageal reflux disease) 10/15/2011  . Dysphagia, pharyngoesophageal 10/15/2011  . Voiding dysfunction 09/24/2011  . Hypertriglyceridemia 02/09/2011  . VITAMIN D DEFICIENCY 12/13/2008  . Constipation 12/13/2008  . MUSCLE SPASM 12/13/2008  . DIZZINESS 12/13/2008  . HYPOKALEMIA, HX OF 12/13/2008  . DISORDER, ADJUSTMENT W/DEPRESSED MOOD 04/30/2007  . SCLEROSIS, MULTIPLE 04/30/2007  . HYPERTENSION 04/30/2007  . VARICOSE VEIN, LWR EXTREMITIES W/INFLAMMATION 04/30/2007  . VASOMOTOR RHINITIS 04/30/2007    MARRIOTT,AMY W. 06/01/2019, 9:14 AM  Frazier Butt., PT   [facility] [address] [place], [name], [address] Phone: [phone]   Fax:  [phone]  Name: [name] MRN: [id number] Date of Birth: 12/03/1955

## 2019-06-02 ENCOUNTER — Ambulatory Visit: Payer: Medicare Other | Admitting: Physical Therapy

## 2019-06-02 ENCOUNTER — Other Ambulatory Visit: Payer: Self-pay

## 2019-06-02 DIAGNOSIS — M6281 Muscle weakness (generalized): Secondary | ICD-10-CM

## 2019-06-02 DIAGNOSIS — R2689 Other abnormalities of gait and mobility: Secondary | ICD-10-CM | POA: Diagnosis not present

## 2019-06-02 DIAGNOSIS — R2681 Unsteadiness on feet: Secondary | ICD-10-CM | POA: Diagnosis not present

## 2019-06-03 ENCOUNTER — Ambulatory Visit: Payer: Medicare Other | Admitting: Physical Therapy

## 2019-06-03 NOTE — Therapy (Signed)
[facility] [address] [place], [name], [address] Phone: [phone]   Fax:  [phone]  Physical Therapy Treatment  Patient Details  Name: [name] MRN: [id number] Date of Birth: 01-02-1956 Referring Provider (PT): Karl Luke   Encounter Date: 06/02/2019  PT End of Session - 06/03/19 1417    Visit Number  4    Number of Visits  13    Date for PT Re-Evaluation  07/01/19    Authorization Type  [facility] need 10th visit progress note    PT Start Time  0848    PT Stop Time  0928    PT Time Calculation (min)  40 min    Activity Tolerance  Patient tolerated treatment well    Behavior During Therapy  [facility] for tasks assessed/performed       Past Medical History:  Diagnosis Date  . Anemia   . Blood in stool, frank 12/01/2011  . Depressive reaction   . Diverticulitis   . Diverticulosis   . Frequent UTI LAST 7/13  . 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 Mood disorder ([place])   . MS (multiple sclerosis) ([place])   . Rhinitis   . Superficial phlebitis   . Varicose veins     Past Surgical History:  Procedure Laterality Date  . ABDOMINAL HYSTERECTOMY  2008  . BLADDER SURGERY  2008   Tack  . SHOULDER ARTHROSCOPY WITH ROTATOR CUFF REPAIR AND SUBACROMIAL DECOMPRESSION  06/01/2012   Procedure: SHOULDER ARTHROSCOPY WITH ROTATOR CUFF REPAIR AND SUBACROMIAL DECOMPRESSION;  Surgeon: Magnus Sinning, MD;  Location: [facility];  Service: Orthopedics;  Laterality: Left;  LEFT SHOULDER ARTHROSCOPY WITH SUBACROMIAL DECOMPRESION AND DEBRIDEMENT OF PARTIAL ROTATOR CUFF TEAR  . TONSILLECTOMY AND ADENOIDECTOMY      There were no vitals filed for this visit.  Subjective Assessment - 06/02/19 0851    Subjective  Yesterday was not a good day for the shingles.  It will just come and go for a while.    Pertinent History  MS (dx 13 years ago)    Patient Stated Goals  Pt's goals for therapy  are to improve balance and strength in RLE.    Currently in Pain?  Yes    Pain Score  0-No pain    Pain Location  Back    Pain Orientation  Right;Mid    Pain Descriptors / Indicators  Burning    Pain Type  Acute pain    Pain Onset  1 to 4 weeks ago    Aggravating Factors   unsure    Pain Relieving Factors  unsure                       [facility] Adult PT Treatment/Exercise - 06/03/19 1408      Ambulation/Gait   Ambulation/Gait  Yes    Ambulation/Gait Assistance  5: Supervision    Ambulation/Gait Assistance Details  Short distance gait in therapy session today wearing trial of foot-up brace, with improved foot clearance, heelstrike noted.    Ambulation Distance (Feet)  115 Feet   rollator; then 20 ft x 2 no device   Assistive device  Rollator    Gait Pattern  Step-through pattern;Decreased step length - right;Decreased hip/knee flexion - right;Decreased dorsiflexion - right;Poor foot clearance - right   Improved heelstrike/foot clearance with foot-up   Ambulation Surface  Level;Indoor    Pre-Gait Activities  Additional 20 ft x 2, no device,  no foot-up brace, supervision and cues  for improved awareness of R heelstrike and foot clearance.      Lumbar Exercises: Seated   Other Seated Lumbar Exercises  Sitting at edge of mat, anterior/posterior pelvic tilts x 10 reps-reviewed as HEP, pt return demo understanding    Other Seated Lumbar Exercises  Seated lumbar stabilization/abdominal activation:  with cues for neutral posture and abdominal activation:  alternating UE lifts x 5, bilateral UE lifts x 5; bilateral UE side to side motion x 5 reps each side. (Reviewed HEP with pt return demo understanding).  Progressed to seated lumbar stabilization on blue therapy ball:  anterior/posterior pelvic tilts x 10, lateral pelvic tilts x 10 reps, then with abdominal activation (cues for beginning of each set):  alt UE lifts x 5, bilateral UE lifts x 5, UE lifts with trunk rotation x 5 reps.   All with min guard assist from therapist.  With pt having UE supported at mat, pt perfroms seated marching x 10 reps, seated LAQ x 5 reps.        Knee/Hip Exercises: Aerobic   Nustep  Seated, Level 2, 4 extremities x 5 minutes for lower extremity flexibility      Knee/Hip Exercises: Standing   Hip Flexion  AROM;Stengthening;Right;2 sets;10 reps;Knee straight;Knee bent   consecutive legs, alternating legs; single leg march x 5   Functional Squat  1 set;10 reps    Other Standing Knee Exercises  Alternating marching, x 5 reps each leg, 2 sets.  Sidestep/together R and L, 10 reps each direction, focus on R hip/knee flexion for improved foot clearance.    Other Standing Knee Exercises  Step back and weightshift, to focus on ankle dorsiflexion of front foot, x 10 reps.               PT Short Term Goals - 05/13/19 1345      PT SHORT TERM GOAL #1   Title  Pt will be independent with HEP to improve balance, strength, gait.    Time  4    Period  Weeks    Status  New    Target Date  06/10/19   May be delayed, if delayed start due to scheduling conflicts     PT SHORT TERM GOAL #2   Title  Pt will improve 5x sit<>stand transfer test to less than or equal to 18 seconds for improved lower extremity strength and transfer efficiency.    Time  4    Period  Weeks    Status  New    Target Date  06/10/19      PT SHORT TERM GOAL #3   Title  Pt will imporve Berg score to at least 38/56 for decreased fall risk.    Time  4    Period  Weeks    Status  New    Target Date  06/10/19      PT SHORT TERM GOAL #4   Title  Pt will verbalize understanding of fall prevention in the home environment.    Time  4    Period  Weeks    Status  New    Target Date  06/10/19      PT SHORT TERM GOAL #5   Title  Pt will report at least 25% improvement in functional activities such as bed mobility and car transfers.    Time  4    Period  Weeks    Status  New    Target Date  06/10/19        PT Long  Term Goals - 05/13/19 1347      PT LONG TERM GOAL #1   Title  Pt will be independent with progression of HEP for improved balance, strength, gait.    Time  6    Period  Weeks    Status  New    Target Date  06/24/19      PT LONG TERM GOAL #2   Title  Pt will improve TUG score to less than or equal to 15 seconds for decreased fall risk.    Time  6    Period  Weeks    Status  New    Target Date  06/24/19      PT LONG TERM GOAL #3   Title  Pt will improve gait velocity to at least 2.5 ft/sec for improved gait eficiency and safety.    Time  6    Period  Weeks    Status  New    Target Date  06/24/19      PT LONG TERM GOAL #4   Title  Pt will verbalize understanding of local MS resources, including exercise options upon d/c from PT.    Time  6    Period  Weeks    Status  New    Target Date  06/24/19            Plan - 06/03/19 1417    Clinical Impression Statement  Pt has definite improvement with foot clearance and heelstrike with use of trial foot-up brace again today, with pt stating she may look into ordering one.  Worked on seated core stability as well as standing and seated leg stregnthening this visit; pt will continue to benefit from skilled PT to address strength, balance, gait for improved functional mobility.    PT Frequency  2x / week    PT Duration  6 weeks   plus eval   PT Treatment/Interventions  ADLs/Self Care Home Management;Gait training;Neuromuscular re-education;Balance training;Therapeutic exercise;Therapeutic activities;Patient/family education;Functional mobility training;Electrical Stimulation;Orthotic Fit/Training    PT Next Visit Plan  RLE flexibility, standing strength/balance exercises added to HEP; work on bed mobility and lifting legs into and out of car (use of leg lifter?)    Consulted and Agree with Plan of Care  Patient       Patient will benefit from skilled therapeutic intervention in order to improve the following deficits and impairments:   Abnormal gait, Decreased balance, Decreased mobility, Decreased strength, Difficulty walking, Impaired tone  Visit Diagnosis: Muscle weakness (generalized)  Other abnormalities of gait and mobility     Problem List Patient Active Problem List   Diagnosis Date Noted  . Rectal bleeding 12/05/2016  . Internal hemorrhoids 12/05/2016  . Hx of falling 03/17/2016  . Nausea alone 02/13/2014  . Other malaise and fatigue 02/13/2014  . Postmenopausal estrogen deficiency 02/24/2013  . Visit for preventive health examination 02/24/2013  . Family hx osteoporosis 02/21/2013  . Recurrent UTI 01/09/2012  . Suprapubic tenderness 12/01/2011  . GERD (gastroesophageal reflux disease) 10/15/2011  . Dysphagia, pharyngoesophageal 10/15/2011  . Voiding dysfunction 09/24/2011  . Hypertriglyceridemia 02/09/2011  . VITAMIN D DEFICIENCY 12/13/2008  . Constipation 12/13/2008  . MUSCLE SPASM 12/13/2008  . DIZZINESS 12/13/2008  . HYPOKALEMIA, HX OF 12/13/2008  . DISORDER, ADJUSTMENT W/DEPRESSED MOOD 04/30/2007  . SCLEROSIS, MULTIPLE 04/30/2007  . HYPERTENSION 04/30/2007  . VARICOSE VEIN, LWR EXTREMITIES W/INFLAMMATION 04/30/2007  . VASOMOTOR RHINITIS 04/30/2007    Dajane Valli W.  06/03/2019, 2:25 PM Frazier Butt., PT  [facility] [address] [place], [name], [address] Phone: [phone]   Fax:  [phone]  Name: ROSLIND RAMP MRN: [id number] Date of Birth: August 15, 1955

## 2019-06-08 DIAGNOSIS — Z79899 Other long term (current) drug therapy: Secondary | ICD-10-CM | POA: Diagnosis not present

## 2019-06-08 DIAGNOSIS — E559 Vitamin D deficiency, unspecified: Secondary | ICD-10-CM | POA: Diagnosis not present

## 2019-06-08 DIAGNOSIS — Z8262 Family history of osteoporosis: Secondary | ICD-10-CM | POA: Diagnosis not present

## 2019-06-08 DIAGNOSIS — M8580 Other specified disorders of bone density and structure, unspecified site: Secondary | ICD-10-CM | POA: Diagnosis not present

## 2019-06-08 DIAGNOSIS — Z8489 Family history of other specified conditions: Secondary | ICD-10-CM | POA: Diagnosis not present

## 2019-06-09 ENCOUNTER — Ambulatory Visit: Payer: Medicare Other | Admitting: Physical Therapy

## 2019-06-09 ENCOUNTER — Other Ambulatory Visit: Payer: Self-pay

## 2019-06-09 ENCOUNTER — Encounter: Payer: Self-pay | Admitting: Physical Therapy

## 2019-06-09 DIAGNOSIS — R2681 Unsteadiness on feet: Secondary | ICD-10-CM | POA: Diagnosis not present

## 2019-06-09 DIAGNOSIS — M6281 Muscle weakness (generalized): Secondary | ICD-10-CM

## 2019-06-09 DIAGNOSIS — R2689 Other abnormalities of gait and mobility: Secondary | ICD-10-CM | POA: Diagnosis not present

## 2019-06-09 NOTE — Therapy (Signed)
[facility] [address] [place], [name], [address] Phone: [phone]   Fax:  [phone]  Physical Therapy Treatment  Patient Details  Name: [name] MRN: [id number] Date of Birth: [birth year] Referring Provider (PT): Karl Luke   Encounter Date: 06/09/2019  PT End of Session - 06/09/19 1513    Visit Number  5    Number of Visits  13    Date for PT Re-Evaluation  07/01/19    Authorization Type  [place] need 10th visit progress note    PT Start Time  0847    PT Stop Time  0927    PT Time Calculation (min)  40 min    Activity Tolerance  Patient tolerated treatment well    Behavior During Therapy  [facility] for tasks assessed/performed       Past Medical History:  Diagnosis Date  . Anemia   . Blood in stool, frank 12/01/2011  . Depressive reaction   . Diverticulitis   . Diverticulosis   . Frequent UTI LAST 7/13  . 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 Mood disorder ([place])   . MS (multiple sclerosis) ([place])   . Rhinitis   . Superficial phlebitis   . Varicose veins     Past Surgical History:  Procedure Laterality Date  . ABDOMINAL HYSTERECTOMY  2008  . BLADDER SURGERY  2008   Tack  . SHOULDER ARTHROSCOPY WITH ROTATOR CUFF REPAIR AND SUBACROMIAL DECOMPRESSION  06/01/2012   Procedure: SHOULDER ARTHROSCOPY WITH ROTATOR CUFF REPAIR AND SUBACROMIAL DECOMPRESSION;  Surgeon: Magnus Sinning, MD;  Location: [facility];  Service: Orthopedics;  Laterality: Left;  LEFT SHOULDER ARTHROSCOPY WITH SUBACROMIAL DECOMPRESION AND DEBRIDEMENT OF PARTIAL ROTATOR CUFF TEAR  . TONSILLECTOMY AND ADENOIDECTOMY      There were no vitals filed for this visit.  Subjective Assessment - 06/09/19 0849    Subjective  HAd to bring in my cane today instead of walker due to all the rain.  Brought in the brace so you can fit into my new shoes.    Pertinent History  MS (dx 13 years ago)    Patient  Stated Goals  Pt's goals for therapy are to improve balance and strength in RLE.    Currently in Pain?  No/denies    Pain Onset  1 to 4 weeks ago                       [facility] Adult PT Treatment/Exercise - 06/09/19 0001      Ambulation/Gait   Ambulation/Gait  Yes    Ambulation/Gait Assistance  5: Supervision    Ambulation Distance (Feet)  115 Feet   x 2; 100 ft rollator, then 115 ft with cane   Assistive device  Rollator;Straight cane    Gait Pattern  Step-through pattern;Decreased step length - right;Decreased hip/knee flexion - right;Decreased dorsiflexion - right;Poor foot clearance - right    Ambulation Surface  Level;Indoor    Pre-Gait Activities  Instructed pt in donning/doffing foot-up brace, with pt return demo understanding, needing min assist to tighten the lower leg strap enough      Lumbar Exercises: Seated   Other Seated Lumbar Exercises  Sitting at edge of mat, anterior/posterior pelvic tilts x 10 reps-reviewed as HEP, pt return demo understanding    Other Seated Lumbar Exercises  Seated lumbar stabilization/abdominal activation:  with cues for neutral posture and abdominal activation:  Seated on blue therapy ball:  anterior/posterior pelvic tilts x 10, lateral pelvic tilts x 10 reps, then with abdominal activation (cues for beginning of each set):  alt UE lifts x 5, bilateral UE lifts x 5, UE lifts with trunk rotation x 5 reps; 2 sets.  All with min guard assist from therapist.  With pt having UE supported at mat, pt perfroms seated marching x 5 reps, seated LAQ x 5 reps, x 2 sets.  Seated on blue therapy ball, cues for abdominal activation, diagonal UE lifts x 5 reps each side, then UE lifts of 2.2 lb ball x 10 reps.          Balance Exercises - 06/09/19 0923      Balance Exercises: Standing   Other Standing Exercises  STagger stance forward and back weigthshfiting x 10 reps.  Alternating step taps at 6" step, then repeated step taps with forward lean to  encourage hip/knee flexion.  SLS RLE with LLE propped at 6" step with alternating UE lifts x 10, then bilat UE lifts x 10, then head turns/head nods x 5.  Sidestep taps to 6" step x 10 reps with 1 UE support.            PT Short Term Goals - 05/13/19 1345      PT SHORT TERM GOAL #1   Title  Pt will be independent with HEP to improve balance, strength, gait.    Time  4    Period  Weeks    Status  New    Target Date  06/10/19   May be delayed, if delayed start due to scheduling conflicts     PT SHORT TERM GOAL #2   Title  Pt will improve 5x sit<>stand transfer test to less than or equal to 18 seconds for improved lower extremity strength and transfer efficiency.    Time  4    Period  Weeks    Status  New    Target Date  06/10/19      PT SHORT TERM GOAL #3   Title  Pt will imporve Berg score to at least 38/56 for decreased fall risk.    Time  4    Period  Weeks    Status  New    Target Date  06/10/19      PT SHORT TERM GOAL #4   Title  Pt will verbalize understanding of fall prevention in the home environment.    Time  4    Period  Weeks    Status  New    Target Date  06/10/19      PT SHORT TERM GOAL #5   Title  Pt will report at least 25% improvement in functional activities such as bed mobility and car transfers.    Time  4    Period  Weeks    Status  New    Target Date  06/10/19        PT Long Term Goals - 05/13/19 1347      PT LONG TERM GOAL #1   Title  Pt will be independent with progression of HEP for improved balance, strength, gait.    Time  6    Period  Weeks    Status  New    Target Date  06/24/19      PT LONG TERM GOAL #2   Title  Pt will improve TUG score to less than or equal to 15 seconds for decreased fall risk.    Time  6  Period  Weeks    Status  New    Target Date  06/24/19      PT LONG TERM GOAL #3   Title  Pt will improve gait velocity to at least 2.5 ft/sec for improved gait eficiency and safety.    Time  6    Period  Weeks     Status  New    Target Date  06/24/19      PT LONG TERM GOAL #4   Title  Pt will verbalize understanding of local MS resources, including exercise options upon d/c from PT.    Time  6    Period  Weeks    Status  New    Target Date  06/24/19            Plan - 06/09/19 1515    Clinical Impression Statement  Pt brings in her own foot-up brace today and instructed in donning/doffing; how to secure under laces of her shoe.  Worked on seated core stability as well as balance exercises in standing.  Pt will continue to beneift from skilled PT to address balance, strength and gait for overall improved mobility.    PT Frequency  2x / week    PT Duration  6 weeks   plus eval   PT Treatment/Interventions  ADLs/Self Care Home Management;Gait training;Neuromuscular re-education;Balance training;Therapeutic exercise;Therapeutic activities;Patient/family education;Functional mobility training;Electrical Stimulation;Orthotic Fit/Training    PT Next Visit Plan  RLE flexibility, cores stability, standing strength/balance exercises added to HEP; work on bed mobility and lifting legs into and out of car (use of leg lifter?)    Consulted and Agree with Plan of Care  Patient       Patient will benefit from skilled therapeutic intervention in order to improve the following deficits and impairments:  Abnormal gait, Decreased balance, Decreased mobility, Decreased strength, Difficulty walking, Impaired tone  Visit Diagnosis: Muscle weakness (generalized)  Other abnormalities of gait and mobility  Unsteadiness on feet     Problem List Patient Active Problem List   Diagnosis Date Noted  . Rectal bleeding 12/05/2016  . Internal hemorrhoids 12/05/2016  . Hx of falling 03/17/2016  . Nausea alone 02/13/2014  . Other malaise and fatigue 02/13/2014  . Postmenopausal estrogen deficiency 02/24/2013  . Visit for preventive health examination 02/24/2013  . Family hx osteoporosis 02/21/2013  . Recurrent  UTI 01/09/2012  . Suprapubic tenderness 12/01/2011  . GERD (gastroesophageal reflux disease) 10/15/2011  . Dysphagia, pharyngoesophageal 10/15/2011  . Voiding dysfunction 09/24/2011  . Hypertriglyceridemia 02/09/2011  . VITAMIN D DEFICIENCY 12/13/2008  . Constipation 12/13/2008  . MUSCLE SPASM 12/13/2008  . DIZZINESS 12/13/2008  . HYPOKALEMIA, HX OF 12/13/2008  . DISORDER, ADJUSTMENT W/DEPRESSED MOOD 04/30/2007  . SCLEROSIS, MULTIPLE 04/30/2007  . HYPERTENSION 04/30/2007  . VARICOSE VEIN, LWR EXTREMITIES W/INFLAMMATION 04/30/2007  . VASOMOTOR RHINITIS 04/30/2007    MARRIOTT,AMY W. 06/09/2019, 3:19 PM  Frazier Butt., PT   [facility] [address] [place], [name], [address] Phone: [phone]   Fax:  [phone]  Name: [name] MRN: [id number] Date of Birth: 15-May-1956

## 2019-06-10 ENCOUNTER — Encounter: Payer: Self-pay | Admitting: Physical Therapy

## 2019-06-10 ENCOUNTER — Other Ambulatory Visit: Payer: Self-pay

## 2019-06-10 ENCOUNTER — Ambulatory Visit: Payer: Medicare Other | Admitting: Physical Therapy

## 2019-06-10 DIAGNOSIS — R2689 Other abnormalities of gait and mobility: Secondary | ICD-10-CM | POA: Diagnosis not present

## 2019-06-10 DIAGNOSIS — R2681 Unsteadiness on feet: Secondary | ICD-10-CM

## 2019-06-10 DIAGNOSIS — M6281 Muscle weakness (generalized): Secondary | ICD-10-CM | POA: Diagnosis not present

## 2019-06-10 DIAGNOSIS — R339 Retention of urine, unspecified: Secondary | ICD-10-CM | POA: Diagnosis not present

## 2019-06-10 NOTE — Patient Instructions (Addendum)
Access Code: [email or web address]  URL: [email or web address]  Date: 06/10/2019  Prepared by: Mady Haagensen   Exercises Supine Quadriceps Stretch on Table - 3 reps - 1 sets - 15-30 sec hold - 1x daily - 5x weekly Hip Flexor Stretch on Step - 10 reps - 1 sets - 5-10 sec hold - 1x daily - 5x weekly Alternating Step Taps with Counter Support - 10 reps - 1 sets - 1x daily - 5x weekly Sideways Step Touch - 10 reps - 1 sets - 1x daily - 5x weekly

## 2019-06-10 NOTE — Therapy (Signed)
[facility] [address] [place], [name], [address] Phone: [phone]   Fax:  [phone]  Physical Therapy Treatment  Patient Details  Name: [name] MRN: [id number] Date of Birth: 12-28-1955 Referring Provider (PT): Karl Luke   Encounter Date: 06/10/2019  PT End of Session - 06/10/19 1459    Visit Number  6    Number of Visits  13    Date for PT Re-Evaluation  07/01/19    Authorization Type  [place] need 10th visit progress note    PT Start Time  0848    PT Stop Time  0930    PT Time Calculation (min)  42 min    Activity Tolerance  Patient tolerated treatment well    Behavior During Therapy  [facility] for tasks assessed/performed       Past Medical History:  Diagnosis Date  . Anemia   . Blood in stool, frank 12/01/2011  . Depressive reaction   . Diverticulitis   . Diverticulosis   . Frequent UTI LAST 7/13  . 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 Mood disorder ([place])   . MS (multiple sclerosis) ([place])   . Rhinitis   . Superficial phlebitis   . Varicose veins     Past Surgical History:  Procedure Laterality Date  . ABDOMINAL HYSTERECTOMY  2008  . BLADDER SURGERY  2008   Tack  . SHOULDER ARTHROSCOPY WITH ROTATOR CUFF REPAIR AND SUBACROMIAL DECOMPRESSION  06/01/2012   Procedure: SHOULDER ARTHROSCOPY WITH ROTATOR CUFF REPAIR AND SUBACROMIAL DECOMPRESSION;  Surgeon: Magnus Sinning, MD;  Location: [facility];  Service: Orthopedics;  Laterality: Left;  LEFT SHOULDER ARTHROSCOPY WITH SUBACROMIAL DECOMPRESION AND DEBRIDEMENT OF PARTIAL ROTATOR CUFF TEAR  . TONSILLECTOMY AND ADENOIDECTOMY      There were no vitals filed for this visit.  Subjective Assessment - 06/10/19 0853    Subjective  No changes since yesterday.  Left foot-up brace on most of the day yesterday.    Pertinent History  MS (dx 13 years ago)    Patient Stated Goals  Pt's goals for therapy are to  improve balance and strength in RLE.    Currently in Pain?  No/denies    Pain Onset  1 to 4 weeks ago                       [facility] Adult PT Treatment/Exercise - 06/10/19 0001      Ambulation/Gait   Ambulation/Gait  Yes    Ambulation/Gait Assistance  5: Supervision    Ambulation/Gait Assistance Details  2nd bout of gait with conversation tasks, using cane today.  At last 50-75 ft, pt noted to have decreased timing of RLE swing through with less foot clearance.    Ambulation Distance (Feet)  230 Feet   x 2   Assistive device  Straight cane    Gait Pattern  Step-through pattern;Decreased step length - right;Decreased hip/knee flexion - right;Decreased dorsiflexion - right    Ambulation Surface  Level;Indoor    Pre-Gait Activities  Discussed with patient that rollator continues to give her the most optimal support, and would be most ideal for longer distances and outdoors, as well as indoors on days/times when she is more fatigued or off-balance.  She appears to have improved R foot clearance, with improved safety and gait awareness with addition of foot-up, to be able to use cane for short, household distances.  However, cautioned patient that with  additions of dual tasking, fatigue, or distractions, she is at higher risk of falling with just use of her cane.      Knee/Hip Exercises: Stretches   Hip Flexor Stretch  Right;2 reps;20 seconds   supine edge of mat   Hip Flexor Stretch Limitations  Progressed to supine hip/knee flexion on/off side of mat x 5 reps      Knee/Hip Exercises: Aerobic   Nustep  Seated, Level 3, 4 extremities x 8 minutes for lower extremity flexibility and strengthening.          Balance Exercises - 06/10/19 1457      Balance Exercises: Standing   Other Standing Exercises  Alternating step taps at 6" step x 10 reps, then repeated step taps with forward lean to encourage hip/knee flexion x 10 reps.  SLS RLE with LLE propped at 6" step with alternating  UE lifts x 10, then head turns/head nods x 5.  Sidestep taps to 6" step x 10 reps with 1 UE support.          PT Education - 06/10/19 1459    Education Details  Additions for balance HEP-see instructions    Person(s) Educated  Patient    Methods  Explanation;Handout;Demonstration    Comprehension  Verbalized understanding;Returned demonstration;Verbal cues required       PT Short Term Goals - 05/13/19 1345      PT SHORT TERM GOAL #1   Title  Pt will be independent with HEP to improve balance, strength, gait.    Time  4    Period  Weeks    Status  New    Target Date  06/10/19   May be delayed, if delayed start due to scheduling conflicts     PT SHORT TERM GOAL #2   Title  Pt will improve 5x sit<>stand transfer test to less than or equal to 18 seconds for improved lower extremity strength and transfer efficiency.    Time  4    Period  Weeks    Status  New    Target Date  06/10/19      PT SHORT TERM GOAL #3   Title  Pt will imporve Berg score to at least 38/56 for decreased fall risk.    Time  4    Period  Weeks    Status  New    Target Date  06/10/19      PT SHORT TERM GOAL #4   Title  Pt will verbalize understanding of fall prevention in the home environment.    Time  4    Period  Weeks    Status  New    Target Date  06/10/19      PT SHORT TERM GOAL #5   Title  Pt will report at least 25% improvement in functional activities such as bed mobility and car transfers.    Time  4    Period  Weeks    Status  New    Target Date  06/10/19        PT Long Term Goals - 05/13/19 1347      PT LONG TERM GOAL #1   Title  Pt will be independent with progression of HEP for improved balance, strength, gait.    Time  6    Period  Weeks    Status  New    Target Date  06/24/19      PT LONG TERM GOAL #2   Title  Pt will improve TUG  score to less than or equal to 15 seconds for decreased fall risk.    Time  6    Period  Weeks    Status  New    Target Date  06/24/19       PT LONG TERM GOAL #3   Title  Pt will improve gait velocity to at least 2.5 ft/sec for improved gait eficiency and safety.    Time  6    Period  Weeks    Status  New    Target Date  06/24/19      PT LONG TERM GOAL #4   Title  Pt will verbalize understanding of local MS resources, including exercise options upon d/c from PT.    Time  6    Period  Weeks    Status  New    Target Date  06/24/19            Plan - 06/10/19 1500    Clinical Impression Statement  Focus of today's skilled PT session on standing balance exercises for appropriate additions to HEP.  Also address flexibility with use of NuStep as well as supine and standing quadriceps flexibility.  While pt does have improved overall R foot clearance and attention to RLE movement with gait, distractions and fatigue may contribute to quick LOB and therefore, PT has cautioned paitent to cotinue to use rollator primarily, cane only for very short hosuehold distances.    PT Frequency  2x / week    PT Duration  6 weeks   plus eval   PT Treatment/Interventions  ADLs/Self Care Home Management;Gait training;Neuromuscular re-education;Balance training;Therapeutic exercise;Therapeutic activities;Patient/family education;Functional mobility training;Electrical Stimulation;Orthotic Fit/Training    PT Next Visit Plan  Review standing balance exercises added to HEP; work on bed mobility, lifting legs into/out of car; RLE flexibility and core stability.    Consulted and Agree with Plan of Care  Patient       Patient will benefit from skilled therapeutic intervention in order to improve the following deficits and impairments:  Abnormal gait, Decreased balance, Decreased mobility, Decreased strength, Difficulty walking, Impaired tone  Visit Diagnosis: Muscle weakness (generalized)  Unsteadiness on feet  Other abnormalities of gait and mobility     Problem List Patient Active Problem List   Diagnosis Date Noted  . Rectal bleeding  12/05/2016  . Internal hemorrhoids 12/05/2016  . Hx of falling 03/17/2016  . Nausea alone 02/13/2014  . Other malaise and fatigue 02/13/2014  . Postmenopausal estrogen deficiency 02/24/2013  . Visit for preventive health examination 02/24/2013  . Family hx osteoporosis 02/21/2013  . Recurrent UTI 01/09/2012  . Suprapubic tenderness 12/01/2011  . GERD (gastroesophageal reflux disease) 10/15/2011  . Dysphagia, pharyngoesophageal 10/15/2011  . Voiding dysfunction 09/24/2011  . Hypertriglyceridemia 02/09/2011  . VITAMIN D DEFICIENCY 12/13/2008  . Constipation 12/13/2008  . MUSCLE SPASM 12/13/2008  . DIZZINESS 12/13/2008  . HYPOKALEMIA, HX OF 12/13/2008  . DISORDER, ADJUSTMENT W/DEPRESSED MOOD 04/30/2007  . SCLEROSIS, MULTIPLE 04/30/2007  . HYPERTENSION 04/30/2007  . VARICOSE VEIN, LWR EXTREMITIES W/INFLAMMATION 04/30/2007  . VASOMOTOR RHINITIS 04/30/2007    Azuree Minish W. 06/10/2019, 3:07 PM  Frazier Butt., PT   [facility] [address] [place], [name], [address] Phone: [phone]   Fax:  [phone]  Name: [name] MRN: [id number] Date of Birth: Aug 03, 1955

## 2019-06-13 ENCOUNTER — Other Ambulatory Visit: Payer: Self-pay

## 2019-06-13 DIAGNOSIS — Z8262 Family history of osteoporosis: Secondary | ICD-10-CM | POA: Diagnosis not present

## 2019-06-13 DIAGNOSIS — Z20822 Contact with and (suspected) exposure to covid-19: Secondary | ICD-10-CM

## 2019-06-13 DIAGNOSIS — Z79899 Other long term (current) drug therapy: Secondary | ICD-10-CM | POA: Diagnosis not present

## 2019-06-13 DIAGNOSIS — E559 Vitamin D deficiency, unspecified: Secondary | ICD-10-CM | POA: Diagnosis not present

## 2019-06-13 DIAGNOSIS — Z8489 Family history of other specified conditions: Secondary | ICD-10-CM | POA: Diagnosis not present

## 2019-06-13 DIAGNOSIS — M8580 Other specified disorders of bone density and structure, unspecified site: Secondary | ICD-10-CM | POA: Diagnosis not present

## 2019-06-14 ENCOUNTER — Ambulatory Visit: Payer: Medicare Other | Admitting: Physical Therapy

## 2019-06-15 LAB — NOVEL CORONAVIRUS, NAA: SARS-CoV-2, NAA: NOT DETECTED

## 2019-06-17 ENCOUNTER — Other Ambulatory Visit: Payer: Self-pay

## 2019-06-17 ENCOUNTER — Ambulatory Visit: Payer: Medicare Other | Admitting: Physical Therapy

## 2019-06-17 ENCOUNTER — Encounter: Payer: Self-pay | Admitting: Physical Therapy

## 2019-06-17 DIAGNOSIS — R2689 Other abnormalities of gait and mobility: Secondary | ICD-10-CM

## 2019-06-17 DIAGNOSIS — M6281 Muscle weakness (generalized): Secondary | ICD-10-CM

## 2019-06-17 DIAGNOSIS — R2681 Unsteadiness on feet: Secondary | ICD-10-CM | POA: Diagnosis not present

## 2019-06-17 NOTE — Patient Instructions (Addendum)

## 2019-06-17 NOTE — Therapy (Signed)
[facility] [address] [place], [name], [address] Phone: [phone]   Fax:  [phone]  Physical Therapy Treatment  Patient Details  Name: [name] MRN: [id number] Date of Birth: [birth year] Referring Provider (PT): Karl Luke   Encounter Date: 06/17/2019  PT End of Session - 06/17/19 2013    Visit Number  7    Number of Visits  13    Date for PT Re-Evaluation  07/01/19    Authorization Type  [facility] need 10th visit progress note    PT Start Time  0804    PT Stop Time  0844    PT Time Calculation (min)  40 min    Activity Tolerance  Patient tolerated treatment well    Behavior During Therapy  [facility] for tasks assessed/performed       Past Medical History:  Diagnosis Date  . Anemia   . Blood in stool, frank 12/01/2011  . Depressive reaction   . Diverticulitis   . Diverticulosis   . Frequent UTI LAST 7/13  . 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 Mood disorder ([place])   . MS (multiple sclerosis) ([place])   . Rhinitis   . Superficial phlebitis   . Varicose veins     Past Surgical History:  Procedure Laterality Date  . ABDOMINAL HYSTERECTOMY  2008  . BLADDER SURGERY  2008   Tack  . SHOULDER ARTHROSCOPY WITH ROTATOR CUFF REPAIR AND SUBACROMIAL DECOMPRESSION  06/01/2012   Procedure: SHOULDER ARTHROSCOPY WITH ROTATOR CUFF REPAIR AND SUBACROMIAL DECOMPRESSION;  Surgeon: Magnus Sinning, MD;  Location: [facility];  Service: Orthopedics;  Laterality: Left;  LEFT SHOULDER ARTHROSCOPY WITH SUBACROMIAL DECOMPRESION AND DEBRIDEMENT OF PARTIAL ROTATOR CUFF TEAR  . TONSILLECTOMY AND ADENOIDECTOMY      There were no vitals filed for this visit.  Subjective Assessment - 06/17/19 0807    Subjective  Just having a bad week.  Not sure what's going on, just stiffer than usual.    Pertinent History  MS (dx 13 years ago)    Patient Stated Goals  Pt's goals for therapy are to  improve balance and strength in RLE.    Currently in Pain?  Yes    Pain Score  2     Pain Location  --   Trunk   Pain Orientation  Right;Mid;Lower    Pain Descriptors / Indicators  Burning    Pain Type  Acute pain    Pain Onset  1 to 4 weeks ago    Pain Frequency  Intermittent    Aggravating Factors   unsure    Pain Relieving Factors  unsure                       [facility] Adult PT Treatment/Exercise - 06/17/19 0001      Transfers   Transfers  Sit to Stand;Stand to Sit    Sit to Stand  6: Modified independent (Device/Increase time);With upper extremity assist;From chair/3-in-1    Five time sit to stand comments   18.25    with UE support   Stand to Sit  6: Modified independent (Device/Increase time);With upper extremity assist;To chair/3-in-1      Standardized Balance Assessment   Standardized Balance Assessment  Berg Balance Test      Berg Balance Test   Sit to Stand  Able to stand  independently using hands    Standing Unsupported  Able to stand safely 2  minutes    Sitting with Back Unsupported but Feet Supported on Floor or Stool  Able to sit safely and securely 2 minutes    Stand to Sit  Controls descent by using hands    Transfers  Able to transfer safely, definite need of hands    Standing Unsupported with Eyes Closed  Able to stand 10 seconds with supervision    Standing Ubsupported with Feet Together  Needs help to attain position but able to stand for 30 seconds with feet together    From Standing, Reach Forward with Outstretched Arm  Can reach confidently >25 cm (10")    From Standing Position, Pick up Object from Floor  Able to pick up shoe, needs supervision    From Standing Position, Turn to Look Behind Over each Shoulder  Looks behind from both sides and weight shifts well    Turn 360 Degrees  Able to turn 360 degrees safely but slowly   5.63, 5.06   Standing Unsupported, Alternately Place Feet on Step/Stool  Able to complete >2 steps/needs minimal  assist    Standing Unsupported, One Foot in Front  Able to plae foot ahead of the other independently and hold 30 seconds    Standing on One Leg  Tries to lift leg/unable to hold 3 seconds but remains standing independently    Total Score  39      Self-Care   Self-Care  Other Self-Care Comments    Other Self-Care Comments   Fall prevention education provided-see handout in instructions; also discussed fatigue in relation to MS and to avoid pushing through fatigue to avoid falls.  Discussed fall risk and Berg score and ultimately discussed optimal device for gait safety is the rollator.  Pt in agreement.      Knee/Hip Exercises: Aerobic   Other Aerobic  Seated foot pedaler, x 5 minutes forward, x 30 seconds back.               PT Education - 06/17/19 2012    Education Details  Fall prevention, progress towars goals, POC    Person(s) Educated  Patient    Methods  Explanation;Handout    Comprehension  Verbalized understanding       PT Short Term Goals - 06/17/19 0810      PT SHORT TERM GOAL #1   Title  Pt will be independent with HEP to improve balance, strength, gait.    Time  4    Period  Weeks    Status  Achieved    Target Date  06/10/19   May be delayed, if delayed start due to scheduling conflicts     PT SHORT TERM GOAL #2   Title  Pt will improve 5x sit<>stand transfer test to less than or equal to 18 seconds for improved lower extremity strength and transfer efficiency.    Time  4    Period  Weeks    Status  Partially Met    Target Date  06/10/19      PT SHORT TERM GOAL #3   Title  Pt will imporve Berg score to at least 38/56 for decreased fall risk.    Time  4    Period  Weeks    Status  Achieved    Target Date  06/10/19      PT SHORT TERM GOAL #4   Title  Pt will verbalize understanding of fall prevention in the home environment.    Time  4    Period  Weeks    Status  Achieved    Target Date  06/10/19      PT SHORT TERM GOAL #5   Title  Pt will report  at least 25% improvement in functional activities such as bed mobility and car transfers.    Time  4    Period  Weeks    Status  On-going    Target Date  06/10/19        PT Long Term Goals - 05/13/19 1347      PT LONG TERM GOAL #1   Title  Pt will be independent with progression of HEP for improved balance, strength, gait.    Time  6    Period  Weeks    Status  New    Target Date  06/24/19      PT LONG TERM GOAL #2   Title  Pt will improve TUG score to less than or equal to 15 seconds for decreased fall risk.    Time  6    Period  Weeks    Status  New    Target Date  06/24/19      PT LONG TERM GOAL #3   Title  Pt will improve gait velocity to at least 2.5 ft/sec for improved gait eficiency and safety.    Time  6    Period  Weeks    Status  New    Target Date  06/24/19      PT LONG TERM GOAL #4   Title  Pt will verbalize understanding of local MS resources, including exercise options upon d/c from PT.    Time  6    Period  Weeks    Status  New    Target Date  06/24/19            Plan - 06/17/19 2014    Clinical Impression Statement  Assessed STGs this visit, with pt meeting STG 1, 3 (Berg), 4 (fall prevention).  STG 2 partially met, with improvement noted in 5x sit<>stand, but not to goal level.  Pt has demonstrated improvement in Berg Balance score, but remains at fall risk.  Trialed foot pedaler as potential option ofr flexibility exercise at home, as pt is now unsure if she will join and go to [employer], given recent COVID increase.  Pt will continue to benefit from skilled PT to address balance, strength, and gait, towards LTGs.    PT Frequency  2x / week    PT Duration  6 weeks   plus eval   PT Treatment/Interventions  ADLs/Self Care Home Management;Gait training;Neuromuscular re-education;Balance training;Therapeutic exercise;Therapeutic activities;Patient/family education;Functional mobility training;Electrical Stimulation;Orthotic Fit/Training    PT Next  Visit Plan  Review standing balance exercises added to HEP; work on bed mobility, lifting legs into/out of car; RLE flexibility and core stability.    Consulted and Agree with Plan of Care  Patient       Patient will benefit from skilled therapeutic intervention in order to improve the following deficits and impairments:  Abnormal gait, Decreased balance, Decreased mobility, Decreased strength, Difficulty walking, Impaired tone  Visit Diagnosis: Unsteadiness on feet  Muscle weakness (generalized)  Other abnormalities of gait and mobility     Problem List Patient Active Problem List   Diagnosis Date Noted  . Rectal bleeding 12/05/2016  . Internal hemorrhoids 12/05/2016  . Hx of falling 03/17/2016  . Nausea alone 02/13/2014  . Other malaise and fatigue 02/13/2014  . Postmenopausal estrogen deficiency 02/24/2013  .  Visit for preventive health examination 02/24/2013  . Family hx osteoporosis 02/21/2013  . Recurrent UTI 01/09/2012  . Suprapubic tenderness 12/01/2011  . GERD (gastroesophageal reflux disease) 10/15/2011  . Dysphagia, pharyngoesophageal 10/15/2011  . Voiding dysfunction 09/24/2011  . Hypertriglyceridemia 02/09/2011  . VITAMIN D DEFICIENCY 12/13/2008  . Constipation 12/13/2008  . MUSCLE SPASM 12/13/2008  . DIZZINESS 12/13/2008  . HYPOKALEMIA, HX OF 12/13/2008  . DISORDER, ADJUSTMENT W/DEPRESSED MOOD 04/30/2007  . SCLEROSIS, MULTIPLE 04/30/2007  . HYPERTENSION 04/30/2007  . VARICOSE VEIN, LWR EXTREMITIES W/INFLAMMATION 04/30/2007  . VASOMOTOR RHINITIS 04/30/2007    Irl Bodie W. 06/17/2019, 8:17 PM  Frazier Butt., PT   [facility] [address] [place], [name], [address] Phone: [phone]   Fax:  [phone]  Name: LADA FULBRIGHT MRN: [id number] Date of Birth: 1956/07/13

## 2019-06-29 DIAGNOSIS — Z79899 Other long term (current) drug therapy: Secondary | ICD-10-CM | POA: Diagnosis not present

## 2019-06-29 DIAGNOSIS — G35 Multiple sclerosis: Secondary | ICD-10-CM | POA: Diagnosis not present

## 2019-06-29 DIAGNOSIS — M79601 Pain in right arm: Secondary | ICD-10-CM | POA: Diagnosis not present

## 2019-06-30 DIAGNOSIS — Z1231 Encounter for screening mammogram for malignant neoplasm of breast: Secondary | ICD-10-CM | POA: Diagnosis not present

## 2019-07-04 ENCOUNTER — Encounter: Payer: Self-pay | Admitting: Physical Therapy

## 2019-07-04 ENCOUNTER — Ambulatory Visit: Payer: Medicare Other | Attending: Neurology | Admitting: Physical Therapy

## 2019-07-04 ENCOUNTER — Other Ambulatory Visit: Payer: Self-pay

## 2019-07-04 DIAGNOSIS — M6281 Muscle weakness (generalized): Secondary | ICD-10-CM | POA: Diagnosis not present

## 2019-07-04 DIAGNOSIS — R2681 Unsteadiness on feet: Secondary | ICD-10-CM | POA: Diagnosis not present

## 2019-07-04 DIAGNOSIS — R2689 Other abnormalities of gait and mobility: Secondary | ICD-10-CM

## 2019-07-04 DIAGNOSIS — R293 Abnormal posture: Secondary | ICD-10-CM | POA: Insufficient documentation

## 2019-07-04 NOTE — Therapy (Signed)
[facility] [address] [place], [name], [address] Phone: [phone]   Fax:  [phone]  Physical Therapy Treatment  Patient Details  Name: [name] MRN: [id number] Date of Birth: 08/26/1955 Referring Provider (PT): Karl Luke   Encounter Date: 07/04/2019  PT End of Session - 07/04/19 1506    Visit Number  8    Number of Visits  13    Date for PT Re-Evaluation  03/88/82   recert to cover current [facility] need 10th visit progress note    PT Start Time  0722    PT Stop Time  0800    PT Time Calculation (min)  38 min    Activity Tolerance  Patient tolerated treatment well    Behavior During Therapy  [facility] for tasks assessed/performed       Past Medical History:  Diagnosis Date  . Anemia   . Blood in stool, frank 12/01/2011  . Depressive reaction   . Diverticulitis   . Diverticulosis   . Frequent UTI LAST 7/13  . 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 Mood disorder ([place])   . MS (multiple sclerosis) ([place])   . Rhinitis   . Superficial phlebitis   . Varicose veins     Past Surgical History:  Procedure Laterality Date  . ABDOMINAL HYSTERECTOMY  2008  . BLADDER SURGERY  2008   Tack  . SHOULDER ARTHROSCOPY WITH ROTATOR CUFF REPAIR AND SUBACROMIAL DECOMPRESSION  06/01/2012   Procedure: SHOULDER ARTHROSCOPY WITH ROTATOR CUFF REPAIR AND SUBACROMIAL DECOMPRESSION;  Surgeon: Magnus Sinning, MD;  Location: [facility];  Service: Orthopedics;  Laterality: Left;  LEFT SHOULDER ARTHROSCOPY WITH SUBACROMIAL DECOMPRESION AND DEBRIDEMENT OF PARTIAL ROTATOR CUFF TEAR  . TONSILLECTOMY AND ADENOIDECTOMY      There were no vitals filed for this visit.  Subjective Assessment - 07/04/19 0724    Subjective  Been trying the exercises, but not sure about the ball.  Brought in my husband to make sure I'm doing them correctly (and because our ball doesn't seem  as sturdy as yours).    Patient is accompained by:  Family member   Husband, George-beginning of session   Pertinent History  MS (dx 13 years ago)    Patient Stated Goals  Pt's goals for therapy are to improve balance and strength in RLE.    Currently in Pain?  Yes    Pain Score  3     Pain Location  Back    Pain Orientation  Right;Mid;Lower    Pain Descriptors / Indicators  Aching;Burning   into R hip   Pain Onset  1 to 4 weeks ago    Pain Frequency  Intermittent    Aggravating Factors   pain gets worse by midday    Pain Relieving Factors  Tylenol, Tyzanadine                       [facility] Adult PT Treatment/Exercise - 07/04/19 0001      Posture/Postural Control   Posture/Postural Control  Postural limitations    Postural Limitations  Rounded Shoulders;Forward head    Posture Comments  Seated forward lean to upright sit posture, x 8 reps; shoulder rolls x 10 reps, scapular squeezes x 10 reps.  Then standing at doorframe:  scapular squeezes x 5 reps; neck retraction at towel x 5 reps.  Pt reports tightness through R side of neck.  Self-Care   Self-Care  Other Self-Care Comments    Other Self-Care Comments   Instructed pt/husband in size of therapy ball being used (75 cm) as well as positioning-having ball in corner or up agains sturdy piece of furniture for stability.  Instructed patient in use of tennis balls along throracic paraspinals for self-massage agaist wall.  Instructed pt in use of theracane for self-massage to R upper traps.      Exercises   Exercises  Neck      Neck Exercises: Seated   Other Seated Exercise  Upper traps/levator stretch for R side-looking to L shoulder, with R hand behind back, 10-15 seconds x 3 reps.      Other Seated Exercise  Palpation/stretch along upper traps towards occipital condyle, with pt noted relief with manual stretch, x 2 minutes      Lumbar Exercises: Seated   Other Seated Lumbar Exercises  Seated lumbar  stabilization/abdominal activation:  with cues for neutral posture and abdominal activation:  Seated on blue therapy ball:  anterior/posterior pelvic tilts x 10, lateral pelvic tilts x 10 reps, then with abdominal activation (cues for beginning of each set):  alt UE lifts x 5, bilateral UE lifts x 5, UE lifts with trunk rotation x 5 reps.  All with min guard assist from therapist.  With pt having UE supported at mat, pt perfroms seated marching x 5 reps, seated LAQ x 5 reps, x 2 sets.  Seated on blue therapy ball, cues for abdominal activation, also performed rocking up and down seated on ball, sided to side rocking.     Reviewed pt's current HEP-verbalized/return demo understanding.       PT Education - 07/04/19 1506    Education Details  self-massage with tennis balls, levator/upper traps stretch    Person(s) Educated  Patient    Methods  Explanation;Demonstration;Verbal cues;Handout    Comprehension  Verbalized understanding;Returned demonstration       PT Short Term Goals - 06/17/19 0810      PT SHORT TERM GOAL #1   Title  Pt will be independent with HEP to improve balance, strength, gait.    Time  4    Period  Weeks    Status  Achieved    Target Date  06/10/19   May be delayed, if delayed start due to scheduling conflicts     PT SHORT TERM GOAL #2   Title  Pt will improve 5x sit<>stand transfer test to less than or equal to 18 seconds for improved lower extremity strength and transfer efficiency.    Time  4    Period  Weeks    Status  Partially Met    Target Date  06/10/19      PT SHORT TERM GOAL #3   Title  Pt will imporve Berg score to at least 38/56 for decreased fall risk.    Time  4    Period  Weeks    Status  Achieved    Target Date  06/10/19      PT SHORT TERM GOAL #4   Title  Pt will verbalize understanding of fall prevention in the home environment.    Time  4    Period  Weeks    Status  Achieved    Target Date  06/10/19      PT SHORT TERM GOAL #5    Title  Pt will report at least 25% improvement in functional activities such as bed mobility and car transfers.  Time  4    Period  Weeks    Status  On-going    Target Date  06/10/19        PT Long Term Goals - 07/04/19 1511      PT LONG TERM GOAL #1   Title  Pt will be independent with progression of HEP for improved balance, strength, gait.  TARGET Extended 07/15/2019    Time  2    Period  Weeks    Status  On-going      PT LONG TERM GOAL #2   Title  Pt will improve TUG score to less than or equal to 15 seconds for decreased fall risk.    Time  6    Period  Weeks    Status  On-going      PT LONG TERM GOAL #3   Title  Pt will improve gait velocity to at least 2.5 ft/sec for improved gait eficiency and safety.    Time  6    Period  Weeks    Status  On-going      PT LONG TERM GOAL #4   Title  Pt will verbalize understanding of local MS resources, including exercise options upon d/c from PT.    Time  6    Period  Weeks    Status  On-going            Plan - 07/04/19 1508    Clinical Impression Statement  Pt has missed several weeks of therapy in her current POC, due to shingles, COVIC exposure, and Thanksgiving holiday.  STGs were checked last visit (06/17/2019), and she is on target towards LTGs.  She will benefit from additional several weeks of PT to further address exercises to address flexibility, strength, posture, and gait/balance.    PT Frequency  2x / week    PT Duration  2 weeks   per recert, including 16/07/958 visit   PT Treatment/Interventions  ADLs/Self Care Home Management;Gait training;Neuromuscular re-education;Balance training;Therapeutic exercise;Therapeutic activities;Patient/family education;Functional mobility training;Electrical Stimulation;Orthotic Fit/Training    PT Next Visit Plan  [place] postural exercises and use of tennis ball for self-massage; continue postural and lower extremity strengthening, balance/gait towards LTGs.    Consulted and  Agree with Plan of Care  Patient       Patient will benefit from skilled therapeutic intervention in order to improve the following deficits and impairments:  Abnormal gait, Decreased balance, Decreased mobility, Decreased strength, Difficulty walking, Impaired tone  Visit Diagnosis: Unsteadiness on feet  Muscle weakness (generalized)  Other abnormalities of gait and mobility  Abnormal posture     Problem List Patient Active Problem List   Diagnosis Date Noted  . Rectal bleeding 12/05/2016  . Internal hemorrhoids 12/05/2016  . Hx of falling 03/17/2016  . Nausea alone 02/13/2014  . Other malaise and fatigue 02/13/2014  . Postmenopausal estrogen deficiency 02/24/2013  . Visit for preventive health examination 02/24/2013  . Family hx osteoporosis 02/21/2013  . Recurrent UTI 01/09/2012  . Suprapubic tenderness 12/01/2011  . GERD (gastroesophageal reflux disease) 10/15/2011  . Dysphagia, pharyngoesophageal 10/15/2011  . Voiding dysfunction 09/24/2011  . Hypertriglyceridemia 02/09/2011  . VITAMIN D DEFICIENCY 12/13/2008  . Constipation 12/13/2008  . MUSCLE SPASM 12/13/2008  . DIZZINESS 12/13/2008  . HYPOKALEMIA, HX OF 12/13/2008  . DISORDER, ADJUSTMENT W/DEPRESSED MOOD 04/30/2007  . SCLEROSIS, MULTIPLE 04/30/2007  . HYPERTENSION 04/30/2007  . VARICOSE VEIN, LWR EXTREMITIES W/INFLAMMATION 04/30/2007  . VASOMOTOR RHINITIS 04/30/2007    MARRIOTT,AMY W. 07/04/2019, 3:13 PM MARRIOTT,AMY  Viona Gilmore PT  [facility] [address] [place], [name], [address] Phone: [phone]   Fax:  [phone]  Name: [name] MRN: [id number] Date of Birth: 1955/09/06

## 2019-07-04 NOTE — Patient Instructions (Addendum)
Levator Scapula Stretch, Sitting    Sit, one hand tucked under hip (or behind back) on side to be stretched, other hand over top of head (or relaxed by your side). Turn head toward other side and look down. Use hand on head to gently stretch neck in that position if needed. Hold __10-15_ seconds. Repeat _3__ times per session. Do __1-2_ sessions per day.  Copyright  VHI. All rights reserved.

## 2019-07-06 ENCOUNTER — Other Ambulatory Visit: Payer: Self-pay

## 2019-07-06 ENCOUNTER — Encounter: Payer: Self-pay | Admitting: Physical Therapy

## 2019-07-06 ENCOUNTER — Ambulatory Visit: Payer: Medicare Other | Admitting: Physical Therapy

## 2019-07-06 DIAGNOSIS — R293 Abnormal posture: Secondary | ICD-10-CM | POA: Diagnosis not present

## 2019-07-06 DIAGNOSIS — M6281 Muscle weakness (generalized): Secondary | ICD-10-CM

## 2019-07-06 DIAGNOSIS — R2681 Unsteadiness on feet: Secondary | ICD-10-CM | POA: Diagnosis not present

## 2019-07-06 DIAGNOSIS — R2689 Other abnormalities of gait and mobility: Secondary | ICD-10-CM | POA: Diagnosis not present

## 2019-07-06 NOTE — Patient Instructions (Addendum)
Access Code: [email or web address]  URL: [email or web address]  Date: 07/06/2019  Prepared by: Mady Haagensen   Exercises Supine Quadriceps Stretch with Strap on Table - 3 reps - 1 sets - 15-30 sec hold - 1x daily - 5x weekly Hip Flexor Stretch on Step - 10 reps - 1 sets - 5-10 sec hold - 1x daily - 5x weekly Alternating Step Taps with Counter Support - 10 reps - 1 sets - 1x daily - 5x weekly Sideways Step Touch - 10 reps - 1 sets - 1x daily - 5x weekly  Added 07/06/2019: Hooklying Single Knee to Chest - 3 reps - 1 sets - 30 sec hold - 1x daily - 3-5x weekly Supine Lower Trunk Rotation - 5 reps - 1 sets - 10-15 sec hold - 1x daily - 5x weekly Seated Scapular Retraction - 10 reps - 1 sets - 3 sec hold - 1x daily - 3-5x weekly

## 2019-07-07 NOTE — Therapy (Signed)
[facility] [address] [place], [name], [address] Phone: [phone]   Fax:  [phone]  Physical Therapy Treatment  Patient Details  Name: [name] MRN: [id number] Date of Birth: 1955-10-18 Referring Provider (PT): Karl Luke   Encounter Date: 07/06/2019  PT End of Montgomery - 07/07/19 1419    Visit Number  9    Number of Visits  13    Date for PT Re-Evaluation  99/24/26   recert to cover current [facility] need 10th visit progress note    PT Start Time  1700    PT Stop Time  1739    PT Time Calculation (min)  39 min    Activity Tolerance  Patient tolerated treatment well;Patient limited by fatigue;Patient limited by pain    Behavior During Therapy  [facility] for tasks assessed/performed       Past Medical History:  Diagnosis Date  . Anemia   . Blood in stool, frank 12/01/2011  . Depressive reaction   . Diverticulitis   . Diverticulosis   . Frequent UTI LAST 7/13  . 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 Mood disorder ([place])   . MS (multiple sclerosis) ([place])   . Rhinitis   . Superficial phlebitis   . Varicose veins     Past Surgical History:  Procedure Laterality Date  . ABDOMINAL HYSTERECTOMY  2008  . BLADDER SURGERY  2008   Tack  . SHOULDER ARTHROSCOPY WITH ROTATOR CUFF REPAIR AND SUBACROMIAL DECOMPRESSION  06/01/2012   Procedure: SHOULDER ARTHROSCOPY WITH ROTATOR CUFF REPAIR AND SUBACROMIAL DECOMPRESSION;  Surgeon: Magnus Sinning, MD;  Location: [facility];  Service: Orthopedics;  Laterality: Left;  LEFT SHOULDER ARTHROSCOPY WITH SUBACROMIAL DECOMPRESION AND DEBRIDEMENT OF PARTIAL ROTATOR CUFF TEAR  . TONSILLECTOMY AND ADENOIDECTOMY      There were no vitals filed for this visit.  Subjective Assessment - 07/06/19 1703    Subjective  Today have pain on both sides of my back.  Had to bring in my cane today due to taking [employer].    Patient is accompained by:  Family member   Husband, [name]   Pertinent History  MS (dx 13 years ago)    Patient Stated Goals  Pt's goals for therapy are to improve balance and strength in RLE.    Currently in Pain?  Yes    Pain Score  4     Pain Location  Back    Pain Orientation  Right;Left;Mid;Lower    Pain Descriptors / Indicators  Aching;Burning    Pain Onset  1 to 4 weeks ago    Pain Frequency  Intermittent    Aggravating Factors   worse pain later in the day    Pain Relieving Factors  medications                       [facility] Adult PT Treatment/Exercise - 07/07/19 1425      Exercises   Exercises  Other Exercises    Other Exercises   Discussed additions to HEP, groupings of exercises to think about performing varied exercises (balance, posture, core/leg strengthening) at different times of the day or different days.  Discussed avoiding overuse of muscles with too many exercises, in regards to fatigue and her MS.  Disucssed continued fitness options, including [employer]      Neck Exercises: Seated   Other Seated Exercise  Reviewed upper  traps/levator stretch for R side-looking to L shoulder, with R hand behind back, 10-15 seconds x 3 reps.  Pt return demo understanding-reports it is not too much of a stretch like it was last visit.      Lumbar Exercises: Stretches   Single Knee to Chest Stretch  Right;Left;3 reps;30 seconds    Single Knee to Chest Stretch Limitations  using towel to pull leg towards body    Lower Trunk Rotation  5 reps;10 seconds   Rocking side to side to initiate   Other Lumbar Stretch Exercise  shoulder blade press into the mat x 5 reps; chin tuck/head press into the pillow x 5 reps    Other Lumbar Stretch Exercise  Passive occipital stretch 10 reps with pt in supine, hooklying position, 5-10 second hold      Lumbar Exercises: Seated   Other Seated Lumbar Exercises  Seated lumbar pelvic tilts x 10 reps; scapular retraction x  10 reps;       Knee/Hip Exercises: Aerobic   Stepper  Seated SciFit Stepper, Level 1, 4 extremities x 6 minutes for improved lower extremity flexibility and strength.        Pt has increased pain in mid-low back (calls it her "MS Hug") with supine<>sit transfers.  Pt has to perform slowly and take multiple deep breaths to lessen pain.      PT Education - 07/07/19 1229    Education Details  Addition of HEP; to split up her exercises to avoid doing too many at one time or on one day    Person(s) Educated  Patient    Methods  Explanation;Demonstration;Verbal cues;Handout    Comprehension  Verbalized understanding;Returned demonstration       PT Short Term Goals - 06/17/19 0810      PT SHORT TERM GOAL #1   Title  Pt will be independent with HEP to improve balance, strength, gait.    Time  4    Period  Weeks    Status  Achieved    Target Date  06/10/19   May be delayed, if delayed start due to scheduling conflicts     PT SHORT TERM GOAL #2   Title  Pt will improve 5x sit<>stand transfer test to less than or equal to 18 seconds for improved lower extremity strength and transfer efficiency.    Time  4    Period  Weeks    Status  Partially Met    Target Date  06/10/19      PT SHORT TERM GOAL #3   Title  Pt will imporve Berg score to at least 38/56 for decreased fall risk.    Time  4    Period  Weeks    Status  Achieved    Target Date  06/10/19      PT SHORT TERM GOAL #4   Title  Pt will verbalize understanding of fall prevention in the home environment.    Time  4    Period  Weeks    Status  Achieved    Target Date  06/10/19      PT SHORT TERM GOAL #5   Title  Pt will report at least 25% improvement in functional activities such as bed mobility and car transfers.    Time  4    Period  Weeks    Status  On-going    Target Date  06/10/19        PT Long Term Goals - 07/04/19 1511  PT LONG TERM GOAL #1   Title  Pt will be independent with progression of HEP for  improved balance, strength, gait.  TARGET Extended 07/15/2019    Time  2    Period  Weeks    Status  On-going      PT LONG TERM GOAL #2   Title  Pt will improve TUG score to less than or equal to 15 seconds for decreased fall risk.    Time  6    Period  Weeks    Status  On-going      PT LONG TERM GOAL #3   Title  Pt will improve gait velocity to at least 2.5 ft/sec for improved gait eficiency and safety.    Time  6    Period  Weeks    Status  On-going      PT LONG TERM GOAL #4   Title  Pt will verbalize understanding of local MS resources, including exercise options upon d/c from PT.    Time  6    Period  Weeks    Status  On-going            Plan - 07/07/19 1419    Clinical Impression Statement  Reviewed most recent stretch for levator scap/upper trap stretch; added more posture and low back stretches.  Pt has more difficulty with exercises and overall mobility yesterday, as pt reports generally more fatigued and more pain later in the day.    PT Frequency  2x / week    PT Duration  2 weeks   per recert, including 61/08/2447 visit   PT Treatment/Interventions  ADLs/Self Care Home Management;Gait training;Neuromuscular re-education;Balance training;Therapeutic exercise;Therapeutic activities;Patient/family education;Functional mobility training;Electrical Stimulation;Orthotic Fit/Training    PT Next Visit Plan  Review additions to posture HEP; begin to assess LTGs and plan for d/c next week.    Consulted and Agree with Plan of Care  Patient       Patient will benefit from skilled therapeutic intervention in order to improve the following deficits and impairments:  Abnormal gait, Decreased balance, Decreased mobility, Decreased strength, Difficulty walking, Impaired tone  Visit Diagnosis: Muscle weakness (generalized)  Abnormal posture     Problem List Patient Active Problem List   Diagnosis Date Noted  . Rectal bleeding 12/05/2016  . Internal hemorrhoids  12/05/2016  . Hx of falling 03/17/2016  . Nausea alone 02/13/2014  . Other malaise and fatigue 02/13/2014  . Postmenopausal estrogen deficiency 02/24/2013  . Visit for preventive health examination 02/24/2013  . Family hx osteoporosis 02/21/2013  . Recurrent UTI 01/09/2012  . Suprapubic tenderness 12/01/2011  . GERD (gastroesophageal reflux disease) 10/15/2011  . Dysphagia, pharyngoesophageal 10/15/2011  . Voiding dysfunction 09/24/2011  . Hypertriglyceridemia 02/09/2011  . VITAMIN D DEFICIENCY 12/13/2008  . Constipation 12/13/2008  . MUSCLE SPASM 12/13/2008  . DIZZINESS 12/13/2008  . HYPOKALEMIA, HX OF 12/13/2008  . DISORDER, ADJUSTMENT W/DEPRESSED MOOD 04/30/2007  . SCLEROSIS, MULTIPLE 04/30/2007  . HYPERTENSION 04/30/2007  . VARICOSE VEIN, LWR EXTREMITIES W/INFLAMMATION 04/30/2007  . VASOMOTOR RHINITIS 04/30/2007    Margaret Butt. 07/07/2019, 2:27 PM  Margaret Butt., PT  [facility] [address] [place], [name], [address] Phone: [phone]   Fax:  [phone]  Name: [name] MRN: [id number] Date of Birth: 1956-01-05

## 2019-07-11 ENCOUNTER — Other Ambulatory Visit: Payer: Self-pay

## 2019-07-11 ENCOUNTER — Ambulatory Visit: Payer: Medicare Other | Admitting: Physical Therapy

## 2019-07-11 DIAGNOSIS — M6281 Muscle weakness (generalized): Secondary | ICD-10-CM

## 2019-07-11 DIAGNOSIS — R293 Abnormal posture: Secondary | ICD-10-CM

## 2019-07-11 NOTE — Patient Instructions (Addendum)
Scapula Adduction With Pectoralis Stretch: Low - Standing    You can do this in a corner or at the doorframe.  Shoulders positioned so that you place your arms at the wall, keeping weight on feet, lean forward you should feel a gentle stretch on the front of your shoulders.  Do _3__ times, __1-2_ times per day. Hold 30 seconds each. http://ss.exer.us/239   Copyright  VHI. All rights reserved.

## 2019-07-11 NOTE — Therapy (Signed)
[facility] [address] [place], [name], [address] Phone: [phone]   Fax:  [phone]  Physical Therapy Treatment/10th Visit Progress Note  Patient Details  Name: [name] MRN: [id number] Date of Birth: 1955-10-28 Referring Provider (PT): Karl Luke   Encounter Date: 07/11/2019  PT End of Session - 07/11/19 1234    Visit Number  10    Number of Visits  13    Date for PT Re-Evaluation  30/07/62   recert to cover current [facility] need 10th visit progress note    PT Start Time  0724    PT Stop Time  0800    PT Time Calculation (min)  36 min    Activity Tolerance  Patient tolerated treatment well;Patient limited by fatigue;Patient limited by pain    Behavior During Therapy  [facility] for tasks assessed/performed       Past Medical History:  Diagnosis Date  . Anemia   . Blood in stool, frank 12/01/2011  . Depressive reaction   . Diverticulitis   . Diverticulosis   . Frequent UTI LAST 7/13  . 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 Mood disorder ([place])   . MS (multiple sclerosis) ([place])   . Rhinitis   . Superficial phlebitis   . Varicose veins     Past Surgical History:  Procedure Laterality Date  . ABDOMINAL HYSTERECTOMY  2008  . BLADDER SURGERY  2008   Tack  . SHOULDER ARTHROSCOPY WITH ROTATOR CUFF REPAIR AND SUBACROMIAL DECOMPRESSION  06/01/2012   Procedure: SHOULDER ARTHROSCOPY WITH ROTATOR CUFF REPAIR AND SUBACROMIAL DECOMPRESSION;  Surgeon: Magnus Sinning, MD;  Location: [facility];  Service: Orthopedics;  Laterality: Left;  LEFT SHOULDER ARTHROSCOPY WITH SUBACROMIAL DECOMPRESION AND DEBRIDEMENT OF PARTIAL ROTATOR CUFF TEAR  . TONSILLECTOMY AND ADENOIDECTOMY      There were no vitals filed for this visit.  Subjective Assessment - 07/11/19 0724    Subjective  Feel like the exercises for my neck have really helped the most.  I know  it's not as tight as it was.  Now I'm paying more attention to my posture.    Patient is accompained by:  Family member   Husband, George-beginning of session   Pertinent History  MS (dx 13 years ago)    Patient Stated Goals  Pt's goals for therapy are to improve balance and strength in RLE.    Currently in Pain?  Yes    Pain Score  3     Pain Location  Back    Pain Orientation  Right;Left;Mid;Upper    Pain Descriptors / Indicators  Aching;Burning   Stiff   Pain Type  Acute pain    Pain Onset  1 to 4 weeks ago    Pain Frequency  Intermittent    Aggravating Factors   worse pain later in the day    Pain Relieving Factors  medications                       [facility] Adult PT Treatment/Exercise - 07/11/19 0001      Ambulation/Gait   Ambulation/Gait  Yes    Ambulation/Gait Assistance  5: Supervision    Ambulation Distance (Feet)  230 Feet    Assistive device  Rollator    Gait Pattern  Step-through pattern;Decreased step length - right;Decreased hip/knee flexion - right;Decreased dorsiflexion - right   wearing R foot-up brace  Ambulation Surface  Level;Indoor    Pre-Gait Activities  Pt reports having overall improved confidence with rollator and use of foot-up brace.      Posture/Postural Control   Posture/Postural Control  Postural limitations    Postural Limitations  Rounded Shoulders;Forward head    Posture Comments  Corner pec stretch, 3 reps x 15 seconds-cues for technique, optimal stretch.  Standing at doorframe:  scapular retraction x 5 reps, neck retraction against towel x 5 reps.      Lumbar Exercises: Stretches   Single Knee to Chest Stretch  Right;Left;3 reps;30 seconds    Single Knee to Chest Stretch Limitations  using towel to pull leg towards body    Lower Trunk Rotation  5 reps;10 seconds    Other Lumbar Stretch Exercise  shoulder blade press into the mat x 5 reps    Other Lumbar Stretch Exercise  Passive occipital stretch 10 reps with pt in supine,  hooklying position, 5-10 second hold.  Passive cervical rotation stretch 3 reps each side, 10 seconds hold.      Lumbar Exercises: Seated   Other Seated Lumbar Exercises  scapular retraction x 10 reps;       Lumbar Exercises: Supine   Bridge  Non-compliant;5 reps;3 seconds   2 sets-cues for abdominal activation   Other Supine Lumbar Exercises  Abdominal activation with hooklying marching, 2 sets x 5 reps; hooklying abduction with abdominal activation x 5 reps, 2 sets             PT Education - 07/11/19 1233    Education Details  Pec stretch added to HEP    Person(s) Educated  Patient    Methods  Explanation;Demonstration;Handout;Verbal cues    Comprehension  Verbalized understanding;Returned demonstration       PT Short Term Goals - 06/17/19 0810      PT SHORT TERM GOAL #1   Title  Pt will be independent with HEP to improve balance, strength, gait.    Time  4    Period  Weeks    Status  Achieved    Target Date  06/10/19   May be delayed, if delayed start due to scheduling conflicts     PT SHORT TERM GOAL #2   Title  Pt will improve 5x sit<>stand transfer test to less than or equal to 18 seconds for improved lower extremity strength and transfer efficiency.    Time  4    Period  Weeks    Status  Partially Met    Target Date  06/10/19      PT SHORT TERM GOAL #3   Title  Pt will imporve Berg score to at least 38/56 for decreased fall risk.    Time  4    Period  Weeks    Status  Achieved    Target Date  06/10/19      PT SHORT TERM GOAL #4   Title  Pt will verbalize understanding of fall prevention in the home environment.    Time  4    Period  Weeks    Status  Achieved    Target Date  06/10/19      PT SHORT TERM GOAL #5   Title  Pt will report at least 25% improvement in functional activities such as bed mobility and car transfers.    Time  4    Period  Weeks    Status  On-going    Target Date  06/10/19  PT Long Term Goals - 07/04/19 1511      PT  LONG TERM GOAL #1   Title  Pt will be independent with progression of HEP for improved balance, strength, gait.  TARGET Extended 07/15/2019    Time  2    Period  Weeks    Status  On-going      PT LONG TERM GOAL #2   Title  Pt will improve TUG score to less than or equal to 15 seconds for decreased fall risk.    Time  6    Period  Weeks    Status  On-going      PT LONG TERM GOAL #3   Title  Pt will improve gait velocity to at least 2.5 ft/sec for improved gait eficiency and safety.    Time  6    Period  Weeks    Status  On-going      PT LONG TERM GOAL #4   Title  Pt will verbalize understanding of local MS resources, including exercise options upon d/c from PT.    Time  6    Period  Weeks    Status  On-going            Plan - 07/11/19 1239    Clinical Impression Statement  10th VISIT PROGRESS NOTE, covering dates 05/12/2019-07/11/2019.  Pt missed several visits initially due to shingles-related issues, but since then, pt has been consistent with therapy sessions and exercises and HEP at home.  Previously assessed 5x sit<>stand 18.65 sec (improved from 21.16 sec), Berg score imporved from 34/56 to 39/56.  Recent focus on therapy sessions has been on postural stregnthening and stretching in addition to leg and core strengthening.  Pt is preparing for d/c next visit.    PT Frequency  2x / week    PT Duration  2 weeks   per recert, including 57/08/6201 visit   PT Treatment/Interventions  ADLs/Self Care Home Management;Gait training;Neuromuscular re-education;Balance training;Therapeutic exercise;Therapeutic activities;Patient/family education;Functional mobility training;Electrical Stimulation;Orthotic Fit/Training    PT Next Visit Plan  Plan for d/c next visit.    Consulted and Agree with Plan of Care  Patient       Patient will benefit from skilled therapeutic intervention in order to improve the following deficits and impairments:  Abnormal gait, Decreased balance, Decreased  mobility, Decreased strength, Difficulty walking, Impaired tone  Visit Diagnosis: Abnormal posture  Muscle weakness (generalized)     Problem List Patient Active Problem List   Diagnosis Date Noted  . Rectal bleeding 12/05/2016  . Internal hemorrhoids 12/05/2016  . Hx of falling 03/17/2016  . Nausea alone 02/13/2014  . Other malaise and fatigue 02/13/2014  . Postmenopausal estrogen deficiency 02/24/2013  . Visit for preventive health examination 02/24/2013  . Family hx osteoporosis 02/21/2013  . Recurrent UTI 01/09/2012  . Suprapubic tenderness 12/01/2011  . GERD (gastroesophageal reflux disease) 10/15/2011  . Dysphagia, pharyngoesophageal 10/15/2011  . Voiding dysfunction 09/24/2011  . Hypertriglyceridemia 02/09/2011  . VITAMIN D DEFICIENCY 12/13/2008  . Constipation 12/13/2008  . MUSCLE SPASM 12/13/2008  . DIZZINESS 12/13/2008  . HYPOKALEMIA, HX OF 12/13/2008  . DISORDER, ADJUSTMENT W/DEPRESSED MOOD 04/30/2007  . SCLEROSIS, MULTIPLE 04/30/2007  . HYPERTENSION 04/30/2007  . VARICOSE VEIN, LWR EXTREMITIES W/INFLAMMATION 04/30/2007  . VASOMOTOR RHINITIS 04/30/2007    Gilverto Dileonardo W. 07/11/2019, 1:48 PM  Frazier Butt., PT   [facility] [address] [place], [name], [address] Phone: [phone]   Fax:  [phone]  Name:  [name] MRN: [id number] Date of Birth: 09/02/1955

## 2019-07-13 ENCOUNTER — Other Ambulatory Visit: Payer: Self-pay

## 2019-07-13 ENCOUNTER — Ambulatory Visit: Payer: Medicare Other | Admitting: Physical Therapy

## 2019-07-13 DIAGNOSIS — R2681 Unsteadiness on feet: Secondary | ICD-10-CM | POA: Diagnosis not present

## 2019-07-13 DIAGNOSIS — R2689 Other abnormalities of gait and mobility: Secondary | ICD-10-CM | POA: Diagnosis not present

## 2019-07-13 DIAGNOSIS — M6281 Muscle weakness (generalized): Secondary | ICD-10-CM

## 2019-07-13 DIAGNOSIS — R293 Abnormal posture: Secondary | ICD-10-CM

## 2019-07-14 NOTE — Therapy (Signed)
[facility] [address] [place], [name], [address] Phone: [phone]   Fax:  [phone]  Physical Therapy Treatment  Patient Details  Name: [name] MRN: [id number] Date of Birth: 11/22/1955 Referring Provider (PT): Karl Luke   Encounter Date: 07/13/2019  PT End of Session - 07/14/19 1000    Visit Number  11    Number of Visits  13    Date for PT Re-Evaluation  82/64/15   recert to cover current [place] need 10th visit progress note    PT Start Time  1702    PT Stop Time  1741    PT Time Calculation (min)  39 min    Activity Tolerance  Patient tolerated treatment well;Patient limited by fatigue;Patient limited by pain    Behavior During Therapy  [facility] for tasks assessed/performed       Past Medical History:  Diagnosis Date  . Anemia   . Blood in stool, frank 12/01/2011  . Depressive reaction   . Diverticulitis   . Diverticulosis   . Frequent UTI LAST 7/13  . 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 Mood disorder ([place])   . MS (multiple sclerosis) ([place])   . Rhinitis   . Superficial phlebitis   . Varicose veins     Past Surgical History:  Procedure Laterality Date  . ABDOMINAL HYSTERECTOMY  2008  . BLADDER SURGERY  2008   Tack  . SHOULDER ARTHROSCOPY WITH ROTATOR CUFF REPAIR AND SUBACROMIAL DECOMPRESSION  06/01/2012   Procedure: SHOULDER ARTHROSCOPY WITH ROTATOR CUFF REPAIR AND SUBACROMIAL DECOMPRESSION;  Surgeon: Magnus Sinning, MD;  Location: [facility];  Service: Orthopedics;  Laterality: Left;  LEFT SHOULDER ARTHROSCOPY WITH SUBACROMIAL DECOMPRESION AND DEBRIDEMENT OF PARTIAL ROTATOR CUFF TEAR  . TONSILLECTOMY AND ADENOIDECTOMY      There were no vitals filed for this visit.  Subjective Assessment - 07/13/19 1707    Subjective  Just want to go over the exercises to make sure I'm doing them correctly.    Patient is  accompained by:  Family member   Husband, George-beginning of session   Pertinent History  MS (dx 13 years ago)    Patient Stated Goals  Pt's goals for therapy are to improve balance and strength in RLE.    Currently in Pain?  Yes    Pain Score  4     Pain Location  Back    Pain Orientation  Right;Left;Mid    Pain Descriptors / Indicators  Aching;Burning    Pain Onset  1 to 4 weeks ago    Pain Frequency  Intermittent    Aggravating Factors   worse later in the day    Pain Relieving Factors  medications                       [facility] Adult PT Treatment/Exercise - 07/14/19 0955      Transfers   Transfers  Sit to Stand;Stand to Sit    Sit to Stand  6: Modified independent (Device/Increase time);With upper extremity assist;From chair/3-in-1;From bed    Stand to Sit  6: Modified independent (Device/Increase time);With upper extremity assist;To chair/3-in-1;To bed      Ambulation/Gait   Ambulation/Gait  Yes    Ambulation/Gait Assistance  5: Supervision    Ambulation/Gait Assistance Details  Pt not wearing foot up brace today    Ambulation Distance (Feet)  230 Feet  Assistive device  Rollator    Gait Pattern  Step-through pattern;Decreased step length - right;Decreased hip/knee flexion - right;Decreased dorsiflexion - right    Ambulation Surface  Level;Indoor    Gait velocity  11.69 sec = 2.81 ft/sec      Standardized Balance Assessment   Standardized Balance Assessment  Timed Up and Go Test      Timed Up and Go Test   TUG  Normal TUG    Normal TUG (seconds)  14.75      Self-Care   Self-Care  Other Self-Care Comments    Other Self-Care Comments   Discussed pt's progress towards goals, plans for d/c this visit.  Discussed that she remains at fall risk per TUG and (previous) Berg scores and use of rollator is still recommended for safety with gait.  Discussed pt's plans for [employer], 2x/wk with husband to use machines, in addition to HEP.        Reviewed pt's full  HEP: (pt brings in pictures and her written note card, where she has organized her exercises into seated, supine, standing)  Pt able to perform HEP independently with minimal cues, when questions arise for clarification.        PT Short Term Goals - 06/17/19 0810      PT SHORT TERM GOAL #1   Title  Pt will be independent with HEP to improve balance, strength, gait.    Time  4    Period  Weeks    Status  Achieved    Target Date  06/10/19   May be delayed, if delayed start due to scheduling conflicts     PT SHORT TERM GOAL #2   Title  Pt will improve 5x sit<>stand transfer test to less than or equal to 18 seconds for improved lower extremity strength and transfer efficiency.    Time  4    Period  Weeks    Status  Partially Met    Target Date  06/10/19      PT SHORT TERM GOAL #3   Title  Pt will imporve Berg score to at least 38/56 for decreased fall risk.    Time  4    Period  Weeks    Status  Achieved    Target Date  06/10/19      PT SHORT TERM GOAL #4   Title  Pt will verbalize understanding of fall prevention in the home environment.    Time  4    Period  Weeks    Status  Achieved    Target Date  06/10/19      PT SHORT TERM GOAL #5   Title  Pt will report at least 25% improvement in functional activities such as bed mobility and car transfers.    Time  4    Period  Weeks    Status  On-going    Target Date  06/10/19        PT Long Term Goals - 07/13/19 1735      PT LONG TERM GOAL #1   Title  Pt will be independent with progression of HEP for improved balance, strength, gait.  TARGET Extended 07/15/2019    Time  2    Period  Weeks    Status  Achieved      PT LONG TERM GOAL #2   Title  Pt will improve TUG score to less than or equal to 15 seconds for decreased fall risk.    Time  6  Period  Weeks    Status  Achieved      PT LONG TERM GOAL #3   Title  Pt will improve gait velocity to at least 2.5 ft/sec for improved gait eficiency and safety.    Time   6    Period  Weeks    Status  Achieved      PT LONG TERM GOAL #4   Title  Pt will verbalize understanding of local MS resources, including exercise options upon d/c from PT.    Baseline  Has her HEP set up into positions; is going to try [employer] 2x/wk with husband    Time  6    Period  Weeks    Status  Achieved            Plan - 07/14/19 1001    Clinical Impression Statement  LTGs assessed this visit, with pt meeting 4 of 4 LTGs.  Pt has continually demonstrated commitment to performing HEP and plans to go to [employer] 2x/wk with her husband.  She continues to have mid back pain, which is not resolved with postural stretching and strengthening exercises and she plans to discuss with MD.  Pt is appropriate for d/c at this time.    PT Frequency  2x / week    PT Duration  2 weeks   per recert, including 40/03/8118 visit   PT Treatment/Interventions  ADLs/Self Care Home Management;Gait training;Neuromuscular re-education;Balance training;Therapeutic exercise;Therapeutic activities;Patient/family education;Functional mobility training;Electrical Stimulation;Orthotic Fit/Training    PT Next Visit Plan  Discharge this visit.    Consulted and Agree with Plan of Care  Patient       Patient will benefit from skilled therapeutic intervention in order to improve the following deficits and impairments:  Abnormal gait, Decreased balance, Decreased mobility, Decreased strength, Difficulty walking, Impaired tone  Visit Diagnosis: Muscle weakness (generalized)  Abnormal posture  Unsteadiness on feet  Other abnormalities of gait and mobility     Problem List Patient Active Problem List   Diagnosis Date Noted  . Rectal bleeding 12/05/2016  . Internal hemorrhoids 12/05/2016  . Hx of falling 03/17/2016  . Nausea alone 02/13/2014  . Other malaise and fatigue 02/13/2014  . Postmenopausal estrogen deficiency 02/24/2013  . Visit for preventive health examination 02/24/2013  .  Family hx osteoporosis 02/21/2013  . Recurrent UTI 01/09/2012  . Suprapubic tenderness 12/01/2011  . GERD (gastroesophageal reflux disease) 10/15/2011  . Dysphagia, pharyngoesophageal 10/15/2011  . Voiding dysfunction 09/24/2011  . Hypertriglyceridemia 02/09/2011  . VITAMIN D DEFICIENCY 12/13/2008  . Constipation 12/13/2008  . MUSCLE SPASM 12/13/2008  . DIZZINESS 12/13/2008  . HYPOKALEMIA, HX OF 12/13/2008  . DISORDER, ADJUSTMENT W/DEPRESSED MOOD 04/30/2007  . SCLEROSIS, MULTIPLE 04/30/2007  . HYPERTENSION 04/30/2007  . VARICOSE VEIN, LWR EXTREMITIES W/INFLAMMATION 04/30/2007  . VASOMOTOR RHINITIS 04/30/2007    Nevia Henkin W. 07/14/2019, 10:03 AM  Frazier Butt., PT  [facility] [address] [place], [name], [address] Phone: [phone]   Fax:  [phone]  Name: [name] MRN: [id number] Date of Birth: 1955-08-16  PHYSICAL THERAPY DISCHARGE SUMMARY  Visits from Start of Care: 11  Current functional level related to goals / functional outcomes: PT Long Term Goals - 07/13/19 1735      PT LONG TERM GOAL #1   Title  Pt will be independent with progression of HEP for improved balance, strength, gait.  TARGET Extended 07/15/2019    Time  2    Period  Weeks    Status  Achieved      PT LONG TERM GOAL #2   Title  Pt will improve TUG score to less than or equal to 15 seconds for decreased fall risk.    Time  6    Period  Weeks    Status  Achieved      PT LONG TERM GOAL #3   Title  Pt will improve gait velocity to at least 2.5 ft/sec for improved gait eficiency and safety.    Time  6    Period  Weeks    Status  Achieved      PT LONG TERM GOAL #4   Title  Pt will verbalize understanding of local MS resources, including exercise options upon d/c from PT.    Baseline  Has her HEP set up into positions; is going to try [employer] 2x/wk with husband    Time  6    Period  Weeks    Status  Achieved       Pt has met 4 of 4 LTGs.   Remaining deficits: Fatigue, decreased weakness, balance, back pain   Education / Equipment: Educated in [employer], plans continued fitness upon d/c from PT.  Plan: Patient agrees to discharge.  Patient goals were met. Patient is being discharged due to meeting the stated rehab goals.  ?????         Mady Haagensen, PT 07/14/19 10:07 AM Phone: [phone] Fax: [phone]

## 2019-07-27 DIAGNOSIS — Z79899 Other long term (current) drug therapy: Secondary | ICD-10-CM | POA: Diagnosis not present

## 2019-07-27 DIAGNOSIS — G35 Multiple sclerosis: Secondary | ICD-10-CM | POA: Diagnosis not present

## 2019-07-28 DIAGNOSIS — R339 Retention of urine, unspecified: Secondary | ICD-10-CM | POA: Diagnosis not present

## 2019-08-04 DIAGNOSIS — R195 Other fecal abnormalities: Secondary | ICD-10-CM | POA: Diagnosis not present

## 2019-08-05 DIAGNOSIS — L821 Other seborrheic keratosis: Secondary | ICD-10-CM | POA: Diagnosis not present

## 2019-08-05 DIAGNOSIS — L820 Inflamed seborrheic keratosis: Secondary | ICD-10-CM | POA: Diagnosis not present

## 2019-08-08 ENCOUNTER — Other Ambulatory Visit: Payer: Self-pay | Admitting: Internal Medicine

## 2019-08-23 DIAGNOSIS — K5900 Constipation, unspecified: Secondary | ICD-10-CM | POA: Diagnosis not present

## 2019-08-23 DIAGNOSIS — R194 Change in bowel habit: Secondary | ICD-10-CM | POA: Diagnosis not present

## 2019-08-29 DIAGNOSIS — R202 Paresthesia of skin: Secondary | ICD-10-CM | POA: Diagnosis not present

## 2019-08-29 DIAGNOSIS — Z79899 Other long term (current) drug therapy: Secondary | ICD-10-CM | POA: Diagnosis not present

## 2019-08-29 DIAGNOSIS — G35 Multiple sclerosis: Secondary | ICD-10-CM | POA: Diagnosis not present

## 2019-09-03 DIAGNOSIS — R339 Retention of urine, unspecified: Secondary | ICD-10-CM | POA: Diagnosis not present

## 2019-09-09 ENCOUNTER — Other Ambulatory Visit: Payer: Self-pay | Admitting: Internal Medicine

## 2019-09-26 DIAGNOSIS — M79601 Pain in right arm: Secondary | ICD-10-CM | POA: Diagnosis not present

## 2019-09-26 DIAGNOSIS — G35 Multiple sclerosis: Secondary | ICD-10-CM | POA: Diagnosis not present

## 2019-09-26 DIAGNOSIS — Z79899 Other long term (current) drug therapy: Secondary | ICD-10-CM | POA: Diagnosis not present

## 2019-10-03 DIAGNOSIS — H5201 Hypermetropia, right eye: Secondary | ICD-10-CM | POA: Diagnosis not present

## 2019-10-03 DIAGNOSIS — R195 Other fecal abnormalities: Secondary | ICD-10-CM | POA: Diagnosis not present

## 2019-10-03 DIAGNOSIS — H2513 Age-related nuclear cataract, bilateral: Secondary | ICD-10-CM | POA: Diagnosis not present

## 2019-10-03 DIAGNOSIS — H04123 Dry eye syndrome of bilateral lacrimal glands: Secondary | ICD-10-CM | POA: Diagnosis not present

## 2019-10-03 DIAGNOSIS — R194 Change in bowel habit: Secondary | ICD-10-CM | POA: Diagnosis not present

## 2019-10-03 DIAGNOSIS — H5212 Myopia, left eye: Secondary | ICD-10-CM | POA: Diagnosis not present

## 2019-10-19 DIAGNOSIS — R339 Retention of urine, unspecified: Secondary | ICD-10-CM | POA: Diagnosis not present

## 2019-10-31 DIAGNOSIS — Z79899 Other long term (current) drug therapy: Secondary | ICD-10-CM | POA: Diagnosis not present

## 2019-10-31 DIAGNOSIS — M79601 Pain in right arm: Secondary | ICD-10-CM | POA: Diagnosis not present

## 2019-10-31 DIAGNOSIS — M542 Cervicalgia: Secondary | ICD-10-CM | POA: Diagnosis not present

## 2019-10-31 DIAGNOSIS — G35 Multiple sclerosis: Secondary | ICD-10-CM | POA: Diagnosis not present

## 2019-11-28 DIAGNOSIS — Z79899 Other long term (current) drug therapy: Secondary | ICD-10-CM | POA: Diagnosis not present

## 2019-11-28 DIAGNOSIS — G35 Multiple sclerosis: Secondary | ICD-10-CM | POA: Diagnosis not present

## 2019-11-28 DIAGNOSIS — M79601 Pain in right arm: Secondary | ICD-10-CM | POA: Diagnosis not present

## 2019-11-30 DIAGNOSIS — R339 Retention of urine, unspecified: Secondary | ICD-10-CM | POA: Diagnosis not present

## 2020-01-04 DIAGNOSIS — G35 Multiple sclerosis: Secondary | ICD-10-CM | POA: Diagnosis not present

## 2020-01-17 DIAGNOSIS — R339 Retention of urine, unspecified: Secondary | ICD-10-CM | POA: Diagnosis not present

## 2020-02-06 DIAGNOSIS — Z79899 Other long term (current) drug therapy: Secondary | ICD-10-CM | POA: Diagnosis not present

## 2020-02-06 DIAGNOSIS — G35 Multiple sclerosis: Secondary | ICD-10-CM | POA: Diagnosis not present

## 2020-02-07 ENCOUNTER — Other Ambulatory Visit: Payer: Self-pay | Admitting: Internal Medicine

## 2020-02-22 DIAGNOSIS — R300 Dysuria: Secondary | ICD-10-CM | POA: Diagnosis not present

## 2020-02-23 DIAGNOSIS — R339 Retention of urine, unspecified: Secondary | ICD-10-CM | POA: Diagnosis not present

## 2020-02-29 DIAGNOSIS — N3020 Other chronic cystitis without hematuria: Secondary | ICD-10-CM | POA: Diagnosis not present

## 2020-03-04 ENCOUNTER — Other Ambulatory Visit: Payer: Self-pay | Admitting: Internal Medicine

## 2020-03-13 DIAGNOSIS — G35 Multiple sclerosis: Secondary | ICD-10-CM | POA: Diagnosis not present

## 2020-03-13 DIAGNOSIS — M79601 Pain in right arm: Secondary | ICD-10-CM | POA: Diagnosis not present

## 2020-03-13 DIAGNOSIS — Z79899 Other long term (current) drug therapy: Secondary | ICD-10-CM | POA: Diagnosis not present

## 2020-03-19 ENCOUNTER — Other Ambulatory Visit: Payer: Self-pay

## 2020-03-19 ENCOUNTER — Ambulatory Visit (INDEPENDENT_AMBULATORY_CARE_PROVIDER_SITE_OTHER): Payer: Medicare Other | Admitting: Internal Medicine

## 2020-03-19 ENCOUNTER — Encounter: Payer: Self-pay | Admitting: Internal Medicine

## 2020-03-19 VITALS — BP 128/72 | HR 78 | Temp 98.1°F | Ht 66.0 in | Wt 161.0 lb

## 2020-03-19 DIAGNOSIS — G35 Multiple sclerosis: Secondary | ICD-10-CM

## 2020-03-19 DIAGNOSIS — Z79899 Other long term (current) drug therapy: Secondary | ICD-10-CM

## 2020-03-19 DIAGNOSIS — I10 Essential (primary) hypertension: Secondary | ICD-10-CM

## 2020-03-19 MED ORDER — AMLODIPINE BESYLATE 2.5 MG PO TABS: 2.5000 mg | ORAL_TABLET | Freq: Every day | ORAL | 2 refills | Status: DC

## 2020-03-19 NOTE — Patient Instructions (Signed)
Refill meds  Today . Your blood pressure is in range   As well as lab test.   Get your shingles 2 and pneumovax when you can .  Have a nice trip.

## 2020-03-19 NOTE — Progress Notes (Signed)
Chief Complaint  Patient presents with  . Medication Refill    Doing well  . Medication Management  . Hypertension    HPI: [name] 64 y.o. come in for Chronic disease management  Last visit by me for ht was a year ago  Bp not checking on its own     Taking often qod to limit swelling  bpt seems ok when she gets infusions  gabapenitn dec toi 1/2  Less swelling   Taking   Med qod  Since then  Doing ok  Had covid vaccine  And to go [place].  In fall maybe .  2 cats  hh of 2 . Shingles  vaccine   Only had one   limimted supply . Waiting on #2 before other vaccine  Using trainer.  To get endurance up for her trip doing well  Gi  Bloating  Some   No acute sx  Had lab done at neuro on care everywhere  ROS: See pertinent positives and negatives per HPI. Mood stable   Past Medical History:  Diagnosis Date  . Anemia   . Blood in stool, frank 12/01/2011  . Depressive reaction   . Diverticulitis   . Diverticulosis   . Frequent UTI LAST 7/13  . 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 Mood disorder ([place])   . MS (multiple sclerosis) ([place])   . Rhinitis   . Superficial phlebitis   . Varicose veins     Family History  Problem Relation Age of Onset  . Lung cancer Father   . Colon cancer Neg Hx     Social History   Socioeconomic History  . Marital status: Married    Spouse name: Not on file  . Number of children: 4  . Years of education: Not on file  . Highest education level: Not on file  Occupational History  . Occupation: Disability  Tobacco Use  . Smoking status: Never Smoker  . Smokeless tobacco: Never Used  Vaping Use  . Vaping Use: Never used  Substance and Sexual Activity  . Alcohol use: Yes    Alcohol/week: 0.0 standard drinks    Comment: occ  . Drug use: No  . Sexual activity: Not on file  Other Topics Concern  . Not on file  Social History Narrative   Unable to volunteer    Now on disability   Children now out of home   Daily caffeine     Social Determinants of Health   Financial Resource Strain:   . Difficulty of Paying Living Expenses: Not on file  Food Insecurity:   . Worried About Charity fundraiser in the Last Year: Not on file  . Ran Out of Food in the Last Year: Not on file  Transportation Needs:   . Lack of Transportation (Medical): Not on file  . Lack of Transportation (Non-Medical): Not on file  Physical Activity:   . Days of Exercise per Week: Not on file  . Minutes of Exercise per Session: Not on file  Stress:   . Feeling of Stress : Not on file  Social Connections:   . Frequency of Communication with Friends and Family: Not on file  . Frequency of Social Gatherings with Friends and Family: Not on file  . Attends Religious Services: Not on file  . Active Member of Clubs or Organizations: Not on file  . Attends Archivist Meetings: Not on file  . Marital Status: Not on  file    Outpatient Medications Prior to Visit  Medication Sig Dispense Refill  . ascorbic acid (VITAMIN C) 1000 MG tablet Take by mouth.    . cephALEXin (KEFLEX) 500 MG capsule Take 500 mg by mouth daily.     . Cholecalciferol (VITAMIN D-3) 5000 UNITS TABS Take 1 tablet by mouth daily.    Marland Kitchen CRANBERRY PO Take 2 capsules by mouth daily.    . DULoxetine (CYMBALTA) 60 MG capsule Take 1 capsule by mouth 2 (two) times daily.    Marland Kitchen esomeprazole (NEXIUM) 20 MG capsule Take 20-40 mg by mouth daily as needed (acid reflux).    . gabapentin (NEURONTIN) 300 MG capsule Take 300 mg by mouth daily.     . meloxicam (MOBIC) 15 MG tablet Take 15 mg by mouth daily.    . Meth-Hyo-M Bl-Na Phos-Ph Sal (URIBEL) 118 MG CAPS Take 1 capsule by mouth daily as needed (bladder spasms). Using PRN    . natalizumab (TYSABRI) 300 MG/15ML injection Inject into the vein every 30 (thirty) days.     . Probiotic Product (PROBIOTIC PEARLS) CAPS Take 1 capsule by mouth daily.    . zoledronic acid (RECLAST) 5 MG/100ML SOLN injection Inject into the vein.    Marland Kitchen  amLODipine (NORVASC) 2.5 MG tablet Take 1 tablet (2.5 mg total) by mouth daily. Please schedule office visit with labs for refills. [id number] 30 tablet 0  . DULoxetine (CYMBALTA) 30 MG capsule Take 30 mg by mouth 2 (two) times daily.  (Patient not taking: Reported on 03/19/2020)    . DULoxetine (CYMBALTA) 60 MG capsule Take 60 mg by mouth 2 (two) times daily.  (Patient not taking: Reported on 03/19/2020)    . gabapentin (NEURONTIN) 300 MG capsule Take by mouth 1 day or 1 dose. (Patient not taking: Reported on 03/19/2020)    . tiZANidine (ZANAFLEX) 4 MG tablet Take 8 mg by mouth at bedtime. Take 3 by mouth (Patient not taking: Reported on 03/19/2020)    . valACYclovir (VALTREX) 1000 MG tablet Take 1 tablet (1,000 mg total) by mouth 3 (three) times daily. (Patient not taking: Reported on 03/19/2020) 21 tablet 0   No facility-administered medications prior to visit.     EXAM:  BP 128/72   Pulse 78   Temp 98.1 F (36.7 C) (Oral)   Ht 5\' 6"  (1.676 m)   Wt 161 lb (73 kg)   SpO2 94%   BMI 25.99 kg/m   Body mass index is 25.99 kg/m.  GENERAL: vitals reviewed and listed above, alert, oriented, appears well hydrated and in no acute distress HEENT: atraumatic, conjunctiva  clear, no obvious abnormalities on inspection of external nose and ears OP : masked NECK: no obvious masses on inspection palpation  LUNGS: clear to auscultation bilaterally, no wheezes, rales or rhonchi, good air movement CV: HRRR, no clubbing cyanosis or  peripheral edema nl cap refill  MS: moves all extremities ambulatory with cane  PSYCH: pleasant and cooperative, no obvious depression or anxiety Lab Results  Component Value Date   WBC 8.6 06/01/2018   HGB 13.2 06/01/2018   HCT 38.3 06/01/2018   PLT 271.0 06/01/2018   GLUCOSE 101 (H) 06/01/2018   CHOL 194 06/01/2018   TRIG 113.0 06/01/2018   HDL 64.10 06/01/2018   LDLDIRECT 110.0 04/23/2015   LDLCALC 107 (H) 06/01/2018   ALT 27 06/01/2018   AST 20 06/01/2018    NA 140 06/01/2018   K 4.4 06/01/2018   CL 102 06/01/2018   CREATININE  0.74 06/01/2018   BUN 17 06/01/2018   CO2 30 06/01/2018   TSH 1.01 04/27/2017   HGBA1C 5.2 02/13/2014   BP Readings from Last 3 Encounters:  03/19/20 128/72  05/23/19 (!) 146/90  03/09/19 130/80  reviewed  Care everywhere labs cmp  tsh  mammo  Ok   ASSESSMENT AND PLAN:  Discussed the following assessment and plan:  Essential hypertension  Medication management  High risk medication use  SCLEROSIS, MULTIPLE Has had covid vaccine   Will get    pna vaccine when convenient   Lab monitoring done via neurology  Refill med  Monitor readings   -Patient advised to return or notify health care team  if  new concerns arise.  Patient Instructions  Refill meds  Today . Your blood pressure is in range   As well as lab test.   Get your shingles 2 and pneumovax when you can .  Have a nice trip.    [name]. Bich Mchaney M.D.

## 2020-04-12 DIAGNOSIS — R339 Retention of urine, unspecified: Secondary | ICD-10-CM | POA: Diagnosis not present

## 2020-04-12 DIAGNOSIS — G35 Multiple sclerosis: Secondary | ICD-10-CM | POA: Diagnosis not present

## 2020-04-17 ENCOUNTER — Telehealth: Payer: Self-pay | Admitting: Internal Medicine

## 2020-04-17 NOTE — Progress Notes (Signed)
  Chronic Care Management   Outreach Note  04/17/2020 Name: [name] MRN: [id number] DOB: 1956-07-24  Referred by: Burnis Medin, MD Reason for referral : No chief complaint on file.   An unsuccessful telephone outreach was attempted today. The patient was referred to the pharmacist for assistance with care management and care coordination.   Follow Up Plan:   Carley Perdue [employer] Scheduler

## 2020-04-19 ENCOUNTER — Other Ambulatory Visit: Payer: Self-pay

## 2020-04-20 ENCOUNTER — Ambulatory Visit (INDEPENDENT_AMBULATORY_CARE_PROVIDER_SITE_OTHER): Payer: Medicare Other | Admitting: Internal Medicine

## 2020-04-20 ENCOUNTER — Other Ambulatory Visit: Payer: Self-pay

## 2020-04-20 ENCOUNTER — Encounter: Payer: Self-pay | Admitting: Internal Medicine

## 2020-04-20 VITALS — BP 128/82 | HR 87 | Temp 98.3°F | Ht 66.0 in | Wt 153.2 lb

## 2020-04-20 DIAGNOSIS — G35 Multiple sclerosis: Secondary | ICD-10-CM

## 2020-04-20 DIAGNOSIS — R229 Localized swelling, mass and lump, unspecified: Secondary | ICD-10-CM

## 2020-04-20 NOTE — Progress Notes (Signed)
Chief Complaint  Patient presents with  . Mass    Bump on Left hip bone    HPI: [name] 64 y.o. come in for new problem   Has lost 14 pounds with exercise and t raining  endurance going well but noted non tender lump on left side hip bone area .  No injury . Wonders if there and noted cause of weight loss. Looking forward to  Trip to [place] in November ROS: See pertinent positives and negatives per HPI.  Past Medical History:  Diagnosis Date  . Anemia   . Blood in stool, frank 12/01/2011  . Depressive reaction   . Diverticulitis   . Diverticulosis   . Frequent UTI LAST 7/13  . 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 Mood disorder ([place])   . MS (multiple sclerosis) ([place])   . Rhinitis   . Superficial phlebitis   . Varicose veins     Family History  Problem Relation Age of Onset  . Lung cancer Father   . Colon cancer Neg Hx     Social History   Socioeconomic History  . Marital status: Married    Spouse name: Not on file  . Number of children: 4  . Years of education: Not on file  . Highest education level: Not on file  Occupational History  . Occupation: Disability  Tobacco Use  . Smoking status: Never Smoker  . Smokeless tobacco: Never Used  Vaping Use  . Vaping Use: Never used  Substance and Sexual Activity  . Alcohol use: Yes    Alcohol/week: 0.0 standard drinks    Comment: occ  . Drug use: No  . Sexual activity: Not on file  Other Topics Concern  . Not on file  Social History Narrative   Unable to volunteer    Now on disability   Children now out of home   Daily caffeine    Social Determinants of Health   Financial Resource Strain:   . Difficulty of Paying Living Expenses: Not on file  Food Insecurity:   . Worried About Charity fundraiser in the Last Year: Not on file  . Ran Out of Food in the Last Year: Not on file  Transportation Needs:   . Lack of Transportation (Medical): Not on file  . Lack of Transportation (Non-Medical): Not on  file  Physical Activity:   . Days of Exercise per Week: Not on file  . Minutes of Exercise per Session: Not on file  Stress:   . Feeling of Stress : Not on file  Social Connections:   . Frequency of Communication with Friends and Family: Not on file  . Frequency of Social Gatherings with Friends and Family: Not on file  . Attends Religious Services: Not on file  . Active Member of Clubs or Organizations: Not on file  . Attends Archivist Meetings: Not on file  . Marital Status: Not on file    Outpatient Medications Prior to Visit  Medication Sig Dispense Refill  . amLODipine (NORVASC) 2.5 MG tablet Take 1 tablet (2.5 mg total) by mouth daily. 90 tablet 2  . ascorbic acid (VITAMIN C) 1000 MG tablet Take by mouth.    . cephALEXin (KEFLEX) 500 MG capsule Take 500 mg by mouth daily.     . Cholecalciferol (VITAMIN D-3) 5000 UNITS TABS Take 1 tablet by mouth daily.    Marland Kitchen CRANBERRY PO Take 2 capsules by mouth daily.    . DULoxetine (  CYMBALTA) 30 MG capsule Take 30 mg by mouth 2 (two) times daily.     . DULoxetine (CYMBALTA) 60 MG capsule Take 60 mg by mouth 2 (two) times daily.     . DULoxetine (CYMBALTA) 60 MG capsule Take 1 capsule by mouth 2 (two) times daily.    Marland Kitchen esomeprazole (NEXIUM) 20 MG capsule Take 20-40 mg by mouth daily as needed (acid reflux).    . gabapentin (NEURONTIN) 300 MG capsule Take 300 mg by mouth daily.     . meloxicam (MOBIC) 15 MG tablet Take 15 mg by mouth daily.    . Meth-Hyo-M Bl-Na Phos-Ph Sal (URIBEL) 118 MG CAPS Take 1 capsule by mouth daily as needed (bladder spasms). Using PRN    . natalizumab (TYSABRI) 300 MG/15ML injection Inject into the vein every 30 (thirty) days.     . Probiotic Product (PROBIOTIC PEARLS) CAPS Take 1 capsule by mouth daily.    . sodium chloride 0.9 % infusion Inject into the vein.    Marland Kitchen zoledronic acid (RECLAST) 5 MG/100ML SOLN injection Inject into the vein.     No facility-administered medications prior to visit.      EXAM:  BP 128/82   Pulse 87   Temp 98.3 F (36.8 C) (Oral)   Ht 5\' 6"  (1.676 m)   Wt 153 lb 3.2 oz (69.5 kg)   SpO2 96%   BMI 24.73 kg/m   Body mass index is 24.73 kg/m. Wt Readings from Last 3 Encounters:  04/20/20 153 lb 3.2 oz (69.5 kg)  03/19/20 161 lb (73 kg)  05/23/19 170 lb (77.1 kg)    GENERAL: vitals reviewed and listed above, alert, oriented, appears well hydrated and in no acute distress HEENT: atraumatic, conjunctiva  clear, no obvious abnormalities on inspection of external nose and ears OP masked  CV: HRRR, no clubbing cyanosis or  peripheral edema nl cap refill  MS: moves all extremities independent weak gatin but has  Cane   abd standing   Fullness left pelvic   Area just medial to ischial bone  Non tender  No hernia Laying no abd masses    sofrt tissue diffuse thickening  About 3 cm indiscrete  mobile Near left  anterior  Isch bone  Not attach  superficial  No skin changes  And non  tender  PSYCH: pleasant and cooperative, no obvious depression or anxiety Lab Results  Component Value Date   WBC 8.6 06/01/2018   HGB 13.2 06/01/2018   HCT 38.3 06/01/2018   PLT 271.0 06/01/2018   GLUCOSE 101 (H) 06/01/2018   CHOL 194 06/01/2018   TRIG 113.0 06/01/2018   HDL 64.10 06/01/2018   LDLDIRECT 110.0 04/23/2015   LDLCALC 107 (H) 06/01/2018   ALT 27 06/01/2018   AST 20 06/01/2018   NA 140 06/01/2018   K 4.4 06/01/2018   CL 102 06/01/2018   CREATININE 0.74 06/01/2018   BUN 17 06/01/2018   CO2 30 06/01/2018   TSH 1.01 04/27/2017   HGBA1C 5.2 02/13/2014   BP Readings from Last 3 Encounters:  04/20/20 128/82  03/19/20 128/72  05/23/19 (!) 146/90    ASSESSMENT AND PLAN:  Discussed the following assessment and plan:  Lump of skin  SCLEROSIS, MULTIPLE appears to be  Soft tissue related to superficial abd wall  and  Not a specific mass   Could be  Lipoma or ol fat necrosis etc   reassurance   Follow  and recheck if progressive sx size etc.  waitin  on flu vaccine jsut had shingrix  -Patient advised to return or notify health care team  if  new concerns arise.  There are no Patient Instructions on file for this visit.   [name]. Jaleia Hanke M.D.

## 2020-04-30 ENCOUNTER — Telehealth: Payer: Self-pay | Admitting: Internal Medicine

## 2020-04-30 NOTE — Progress Notes (Signed)
  Chronic Care Management   Outreach Note  04/30/2020 Name: [name] MRN: [id number] DOB: 09/26/1955  Referred by: Burnis Medin, MD Reason for referral : No chief complaint on file.   A second unsuccessful telephone outreach was attempted today. The patient was referred to pharmacist for assistance with care management and care coordination.  Follow Up Plan:   Carley Perdue [employer] Scheduler

## 2020-05-01 ENCOUNTER — Telehealth: Payer: Self-pay | Admitting: Internal Medicine

## 2020-05-01 DIAGNOSIS — L821 Other seborrheic keratosis: Secondary | ICD-10-CM | POA: Diagnosis not present

## 2020-05-01 DIAGNOSIS — L570 Actinic keratosis: Secondary | ICD-10-CM | POA: Diagnosis not present

## 2020-05-01 DIAGNOSIS — D2261 Melanocytic nevi of right upper limb, including shoulder: Secondary | ICD-10-CM | POA: Diagnosis not present

## 2020-05-01 DIAGNOSIS — D1801 Hemangioma of skin and subcutaneous tissue: Secondary | ICD-10-CM | POA: Diagnosis not present

## 2020-05-01 DIAGNOSIS — L814 Other melanin hyperpigmentation: Secondary | ICD-10-CM | POA: Diagnosis not present

## 2020-05-01 NOTE — Progress Notes (Signed)
  Chronic Care Management   Outreach Note  05/01/2020 Name: NITISHA CIVELLO MRN: [id number] DOB: 07-16-1956  Referred by: Burnis Medin, MD Reason for referral : No chief complaint on file.   Third unsuccessful telephone outreach was attempted today. The patient was referred to the pharmacist for assistance with care management and care coordination.   Follow Up Plan:   Carley Perdue [employer] Scheduler

## 2020-05-10 DIAGNOSIS — Z79899 Other long term (current) drug therapy: Secondary | ICD-10-CM | POA: Diagnosis not present

## 2020-05-10 DIAGNOSIS — G35 Multiple sclerosis: Secondary | ICD-10-CM | POA: Diagnosis not present

## 2020-05-16 DIAGNOSIS — R339 Retention of urine, unspecified: Secondary | ICD-10-CM | POA: Diagnosis not present

## 2020-05-21 DIAGNOSIS — Z23 Encounter for immunization: Secondary | ICD-10-CM | POA: Diagnosis not present

## 2020-06-07 DIAGNOSIS — R339 Retention of urine, unspecified: Secondary | ICD-10-CM | POA: Diagnosis not present

## 2020-06-07 DIAGNOSIS — G35 Multiple sclerosis: Secondary | ICD-10-CM | POA: Diagnosis not present

## 2020-06-11 DIAGNOSIS — Z20822 Contact with and (suspected) exposure to covid-19: Secondary | ICD-10-CM | POA: Diagnosis not present

## 2020-07-05 DIAGNOSIS — G35 Multiple sclerosis: Secondary | ICD-10-CM | POA: Diagnosis not present

## 2020-07-19 DIAGNOSIS — R339 Retention of urine, unspecified: Secondary | ICD-10-CM | POA: Diagnosis not present

## 2020-07-27 DIAGNOSIS — Z1231 Encounter for screening mammogram for malignant neoplasm of breast: Secondary | ICD-10-CM | POA: Diagnosis not present

## 2020-07-28 HISTORY — PX: CATARACT EXTRACTION W/ INTRAOCULAR LENS IMPLANT: SHX1309

## 2020-07-28 HISTORY — PX: CATARACT EXTRACTION: SUR2

## 2020-07-31 ENCOUNTER — Other Ambulatory Visit: Payer: Self-pay

## 2020-07-31 ENCOUNTER — Ambulatory Visit: Payer: Medicare Other

## 2020-07-31 NOTE — Progress Notes (Signed)
Erroneous Encounter

## 2020-08-06 DIAGNOSIS — G35 Multiple sclerosis: Secondary | ICD-10-CM | POA: Diagnosis not present

## 2020-08-06 DIAGNOSIS — Z79899 Other long term (current) drug therapy: Secondary | ICD-10-CM | POA: Diagnosis not present

## 2020-08-07 ENCOUNTER — Encounter: Payer: Self-pay | Admitting: Internal Medicine

## 2020-08-07 ENCOUNTER — Telehealth (INDEPENDENT_AMBULATORY_CARE_PROVIDER_SITE_OTHER): Payer: Medicare Other | Admitting: Internal Medicine

## 2020-08-07 DIAGNOSIS — Z79899 Other long term (current) drug therapy: Secondary | ICD-10-CM | POA: Diagnosis not present

## 2020-08-07 DIAGNOSIS — J0191 Acute recurrent sinusitis, unspecified: Secondary | ICD-10-CM

## 2020-08-07 DIAGNOSIS — J22 Unspecified acute lower respiratory infection: Secondary | ICD-10-CM | POA: Diagnosis not present

## 2020-08-07 DIAGNOSIS — G35 Multiple sclerosis: Secondary | ICD-10-CM

## 2020-08-07 MED ORDER — AMOXICILLIN-POT CLAVULANATE 875-125 MG PO TABS: 1.0000 | ORAL_TABLET | Freq: Two times a day (BID) | ORAL | 0 refills | Status: DC

## 2020-08-07 NOTE — Progress Notes (Signed)
Virtual Visit via Video Note  I connected with@ on 08/07/20 at  4:00 PM EST by a video enabled telemedicine application and verified that I am speaking with the correct person using two identifiers. Location patient: home Location provider:work  office Persons participating in the virtual visit: patient, provider  [place] national recommendations  regarding COVID 19 pandemic   video visit is advised over in office visit for this patient.  Patient aware  of the limitations of evaluation and management by telemedicine and  availability of in person appointments. and agreed to proceed.   HPI: [name] presents for video visit onset about 4 days ago of symptoms like her sinus infections with facial pressure maxillary and bifrontal without associated fever congestion and postnasal drainage minimal cough. Note specific exposures she has triple vaccination for COVID last 1 in October 25.  She states this acts like her sinus infection but not sure. Has not been tested for COVID.  Recently  Is on daily Keflex 500 mg antibiotic suppression in addition to probiotics and cranberry which has been helpful for her.   ROS: See pertinent positives and negatives per HPI.  No chest pain shortness of breath fever.  Past Medical History:  Diagnosis Date  . Anemia   . Blood in stool, frank 12/01/2011  . Depressive reaction   . Diverticulitis   . Diverticulosis   . Frequent UTI LAST 7/13  . 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 Mood disorder ([place])   . MS (multiple sclerosis) ([place])   . Rhinitis   . Superficial phlebitis   . Varicose veins     Past Surgical History:  Procedure Laterality Date  . ABDOMINAL HYSTERECTOMY  2008  . BLADDER SURGERY  2008   Tack  . SHOULDER ARTHROSCOPY WITH ROTATOR CUFF REPAIR AND SUBACROMIAL DECOMPRESSION  06/01/2012   Procedure: SHOULDER ARTHROSCOPY WITH ROTATOR CUFF REPAIR AND SUBACROMIAL DECOMPRESSION;  Surgeon: Magnus Sinning, MD;  Location: [place];  Service: Orthopedics;  Laterality: Left;  LEFT SHOULDER ARTHROSCOPY WITH SUBACROMIAL DECOMPRESION AND DEBRIDEMENT OF PARTIAL ROTATOR CUFF TEAR  . TONSILLECTOMY AND ADENOIDECTOMY      Family History  Problem Relation Age of Onset  . Lung cancer Father   . Colon cancer Neg Hx     Social History   Tobacco Use  . Smoking status: Never Smoker  . Smokeless tobacco: Never Used  Vaping Use  . Vaping Use: Never used  Substance Use Topics  . Alcohol use: Yes    Alcohol/week: 0.0 standard drinks    Comment: occ  . Drug use: No      Current Outpatient Medications:  .  amLODipine (NORVASC) 2.5 MG tablet, Take 1 tablet (2.5 mg total) by mouth daily., Disp: 90 tablet, Rfl: 2 .  amoxicillin-clavulanate (AUGMENTIN) 875-125 MG tablet, Take 1 tablet by mouth every 12 (twelve) hours. If needed for sinusitis( hold the keflex when taking this antibiotic  ), Disp: 14 tablet, Rfl: 0 .  ascorbic acid (VITAMIN C) 1000 MG tablet, Take by mouth., Disp: , Rfl:  .  cephALEXin (KEFLEX) 500 MG capsule, Take 500 mg by mouth daily. , Disp: , Rfl:  .  Cholecalciferol (VITAMIN D-3) 5000 UNITS TABS, Take 1 tablet by mouth daily., Disp: , Rfl:  .  CRANBERRY PO, Take 2 capsules by mouth daily., Disp: , Rfl:  .  DULoxetine (CYMBALTA) 30 MG capsule, Take 30 mg by mouth 2 (two) times daily. , Disp: , Rfl:  .  DULoxetine (  CYMBALTA) 60 MG capsule, Take 60 mg by mouth 2 (two) times daily. , Disp: , Rfl:  .  DULoxetine (CYMBALTA) 60 MG capsule, Take 1 capsule by mouth 2 (two) times daily., Disp: , Rfl:  .  esomeprazole (NEXIUM) 20 MG capsule, Take 20-40 mg by mouth daily as needed (acid reflux)., Disp: , Rfl:  .  gabapentin (NEURONTIN) 300 MG capsule, Take 300 mg by mouth daily. , Disp: , Rfl:  .  meloxicam (MOBIC) 15 MG tablet, Take 15 mg by mouth daily., Disp: , Rfl:  .  Meth-Hyo-M Bl-Na Phos-Ph Sal (URIBEL) 118 MG CAPS, Take 1 capsule by mouth daily as needed (bladder spasms). Using PRN, Disp: , Rfl:  .   natalizumab (TYSABRI) 300 MG/15ML injection, Inject into the vein every 30 (thirty) days., Disp: , Rfl:  .  Probiotic Product (PROBIOTIC PEARLS) CAPS, Take 1 capsule by mouth daily., Disp: , Rfl:  .  sodium chloride 0.9 % infusion, Inject into the vein., Disp: , Rfl:  .  zoledronic acid (RECLAST) 5 MG/100ML SOLN injection, Inject into the vein., Disp: , Rfl:   EXAM: BP Readings from Last 3 Encounters:  04/20/20 128/82  03/19/20 128/72  05/23/19 (!) 146/90    VITALS per patient if applicable:  GENERAL: alert, oriented, appears well and in no acute distress  HEENT: atraumatic, conjunttiva clear, no obvious abnormalities on inspection of external nose and ears looks mildly uncomfortable points to maxillary and bifrontal area no significant cough during exam.  NECK: normal movements of the head and neck  LUNGS: on inspection no signs of respiratory distress, breathing rate appears normal, no obvious gross SOB, gasping or wheezing  CV: no obvious cyanosis PSYCH/NEURO: pleasant and cooperative, speech and thought processing grossly intact Lab Results  Component Value Date   WBC 8.6 06/01/2018   HGB 13.2 06/01/2018   HCT 38.3 06/01/2018   PLT 271.0 06/01/2018   GLUCOSE 101 (H) 06/01/2018   CHOL 194 06/01/2018   TRIG 113.0 06/01/2018   HDL 64.10 06/01/2018   LDLDIRECT 110.0 04/23/2015   LDLCALC 107 (H) 06/01/2018   ALT 27 06/01/2018   AST 20 06/01/2018   NA 140 06/01/2018   K 4.4 06/01/2018   CL 102 06/01/2018   CREATININE 0.74 06/01/2018   BUN 17 06/01/2018   CO2 30 06/01/2018   TSH 1.01 04/27/2017   HGBA1C 5.2 02/13/2014    ASSESSMENT AND PLAN:  Discussed the following assessment and plan:    ICD-10-CM   1. Acute respiratory infection  J22   2. Acute recurrent sinusitis, unspecified location  J01.91   3. SCLEROSIS, MULTIPLE  G35   4. High risk medication use  Z79.899   This illness could be primarily viral based on context and history and past history.  At this time  would not add antibiotic but will send Augmentin in to take if needed not improve near the end of the week going 10 days with persistent pain or worsening otherwise supportive care ,  She has high risk consider being tested for COVID reasons discussed.  She has underlying risk and is on suppressive therapy. Discussed alarm symptoms for which she should reach out for more help. Counseled.   Expectant management and discussion of plan and treatment with opportunity to ask questions and all were answered. The patient agreed with the plan and demonstrated an understanding of the instructions.   Advised to call back or seek an in-person evaluation if worsening  or having  further concerns . No  follow-ups on file.   Shanon Ace, MD

## 2020-08-20 ENCOUNTER — Ambulatory Visit (INDEPENDENT_AMBULATORY_CARE_PROVIDER_SITE_OTHER): Payer: Medicare Other

## 2020-08-20 DIAGNOSIS — Z0000 Encounter for general adult medical examination without abnormal findings: Secondary | ICD-10-CM

## 2020-08-20 NOTE — Progress Notes (Signed)
Subjective:   [name] is a 65 y.o. female who presents for an Initial Medicare Annual Wellness Visit.  I connected with Paulett Kaufhold  today by telephone and verified that I am speaking with the correct person using two identifiers. Location patient: home Location provider: work Persons participating in the virtual visit: patient, provider.   I discussed the limitations, risks, security and privacy concerns of performing an evaluation and management service by telephone and the availability of in person appointments. I also discussed with the patient that there may be a patient responsible charge related to this service. The patient expressed understanding and verbally consented to this telephonic visit.    Interactive audio and video telecommunications were attempted between this provider and patient, however failed, due to patient having technical difficulties OR patient did not have access to video capability.  We continued and completed visit with audio only.      Review of Systems    N/A  Cardiac Risk Factors include: advanced age (>40men, >54 women);dyslipidemia;hypertension     Objective:    Today's Vitals   There is no height or weight on file to calculate BMI.  Advanced Directives 08/20/2020 05/12/2019 03/05/2019 02/02/2018 12/04/2015 07/12/2015 04/03/2015  Does Patient Have a Medical Advance Directive? Yes Yes No Yes Yes No No  Type of Paramedic of [place];Living will [place];Living will - - Living will;Healthcare Power of Attorney - -  Does patient want to make changes to medical advance directive? No - Patient declined No - Patient declined - - - - -  Copy of [place] in Chart? No - copy requested - - - - - -  Would patient like information on creating a medical advance directive? - - - - - No - patient declined information No - patient declined information  Pre-existing out of facility DNR  order (yellow form or pink MOST form) - - - - - - -    Current Medications (verified) Outpatient Encounter Medications as of 08/20/2020  Medication Sig  . amLODipine (NORVASC) 2.5 MG tablet Take 1 tablet (2.5 mg total) by mouth daily.  Marland Kitchen ascorbic acid (VITAMIN C) 1000 MG tablet Take by mouth.  . cephALEXin (KEFLEX) 500 MG capsule Take 500 mg by mouth daily.   . Cholecalciferol (VITAMIN D-3) 5000 UNITS TABS Take 1 tablet by mouth daily.  Marland Kitchen CRANBERRY PO Take 2 capsules by mouth daily.  . DULoxetine (CYMBALTA) 60 MG capsule Take 60 mg by mouth 2 (two) times daily.   . DULoxetine (CYMBALTA) 60 MG capsule Take 1 capsule by mouth 2 (two) times daily.  Marland Kitchen esomeprazole (NEXIUM) 20 MG capsule Take 20-40 mg by mouth daily as needed (acid reflux).  . gabapentin (NEURONTIN) 300 MG capsule Take 300 mg by mouth daily.   . meloxicam (MOBIC) 15 MG tablet Take 15 mg by mouth daily.  . Meth-Hyo-M Bl-Na Phos-Ph Sal (URIBEL) 118 MG CAPS Take 1 capsule by mouth daily as needed (bladder spasms). Using PRN  . natalizumab (TYSABRI) 300 MG/15ML injection Inject into the vein every 30 (thirty) days.  . Probiotic Product (PROBIOTIC PEARLS) CAPS Take 1 capsule by mouth daily.  . sodium chloride 0.9 % infusion Inject into the vein.  . [DISCONTINUED] amoxicillin-clavulanate (AUGMENTIN) 875-125 MG tablet Take 1 tablet by mouth every 12 (twelve) hours. If needed for sinusitis( hold the keflex when taking this antibiotic  )  . [DISCONTINUED] DULoxetine (CYMBALTA) 30 MG capsule Take 30 mg by  mouth 2 (two) times daily.   . [DISCONTINUED] zoledronic acid (RECLAST) 5 MG/100ML SOLN injection Inject into the vein.   No facility-administered encounter medications on file as of 08/20/2020.    Allergies (verified) Meclizine hcl, Tramadol, Codeine, Hydrocodone, Omeprazole magnesium, Oxycodone, Prilosec [omeprazole magnesium], and Tramadol hcl   History: Past Medical History:  Diagnosis Date  . Anemia   . Blood in stool, frank  12/01/2011  . Depressive reaction   . Diverticulitis   . Diverticulosis   . Frequent UTI LAST 7/13  . 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 Mood disorder ([facility])   . MS (multiple sclerosis) ([facility])   . Rhinitis   . Superficial phlebitis   . Varicose veins    Past Surgical History:  Procedure Laterality Date  . ABDOMINAL HYSTERECTOMY  2008  . BLADDER SURGERY  2008   Tack  . SHOULDER ARTHROSCOPY WITH ROTATOR CUFF REPAIR AND SUBACROMIAL DECOMPRESSION  06/01/2012   Procedure: SHOULDER ARTHROSCOPY WITH ROTATOR CUFF REPAIR AND SUBACROMIAL DECOMPRESSION;  Surgeon: Drucilla Schmidt, MD;  Location: [facility];  Service: Orthopedics;  Laterality: Left;  LEFT SHOULDER ARTHROSCOPY WITH SUBACROMIAL DECOMPRESION AND DEBRIDEMENT OF PARTIAL ROTATOR CUFF TEAR  . TONSILLECTOMY AND ADENOIDECTOMY     Family History  Problem Relation Age of Onset  . Lung cancer Father   . Colon cancer Neg Hx    Social History   Socioeconomic History  . Marital status: Married    Spouse name: Not on file  . Number of children: 4  . Years of education: Not on file  . Highest education level: Not on file  Occupational History  . Occupation: Disability  Tobacco Use  . Smoking status: Never Smoker  . Smokeless tobacco: Never Used  Vaping Use  . Vaping Use: Never used  Substance and Sexual Activity  . Alcohol use: Yes    Alcohol/week: 0.0 standard drinks    Comment: occ  . Drug use: No  . Sexual activity: Not on file  Other Topics Concern  . Not on file  Social History Narrative   Unable to volunteer    Now on disability   Children now out of home   Daily caffeine    Social Determinants of Health   Financial Resource Strain: Low Risk   . Difficulty of Paying Living Expenses: Not hard at all  Food Insecurity: No Food Insecurity  . Worried About Programme researcher, broadcasting/film/video in the Last Year: Never true  . Ran Out of Food in the Last Year: Never true  Transportation Needs: No Transportation Needs  . Lack  of Transportation (Medical): No  . Lack of Transportation (Non-Medical): No  Physical Activity: Insufficiently Active  . Days of Exercise per Week: 2 days  . Minutes of Exercise per Session: 60 min  Stress: No Stress Concern Present  . Feeling of Stress : Not at all  Social Connections: Socially Integrated  . Frequency of Communication with Friends and Family: More than three times a week  . Frequency of Social Gatherings with Friends and Family: More than three times a week  . Attends Religious Services: More than 4 times per year  . Active Member of Clubs or Organizations: Yes  . Attends Banker Meetings: More than 4 times per year  . Marital Status: Married    Tobacco Counseling Counseling given: Not Answered   Clinical Intake:  Pre-visit preparation completed: Yes  Pain : No/denies pain     Nutritional Risks: Unintentional weight  loss Diabetes: No  How often do you need to have someone help you when you read instructions, pamphlets, or other written materials from your doctor or pharmacy?: 1 - Never What is the last grade level you completed in school?: Bachelors Degree  Diabetic?No   Interpreter Needed?: No  Information entered by :: [place] of Daily Living In your present state of health, do you have any difficulty performing the following activities: 08/20/2020  Hearing? N  Vision? N  Walking or climbing stairs? Y  Dressing or bathing? N  Doing errands, shopping? N  Preparing Food and eating ? N  Using the Toilet? N  In the past six months, have you accidently leaked urine? Y  Do you have problems with loss of bowel control? N  Managing your Medications? N  Managing your Finances? N  Housekeeping or managing your Housekeeping? N  Some recent data might be hidden    Patient Care Team: [name], [name], MD as PCP - General Konrad Penta, MD as Referring Physician (Obstetrics and Gynecology) Ceasar Mons, MD as  Consulting Physician (Urology) Sydnee Levans, MD as Consulting Physician (Dermatology) Karl Luke, MD as Referring Physician (Neurology)  Indicate any recent Medical Services you may have received from other than [facility] providers in the past year (date may be approximate).     Assessment:   This is a routine wellness examination for [facility].  Hearing/Vision screen  Hearing Screening   125Hz  250Hz  500Hz  1000Hz  2000Hz  3000Hz  4000Hz  6000Hz  8000Hz   Right ear:           Left ear:           Vision Screening Comments: Patient states gets eyes examined once per year.   Dietary issues and exercise activities discussed: Current Exercise Habits: Structured exercise class, Type of exercise: walking, Time (Minutes): 60, Frequency (Times/Week): 2, Weekly Exercise (Minutes/Week): 120, Intensity: Mild  Goals    . Patient Stated     To see family and see son get married Continue to feel good       Depression Screen PHQ 2/9 Scores 08/20/2020 09/27/2018 06/01/2018 02/02/2018 04/27/2017 03/17/2016  PHQ - 2 Score 0 0 0 0 0 0    Fall Risk Fall Risk  08/20/2020 02/02/2018 04/27/2017 03/17/2016  Falls in the past year? 0 Yes - Yes  Number falls in past yr: 0 2 or more - 2 or more  Comment - 3 or 4 times  - -  Injury with Fall? 0 Yes - Yes  Comment - did not have to go to the doctor  - -  Risk Factor Category  - - - High Fall Risk  Risk for fall due to : History of fall(s);Impaired balance/gait Impaired balance/gait;Impaired mobility History of fall(s);Impaired balance/gait Impaired balance/gait  Follow up Falls evaluation completed;Falls prevention discussed Education provided - Falls prevention discussed;Education provided  Comment - loses her balance; not familiar with surrounding  - doing very well with assistance     FALL RISK PREVENTION PERTAINING TO THE HOME:  Any stairs in or around the home? No  If so, are there any without handrails? No  Home free of loose throw rugs in walkways, pet  beds, electrical cords, etc? Yes  Adequate lighting in your home to reduce risk of falls? Yes   ASSISTIVE DEVICES UTILIZED TO PREVENT FALLS:  Life alert? No  Use of a cane, walker or w/c? Yes  Grab bars in the bathroom? No  Shower chair or bench in  shower? No  Elevated toilet seat or a handicapped toilet? Yes     Cognitive Function:     6CIT Screen 08/20/2020  What time? 0 points  Count back from 20 0 points  Months in reverse 0 points  Repeat phrase 0 points    Immunizations Immunization History  Administered Date(s) Administered  . Influenza Whole 05/07/2010, 06/06/2011  . Influenza, Quadrivalent, Recombinant, Inj, Pf 04/21/2018, 05/11/2019, 05/11/2019  . Influenza,inj,Quad PF,6+ Mos 05/10/2014, 04/23/2015, 04/23/2017  . Influenza-Unspecified 04/24/2017, 05/21/2020  . PFIZER(Purple Top)SARS-COV-2 Vaccination 08/17/2019, 09/07/2019, 10/21/2019, 05/21/2020  . Tdap 02/05/2011, 07/12/2015  . Zoster 05/10/2014  . Zoster Recombinat (Shingrix) 05/11/2019    TDAP status: Up to date  Flu Vaccine status: Up to date  Pneumococcal vaccine status: Due, Education has been provided regarding the importance of this vaccine. Advised may receive this vaccine at local pharmacy or Health Dept. Aware to provide a copy of the vaccination record if obtained from local pharmacy or Health Dept. Verbalized acceptance and understanding.  Covid-19 vaccine status: Information provided on how to obtain vaccines.   Qualifies for Shingles Vaccine? Yes   Zostavax completed Yes   Shingrix Completed?: Yes  Screening Tests Health Maintenance  Topic Date Due  . PNA vac Low Risk Adult (1 of 2 - PCV13) Never done  . MAMMOGRAM  02/03/2020  . COLONOSCOPY (Pts 45-21yrs Insurance coverage will need to be confirmed)  01/09/2025  . TETANUS/TDAP  07/11/2025  . INFLUENZA VACCINE  Completed  . DEXA SCAN  Completed  . COVID-19 Vaccine  Completed  . Hepatitis C Screening  Discontinued    Health  Maintenance  Health Maintenance Due  Topic Date Due  . PNA vac Low Risk Adult (1 of 2 - PCV13) Never done  . MAMMOGRAM  02/03/2020    Colorectal cancer screening: Type of screening: Colonoscopy. Completed 01/10/2015. Repeat every 10 years  Mammogram status: Completed 06/2020. Repeat every year  Bone Density status: Completed 04/28/2016. Results reflect: Bone density results: OSTEOPENIA. Repeat every 5 years.  Lung Cancer Screening: (Low Dose CT Chest recommended if Age 24-80 years, 30 pack-year currently smoking OR have quit w/in 15years.) does not qualify.   Lung Cancer Screening Referral: N/A   Additional Screening:  Hepatitis C Screening: does qualify;   Vision Screening: Recommended annual ophthalmology exams for early detection of glaucoma and other disorders of the eye. Is the patient up to date with their annual eye exam?  Yes  Who is the provider or what is the name of the office in which the patient attends annual eye exams? Dr. Jola Schmidt  If pt is not established with a provider, would they like to be referred to a provider to establish care? No .   Dental Screening: Recommended annual dental exams for proper oral hygiene  Community Resource Referral / Chronic Care Management: CRR required this visit?  No   CCM required this visit?  No      Plan:     I have personally reviewed and noted the following in the patient's chart:   . Medical and social history . Use of alcohol, tobacco or illicit drugs  . Current medications and supplements . Functional ability and status . Nutritional status . Physical activity . Advanced directives . List of other physicians . Hospitalizations, surgeries, and ER visits in previous 12 months . Vitals . Screenings to include cognitive, depression, and falls . Referrals and appointments  In addition, I have reviewed and discussed with patient certain preventive protocols, quality  metrics, and best practice recommendations.  A written personalized care plan for preventive services as well as general preventive health recommendations were provided to patient.     Ofilia Neas, LPN   10/09/9456   Nurse Notes: None

## 2020-08-20 NOTE — Patient Instructions (Signed)
[name] , Thank you for taking time to come for your Medicare Wellness Visit. I appreciate your ongoing commitment to your health goals. Please review the following plan we discussed and let me know if I can assist you in the future.   Screening recommendations/referrals: Colonoscopy: Up to date, next due 01/09/2025 Mammogram: Up to date, next due 06/2021 Bone Density: Up to date, next due 04/28/2021 Recommended yearly ophthalmology/optometry visit for glaucoma screening and checkup Recommended yearly dental visit for hygiene and checkup  Vaccinations: Influenza vaccine: Up to date, next due fall 2022  Pneumococcal vaccine: Currently due, you may receive in our office or at your local pharmacy  Tdap vaccine: Up to date next due 07/11/2025 Shingles vaccine: Completed series     Advanced directives: Please bring in copies of your advanced medical directives so that we may scan them into your chart.   Conditions/risks identified: none   Next appointment: None    Preventive Care 65 Years and Older, Female Preventive care refers to lifestyle choices and visits with your health care provider that can promote health and wellness. What does preventive care include?  A yearly physical exam. This is also called an annual well check.  Dental exams once or twice a year.  Routine eye exams. Ask your health care provider how often you should have your eyes checked.  Personal lifestyle choices, including:  Daily care of your teeth and gums.  Regular physical activity.  Eating a healthy diet.  Avoiding tobacco and drug use.  Limiting alcohol use.  Practicing safe sex.  Taking low-dose aspirin every day.  Taking vitamin and mineral supplements as recommended by your health care provider. What happens during an annual well check? The services and screenings done by your health care provider during your annual well check will depend on your age, overall health, lifestyle risk  factors, and family history of disease. Counseling  Your health care provider may ask you questions about your:  Alcohol use.  Tobacco use.  Drug use.  Emotional well-being.  Home and relationship well-being.  Sexual activity.  Eating habits.  History of falls.  Memory and ability to understand (cognition).  Work and work Statistician.  Reproductive health. Screening  You may have the following tests or measurements:  Height, weight, and BMI.  Blood pressure.  Lipid and cholesterol levels. These may be checked every 5 years, or more frequently if you are over 42 years old.  Skin check.  Lung cancer screening. You may have this screening every year starting at age 9 if you have a 30-pack-year history of smoking and currently smoke or have quit within the past 15 years.  Fecal occult blood test (FOBT) of the stool. You may have this test every year starting at age 62.  Flexible sigmoidoscopy or colonoscopy. You may have a sigmoidoscopy every 5 years or a colonoscopy every 10 years starting at age 91.  Hepatitis C blood test.  Hepatitis B blood test.  Sexually transmitted disease (STD) testing.  Diabetes screening. This is done by checking your blood sugar (glucose) after you have not eaten for a while (fasting). You may have this done every 1-3 years.  Bone density scan. This is done to screen for osteoporosis. You may have this done starting at age 72.  Mammogram. This may be done every 1-2 years. Talk to your health care provider about how often you should have regular mammograms. Talk with your health care provider about your test results, treatment options, and  if necessary, the need for more tests. Vaccines  Your health care provider may recommend certain vaccines, such as:  Influenza vaccine. This is recommended every year.  Tetanus, diphtheria, and acellular pertussis (Tdap, Td) vaccine. You may need a Td booster every 10 years.  Zoster vaccine. You  may need this after age 84.  Pneumococcal 13-valent conjugate (PCV13) vaccine. One dose is recommended after age 74.  Pneumococcal polysaccharide (PPSV23) vaccine. One dose is recommended after age 47. Talk to your health care provider about which screenings and vaccines you need and how often you need them. This information is not intended to replace advice given to you by your health care provider. Make sure you discuss any questions you have with your health care provider. Document Released: 08/10/2015 Document Revised: 04/02/2016 Document Reviewed: 05/15/2015 Elsevier Interactive Patient Education  2017 [place] Prevention in the Home Falls can cause injuries. They can happen to people of all ages. There are many things you can do to make your home safe and to help prevent falls. What can I do on the outside of my home?  Regularly fix the edges of walkways and driveways and fix any cracks.  Remove anything that might make you trip as you walk through a door, such as a raised step or threshold.  Trim any bushes or trees on the path to your home.  Use bright outdoor lighting.  Clear any walking paths of anything that might make someone trip, such as rocks or tools.  Regularly check to see if handrails are loose or broken. Make sure that both sides of any steps have handrails.  Any raised decks and porches should have guardrails on the edges.  Have any leaves, snow, or ice cleared regularly.  Use sand or salt on walking paths during winter.  Clean up any spills in your garage right away. This includes oil or grease spills. What can I do in the bathroom?  Use night lights.  Install grab bars by the toilet and in the tub and shower. Do not use towel bars as grab bars.  Use non-skid mats or decals in the tub or shower.  If you need to sit down in the shower, use a plastic, non-slip stool.  Keep the floor dry. Clean up any water that spills on the floor as soon as  it happens.  Remove soap buildup in the tub or shower regularly.  Attach bath mats securely with double-sided non-slip rug tape.  Do not have throw rugs and other things on the floor that can make you trip. What can I do in the bedroom?  Use night lights.  Make sure that you have a light by your bed that is easy to reach.  Do not use any sheets or blankets that are too big for your bed. They should not hang down onto the floor.  Have a firm chair that has side arms. You can use this for support while you get dressed.  Do not have throw rugs and other things on the floor that can make you trip. What can I do in the kitchen?  Clean up any spills right away.  Avoid walking on wet floors.  Keep items that you use a lot in easy-to-reach places.  If you need to reach something above you, use a strong step stool that has a grab bar.  Keep electrical cords out of the way.  Do not use floor polish or wax that makes floors slippery. If you  must use wax, use non-skid floor wax.  Do not have throw rugs and other things on the floor that can make you trip. What can I do with my stairs?  Do not leave any items on the stairs.  Make sure that there are handrails on both sides of the stairs and use them. Fix handrails that are broken or loose. Make sure that handrails are as long as the stairways.  Check any carpeting to make sure that it is firmly attached to the stairs. Fix any carpet that is loose or worn.  Avoid having throw rugs at the top or bottom of the stairs. If you do have throw rugs, attach them to the floor with carpet tape.  Make sure that you have a light switch at the top of the stairs and the bottom of the stairs. If you do not have them, ask someone to add them for you. What else can I do to help prevent falls?  Wear shoes that:  Do not have high heels.  Have rubber bottoms.  Are comfortable and fit you well.  Are closed at the toe. Do not wear sandals.  If  you use a stepladder:  Make sure that it is fully opened. Do not climb a closed stepladder.  Make sure that both sides of the stepladder are locked into place.  Ask someone to hold it for you, if possible.  Clearly mark and make sure that you can see:  Any grab bars or handrails.  First and last steps.  Where the edge of each step is.  Use tools that help you move around (mobility aids) if they are needed. These include:  Canes.  Walkers.  Scooters.  Crutches.  Turn on the lights when you go into a dark area. Replace any light bulbs as soon as they burn out.  Set up your furniture so you have a clear path. Avoid moving your furniture around.  If any of your floors are uneven, fix them.  If there are any pets around you, be aware of where they are.  Review your medicines with your doctor. Some medicines can make you feel dizzy. This can increase your chance of falling. Ask your doctor what other things that you can do to help prevent falls. This information is not intended to replace advice given to you by your health care provider. Make sure you discuss any questions you have with your health care provider. Document Released: 05/10/2009 Document Revised: 12/20/2015 Document Reviewed: 08/18/2014 Elsevier Interactive Patient Education  2017 [employer].

## 2020-08-21 DIAGNOSIS — R339 Retention of urine, unspecified: Secondary | ICD-10-CM | POA: Diagnosis not present

## 2020-08-22 DIAGNOSIS — L659 Nonscarring hair loss, unspecified: Secondary | ICD-10-CM | POA: Diagnosis not present

## 2020-08-27 ENCOUNTER — Other Ambulatory Visit: Payer: Self-pay

## 2020-08-29 DIAGNOSIS — M79672 Pain in left foot: Secondary | ICD-10-CM | POA: Diagnosis not present

## 2020-09-04 DIAGNOSIS — R202 Paresthesia of skin: Secondary | ICD-10-CM | POA: Diagnosis not present

## 2020-09-04 DIAGNOSIS — R0781 Pleurodynia: Secondary | ICD-10-CM | POA: Diagnosis not present

## 2020-09-04 DIAGNOSIS — G319 Degenerative disease of nervous system, unspecified: Secondary | ICD-10-CM | POA: Diagnosis not present

## 2020-09-04 DIAGNOSIS — G9389 Other specified disorders of brain: Secondary | ICD-10-CM | POA: Diagnosis not present

## 2020-09-04 DIAGNOSIS — G35 Multiple sclerosis: Secondary | ICD-10-CM | POA: Diagnosis not present

## 2020-09-25 DIAGNOSIS — M79672 Pain in left foot: Secondary | ICD-10-CM | POA: Diagnosis not present

## 2020-10-02 DIAGNOSIS — Z79899 Other long term (current) drug therapy: Secondary | ICD-10-CM | POA: Diagnosis not present

## 2020-10-02 DIAGNOSIS — G35 Multiple sclerosis: Secondary | ICD-10-CM | POA: Diagnosis not present

## 2020-10-04 DIAGNOSIS — R339 Retention of urine, unspecified: Secondary | ICD-10-CM | POA: Diagnosis not present

## 2020-10-05 DIAGNOSIS — H5201 Hypermetropia, right eye: Secondary | ICD-10-CM | POA: Diagnosis not present

## 2020-10-05 DIAGNOSIS — H2513 Age-related nuclear cataract, bilateral: Secondary | ICD-10-CM | POA: Diagnosis not present

## 2020-10-05 DIAGNOSIS — Z79899 Other long term (current) drug therapy: Secondary | ICD-10-CM | POA: Diagnosis not present

## 2020-10-09 DIAGNOSIS — M79672 Pain in left foot: Secondary | ICD-10-CM | POA: Diagnosis not present

## 2020-10-16 DIAGNOSIS — M79672 Pain in left foot: Secondary | ICD-10-CM | POA: Diagnosis not present

## 2020-10-24 DIAGNOSIS — C44629 Squamous cell carcinoma of skin of left upper limb, including shoulder: Secondary | ICD-10-CM | POA: Diagnosis not present

## 2020-10-29 DIAGNOSIS — M79672 Pain in left foot: Secondary | ICD-10-CM | POA: Diagnosis not present

## 2020-10-30 DIAGNOSIS — M79672 Pain in left foot: Secondary | ICD-10-CM | POA: Diagnosis not present

## 2020-10-30 DIAGNOSIS — R0781 Pleurodynia: Secondary | ICD-10-CM | POA: Diagnosis not present

## 2020-10-30 DIAGNOSIS — Z79899 Other long term (current) drug therapy: Secondary | ICD-10-CM | POA: Diagnosis not present

## 2020-10-30 DIAGNOSIS — G35 Multiple sclerosis: Secondary | ICD-10-CM | POA: Diagnosis not present

## 2020-10-31 DIAGNOSIS — N3020 Other chronic cystitis without hematuria: Secondary | ICD-10-CM | POA: Diagnosis not present

## 2020-11-05 DIAGNOSIS — R339 Retention of urine, unspecified: Secondary | ICD-10-CM | POA: Diagnosis not present

## 2020-11-27 DIAGNOSIS — M79672 Pain in left foot: Secondary | ICD-10-CM | POA: Diagnosis not present

## 2020-11-28 DIAGNOSIS — G35 Multiple sclerosis: Secondary | ICD-10-CM | POA: Diagnosis not present

## 2020-11-28 DIAGNOSIS — M79672 Pain in left foot: Secondary | ICD-10-CM | POA: Diagnosis not present

## 2020-11-28 DIAGNOSIS — Z79899 Other long term (current) drug therapy: Secondary | ICD-10-CM | POA: Diagnosis not present

## 2020-11-29 DIAGNOSIS — C44629 Squamous cell carcinoma of skin of left upper limb, including shoulder: Secondary | ICD-10-CM | POA: Diagnosis not present

## 2020-12-04 DIAGNOSIS — M79672 Pain in left foot: Secondary | ICD-10-CM | POA: Diagnosis not present

## 2020-12-05 DIAGNOSIS — H2512 Age-related nuclear cataract, left eye: Secondary | ICD-10-CM | POA: Diagnosis not present

## 2020-12-07 DIAGNOSIS — R339 Retention of urine, unspecified: Secondary | ICD-10-CM | POA: Diagnosis not present

## 2020-12-10 ENCOUNTER — Telehealth: Payer: Self-pay | Admitting: Internal Medicine

## 2020-12-10 NOTE — Progress Notes (Signed)
  Chronic Care Management   Note  12/10/2020 Name: [name] MRN: [id number] DOB: 11/04/1955  [name] is a 66 y.o. year old female who is a primary care patient of [name], [name], MD. I reached out to [place] by phone today in response to a referral sent by [name]'s PCP, [name], [name], MD.   [name] was given information about Chronic Care Management services today including:  1. CCM service includes personalized support from designated clinical staff supervised by her physician, including individualized plan of care and coordination with other care providers 2. 24/7 contact phone numbers for assistance for urgent and routine care needs. 3. Service will only be billed when office clinical staff spend 20 minutes or more in a month to coordinate care. 4. Only one practitioner may furnish and bill the service in a calendar month. 5. The patient may stop CCM services at any time (effective at the end of the month) by phone call to the office staff.   Patient agreed to services and verbal consent obtained.   Follow up plan:   Carley Perdue [employer] Scheduler

## 2020-12-11 DIAGNOSIS — M79672 Pain in left foot: Secondary | ICD-10-CM | POA: Diagnosis not present

## 2020-12-18 DIAGNOSIS — M79672 Pain in left foot: Secondary | ICD-10-CM | POA: Diagnosis not present

## 2020-12-27 DIAGNOSIS — R339 Retention of urine, unspecified: Secondary | ICD-10-CM | POA: Diagnosis not present

## 2020-12-28 DIAGNOSIS — G35 Multiple sclerosis: Secondary | ICD-10-CM | POA: Diagnosis not present

## 2021-01-16 DIAGNOSIS — M79672 Pain in left foot: Secondary | ICD-10-CM | POA: Diagnosis not present

## 2021-01-24 DIAGNOSIS — R339 Retention of urine, unspecified: Secondary | ICD-10-CM | POA: Diagnosis not present

## 2021-01-25 ENCOUNTER — Telehealth: Payer: Self-pay | Admitting: Pharmacist

## 2021-01-25 DIAGNOSIS — Z79899 Other long term (current) drug therapy: Secondary | ICD-10-CM | POA: Diagnosis not present

## 2021-01-25 DIAGNOSIS — G35 Multiple sclerosis: Secondary | ICD-10-CM | POA: Diagnosis not present

## 2021-01-25 NOTE — Chronic Care Management (AMB) (Signed)
Chronic Care Management Pharmacy Assistant   Name: [name]  MRN: [id number] DOB: 1955-09-17  [name] is an 65 y.o. year old female who presents for her initial CCM visit with the clinical pharmacist.  Reason for Encounter: Chart Prep for initial visit with [name] on 01/30/21.   Conditions to be addressed/monitored: HTN and Multiple sclerosis, Vitamin D Deficiency, GERD, Hypertriglyceridemia  Recent office visits:  08-20-2020 [name] (LPN/ PCP) - Patient was seen for Medicare wellness exam. Duloxetine changed from 30mg  twice a day to 60mg  twice daily, Amoxicillin 875.125 mg tab discontinued, Zoledronic Acid 5mg  discontinued. Patient instructed to follow up in 1 year 08/20/21  08-07-2020 Margaret Ace MD (PCP) - Patient was seen for acute respiratory infection. Amoxicillin 875-125mg  was started. No follow up was noted   Recent consult visits:  09-25-2020  [facility] - Patient was seen for pain in left foot, no medication changes or follow up date provided  08-22-2020 [name] (Dermatology) - Patient was seen for nonscarring hair loss, no medication changes or follow up date provided  Hospital visits:  None in previous 6 months  Fill History:  CEPHALEXIN  500 MG CAPS 12/27/2020 30   DULOXETINE HYDROCHLORIDE  60 MG CPEP 01/11/2021 30   GABAPENTIN  300 MG CAPS 09/27/2020 30   MELOXICAM  15 MG TABS 12/22/2019 90   AMLODIPINE BESYLATE  2.5 MG TABS 10/17/2020 90   Medications: Outpatient Encounter Medications as of 01/25/2021  Medication Sig Note   amLODipine (NORVASC) 2.5 MG tablet Take 1 tablet (2.5 mg total) by mouth daily.    ascorbic acid (VITAMIN C) 1000 MG tablet Take by mouth.    cephALEXin (KEFLEX) 500 MG capsule Take 500 mg by mouth daily.     Cholecalciferol (VITAMIN D-3) 5000 UNITS TABS Take 1 tablet by mouth daily.    CRANBERRY PO Take 2 capsules by mouth daily.    DULoxetine (CYMBALTA) 60 MG capsule Take 60 mg  by mouth 2 (two) times daily.     DULoxetine (CYMBALTA) 60 MG capsule Take 1 capsule by mouth 2 (two) times daily.    esomeprazole (NEXIUM) 20 MG capsule Take 20-40 mg by mouth daily as needed (acid reflux). 04/03/2015: She takes otc version   gabapentin (NEURONTIN) 300 MG capsule Take 300 mg by mouth daily.     meloxicam (MOBIC) 15 MG tablet Take 15 mg by mouth daily.    Meth-Hyo-M Bl-Na Phos-Ph Sal (URIBEL) 118 MG CAPS Take 1 capsule by mouth daily as needed (bladder spasms). Using PRN    natalizumab (TYSABRI) 300 MG/15ML injection Inject into the vein every 30 (thirty) days. 02/02/2018: Q 28 days   Probiotic Product (PROBIOTIC PEARLS) CAPS Take 1 capsule by mouth daily.    sodium chloride 0.9 % infusion Inject into the vein.    No facility-administered encounter medications on file as of 01/25/2021.    Have you seen any other providers since your last visit?  Yes. Patient see her neurologist monthly. She has seen her dermatologist and urologist as well since last visit with [name]. Any changes in your medications or health?  No. Patient has not had any changes in health but she says Celebrex has been added to medications. Any side effects from any medications?  No issues at this time from any medications.  Do you have an symptoms or problems not managed by your medications?  No. Patient feels her medications are all working well.  Any concerns about your  health right now?  No concerns at this time.  Has your provider asked that you check blood pressure, blood sugar, or follow special diet at home?  Patient has been asked to check her blood pressure at home but not to worry about keeping a log. Patient stated it is checked every month at the neurologist for her MS treatment.  Do you get any type of exercise on a regular basis?  Yes. Patient gets plenty of activity at home doing things around the house. Patient is abel to get around fine with no help.  Can you think of a goal you would like  to reach for your health?  Patient would like to maintain her current weight loss. Patient wants to improve her balance and posture more.  Do you have any problems getting your medications?  No issues at this time.  Is there anything that you would like to discuss during the appointment? Patient cannot think of anything at this time.   Please bring medications and supplements to appointment.  Star Rating Drugs:  None  [name]  Clinical Pharmacist Assistant [phone]

## 2021-01-30 ENCOUNTER — Other Ambulatory Visit: Payer: Self-pay

## 2021-01-30 ENCOUNTER — Ambulatory Visit (INDEPENDENT_AMBULATORY_CARE_PROVIDER_SITE_OTHER): Payer: Medicare Other | Admitting: Pharmacist

## 2021-01-30 DIAGNOSIS — K219 Gastro-esophageal reflux disease without esophagitis: Secondary | ICD-10-CM

## 2021-01-30 DIAGNOSIS — I10 Essential (primary) hypertension: Secondary | ICD-10-CM

## 2021-01-30 NOTE — Progress Notes (Signed)
Chronic Care Management Pharmacy Note  01/31/2021 Name:  [name] MRN:  [id number] DOB:  1956-07-27  Summary: BP is at goal < 140/90 per office readings Patient is still having muscle spasms and are bothersome in the daytime  Recommendations/Changes made from today's visit: -Recommend repeat DEXA scan as last scan was 3 years ago -Recommended calcium supplementation in addition to dietary sources for bone support -Recommended tapering esomeprazole to every other day based on no symptoms -Recommended trial of docusate as needed for constipation  Plan: Follow up in 6 months   Subjective: [name] is an 65 y.o. year old female who is a primary patient of [name], [name], MD.  The CCM team was consulted for assistance with disease management and care coordination needs.    Engaged with patient face to face for initial visit in response to provider referral for pharmacy case management and/or care coordination services.   Consent to Services:  The patient was given the following information about Chronic Care Management services today, agreed to services, and gave verbal consent: 1. CCM service includes personalized support from designated clinical staff supervised by the primary care provider, including individualized plan of care and coordination with other care providers 2. 24/7 contact phone numbers for assistance for urgent and routine care needs. 3. Service will only be billed when office clinical staff spend 20 minutes or more in a month to coordinate care. 4. Only one practitioner may furnish and bill the service in a calendar month. 5.The patient may stop CCM services at any time (effective at the end of the month) by phone call to the office staff. 6. The patient will be responsible for cost sharing (co-pay) of up to 20% of the service fee (after annual deductible is met). Patient agreed to services and consent obtained.  Patient Care Team: Burnis Medin,  MD as PCP - Silvestre Moment, MD as Referring Physician (Obstetrics and Gynecology) Ceasar Mons, MD as Consulting Physician (Urology) Sydnee Levans, MD as Consulting Physician (Dermatology) Karl Luke, MD as Referring Physician (Neurology) Viona Gilmore, [facility] as Pharmacist (Pharmacist)  Recent office visits: 08/20/20 Ofilia Neas (LPN/ PCP) - Patient was seen for Medicare wellness exam. Duloxetine changed from 71m twice a day to 627mtwice daily, Amoxicillin 875.125 mg tab was removed, Zoledronic Acid 29m18mas removed. Patient instructed to follow up in 1 year 08/20/21   08/07/20 WanShanon Ace (PCP) - Patient was seen for acute respiratory infection. Amoxicillin 875-1229m27ms started. No follow up was noted  Recent consult visits: 01/25/21 LiriKarl Luke (neurology): Patient presented for MS follow up and Tysabri infusion.  12/28/20 EricCreig Hines (neurology): Patient presented for Tysabri infusion.  11/28/20 LiriKarl Luke (neurology): Patient presented for MS follow up and Tysabri infusion.  10/30/20 LiriKarl Luke (neurology): Patient presented for MS follow up and Tysabri infusion. Prescribed Celebrex to replace meloxicam.  10/02/20 LiriKarl Luke (neurology): Patient presented for MS follow up and Tysabri infusion.  09/25/20  [facility] - Patient was seen for pain in left foot, no medication changes or follow up date provided.  08/30/20 StefApolonio SchneidersN (OBGYN): Patient presented for annual physical.   08/22/20 SusaSydnee Levansrmatology) - Patient was seen for nonscarring hair loss, no medication changes or follow up date provided.  Hospital visits: None in previous 6 months   Objective:  Lab Results  Component Value Date   CREATININE 0.74 06/01/2018   BUN 17 06/01/2018  GFR 83.95 06/01/2018   GFRNONAA >60 04/03/2015   GFRAA >60 04/03/2015   NA 140 06/01/2018   K 4.4 06/01/2018   CALCIUM 9.8 06/01/2018   CO2 30 06/01/2018    GLUCOSE 101 (H) 06/01/2018    Lab Results  Component Value Date/Time   HGBA1C 5.2 02/13/2014 10:41 AM   GFR 83.95 06/01/2018 02:25 PM   GFR 105.29 04/27/2017 01:57 PM    Last diabetic Eye exam: No results found for: HMDIABEYEEXA  Last diabetic Foot exam: No results found for: HMDIABFOOTEX   Lab Results  Component Value Date   CHOL 194 06/01/2018   HDL 64.10 06/01/2018   LDLCALC 107 (H) 06/01/2018   LDLDIRECT 110.0 04/23/2015   TRIG 113.0 06/01/2018   CHOLHDL 3 06/01/2018    Hepatic Function Latest Ref Rng & Units 06/01/2018 04/27/2017 03/04/2017  Total Protein 6.0 - 8.3 g/dL 6.8 6.4 6.4  Albumin 3.5 - 5.2 g/dL 4.7 4.5 4.4  AST 0 - 37 U/L _0 ALT 0 - 35 U/L _1 Alk Phosphatase 39 - 117 U/L 75 67 71  Total Bilirubin 0.2 - 1.2 mg/dL 0.7 0.8 0.9  Bilirubin, Direct 0.0 - 0.3 mg/dL 0.1 0.1 0.2    Lab Results  Component Value Date/Time   TSH 1.01 04/27/2017 01:57 PM   TSH 1.82 03/10/2016 09:11 AM   FREET4 0.76 04/23/2015 03:10 PM   FREET4 0.74 02/13/2014 10:41 AM    CBC Latest Ref Rng & Units 06/01/2018 04/27/2017 03/10/2016  WBC 4.0 - 10.5 K/uL 8.6 8.3 7.0  Hemoglobin 12.0 - 15.0 g/dL 13.2 12.9 12.5  Hematocrit 36.0 - 46.0 % 38.3 37.5 36.1  Platelets 150.0 - 400.0 K/uL 271.0 271.0 228.0    Lab Results  Component Value Date/Time   VD25OH 69.39 06/01/2018 02:25 PM   VD25OH 39 12/13/2008 10:43 PM   VD25OH 19 10/06/2008 12:00 AM    Clinical ASCVD: No  The 10-year ASCVD risk score Mikey Bussing DC Jr., et al., 2013) is: 8.8%   Values used to calculate the score:     Age: 24 years     Sex: Female     Is Non-Hispanic African American: No     Diabetic: No     Tobacco smoker: No     Systolic Blood Pressure: 914 mmHg     Is BP treated: Yes     HDL Cholesterol: 64.1 mg/dL     Total Cholesterol: 194 mg/dL    Depression screen [facility] 2/9 08/20/2020 09/27/2018 06/01/2018  Decreased Interest 0 0 0  Down, Depressed, Hopeless 0 0 0  PHQ - 2 Score 0 0 0      Social History    Tobacco Use  Smoking Status Never  Smokeless Tobacco Never   BP Readings from Last 3 Encounters:  04/20/20 128/82  03/19/20 128/72  05/23/19 (!) 146/90   Pulse Readings from Last 3 Encounters:  04/20/20 87  03/19/20 78  05/23/19 80   Wt Readings from Last 3 Encounters:  04/20/20 153 lb 3.2 oz (69.5 kg)  03/19/20 161 lb (73 kg)  05/23/19 170 lb (77.1 kg)   BMI Readings from Last 3 Encounters:  04/20/20 24.73 kg/m  03/19/20 25.99 kg/m  05/23/19 27.44 kg/m    Assessment/Interventions: Review of patient past medical history, allergies, medications, health status, including review of consultants reports, laboratory and other test data, was performed as part of comprehensive evaluation and provision of chronic care management services.   SDOH:  (Social Determinants  of Health) assessments and interventions performed: Yes SDOH Interventions    Flowsheet Row Most Recent Value  SDOH Interventions   Financial Strain Interventions Intervention Not Indicated  Transportation Interventions Intervention Not Indicated      SDOH Screenings   Alcohol Screen: Low Risk    Last Alcohol Screening Score (AUDIT): 4  Depression (PHQ2-9): Low Risk    PHQ-2 Score: 0  Financial Resource Strain: Low Risk    Difficulty of Paying Living Expenses: Not hard at all  Food Insecurity: No Food Insecurity   Worried About Charity fundraiser in the Last Year: Never true   Ran Out of Food in the Last Year: Never true  Housing: Low Risk    Last Housing Risk Score: 0  Physical Activity: Insufficiently Active   Days of Exercise per Week: 2 days   Minutes of Exercise per Session: 60 min  Social Connections: Engineer, building services of Communication with Friends and Family: More than three times a week   Frequency of Social Gatherings with Friends and Family: More than three times a week   Attends Religious Services: More than 4 times per year   Active Member of [employer] or Organizations: Yes    Attends Music therapist: More than 4 times per year   Marital Status: Married  Stress: No Stress Concern Present   Feeling of Stress : Not at all  Tobacco Use: Low Risk    Smoking Tobacco Use: Never   Smokeless Tobacco Use: Never  Transportation Needs: No Transportation Needs   Lack of Transportation (Medical): No   Lack of Transportation (Non-Medical): No   Patient lives with her husband and is retired. She recently went on a cruise in April across the [place] to [place] and really enjoys traveling.  Her biggest health concerns right now are with her muscle spasms in her back and also her MS. She has both bowel and bladder concerns with MS. She used to take Miralax but reported it was too strong and gave her diarrhea.  Patient doesn't have any questions or concerns with her current medications other than the she is still getting muscle spasms.   For exercise, she does scoliosis exercises 3-4 times a week, instead of every day as she should and this involves some stretching. She also goes to the gym twice a week and works with a Clinical research associate, which will be stopping soon due to cost.   Patient has had some falls but has worked out a system with her husband in order to navigate them. She also uses a cane almost every day to help with her balance.  Patient drinks mostly water and some seltzer without sugar. In the morning she also has a cup of coffee and in the summer drinks iced tea with honey and lemon.  Patient lost 15 lbs with Weight [employer] and wants to keep it off. Patient eats 3 meals a day and tends to eat more fish than other protein, then chicken and less pork and beef. She does eat lots of salads and always has a vegetable with dinner. She does not eat out as her husband is a Architectural technologist and semi retired.   CCM Care Plan  Allergies  Allergen Reactions   Meclizine Hcl     REACTION: Psychotic reaction   Tramadol Itching   Codeine     REACTION: itching    Hydrocodone Itching   Omeprazole Magnesium Hives   Oxycodone Itching   Prilosec [Omeprazole Magnesium] Hives  Tramadol Hcl Itching    Medications Reviewed Today     Reviewed by Viona Gilmore, [facility] (Pharmacist) on 01/30/21 at 1614  Med List Status: <None>   Medication Order Taking? Sig Documenting Provider Last Dose Status Informant  amLODipine (NORVASC) 2.5 MG tablet [id number] Yes Take 1 tablet (2.5 mg total) by mouth daily. [name], [name], MD Taking Active   Ascorbic Acid 500 MG CAPS [id number] Yes Take 500 mg by mouth daily. [provider] Taking Active   celecoxib (CELEBREX) 100 MG capsule [id number] Yes Take 100 mg by mouth 2 (two) times daily as needed. [provider] Taking Active   cephALEXin (KEFLEX) 500 MG capsule [id number] Yes Take 500 mg by mouth daily.  [provider] Taking Active   Cholecalciferol (VITAMIN D-3) 5000 UNITS TABS [id number] Yes Take 1 tablet by mouth daily. [provider] Taking Active Self  CRANBERRY PO [id number] Yes Take 1 capsule by mouth in the morning and at bedtime. [provider] Taking Active   D-MANNOSE PO [id number] Yes Take 1 capsule by mouth in the morning and at bedtime. [provider] Taking Active   DULoxetine (CYMBALTA) 60 MG capsule [id number] Yes Take 1 capsule by mouth 2 (two) times daily. [provider] Taking Active   esomeprazole (NEXIUM) 20 MG capsule [id number] Yes Take 20-40 mg by mouth daily as needed (acid reflux). [provider] Taking Active Self           Med Note Caryn Section, Marylyn Ishihara A   Tue Apr 03, 2015 11:25 AM) She takes otc version  gabapentin (NEURONTIN) 300 MG capsule [id number] Yes Take 300 mg by mouth daily.  [provider] Taking Active   Meth-Hyo-M Bl-Na Phos-Ph Sal (URIBEL) 118 MG CAPS [id number] Yes Take 1 capsule by mouth daily as needed (bladder spasms). Using PRN [provider] Taking Active Self  natalizumab (TYSABRI) 300 MG/15ML  injection [id number] Yes Inject into the vein every 30 (thirty) days. [provider] Taking Active Self           Med Note Luther Hearing, SUSAN   Tue Feb 02, 2018  8:12 AM) Q 28 days  Probiotic Product (PROBIOTIC PEARLS) CAPS [id number] Yes Take 1 capsule by mouth in the morning and at bedtime. [provider] Taking Active Self  sodium chloride 0.9 % infusion [id number]  Inject into the vein. [provider]  Active             Patient Active Problem List   Diagnosis Date Noted   Rectal bleeding 12/05/2016   Internal hemorrhoids 12/05/2016   Hx of falling 03/17/2016   Nausea alone 02/13/2014   Other malaise and fatigue 02/13/2014   Postmenopausal estrogen deficiency 02/24/2013   Visit for preventive health examination 02/24/2013   Family hx osteoporosis 02/21/2013   Recurrent UTI 01/09/2012   Suprapubic tenderness 12/01/2011   GERD (gastroesophageal reflux disease) 10/15/2011   Dysphagia, pharyngoesophageal 10/15/2011   Voiding dysfunction 09/24/2011   Hypertriglyceridemia 02/09/2011   VITAMIN D DEFICIENCY 12/13/2008   Constipation 12/13/2008   MUSCLE SPASM 12/13/2008   DIZZINESS 12/13/2008   HYPOKALEMIA, HX OF 12/13/2008   DISORDER, ADJUSTMENT W/DEPRESSED MOOD 04/30/2007   SCLEROSIS, MULTIPLE 04/30/2007   HYPERTENSION 04/30/2007   VARICOSE VEIN, LWR EXTREMITIES W/INFLAMMATION 04/30/2007   VASOMOTOR RHINITIS 04/30/2007    Immunization History  Administered Date(s) Administered   Influenza Whole 05/07/2010, 06/06/2011   Influenza, Quadrivalent, Recombinant, Inj, Pf 04/21/2018, 05/11/2019, 05/11/2019   Influenza,inj,Quad PF,6+ Mos 05/10/2014, 04/23/2015, 04/23/2017  Influenza-Unspecified 04/24/2017, 05/21/2020   PFIZER(Purple Top)SARS-COV-2 Vaccination 08/17/2019, 09/07/2019, 10/21/2019, 05/21/2020   Tdap 02/05/2011, 07/12/2015   Zoster Recombinat (Shingrix) 05/11/2019   Zoster, Live 05/10/2014    Conditions to be addressed/monitored:   Hypertension, GERD, Osteopenia, Allergic Rhinitis, and MS, Muscle spasms, and UTI prevention  Care Plan : CCM Pharmacy Care Plan  Updates made by Viona Gilmore, [place] since 01/31/2021 12:00 AM     Problem: Problem: Hypertension, GERD, Muscle spasms, Allergic Rhinitis, MS, UTI prevention      Long-Range Goal: Patient-Specific Goal   Start Date: 01/30/2021  Expected End Date: 01/30/2022  This Visit's Progress: On track  Priority: High  Note:   Current Barriers:  Unable to independently monitor therapeutic efficacy Unable to achieve control of muscle spasms   Pharmacist Clinical Goal(s):  Patient will achieve adherence to monitoring guidelines and medication adherence to achieve therapeutic efficacy maintain control of blood pressure as evidenced by home blood pressure readings  through collaboration with PharmD and provider.   Interventions: 1:1 collaboration with [name], [name], MD regarding development and update of comprehensive plan of care as evidenced by provider attestation and co-signature Inter-disciplinary care team collaboration (see longitudinal plan of care) Comprehensive medication review performed; medication list updated in electronic medical record  Hypertension (BP goal <140/90) -Controlled -Current treatment: Amlodipine 2.5 mg 1 tablet daily -Medications previously tried: none  -Current home readings: does not check at home -Current dietary habits: seasons with salt a bit; eats very little processed foods and looks for soups that are low sodium -Current exercise habits: going to the gym twice a week -Denies hypotensive/hypertensive symptoms -Educated on BP goals and benefits of medications for prevention of heart attack, stroke and kidney damage; Importance of home blood pressure monitoring; Proper BP monitoring technique; Symptoms of hypotension and importance of maintaining adequate hydration; -Counseled to monitor BP at home weekly, document, and provide  log at future appointments -Counseled on diet and exercise extensively Recommended to continue current medication Recommended for patient to consider purchasing an arm cuff to replace the wrist cuff.  Osteopenia (Goal prevent fractures) -Uncontrolled -Last DEXA Scan: 04/28/2016 (could not locate scan from 06/22/18)  T-Score femoral neck: -1.2  T-Score total hip: n/a  T-Score lumbar spine: n/a  T-Score forearm radius: n/a  10-year probability of major osteoporotic fracture: 15.5%  10-year probability of hip fracture: 0.6% -Patient is not a candidate for pharmacologic treatment -Current treatment  Vitamin D 5000 units 1 tablet daily -Medications previously tried: Reclast (never started) -Recommend [phone] units of vitamin D daily. Recommend 1200 mg of calcium daily from dietary and supplemental sources. Recommend weight-bearing and muscle strengthening exercises for building and maintaining bone density. -Counseled on diet and exercise extensively Recommended repeat DEXA scan. Recommended supplementation with 600 mg of calcium citrate daily based on calcium intake.   Multiple sclerosis (Goal: minimize progression and symptoms) -Controlled -Current treatment  Tysabri 300 mg/71m inject every 4 weeks -Medications previously tried: Copaxone -Recommended to continue current medication  Foot pain (Goal: minimize pain) -Controlled -Current treatment  Celecoxib 100 mg as needed  -Medications previously tried: meloxicam  -Counseled on using NSAIDs sparingly and trial of Tylenol for pain due to less risk of increasing BP or bleeding while taking NSAIDs  Muscle spasms (Goal: minimize symptoms) -Not ideally controlled -Current treatment  Gabapentin 300 mg 1 capsule at bedtime Duloxetine 60 mg 1 capsule twice daily -Medications previously tried: n/a  -Recommended to continue current medication Recommended for patient to discuss with neurologist about increasing  gabapentin to 100 mg  tablet in the day time due to sleepiness from 300 mg tablet  GERD (Goal: minimize symptoms) -Controlled -Current treatment  Esomeprazole 20 mg 1-2 capsules daily as needed -Medications previously tried: none  -Counseled on long term risks of taking PPIs such as fractures, vitamin B12 deficiency and infections Recommended decreasing to every other day and supplementing with Tums if needed.  UTI prevention (Goal: prevent UTIs) -Controlled -Current treatment  Cranberry 2 capsules daily  Vitamin C 1000 mg 1 tablet daily Probiotic daily Uribel 118 mg 1 capsule daily as needed Cephalexin 500 mg daily -Medications previously tried: none  -Recommended to continue current medication   Health Maintenance -Vaccine gaps: COVID booster, Prevnar 20, shingrix (2nd dose) -Current therapy:  No medications -Educated on Cost vs benefit of each product must be carefully weighed by individual consumer -Patient is satisfied with current therapy and denies issues -Recommended to continue current medication  Patient Goals/Self-Care Activities Patient will:  - take medications as prescribed check blood pressure weekly, document, and provide at future appointments target a minimum of 150 minutes of moderate intensity exercise weekly  Follow Up Plan: Telephone follow up appointment with care management team member scheduled for: 6 months       Medication Assistance: None required.  Patient affirms current coverage meets needs.  Compliance/Adherence/Medication fill history: Care Gaps: Prevnar 20, shingrix, mammogram, COVID booster  Star-Rating Drugs: None  Patient's preferred pharmacy is:  [facility] [id number], [place] [address] Phone: [phone] Fax: [phone]  Uses pill box? Yes - weekly (AM/PM) Pt endorses 95% compliance - 2-3 times a month (out of routine)  We discussed: Current pharmacy is preferred with  insurance plan and patient is satisfied with pharmacy services Patient decided to: Continue current medication management strategy  Care Plan and Follow Up Patient Decision:  Patient agrees to Care Plan and Follow-up.  Plan: Telephone follow up appointment with care management team member scheduled for:  6 months  [name], PharmD, [place] Pharmacist [place] at [place] [phone]

## 2021-01-30 NOTE — Patient Instructions (Addendum)
Hi [name],  It was great to get to meet you in person! Below is a summary of some of the topics we discussed.   Try to find a supplement with calcium citrate 600 mg to take every day Look for Prevnar 20 at the pharmacy You can move amlodipine to the evening if you would like to help remember it   I also think it would be worth while to try taking a stool softener like docusate (colace) on days that you are having constipation because it's easier on your bowels.  Please reach out to me if you have any questions or need anything before our follow up!  Best, Maddie  [name], PharmD, [facility] [place]   Visit Information   Goals Addressed             This Visit's Progress    Track and Manage My Blood Pressure-Hypertension       Timeframe:  Short-Term Goal Priority:  Medium Start Date:                             Expected End Date:                       Follow Up Date 08/03/21    - check blood pressure weekly - choose a place to take my blood pressure (home, clinic or office, retail store) - write blood pressure results in a log or diary    Why is this important?   You won't feel high blood pressure, but it can still hurt your blood vessels.  High blood pressure can cause heart or kidney problems. It can also cause a stroke.  Making lifestyle changes like losing a little weight or eating less salt will help.  Checking your blood pressure at home and at different times of the day can help to control blood pressure.  If the doctor prescribes medicine remember to take it the way the doctor ordered.  Call the office if you cannot afford the medicine or if there are questions about it.     Notes:         Patient Care Plan: CCM Pharmacy Care Plan     Problem Identified: Problem: Hypertension, GERD, Muscle spasms, Allergic Rhinitis, MS, UTI prevention      Long-Range Goal: Patient-Specific Goal   Start Date:  01/30/2021  Expected End Date: 01/30/2022  This Visit's Progress: On track  Priority: High  Note:   Current Barriers:  Unable to independently monitor therapeutic efficacy Unable to achieve control of muscle spasms   Pharmacist Clinical Goal(s):  Patient will achieve adherence to monitoring guidelines and medication adherence to achieve therapeutic efficacy maintain control of blood pressure as evidenced by home blood pressure readings  through collaboration with PharmD and provider.   Interventions: 1:1 collaboration with [name], [name], MD regarding development and update of comprehensive plan of care as evidenced by provider attestation and co-signature Inter-disciplinary care team collaboration (see longitudinal plan of care) Comprehensive medication review performed; medication list updated in electronic medical record  Hypertension (BP goal <140/90) -Controlled -Current treatment: Amlodipine 2.5 mg 1 tablet daily -Medications previously tried: none  -Current home readings: does not check at home -Current dietary habits: seasons with salt a bit; eats very little processed foods and looks for soups that are low sodium -Current exercise habits: going to the gym twice a week -Denies hypotensive/hypertensive  symptoms -Educated on BP goals and benefits of medications for prevention of heart attack, stroke and kidney damage; Importance of home blood pressure monitoring; Proper BP monitoring technique; Symptoms of hypotension and importance of maintaining adequate hydration; -Counseled to monitor BP at home weekly, document, and provide log at future appointments -Counseled on diet and exercise extensively Recommended to continue current medication Recommended for patient to consider purchasing an arm cuff to replace the wrist cuff.  Osteopenia (Goal prevent fractures) -Uncontrolled -Last DEXA Scan: 04/28/2016 (could not locate scan from 06/22/18)  T-Score femoral neck:  -1.2  T-Score total hip: n/a  T-Score lumbar spine: n/a  T-Score forearm radius: n/a  10-year probability of major osteoporotic fracture: 15.5%  10-year probability of hip fracture: 0.6% -Patient is not a candidate for pharmacologic treatment -Current treatment  Vitamin D 5000 units 1 tablet daily -Medications previously tried: Reclast (never started) -Recommend [phone] units of vitamin D daily. Recommend 1200 mg of calcium daily from dietary and supplemental sources. Recommend weight-bearing and muscle strengthening exercises for building and maintaining bone density. -Counseled on diet and exercise extensively Recommended repeat DEXA scan. Recommended supplementation with 600 mg of calcium citrate daily based on calcium intake.   Multiple sclerosis (Goal: minimize progression and symptoms) -Controlled -Current treatment  Tysabri 300 mg/4mL inject every 4 weeks -Medications previously tried: Copaxone -Recommended to continue current medication  Foot pain (Goal: minimize pain) -Controlled -Current treatment  Celecoxib 100 mg as needed  -Medications previously tried: meloxicam  -Counseled on using NSAIDs sparingly and trial of Tylenol for pain due to less risk of increasing BP or bleeding while taking NSAIDs  Muscle spasms (Goal: minimize symptoms) -Not ideally controlled -Current treatment  Gabapentin 300 mg 1 capsule at bedtime Duloxetine 60 mg 1 capsule twice daily -Medications previously tried: n/a  -Recommended to continue current medication Recommended for patient to discuss with neurologist about increasing gabapentin to 100 mg tablet in the day time due to sleepiness from 300 mg tablet  GERD (Goal: minimize symptoms) -Controlled -Current treatment  Esomeprazole 20 mg 1-2 capsules daily as needed -Medications previously tried: none  -Counseled on long term risks of taking PPIs such as fractures, vitamin B12 deficiency and infections Recommended decreasing to every  other day and supplementing with Tums if needed.  UTI prevention (Goal: prevent UTIs) -Controlled -Current treatment  Cranberry 2 capsules daily  Vitamin C 1000 mg 1 tablet daily Probiotic daily Uribel 118 mg 1 capsule daily as needed Cephalexin 500 mg daily -Medications previously tried: none  -Recommended to continue current medication   Health Maintenance -Vaccine gaps: COVID booster, Prevnar 20, shingrix (2nd dose) -Current therapy:  No medications -Educated on Cost vs benefit of each product must be carefully weighed by individual consumer -Patient is satisfied with current therapy and denies issues -Recommended to continue current medication  Patient Goals/Self-Care Activities Patient will:  - take medications as prescribed check blood pressure weekly, document, and provide at future appointments target a minimum of 150 minutes of moderate intensity exercise weekly  Follow Up Plan: Telephone follow up appointment with care management team member scheduled for: 6 months      Ms. Mcclanahan was given information about Chronic Care Management services today including:  CCM service includes personalized support from designated clinical staff supervised by her physician, including individualized plan of care and coordination with other care providers 24/7 contact phone numbers for assistance for urgent and routine care needs. Standard insurance, coinsurance, copays and deductibles apply for chronic care management only during  months in which we provide at least 20 minutes of these services. Most insurances cover these services at 100%, however patients may be responsible for any copay, coinsurance and/or deductible if applicable. This service may help you avoid the need for more expensive face-to-face services. Only one practitioner may furnish and bill the service in a calendar month. The patient may stop CCM services at any time (effective at the end of the month) by phone call  to the office staff.  Patient agreed to services and verbal consent obtained.   Patient verbalizes understanding of instructions provided today and agrees to view in [place].  Telephone follow up appointment with pharmacy team member scheduled for: 6 months  Viona Gilmore, [facility]

## 2021-02-01 DIAGNOSIS — M79672 Pain in left foot: Secondary | ICD-10-CM | POA: Diagnosis not present

## 2021-02-05 ENCOUNTER — Other Ambulatory Visit: Payer: Self-pay | Admitting: Internal Medicine

## 2021-02-15 DIAGNOSIS — M79672 Pain in left foot: Secondary | ICD-10-CM | POA: Diagnosis not present

## 2021-02-22 DIAGNOSIS — M79672 Pain in left foot: Secondary | ICD-10-CM | POA: Diagnosis not present

## 2021-02-26 DIAGNOSIS — G35 Multiple sclerosis: Secondary | ICD-10-CM | POA: Diagnosis not present

## 2021-02-26 DIAGNOSIS — Z79899 Other long term (current) drug therapy: Secondary | ICD-10-CM | POA: Diagnosis not present

## 2021-02-28 DIAGNOSIS — R339 Retention of urine, unspecified: Secondary | ICD-10-CM | POA: Diagnosis not present

## 2021-03-01 DIAGNOSIS — M79672 Pain in left foot: Secondary | ICD-10-CM | POA: Diagnosis not present

## 2021-03-11 DIAGNOSIS — N3020 Other chronic cystitis without hematuria: Secondary | ICD-10-CM | POA: Diagnosis not present

## 2021-03-15 DIAGNOSIS — M79672 Pain in left foot: Secondary | ICD-10-CM | POA: Diagnosis not present

## 2021-03-18 DIAGNOSIS — M79672 Pain in left foot: Secondary | ICD-10-CM | POA: Diagnosis not present

## 2021-03-22 DIAGNOSIS — M79672 Pain in left foot: Secondary | ICD-10-CM | POA: Diagnosis not present

## 2021-03-27 DIAGNOSIS — Z5181 Encounter for therapeutic drug level monitoring: Secondary | ICD-10-CM | POA: Diagnosis not present

## 2021-03-27 DIAGNOSIS — R300 Dysuria: Secondary | ICD-10-CM | POA: Diagnosis not present

## 2021-03-27 DIAGNOSIS — G35 Multiple sclerosis: Secondary | ICD-10-CM | POA: Diagnosis not present

## 2021-03-27 DIAGNOSIS — Z79899 Other long term (current) drug therapy: Secondary | ICD-10-CM | POA: Diagnosis not present

## 2021-04-01 DIAGNOSIS — M79672 Pain in left foot: Secondary | ICD-10-CM | POA: Diagnosis not present

## 2021-04-04 ENCOUNTER — Other Ambulatory Visit: Payer: Self-pay | Admitting: Urology

## 2021-04-05 DIAGNOSIS — M79672 Pain in left foot: Secondary | ICD-10-CM | POA: Diagnosis not present

## 2021-04-05 DIAGNOSIS — R339 Retention of urine, unspecified: Secondary | ICD-10-CM | POA: Diagnosis not present

## 2021-04-08 ENCOUNTER — Encounter (HOSPITAL_BASED_OUTPATIENT_CLINIC_OR_DEPARTMENT_OTHER): Payer: Self-pay | Admitting: Urology

## 2021-04-08 ENCOUNTER — Other Ambulatory Visit: Payer: Self-pay

## 2021-04-08 DIAGNOSIS — N393 Stress incontinence (female) (male): Secondary | ICD-10-CM

## 2021-04-08 DIAGNOSIS — Z789 Other specified health status: Secondary | ICD-10-CM

## 2021-04-08 DIAGNOSIS — R350 Frequency of micturition: Secondary | ICD-10-CM

## 2021-04-08 DIAGNOSIS — Z9989 Dependence on other enabling machines and devices: Secondary | ICD-10-CM

## 2021-04-08 HISTORY — DX: Stress incontinence (female) (male): N39.3

## 2021-04-08 HISTORY — DX: Dependence on other enabling machines and devices: Z99.89

## 2021-04-08 HISTORY — DX: Other specified health status: Z78.9

## 2021-04-08 HISTORY — DX: Frequency of micturition: R35.0

## 2021-04-08 NOTE — Progress Notes (Signed)
Spoke w/ via phone for pre-op interview---pt Lab needs dos----   I stat ekg            Lab results------none COVID test -----patient states asymptomatic no test needed Arrive at -------645 am 04-10-2021 NPO after MN NO Solid Food.  Clear liquids from MN until---545 am Med rec completed Medications to take morning of surgery -----amlodipine, nexium, cymbalta Diabetic medication -----n/a Patient instructed no nail polish to be worn day of surgery Patient instructed to bring photo id and insurance card day of surgery Patient aware to have Driver (ride ) / caregiver    spouse george for 24 hours after surgery  Patient Special Instructions -----none Pre-Op special Istructions -----none Patient verbalized understanding of instructions that were given at this phone interview. Patient denies shortness of breath, chest pain, fever, cough at this phone interview.

## 2021-04-10 ENCOUNTER — Ambulatory Visit (HOSPITAL_BASED_OUTPATIENT_CLINIC_OR_DEPARTMENT_OTHER): Payer: Medicare Other | Admitting: Anesthesiology

## 2021-04-10 ENCOUNTER — Ambulatory Visit (HOSPITAL_BASED_OUTPATIENT_CLINIC_OR_DEPARTMENT_OTHER)
Admission: RE | Admit: 2021-04-10 | Discharge: 2021-04-10 | Disposition: A | Discharge status: Home / Self Care | Payer: Medicare Other | Source: Ambulatory Visit | Attending: Urology | Admitting: Urology

## 2021-04-10 ENCOUNTER — Encounter (HOSPITAL_BASED_OUTPATIENT_CLINIC_OR_DEPARTMENT_OTHER): Payer: Self-pay | Admitting: Urology

## 2021-04-10 ENCOUNTER — Encounter (HOSPITAL_BASED_OUTPATIENT_CLINIC_OR_DEPARTMENT_OTHER)
Admission: RE | Disposition: A | Discharge status: Home / Self Care | Payer: Self-pay | Source: Ambulatory Visit | Attending: Urology

## 2021-04-10 DIAGNOSIS — N3289 Other specified disorders of bladder: Secondary | ICD-10-CM | POA: Diagnosis not present

## 2021-04-10 DIAGNOSIS — Z79899 Other long term (current) drug therapy: Secondary | ICD-10-CM | POA: Diagnosis not present

## 2021-04-10 DIAGNOSIS — N3941 Urge incontinence: Secondary | ICD-10-CM | POA: Diagnosis not present

## 2021-04-10 DIAGNOSIS — N3020 Other chronic cystitis without hematuria: Secondary | ICD-10-CM | POA: Diagnosis not present

## 2021-04-10 DIAGNOSIS — N312 Flaccid neuropathic bladder, not elsewhere classified: Secondary | ICD-10-CM | POA: Diagnosis not present

## 2021-04-10 DIAGNOSIS — Z791 Long term (current) use of non-steroidal anti-inflammatories (NSAID): Secondary | ICD-10-CM | POA: Insufficient documentation

## 2021-04-10 DIAGNOSIS — E559 Vitamin D deficiency, unspecified: Secondary | ICD-10-CM | POA: Diagnosis not present

## 2021-04-10 DIAGNOSIS — Z87440 Personal history of urinary (tract) infections: Secondary | ICD-10-CM | POA: Insufficient documentation

## 2021-04-10 DIAGNOSIS — Z888 Allergy status to other drugs, medicaments and biological substances status: Secondary | ICD-10-CM | POA: Diagnosis not present

## 2021-04-10 DIAGNOSIS — K219 Gastro-esophageal reflux disease without esophagitis: Secondary | ICD-10-CM | POA: Diagnosis not present

## 2021-04-10 DIAGNOSIS — G35 Multiple sclerosis: Secondary | ICD-10-CM | POA: Insufficient documentation

## 2021-04-10 DIAGNOSIS — Z885 Allergy status to narcotic agent status: Secondary | ICD-10-CM | POA: Diagnosis not present

## 2021-04-10 HISTORY — DX: Essential (primary) hypertension: I10

## 2021-04-10 HISTORY — PX: BOTOX INJECTION: SHX5754

## 2021-04-10 LAB — POCT I-STAT, CHEM 8
BUN: 11 mg/dL (ref 8–23)
Calcium, Ion: 1.23 mmol/L (ref 1.15–1.40)
Chloride: 103 mmol/L (ref 98–111)
Creatinine, Ser: 0.5 mg/dL (ref 0.44–1.00)
Glucose, Bld: 100 mg/dL — ABNORMAL HIGH (ref 70–99)
HCT: 36 % (ref 36.0–46.0)
Hemoglobin: 12.2 g/dL (ref 12.0–15.0)
Potassium: 4 mmol/L (ref 3.5–5.1)
Sodium: 141 mmol/L (ref 135–145)
TCO2: 27 mmol/L (ref 22–32)

## 2021-04-10 SURGERY — BOTOX INJECTION
Anesthesia: Monitor Anesthesia Care

## 2021-04-10 MED ORDER — STERILE WATER FOR IRRIGATION IR SOLN
Status: DC | PRN
  Administered 2021-04-10: 3000 mL

## 2021-04-10 MED ORDER — SODIUM CHLORIDE (PF) 0.9 % IJ SOLN
INTRAMUSCULAR | Status: DC | PRN
  Administered 2021-04-10: 20 mL

## 2021-04-10 MED ORDER — CEFAZOLIN SODIUM-DEXTROSE 2-4 GM/100ML-% IV SOLN
INTRAVENOUS | Status: AC
  Filled 2021-04-10: qty 100

## 2021-04-10 MED ORDER — PROPOFOL 10 MG/ML IV BOLUS
INTRAVENOUS | Status: DC | PRN
Start: 2021-04-10 — End: 2021-04-10
  Administered 2021-04-10: 30 mg via INTRAVENOUS

## 2021-04-10 MED ORDER — ONABOTULINUMTOXINA 100 UNITS IJ SOLR
INTRAMUSCULAR | Status: DC | PRN
  Administered 2021-04-10: 300 [IU] via INTRAMUSCULAR

## 2021-04-10 MED ORDER — FENTANYL CITRATE (PF) 100 MCG/2ML IJ SOLN
INTRAMUSCULAR | Status: AC
  Filled 2021-04-10: qty 2

## 2021-04-10 MED ORDER — LACTATED RINGERS IV SOLN: INTRAVENOUS | Status: DC

## 2021-04-10 MED ORDER — MIDAZOLAM HCL 2 MG/2ML IJ SOLN
INTRAMUSCULAR | Status: AC
  Filled 2021-04-10: qty 2

## 2021-04-10 MED ORDER — PROPOFOL 500 MG/50ML IV EMUL
INTRAVENOUS | Status: DC | PRN
  Administered 2021-04-10: 100 ug/kg/min via INTRAVENOUS

## 2021-04-10 MED ORDER — PROPOFOL 10 MG/ML IV BOLUS
INTRAVENOUS | Status: AC
  Filled 2021-04-10: qty 40

## 2021-04-10 MED ORDER — FENTANYL CITRATE (PF) 100 MCG/2ML IJ SOLN
INTRAMUSCULAR | Status: DC | PRN
  Administered 2021-04-10: 25 ug via INTRAVENOUS

## 2021-04-10 MED ORDER — LIDOCAINE HCL (PF) 2 % IJ SOLN
INTRAMUSCULAR | Status: AC
  Filled 2021-04-10: qty 5

## 2021-04-10 MED ORDER — MIDAZOLAM HCL 2 MG/2ML IJ SOLN
INTRAMUSCULAR | Status: DC | PRN
  Administered 2021-04-10 (×2): .5 mg via INTRAVENOUS
  Administered 2021-04-10: 1 mg via INTRAVENOUS

## 2021-04-10 MED ORDER — CEFAZOLIN SODIUM-DEXTROSE 2-4 GM/100ML-% IV SOLN
2.0000 g | Freq: Once | INTRAVENOUS | Status: AC
  Administered 2021-04-10: 2 g via INTRAVENOUS

## 2021-04-10 SURGICAL SUPPLY — 17 items
BAG DRAIN URO-CYSTO SKYTR STRL (DRAIN) ×2 IMPLANT
CATH ROBINSON RED A/P 14FR (CATHETERS) IMPLANT
CLOTH BEACON ORANGE TIMEOUT ST (SAFETY) ×2 IMPLANT
ELECT REM PT RETURN 9FT ADLT (ELECTROSURGICAL) ×2
ELECTRODE REM PT RTRN 9FT ADLT (ELECTROSURGICAL) ×1 IMPLANT
GLOVE SURG ENC MOIS LTX SZ7.5 (GLOVE) ×2 IMPLANT
GOWN STRL REUS W/TWL LRG LVL3 (GOWN DISPOSABLE) ×6 IMPLANT
KIT TURNOVER CYSTO (KITS) ×2 IMPLANT
MANIFOLD NEPTUNE II (INSTRUMENTS) ×2 IMPLANT
NDL SAFETY ECLIPSE 18X1.5 (NEEDLE) IMPLANT
NEEDLE ASPIRATION 22 (NEEDLE) IMPLANT
NEEDLE HYPO 18GX1.5 SHARP (NEEDLE)
PACK CYSTO (CUSTOM PROCEDURE TRAY) ×2 IMPLANT
SYR 20ML LL LF (SYRINGE) IMPLANT
SYR CONTROL 10ML LL (SYRINGE) IMPLANT
TUBE CONNECTING 12X1/4 (SUCTIONS) ×2 IMPLANT
WATER STERILE IRR 3000ML UROMA (IV SOLUTION) ×2 IMPLANT

## 2021-04-10 NOTE — Anesthesia Preprocedure Evaluation (Signed)
Anesthesia Evaluation  Patient identified by MRN, date of birth, ID band Patient awake    Reviewed: Allergy & Precautions, NPO status , Patient's Chart, lab work & pertinent test results  Airway Mallampati: II  TM Distance: >3 FB Neck ROM: Full    Dental  (+) Teeth Intact   Pulmonary neg pulmonary ROS,    Pulmonary exam normal        Cardiovascular hypertension, Pt. on medications  Rhythm:Regular Rate:Normal     Neuro/Psych Depression MS    GI/Hepatic Neg liver ROS, GERD  Medicated,  Endo/Other  negative endocrine ROS  Renal/GU negative Renal ROS  negative genitourinary   Musculoskeletal negative musculoskeletal ROS (+)   Abdominal (+)  Abdomen: soft. Bowel sounds: normal.  Peds  Hematology negative hematology ROS (+)   Anesthesia Other Findings   Reproductive/Obstetrics                             Anesthesia Physical Anesthesia Plan  ASA: 3  Anesthesia Plan: MAC   Post-op Pain Management:    Induction: Intravenous  PONV Risk Score and Plan: 2 and Propofol infusion, Treatment may vary due to age or medical condition, Ondansetron and Dexamethasone  Airway Management Planned: Natural Airway, Simple Face Mask and Nasal Cannula  Additional Equipment: None  Intra-op Plan:   Post-operative Plan:   Informed Consent: I have reviewed the patients History and Physical, chart, labs and discussed the procedure including the risks, benefits and alternatives for the proposed anesthesia with the patient or authorized representative who has indicated his/her understanding and acceptance.     Dental advisory given  Plan Discussed with: CRNA  Anesthesia Plan Comments:         Anesthesia Quick Evaluation

## 2021-04-10 NOTE — Transfer of Care (Signed)
Immediate Anesthesia Transfer of Care Note  Patient: [name]  Procedure(s) Performed: Procedure(s) (LRB): BOTOX INJECTION WITH CYSTOSCOPY (N/A)  Patient Location: Phase 2 PACU  Anesthesia Type: MAC  Level of Consciousness: awake, alert , oriented and patient cooperative  Airway & Oxygen Therapy: Patient Spontanous Breathing on room air  Post-op Assessment: Report given to PACU RN and Post -op Vital signs reviewed and stable  Post vital signs: Reviewed and stable  Complications: No apparent anesthesia complications Vitals Value Taken Time  BP    Temp    Pulse    Resp    SpO2      Last Pain:  Vitals:   04/10/21 0720  TempSrc: Oral  PainSc: 0-No pain      Patients Stated Pain Goal: 4 (45/91/36 [id number])  Complications: No notable events documented.

## 2021-04-10 NOTE — Op Note (Signed)
Operative Note  Preoperative diagnosis:  1.  Refractory urge incontinence 2.  Multiple sclerosis  Postoperative diagnosis: 1.  Refractory urge incontinence 2.  Multiple sclerosis  Procedure(s): 1.  Cystoscopy with intravesical Botox injections (300 units)  Surgeon: Ellison Hughs, MD  Assistants:  None  Anesthesia: MAC  Complications:  None  EBL: Less than 5 mL  Specimens: 1.  None  Drains/Catheters: 1.  None  Intraoperative findings:   Moderate bladder trabeculation  Indication:  [name] is a 65 y.o. female with a history of multiple sclerosis with refractory urge incontinence despite multiple oral OAB medications.  She performs clean intermittent catheterization periodically throughout the day at baseline.  She has been consented for the above procedures, voices understanding and wishes to proceed.  Description of procedure:  After informed consent was obtained, the patient was brought to the operating room and general MAC anesthesia was administered. The patient was then placed in the dorsolithotomy position and prepped and draped in the usual sterile fashion. A timeout was performed. A 23 French rigid cystoscope was then inserted into the urethral meatus and advanced into the bladder under direct vision. A complete bladder survey revealed no intravesical pathology.  A total of 300 units of Botox diluted in 20 mL of saline was then injected and 0.5 mL aliquots in a grid like fashion throughout the detrusor musculature.  There was no evidence of bleeding following multiple injections.  The patient's bladder was drained.  She tolerated the procedure well and was transferred to the postanesthesia in stable condition.  Plan: Discharge home.  Resume CIC every 4-5 hours.

## 2021-04-10 NOTE — H&P (Signed)
PRE-OP H&P  Office Visit Report     03/11/2021   --------------------------------------------------------------------------------   [name]  MRN: [id number]  DOB: 1955/10/09, 65 year old Female   PRIMARY CARE:  Shanon Ace, MD  REFERRING:  Shanon Ace, MD  PROVIDER:  Ellison Hughs, M.D.  LOCATION:  [facility] [phone]     --------------------------------------------------------------------------------   CC/HPI: CC: Bladder infections   HPI: [name] is a 65 year old female with a history multiple sclerosis incontinence w/o sensation and recurrent urinary tract infections. She performs self-catheterization BID/TID when she feels suprapubic fullness/discomfort.   04/13/19: Patient with above noted history. She presents today with concerns for UTI. She states that currently she is having increased issues with her MS and is going for further testing this week. She states that for the past 1-2 weeks she has been having increased incontinence between spontaneous voids and CIC. She continues to perform CIC x 2-3 times daily. She denies difficulties performing CIC, but did not some discomfort earlier this week. She denies gross hematuria or fevers. She has been using Uribel, which does help. She states that she was treated about 6 weeks ago for an episode of cystitis, but it has been approximately one year prior to this event. She remains on keflex, cranberry tablets, D-mannose, probiotics and Vit C for UTI prevention.   02/29/20: The patient his here today for a f/u after developing dysuria and a suspected UTI. She states that her urinary sxs cleared after a course of keflex. She had a urine culture performed on 7/28 that showed no growth. UA today is clear. She continues to perform CIC w/o difficulty. Of note, she is frustrated with the lack of communication between the triage and nursing staff concerning her recent issues.   10/31/2020: Patient with above  noted past medical history. She is currently not having any issues but is going on a cruise and wanted to ensure she was not having any infections. She also wanted something for urgency and frequency if needed. She currently denies any increased urgency, frequency, dysuria. Her urinalysis is not overly concerning for an infection today. She recently picked up her cephalexin which she takes as a suppressive antibiotic. Marland Kitchen She is going on a cruise to [place] and is very excited about this.   03/11/21: [name] for a routine follow-up. Diarrhea has worsened over the pat 6 months--currently on daily keflex. She was prescribed oxybutynin at her last visit, but admits that she has not started the medication out of concern for dry eye side effects. She reports that she is completely incontinent and wears 3-5 pads per day. She continues to perform CIC 3 times daily w/o any discomfort.     ALLERGIES: Codeine Derivatives - Itching Meclizine HCl TABS - Other Reaction, hallucination Omeprazole TBEC - Hives Tramadol - Itching, Hives    MEDICATIONS: Oxybutynin Chloride 5 mg tablet 1 tablet PO BID PRN  Amlodipine Besylate 2.5 mg tablet  Biotin  Celebrex  Cephalexin 500 mg capsule 1 capsule PO Daily  Cranberry  Cymbalta 30 mg capsule,delayed release  Florajen Acidophilus 20 billion cell capsule 1 capsule PO Daily  Gabapentin 600 mg tablet  NexIUM PACK Oral  Tysabri 300 MG/15ML Intravenous Concentrate Intravenous  Uribel 118 mg-10 mg-40.8 mg-36 mg-0.12 mg capsule 1 capsule PO Q 8 H PRN Take as needed for painful urination  Vitamin B12  Vitamin C  Vitamin D3 5000 UNIT Oral Tablet Oral     GU  PSH: Hysterectomy Unilat SO - 2013       [place] Notes: Bladder Surgery   NON-GU PSH: Rotator Cuff Surgery.. Shoulder Surgery (Unspecified)     GU PMH: Areflexic bladder - 10/31/2020, - 2019, - 2017, Hypotonic bladder, - 2016 Acute Cystitis/UTI - 2019 Chronic cystitis (w/o hematuria) - 2019, -  2017, Chronic cystitis, - 2015 Dysuria, Dysuria - 2016 Incontinence w/o Sensation, Urinary incontinence without sensory awareness - 2016 Pelvic/perineal pain, Abdominal pain, suprapubic - 2015 Urinary Tract Inf, Unspec site, Pyuria - 2015, Urinary tract infection, - 2014    NON-GU PMH: Multiple sclerosis, Multiple sclerosis - 2016 Encounter for general adult medical examination without abnormal findings, Encounter for preventive health examination - 2015 Anxiety, Anxiety (Symptom) - 2014 Personal history of other mental and behavioral disorders, History of depression - 2014 Personal history of other specified conditions, History of heartburn - 2014 Migraine, unspecified, not intractable, without status migrainosus    FAMILY HISTORY: Death In The Family Father - Father Family Health Status - Mother's Age - Mother Family Health Status Number - Runs In Family Lung Cancer - Father   SOCIAL HISTORY: Marital Status: Married Preferred Language: English; Ethnicity: Not Hispanic Or Latino; Race: White Current Smoking Status: Patient has never smoked.  Does not use smokeless tobacco. Drinks 2 drinks per day.  Does not use drugs. Drinks 2 caffeinated drinks per day. Patient's occupation is/was Retired.    REVIEW OF SYSTEMS:    GU Review Female:   Patient reports frequent urination, hard to postpone urination, get up at night to urinate, leakage of urine, and stream starts and stops. Patient denies burning /pain with urination, trouble starting your stream, have to strain to urinate, and being pregnant.  Gastrointestinal (Upper):   Patient denies nausea, vomiting, and indigestion/ heartburn.  Gastrointestinal (Lower):   Patient reports diarrhea. Patient denies constipation.  Constitutional:   Patient denies fever, night sweats, weight loss, and fatigue.  Skin:   Patient denies skin rash/ lesion and itching.  Eyes:   Patient denies blurred vision and double vision.  Ears/ Nose/ Throat:    Patient denies sore throat and sinus problems.  Hematologic/Lymphatic:   Patient denies swollen glands and easy bruising.  Cardiovascular:   Patient denies leg swelling and chest pains.  Respiratory:   Patient denies cough and shortness of breath.  Endocrine:   Patient denies excessive thirst.  Musculoskeletal:   Patient denies back pain and joint pain.  Neurological:   Patient denies headaches and dizziness.  Psychologic:   Patient denies depression and anxiety.   VITAL SIGNS:      03/11/2021 11:53 AM  BP 145/79 mmHg  Heart Rate 77 /min  Temperature 98.1 F / 36.7 C   MULTI-SYSTEM PHYSICAL EXAMINATION:    Constitutional: Well-nourished. No physical deformities. Normally developed. Good grooming.  Neurologic / Psychiatric: Oriented to time, oriented to place, oriented to person. No depression, no anxiety, no agitation.  Musculoskeletal: Normal gait and station of head and neck.     Complexity of Data:  Records Review:   Previous Patient Records   PROCEDURES:         Renal Ultrasound - [id number]  Right Kidney: Length: 10.4 cm Depth: 5.5 cm Cortical Width: 1.3 cm Width: 3.6 cm  Left Kidney: Length: 10.9 cm Depth: 5.1 cm Cortical Width: 1.5 cm Width: 5.5 cm  Left Kidney/Ureter:  Within Normal Limits   Right Kidney/Ureter:  Within Normal Limits   Bladder:  PVR 45.5 ml      Patient  confirmed No Neulasta OnPro Device.   No hydronephrosis seen, bilaterally. No stone shadowing or solid parenchymal lesions observed, bilaterally. There is normal renal echogenecity bilaterally. The bladder lumen has a smooth contour with no masses or debris.          Urinalysis w/Scope Dipstick Dipstick Cont'd Micro  Color: Yellow Bilirubin: Neg mg/dL WBC/hpf: NS (Not Seen)  Appearance: Clear Ketones: Neg mg/dL RBC/hpf: NS (Not Seen)  Specific Gravity: 1.010 Blood: Neg ery/uL Bacteria: Rare (0-9/hpf)  pH: 5.5 Protein: Neg mg/dL Cystals: NS (Not Seen)  Glucose: Neg mg/dL Urobilinogen: 0.2 mg/dL Casts:  NS (Not Seen)    Nitrites: Neg Trichomonas: Not Present    Leukocyte Esterase: Trace leu/uL Mucous: Not Present      Epithelial Cells: 0 - 5/hpf      Yeast: NS (Not Seen)      Sperm: Not Present    Notes: QNS for spun micro    ASSESSMENT:      ICD-10 Details  1 GU:   Chronic cystitis (w/o hematuria) - N30.20   2   Areflexic bladder - N31.2 Chronic, Stable  4   Incontinence w/o Sensation - N39.42 Chronic, Worsening  3 NON-GU:   Multiple sclerosis - G35 Chronic, Stable   PLAN:           Orders Labs Urine Culture          Schedule X-Rays: 1 Year - Renal Ultrasound  Return Visit/Planned Activity: 1 Year - Office Visit, Follow up MD  Return Visit/Planned Activity: Next Available Appointment - Schedule Surgery          Document Letter(s):  Created for Patient: Clinical Summary         Notes:   -Start keflex PRN BID for 5 days--only with UTI sxs  -Continue CIC q 4-5 hours  -The risks, benefits and alternatives to cystoscopy with intravesical botox injections was discussed with the patient. She voices understanding and wishes to proceed.  -f/u in 12 months w/ RUS

## 2021-04-10 NOTE — Anesthesia Postprocedure Evaluation (Signed)
Anesthesia Post Note  Patient: [name]  Procedure(s) Performed: BOTOX INJECTION WITH CYSTOSCOPY     Patient location during evaluation: PACU Anesthesia Type: MAC Level of consciousness: awake and alert Pain management: pain level controlled Vital Signs Assessment: post-procedure vital signs reviewed and stable Respiratory status: spontaneous breathing, nonlabored ventilation, respiratory function stable and patient connected to nasal cannula oxygen Cardiovascular status: stable and blood pressure returned to baseline Postop Assessment: no apparent nausea or vomiting Anesthetic complications: no   No notable events documented.  Last Vitals:  Vitals:   04/10/21 0945 04/10/21 1000  BP: 134/77 (!) 141/69  Pulse: 68 65  Resp: 16 18  Temp:    SpO2: 99% 99%    Last Pain:  Vitals:   04/10/21 1000  TempSrc:   PainSc: 0-No pain                 Belenda Cruise P Nelvin Tomb

## 2021-04-10 NOTE — Discharge Instructions (Signed)

## 2021-04-11 ENCOUNTER — Encounter (HOSPITAL_BASED_OUTPATIENT_CLINIC_OR_DEPARTMENT_OTHER): Payer: Self-pay | Admitting: Urology

## 2021-04-12 DIAGNOSIS — M79672 Pain in left foot: Secondary | ICD-10-CM | POA: Diagnosis not present

## 2021-04-23 NOTE — Progress Notes (Signed)
Chief Complaint  Patient presents with   Annual Exam     HPI: Patient  [name]  65 y.o. comes in today for [place] visit   Sees neurology for  ms  fairly stable   slow  change  gets around with cane or walker  at home   independent .  Avoiding falls .  UI:   much better  after botox rx feels   much better quality of life. Still caths qid but now has control  Amlodipine   refill for BP :usually below 140 denies sig se .  Ms sometimes cough and tight upper chest  with cough n osob  ha resolving chest  cold from [facility]  Health Maintenance  Topic Date Due   Zoster Vaccines- Shingrix (2 of 2) 07/06/2019   MAMMOGRAM  02/03/2020   COVID-19 Vaccine (5 - Booster for Pfizer series) 09/21/2020   INFLUENZA VACCINE  02/25/2021   COLONOSCOPY (Pts 45-74yrs Insurance coverage will need to be confirmed)  01/09/2025   TETANUS/TDAP  07/11/2025   DEXA SCAN  Completed   HPV VACCINES  Aged Out   Hepatitis C Screening  Discontinued   Health Maintenance Review LIFESTYLE:  Exercise:  walk with cain or walker  depending.   Fall uneven terrains.  Tobacco/ETS: n Alcohol: wine night  Sugar beverages: not a lot  Sleep: 9 - 10  Drug use: no HH of  2+3 son separated    2 cat.  On disability   MS slowly worse.  ROS:  REST of 12 system review negative except as per HPI no cv  sx   Past Medical History:  Diagnosis Date   Ambulates with cane 04/08/2021   prn   Concussion 2019   no residual   Depressive reaction    Diverticulosis    Frequent UTI    last uti 1 year ago per pt on 04-08-2021   Gastropathy 07/29/2011   reactive   GERD (gastroesophageal reflux disease)    Hypertension    Mood disorder ([place])    MS (multiple sclerosis) ([place]) 2007   Self-catheterizes urinary bladder 04/08/2021   self caths 2 to 3 x daily   SUI (stress urinary incontinence, female) 04/08/2021   wears depends   Urinary frequency 04/08/2021   Uses walker 04/08/2021   prn    Past Surgical  History:  Procedure Laterality Date   ABDOMINAL HYSTERECTOMY  2008   partial   BLADDER SURGERY  2008   Tack   BOTOX INJECTION N/A 04/10/2021   Procedure: BOTOX INJECTION WITH CYSTOSCOPY;  Surgeon: Ceasar Mons, MD;  Location[facility];  Service: Urology;  Laterality: N/A;   SHOULDER ARTHROSCOPY WITH ROTATOR CUFF REPAIR AND SUBACROMIAL DECOMPRESSION  06/01/2012   Procedure: SHOULDER ARTHROSCOPY WITH ROTATOR CUFF REPAIR AND SUBACROMIAL DECOMPRESSION;  Surgeon: Magnus Sinning, MD;  Location: [facility] [place];  Service: Orthopedics;  Laterality: Left;  LEFT SHOULDER ARTHROSCOPY WITH SUBACROMIAL DECOMPRESION AND DEBRIDEMENT OF PARTIAL ROTATOR CUFF TEAR   TONSILLECTOMY AND ADENOIDECTOMY  1962    Family History  Problem Relation Age of Onset   Lung cancer Father    Colon cancer Neg Hx     Social History   Socioeconomic History   Marital status: Married    Spouse name: Not on file   Number of children: 4   Years of education: Not on file   Highest education level: Not on file  Occupational History   Occupation: Disability  Tobacco Use   Smoking status: Never   Smokeless tobacco: Never  Vaping Use   Vaping Use: Never used  Substance and Sexual Activity   Alcohol use: Yes    Comment: 2 glasses wine per day   Drug use: No   Sexual activity: Not on file  Other Topics Concern   Not on file  Social History Narrative   Unable to volunteer    Now on disability   Children now out of home   Daily caffeine    Social Determinants of Health   Financial Resource Strain: Low Risk    Difficulty of Paying Living Expenses: Not hard at all  Food Insecurity: No Food Insecurity   Worried About Charity fundraiser in the Last Year: Never true   Ran Out of Food in the Last Year: Never true  Transportation Needs: No Transportation Needs   Lack of Transportation (Medical): No   Lack of Transportation (Non-Medical): No  Physical Activity:  Insufficiently Active   Days of Exercise per Week: 2 days   Minutes of Exercise per Session: 60 min  Stress: No Stress Concern Present   Feeling of Stress : Not at all  Social Connections: Socially Integrated   Frequency of Communication with Friends and Family: More than three times a week   Frequency of Social Gatherings with Friends and Family: More than three times a week   Attends Religious Services: More than 4 times per year   Active Member of [employer] or Organizations: Yes   Attends Music therapist: More than 4 times per year   Marital Status: Married    Outpatient Medications Prior to Visit  Medication Sig Dispense Refill   amLODipine (NORVASC) 2.5 MG tablet TAKE ONE TABLET BY MOUTH DAILY 90 tablet 0   Ascorbic Acid 500 MG CAPS Take 500 mg by mouth daily.     celecoxib (CELEBREX) 100 MG capsule Take 100 mg by mouth 2 (two) times daily as needed.     cephALEXin (KEFLEX) 500 MG capsule Take 500 mg by mouth daily.      Cholecalciferol (VITAMIN D-3) 5000 UNITS TABS Take 1 tablet by mouth daily.     CRANBERRY PO Take 1 capsule by mouth in the morning and at bedtime.     D-MANNOSE PO Take 1 capsule by mouth in the morning and at bedtime.     DULoxetine (CYMBALTA) 60 MG capsule Take 1 capsule by mouth 2 (two) times daily.     esomeprazole (NEXIUM) 20 MG capsule Take 20-40 mg by mouth daily.     gabapentin (NEURONTIN) 300 MG capsule Take 300 mg by mouth at bedtime.     Meth-Hyo-M Bl-Na Phos-Ph Sal (URIBEL) 118 MG CAPS Take 1 capsule by mouth daily as needed (bladder spasms). Using PRN     natalizumab (TYSABRI) 300 MG/15ML injection Inject into the vein every 30 (thirty) days.     Probiotic Product (PROBIOTIC PEARLS) CAPS Take 1 capsule by mouth in the morning and at bedtime.     No facility-administered medications prior to visit.     EXAM:  BP (!) 150/80 (BP Location: Left Arm, Patient Position: Sitting, Cuff Size: Normal)   Pulse 74   Temp 97.9 F (36.6 C) (Oral)    Ht 5\' 6"  (1.676 m)   Wt 152 lb 12.8 oz (69.3 kg)   SpO2 96%   BMI 24.66 kg/m   Body mass index is 24.66 kg/m. Wt Readings from Last 3 Encounters:  04/24/21 152  lb 12.8 oz (69.3 kg)  04/10/21 152 lb 14.4 oz (69.4 kg)  04/20/20 153 lb 3.2 oz (69.5 kg)    Physical Exam: Vital signs reviewed ZOX:WRUE is a well-developed well-nourished alert cooperative    who appearsr stated age in no acute distress.  HEENT: normocephalic atraumatic , Eyes: PERRL EOM's ok  conjunctiva clear, Nares: paten,t no deformity discharge or tenderness., Ears: no deformity EAC's clear TMs with normal landmarks. Mouth:masked  NECK: supple without masses, thyromegaly or bruits. CHEST/PULM:  Clear to auscultation and percussion breath sounds equal no wheeze , rales or rhonchi. No chest wall deformities or tenderness. Breast: normal by inspection . No dimpling, discharge, masses, tenderness or discharge . CV: PMI is nondisplaced, S1 S2 no gallops, murmurs, rubs. Peripheral pulses are full without delay.No JVD .  ABDOMEN: Bowel sounds normal nontender  No guard or rebound, no hepato splenomegal no CVA tenderness.   Extremtities:  No clubbing cyanosis or edema, no acute joint swelling or redness no focal atrophy  tight achilles  dec dorsiflexion. NEURO:  Oriented x3, cranial nerves 3-12 appear to be intact, gait  unsteady careful rigid dec dorsiflexion SKIN: No acute rashes normal turgor, color, no bruising or petechiae. PSYCH: Oriented, good eye contact, no obvious depression anxiety, cognition and judgment appear normal. LN: no cervical axillary inguinal adenopathy  Lab Results  Component Value Date   WBC 8.6 04/24/2021   HGB 13.5 04/24/2021   HCT 39.4 04/24/2021   PLT 291.0 04/24/2021   GLUCOSE 100 (H) 04/10/2021   CHOL 172 04/24/2021   TRIG 112.0 04/24/2021   HDL 55.80 04/24/2021   LDLDIRECT 110.0 04/23/2015   LDLCALC 94 04/24/2021   ALT 27 06/01/2018   AST 20 06/01/2018   NA 141 04/10/2021   K 4.0  04/10/2021   CL 103 04/10/2021   CREATININE 0.50 04/10/2021   BUN 11 04/10/2021   CO2 30 06/01/2018   TSH 1.12 04/24/2021   HGBA1C 5.2 02/13/2014    BP Readings from Last 3 Encounters:  04/24/21 (!) 150/80  04/10/21 (!) 141/69  04/20/20 128/82    Lab plan  reviewed with patient   ASSESSMENT AND PLAN:  Discussed the following assessment and plan:    ICD-10-CM   1. Visit for preventive health examination  Z00.00     2. Essential hypertension  I10 Lipid panel    CBC with Differential/Platelet    TSH    VITAMIN D 25 Hydroxy (Vit-D Deficiency, Fractures)    VITAMIN D 25 Hydroxy (Vit-D Deficiency, Fractures)    TSH    CBC with Differential/Platelet    Lipid panel    3. SCLEROSIS, MULTIPLE  G35 Lipid panel    CBC with Differential/Platelet    TSH    VITAMIN D 25 Hydroxy (Vit-D Deficiency, Fractures)    VITAMIN D 25 Hydroxy (Vit-D Deficiency, Fractures)    TSH    CBC with Differential/Platelet    Lipid panel    4. High risk medication use  Z79.899 Lipid panel    CBC with Differential/Platelet    TSH    VITAMIN D 25 Hydroxy (Vit-D Deficiency, Fractures)    VITAMIN D 25 Hydroxy (Vit-D Deficiency, Fractures)    TSH    CBC with Differential/Platelet    Lipid panel    Bp control discussed  refill med    Plan monitoring labs due (not done by neuro) Glad her bladder control is better controlled  Return in about 1 year (around 04/24/2022) for depending on results.  Patient  Care Team: Burnis Medin, MD as PCP - General Konrad Penta, MD as Referring Physician (Obstetrics and Gynecology) Ceasar Mons, MD as Consulting Physician (Urology) Sydnee Levans, MD as Consulting Physician (Dermatology) Karl Luke, MD as Referring Physician (Neurology) Viona Gilmore, [facility] as Pharmacist (Pharmacist) Patient Instructions  Good to see you today   Get flu shot , prevnar 20 ( of pneumonia shots not done yet)   Will notify you  of labs when available.    ? If go shingrix vaccine for  zoster .  Make sure BP if below 140/90  Mariann Laster K. Reyce Lubeck M.D.

## 2021-04-24 ENCOUNTER — Other Ambulatory Visit: Payer: Self-pay

## 2021-04-24 ENCOUNTER — Encounter: Payer: Self-pay | Admitting: Internal Medicine

## 2021-04-24 ENCOUNTER — Ambulatory Visit (INDEPENDENT_AMBULATORY_CARE_PROVIDER_SITE_OTHER): Payer: Medicare Other | Admitting: Internal Medicine

## 2021-04-24 VITALS — BP 150/80 | HR 74 | Temp 97.9°F | Ht 66.0 in | Wt 152.8 lb

## 2021-04-24 DIAGNOSIS — Z79899 Other long term (current) drug therapy: Secondary | ICD-10-CM | POA: Diagnosis not present

## 2021-04-24 DIAGNOSIS — G35 Multiple sclerosis: Secondary | ICD-10-CM | POA: Diagnosis not present

## 2021-04-24 DIAGNOSIS — Z0000 Encounter for general adult medical examination without abnormal findings: Secondary | ICD-10-CM | POA: Diagnosis not present

## 2021-04-24 DIAGNOSIS — I10 Essential (primary) hypertension: Secondary | ICD-10-CM | POA: Diagnosis not present

## 2021-04-24 LAB — CBC WITH DIFFERENTIAL/PLATELET
Basophils Absolute: 0.1 10*3/uL (ref 0.0–0.1)
Basophils Relative: 0.6 % (ref 0.0–3.0)
Eosinophils Absolute: 0.4 10*3/uL (ref 0.0–0.7)
Eosinophils Relative: 4.3 % (ref 0.0–5.0)
HCT: 39.4 % (ref 36.0–46.0)
Hemoglobin: 13.5 g/dL (ref 12.0–15.0)
Lymphocytes Relative: 39.9 % (ref 12.0–46.0)
Lymphs Abs: 3.4 10*3/uL (ref 0.7–4.0)
MCHC: 34.2 g/dL (ref 30.0–36.0)
MCV: 91 fl (ref 78.0–100.0)
Monocytes Absolute: 0.7 10*3/uL (ref 0.1–1.0)
Monocytes Relative: 8.2 % (ref 3.0–12.0)
Neutro Abs: 4 10*3/uL (ref 1.4–7.7)
Neutrophils Relative %: 47 % (ref 43.0–77.0)
Platelets: 291 10*3/uL (ref 150.0–400.0)
RBC: 4.33 Mil/uL (ref 3.87–5.11)
RDW: 13.4 % (ref 11.5–15.5)
WBC: 8.6 10*3/uL (ref 4.0–10.5)

## 2021-04-24 LAB — LIPID PANEL
Cholesterol: 172 mg/dL (ref 0–200)
HDL: 55.8 mg/dL (ref >39.00)
LDL Cholesterol: 94 mg/dL (ref 0–99)
NonHDL: 116.11
Total CHOL/HDL Ratio: 3
Triglycerides: 112 mg/dL (ref 0.0–149.0)
VLDL: 22.4 mg/dL (ref 0.0–40.0)

## 2021-04-24 LAB — VITAMIN D 25 HYDROXY (VIT D DEFICIENCY, FRACTURES): VITD: 82.38 ng/mL (ref 30.00–100.00)

## 2021-04-24 LAB — TSH: TSH: 1.12 u[IU]/mL (ref 0.35–5.50)

## 2021-04-24 NOTE — Patient Instructions (Addendum)
Good to see you today   Get flu shot , prevnar 20 ( of pneumonia shots not done yet)   Will notify you  of labs when available.   ? If go shingrix vaccine for  zoster .  Make sure BP if below 140/90

## 2021-04-24 NOTE — Progress Notes (Signed)
Lab results are normal and favorable vitamin D high normal.  No action needed.

## 2021-04-30 DIAGNOSIS — Z79899 Other long term (current) drug therapy: Secondary | ICD-10-CM | POA: Diagnosis not present

## 2021-04-30 DIAGNOSIS — G35 Multiple sclerosis: Secondary | ICD-10-CM | POA: Diagnosis not present

## 2021-05-02 DIAGNOSIS — Z85828 Personal history of other malignant neoplasm of skin: Secondary | ICD-10-CM | POA: Diagnosis not present

## 2021-05-02 DIAGNOSIS — L570 Actinic keratosis: Secondary | ICD-10-CM | POA: Diagnosis not present

## 2021-05-02 DIAGNOSIS — D1801 Hemangioma of skin and subcutaneous tissue: Secondary | ICD-10-CM | POA: Diagnosis not present

## 2021-05-02 DIAGNOSIS — L814 Other melanin hyperpigmentation: Secondary | ICD-10-CM | POA: Diagnosis not present

## 2021-05-02 DIAGNOSIS — L821 Other seborrheic keratosis: Secondary | ICD-10-CM | POA: Diagnosis not present

## 2021-05-04 ENCOUNTER — Other Ambulatory Visit: Payer: Self-pay | Admitting: Internal Medicine

## 2021-05-06 DIAGNOSIS — R339 Retention of urine, unspecified: Secondary | ICD-10-CM | POA: Diagnosis not present

## 2021-05-10 DIAGNOSIS — M79672 Pain in left foot: Secondary | ICD-10-CM | POA: Diagnosis not present

## 2021-05-17 DIAGNOSIS — M79672 Pain in left foot: Secondary | ICD-10-CM | POA: Diagnosis not present

## 2021-05-23 DIAGNOSIS — M79672 Pain in left foot: Secondary | ICD-10-CM | POA: Diagnosis not present

## 2021-05-28 DIAGNOSIS — G35 Multiple sclerosis: Secondary | ICD-10-CM | POA: Diagnosis not present

## 2021-05-28 DIAGNOSIS — Z79899 Other long term (current) drug therapy: Secondary | ICD-10-CM | POA: Diagnosis not present

## 2021-05-30 DIAGNOSIS — M79672 Pain in left foot: Secondary | ICD-10-CM | POA: Diagnosis not present

## 2021-06-05 DIAGNOSIS — M79672 Pain in left foot: Secondary | ICD-10-CM | POA: Diagnosis not present

## 2021-06-11 DIAGNOSIS — R339 Retention of urine, unspecified: Secondary | ICD-10-CM | POA: Diagnosis not present

## 2021-06-14 DIAGNOSIS — M79672 Pain in left foot: Secondary | ICD-10-CM | POA: Diagnosis not present

## 2021-06-25 DIAGNOSIS — Z79899 Other long term (current) drug therapy: Secondary | ICD-10-CM | POA: Diagnosis not present

## 2021-06-25 DIAGNOSIS — G35 Multiple sclerosis: Secondary | ICD-10-CM | POA: Diagnosis not present

## 2021-07-01 DIAGNOSIS — M79672 Pain in left foot: Secondary | ICD-10-CM | POA: Diagnosis not present

## 2021-07-07 DIAGNOSIS — M47812 Spondylosis without myelopathy or radiculopathy, cervical region: Secondary | ICD-10-CM | POA: Diagnosis not present

## 2021-07-07 DIAGNOSIS — R202 Paresthesia of skin: Secondary | ICD-10-CM | POA: Diagnosis not present

## 2021-07-07 DIAGNOSIS — G319 Degenerative disease of nervous system, unspecified: Secondary | ICD-10-CM | POA: Diagnosis not present

## 2021-07-07 DIAGNOSIS — G35 Multiple sclerosis: Secondary | ICD-10-CM | POA: Diagnosis not present

## 2021-07-10 DIAGNOSIS — M79672 Pain in left foot: Secondary | ICD-10-CM | POA: Diagnosis not present

## 2021-07-11 DIAGNOSIS — R339 Retention of urine, unspecified: Secondary | ICD-10-CM | POA: Diagnosis not present

## 2021-07-17 DIAGNOSIS — M79672 Pain in left foot: Secondary | ICD-10-CM | POA: Diagnosis not present

## 2021-07-24 DIAGNOSIS — S60222A Contusion of left hand, initial encounter: Secondary | ICD-10-CM | POA: Diagnosis not present

## 2021-07-24 DIAGNOSIS — G35 Multiple sclerosis: Secondary | ICD-10-CM | POA: Diagnosis not present

## 2021-07-24 DIAGNOSIS — Z79899 Other long term (current) drug therapy: Secondary | ICD-10-CM | POA: Diagnosis not present

## 2021-07-24 DIAGNOSIS — S90129A Contusion of unspecified lesser toe(s) without damage to nail, initial encounter: Secondary | ICD-10-CM | POA: Diagnosis not present

## 2021-07-29 DIAGNOSIS — M79672 Pain in left foot: Secondary | ICD-10-CM | POA: Diagnosis not present

## 2021-07-31 ENCOUNTER — Telehealth: Payer: Medicare Other

## 2021-08-01 ENCOUNTER — Telehealth: Payer: Self-pay | Admitting: Pharmacist

## 2021-08-01 NOTE — Chronic Care Management (AMB) (Signed)
Chronic Care Management Pharmacy Assistant   Name: [name]  MRN: [id number] DOB: [birth year]  Reason for Encounter: Disease State General Assessment (Muscle Spasms) and Hypertension   Conditions to be addressed/monitored: HTN  Recent office visits:  04/24/21 Burnis Medin, MD - Patient presented for Visit for preventative health exam and other concerns.  Recent consult visits:  07/07/21 Patient presented to [facility] for MRI CAT SCAN  06/25/21 Karl Luke (Neurology) - Patient presented for Multiple Sclerosis. No other visit details available.  05/28/21 Karl Luke (Neurology) - Patient presented for Multiple Sclerosis. No other visit details available.  04/30/21 Karl Luke (Neurology) - Patient presented for Multiple Sclerosis. No other visit details available.  04/10/21 Ceasar Mons, MD - Patient presented to [facility] for Botox injection. No medication changes.  03/22/21 [facility] Encounter for Left foot pain no other visit details  03/11/21 Ceasar Mons (Urology) - Patient presented for Chronic Cystitis without hematuria. No other visit details available.  Hospital visits:  None in previous 6 months  Medications: Outpatient Encounter Medications as of 08/01/2021  Medication Sig Note   amLODipine (NORVASC) 2.5 MG tablet TAKE ONE TABLET BY MOUTH DAILY    Ascorbic Acid 500 MG CAPS Take 500 mg by mouth daily.    celecoxib (CELEBREX) 100 MG capsule Take 100 mg by mouth 2 (two) times daily as needed.    cephALEXin (KEFLEX) 500 MG capsule Take 500 mg by mouth daily.     Cholecalciferol (VITAMIN D-3) 5000 UNITS TABS Take 1 tablet by mouth daily.    CRANBERRY PO Take 1 capsule by mouth in the morning and at bedtime.    D-MANNOSE PO Take 1 capsule by mouth in the morning and at bedtime.    DULoxetine (CYMBALTA) 60 MG capsule Take 1 capsule by mouth 2 (two) times daily.     esomeprazole (NEXIUM) 20 MG capsule Take 20-40 mg by mouth daily. 04/03/2015: She takes otc version   gabapentin (NEURONTIN) 300 MG capsule Take 300 mg by mouth at bedtime.    Meth-Hyo-M Bl-Na Phos-Ph Sal (URIBEL) 118 MG CAPS Take 1 capsule by mouth daily as needed (bladder spasms). Using PRN    natalizumab (TYSABRI) 300 MG/15ML injection Inject into the vein every 30 (thirty) days. 02/02/2018: Q 28 days   Probiotic Product (PROBIOTIC PEARLS) CAPS Take 1 capsule by mouth in the morning and at bedtime.    No facility-administered encounter medications on file as of 08/01/2021.  Reviewed chart prior to disease state call. Spoke with patient regarding BP  Recent Office Vitals: BP Readings from Last 3 Encounters:  04/24/21 (!) 150/80  04/10/21 (!) 141/69  04/20/20 128/82   Pulse Readings from Last 3 Encounters:  04/24/21 74  04/10/21 65  04/20/20 87    Wt Readings from Last 3 Encounters:  04/24/21 152 lb 12.8 oz (69.3 kg)  04/10/21 152 lb 14.4 oz (69.4 kg)  04/20/20 153 lb 3.2 oz (69.5 kg)     Kidney Function Lab Results  Component Value Date/Time   CREATININE 0.50 04/10/2021 07:29 AM   CREATININE 0.74 06/01/2018 02:25 PM   GFR 83.95 06/01/2018 02:25 PM   GFRNONAA >60 04/03/2015 10:35 AM   GFRAA >60 04/03/2015 10:35 AM    BMP Latest Ref Rng & Units 04/10/2021 06/01/2018 04/27/2017  Glucose 70 - 99 mg/dL 100(H) 101(H) 85  BUN 8 - 23 mg/dL 11 17 12   Creatinine 0.44 - 1.00 mg/dL 0.50  0.74 0.61  Sodium 135 - 145 mmol/L 141 140 140  Potassium 3.5 - 5.1 mmol/L 4.0 4.4 4.1  Chloride 98 - 111 mmol/L 103 102 102  CO2 19 - 32 mEq/L - 30 29  Calcium 8.4 - 10.5 mg/dL - 9.8 9.4    Current antihypertensive regimen:  Amlodipine 2.5 mg 1 tablet daily How often are you checking your Blood Pressure? Does not check Current home BP readings:  BP Readings from Last 3 Encounters:  04/24/21 (!) 150/80  04/10/21 (!) 141/69  04/20/20 128/82   What diet changes have been made to improve Blood  Pressure Control?  Watching sodium What exercise is being done to improve your Blood Pressure Control?  Gym/travel  Adherence Review: Is the patient currently on ACE/ARB medication? No Does the patient have >5 day gap between last estimated fill dates? No   Notes:  Per [name] Inquired with patient how she has been doing with the muscle spasms and if they are still bother her she reports she has MS and will have them for the rest of her life and has recently started back her baclofen and it is working well. Patient reports she does not check her blood pressures at home, has it checked once a month at her appointments has not noticed and headache dizziness or lightheadedness.   Care Gaps: CCM- 3/23 BP- 150/80 (04/24/21) PNA Vaccine - Overdue Zoster Vaccine - Overdue Mammogram - Overdue COVID Booster - Overdue Flu Vaccine - Overdue  Star Rating Drugs: None   Patient Assistance: None  Ned Clines [facility] Clinical Pharmacist Assistant [phone]

## 2021-08-02 ENCOUNTER — Telehealth: Payer: Self-pay | Admitting: Internal Medicine

## 2021-08-02 DIAGNOSIS — M79672 Pain in left foot: Secondary | ICD-10-CM | POA: Diagnosis not present

## 2021-08-02 NOTE — Telephone Encounter (Signed)
Left message for patient to call back and schedule Medicare Annual Wellness Visit (AWV) either virtually or in office. Left  my [name] number [phone]   Last AWV 08/20/20 please schedule at anytime with [facility] Nurse Health Advisor 1 or 2   This should be a 45 minute visit.

## 2021-08-05 DIAGNOSIS — H2511 Age-related nuclear cataract, right eye: Secondary | ICD-10-CM | POA: Diagnosis not present

## 2021-08-05 DIAGNOSIS — Z961 Presence of intraocular lens: Secondary | ICD-10-CM | POA: Diagnosis not present

## 2021-08-05 DIAGNOSIS — H524 Presbyopia: Secondary | ICD-10-CM | POA: Diagnosis not present

## 2021-08-05 DIAGNOSIS — H04123 Dry eye syndrome of bilateral lacrimal glands: Secondary | ICD-10-CM | POA: Diagnosis not present

## 2021-08-08 DIAGNOSIS — R339 Retention of urine, unspecified: Secondary | ICD-10-CM | POA: Diagnosis not present

## 2021-08-09 DIAGNOSIS — M79672 Pain in left foot: Secondary | ICD-10-CM | POA: Diagnosis not present

## 2021-08-12 ENCOUNTER — Ambulatory Visit (INDEPENDENT_AMBULATORY_CARE_PROVIDER_SITE_OTHER): Payer: Medicare Other | Admitting: Internal Medicine

## 2021-08-12 ENCOUNTER — Encounter: Payer: Self-pay | Admitting: Internal Medicine

## 2021-08-12 ENCOUNTER — Ambulatory Visit (INDEPENDENT_AMBULATORY_CARE_PROVIDER_SITE_OTHER): Payer: Medicare Other

## 2021-08-12 VITALS — Ht 66.0 in | Wt 155.0 lb

## 2021-08-12 VITALS — BP 140/80 | HR 81 | Temp 98.7°F | Ht 66.0 in | Wt 155.4 lb

## 2021-08-12 DIAGNOSIS — L819 Disorder of pigmentation, unspecified: Secondary | ICD-10-CM | POA: Diagnosis not present

## 2021-08-12 DIAGNOSIS — Z8249 Family history of ischemic heart disease and other diseases of the circulatory system: Secondary | ICD-10-CM | POA: Diagnosis not present

## 2021-08-12 DIAGNOSIS — G35 Multiple sclerosis: Secondary | ICD-10-CM

## 2021-08-12 DIAGNOSIS — Z0000 Encounter for general adult medical examination without abnormal findings: Secondary | ICD-10-CM

## 2021-08-12 DIAGNOSIS — Z79899 Other long term (current) drug therapy: Secondary | ICD-10-CM | POA: Diagnosis not present

## 2021-08-12 LAB — CBC WITH DIFFERENTIAL/PLATELET
Basophils Absolute: 0.1 10*3/uL (ref 0.0–0.1)
Basophils Relative: 0.6 % (ref 0.0–3.0)
Eosinophils Absolute: 0.6 10*3/uL (ref 0.0–0.7)
Eosinophils Relative: 4.4 % (ref 0.0–5.0)
HCT: 38.4 % (ref 36.0–46.0)
Hemoglobin: 12.9 g/dL (ref 12.0–15.0)
Lymphocytes Relative: 22 % (ref 12.0–46.0)
Lymphs Abs: 3 10*3/uL (ref 0.7–4.0)
MCHC: 33.7 g/dL (ref 30.0–36.0)
MCV: 90 fl (ref 78.0–100.0)
Monocytes Absolute: 1 10*3/uL (ref 0.1–1.0)
Monocytes Relative: 7.4 % (ref 3.0–12.0)
Neutro Abs: 8.8 10*3/uL — ABNORMAL HIGH (ref 1.4–7.7)
Neutrophils Relative %: 65.6 % (ref 43.0–77.0)
Platelets: 281 10*3/uL (ref 150.0–400.0)
RBC: 4.26 Mil/uL (ref 3.87–5.11)
RDW: 13.1 % (ref 11.5–15.5)
WBC: 13.5 10*3/uL — ABNORMAL HIGH (ref 4.0–10.5)

## 2021-08-12 LAB — COMPREHENSIVE METABOLIC PANEL
ALT: 20 U/L (ref 0–35)
AST: 18 U/L (ref 0–37)
Albumin: 4.4 g/dL (ref 3.5–5.2)
Alkaline Phosphatase: 84 U/L (ref 39–117)
BUN: 12 mg/dL (ref 6–23)
CO2: 32 mEq/L (ref 19–32)
Calcium: 9.7 mg/dL (ref 8.4–10.5)
Chloride: 102 mEq/L (ref 96–112)
Creatinine, Ser: 0.56 mg/dL (ref 0.40–1.20)
GFR: 94.52 mL/min (ref >60.00)
Glucose, Bld: 83 mg/dL (ref 70–99)
Potassium: 3.8 mEq/L (ref 3.5–5.1)
Sodium: 140 mEq/L (ref 135–145)
Total Bilirubin: 0.6 mg/dL (ref 0.2–1.2)
Total Protein: 6.9 g/dL (ref 6.0–8.3)

## 2021-08-12 LAB — C-REACTIVE PROTEIN: CRP: <1 mg/dL (ref 0.5–20.0)

## 2021-08-12 NOTE — Patient Instructions (Signed)
Pulses are good in feet .  Doubt vascular obstruction Could be side effect of  medication and or the MS but   checking lab  for autoimmune  screening.  I f ok then  follow  and let us know if  something worsening

## 2021-08-12 NOTE — Patient Instructions (Signed)
[name] , Thank you for taking time to come for your Medicare Wellness Visit. I appreciate your ongoing commitment to your health goals. Please review the following plan we discussed and let me know if I can assist you in the future.   Screening recommendations/referrals: Colonoscopy: completed 01/10/2015 Mammogram: scheduled 08/28/2021 Bone Density: completed 04/28/2016 Recommended yearly ophthalmology/optometry visit for glaucoma screening and checkup Recommended yearly dental visit for hygiene and checkup  Vaccinations: Influenza vaccine: completed 06/26/2021 Pneumococcal vaccine: completed 06/26/2021 Tdap vaccine: completed 07/12/2015, due 07/11/2025 Shingles vaccine: discussed   Covid-19: 05/21/2020, 10/21/2019, 09/07/2019, 08/17/2019  Advanced directives: Please bring a copy of your POA (Power of Attorney) and/or Living Will to your next appointment.   Conditions/risks identified: none  Next appointment: Follow up in one year for your annual wellness visit    Preventive Care 65 Years and Older, Female Preventive care refers to lifestyle choices and visits with your health care provider that can promote health and wellness. What does preventive care include? A yearly physical exam. This is also called an annual well check. Dental exams once or twice a year. Routine eye exams. Ask your health care provider how often you should have your eyes checked. Personal lifestyle choices, including: Daily care of your teeth and gums. Regular physical activity. Eating a healthy diet. Avoiding tobacco and drug use. Limiting alcohol use. Practicing safe sex. Taking low-dose aspirin every day. Taking vitamin and mineral supplements as recommended by your health care provider. What happens during an annual well check? The services and screenings done by your health care provider during your annual well check will depend on your age, overall health, lifestyle risk factors, and family history  of disease. Counseling  Your health care provider may ask you questions about your: Alcohol use. Tobacco use. Drug use. Emotional well-being. Home and relationship well-being. Sexual activity. Eating habits. History of falls. Memory and ability to understand (cognition). Work and work Statistician. Reproductive health. Screening  You may have the following tests or measurements: Height, weight, and BMI. Blood pressure. Lipid and cholesterol levels. These may be checked every 5 years, or more frequently if you are over 35 years old. Skin check. Lung cancer screening. You may have this screening every year starting at age 1 if you have a 30-pack-year history of smoking and currently smoke or have quit within the past 15 years. Fecal occult blood test (FOBT) of the stool. You may have this test every year starting at age 54. Flexible sigmoidoscopy or colonoscopy. You may have a sigmoidoscopy every 5 years or a colonoscopy every 10 years starting at age 81. Hepatitis C blood test. Hepatitis B blood test. Sexually transmitted disease (STD) testing. Diabetes screening. This is done by checking your blood sugar (glucose) after you have not eaten for a while (fasting). You may have this done every 1-3 years. Bone density scan. This is done to screen for osteoporosis. You may have this done starting at age 44. Mammogram. This may be done every 1-2 years. Talk to your health care provider about how often you should have regular mammograms. Talk with your health care provider about your test results, treatment options, and if necessary, the need for more tests. Vaccines  Your health care provider may recommend certain vaccines, such as: Influenza vaccine. This is recommended every year. Tetanus, diphtheria, and acellular pertussis (Tdap, Td) vaccine. You may need a Td booster every 10 years. Zoster vaccine. You may need this after age 23. Pneumococcal 13-valent conjugate (PCV13) vaccine. One  dose is recommended after age 18. Pneumococcal polysaccharide (PPSV23) vaccine. One dose is recommended after age 6. Talk to your health care provider about which screenings and vaccines you need and how often you need them. This information is not intended to replace advice given to you by your health care provider. Make sure you discuss any questions you have with your health care provider. Document Released: 08/10/2015 Document Revised: 04/02/2016 Document Reviewed: 05/15/2015 Elsevier Interactive Patient Education  2017 [place] Prevention in the Home Falls can cause injuries. They can happen to people of all ages. There are many things you can do to make your home safe and to help prevent falls. What can I do on the outside of my home? Regularly fix the edges of walkways and driveways and fix any cracks. Remove anything that might make you trip as you walk through a door, such as a raised step or threshold. Trim any bushes or trees on the path to your home. Use bright outdoor lighting. Clear any walking paths of anything that might make someone trip, such as rocks or tools. Regularly check to see if handrails are loose or broken. Make sure that both sides of any steps have handrails. Any raised decks and porches should have guardrails on the edges. Have any leaves, snow, or ice cleared regularly. Use sand or salt on walking paths during winter. Clean up any spills in your garage right away. This includes oil or grease spills. What can I do in the bathroom? Use night lights. Install grab bars by the toilet and in the tub and shower. Do not use towel bars as grab bars. Use non-skid mats or decals in the tub or shower. If you need to sit down in the shower, use a plastic, non-slip stool. Keep the floor dry. Clean up any water that spills on the floor as soon as it happens. Remove soap buildup in the tub or shower regularly. Attach bath mats securely with double-sided  non-slip rug tape. Do not have throw rugs and other things on the floor that can make you trip. What can I do in the bedroom? Use night lights. Make sure that you have a light by your bed that is easy to reach. Do not use any sheets or blankets that are too big for your bed. They should not hang down onto the floor. Have a firm chair that has side arms. You can use this for support while you get dressed. Do not have throw rugs and other things on the floor that can make you trip. What can I do in the kitchen? Clean up any spills right away. Avoid walking on wet floors. Keep items that you use a lot in easy-to-reach places. If you need to reach something above you, use a strong step stool that has a grab bar. Keep electrical cords out of the way. Do not use floor polish or wax that makes floors slippery. If you must use wax, use non-skid floor wax. Do not have throw rugs and other things on the floor that can make you trip. What can I do with my stairs? Do not leave any items on the stairs. Make sure that there are handrails on both sides of the stairs and use them. Fix handrails that are broken or loose. Make sure that handrails are as long as the stairways. Check any carpeting to make sure that it is firmly attached to the stairs. Fix any carpet that is loose or worn. Avoid  having throw rugs at the top or bottom of the stairs. If you do have throw rugs, attach them to the floor with carpet tape. Make sure that you have a light switch at the top of the stairs and the bottom of the stairs. If you do not have them, ask someone to add them for you. What else can I do to help prevent falls? Wear shoes that: Do not have high heels. Have rubber bottoms. Are comfortable and fit you well. Are closed at the toe. Do not wear sandals. If you use a stepladder: Make sure that it is fully opened. Do not climb a closed stepladder. Make sure that both sides of the stepladder are locked into place. Ask  someone to hold it for you, if possible. Clearly mark and make sure that you can see: Any grab bars or handrails. First and last steps. Where the edge of each step is. Use tools that help you move around (mobility aids) if they are needed. These include: Canes. Walkers. Scooters. Crutches. Turn on the lights when you go into a dark area. Replace any light bulbs as soon as they burn out. Set up your furniture so you have a clear path. Avoid moving your furniture around. If any of your floors are uneven, fix them. If there are any pets around you, be aware of where they are. Review your medicines with your doctor. Some medicines can make you feel dizzy. This can increase your chance of falling. Ask your doctor what other things that you can do to help prevent falls. This information is not intended to replace advice given to you by your health care provider. Make sure you discuss any questions you have with your health care provider. Document Released: 05/10/2009 Document Revised: 12/20/2015 Document Reviewed: 08/18/2014 Elsevier Interactive Patient Education  2017 [employer].

## 2021-08-12 NOTE — Progress Notes (Signed)
Chief Complaint  Patient presents with   Bleeding/Bruising    Bruising on feet ankles and hands    HPI: [name] 66 y.o. come in for  concern poss gong on a year but some worse noted sx recently.   Purplish color to feet and toes come and go   had  ring finger turn purple after carrying grocery bag and then gone the next day . Had pain .  Feet no pain describe and says not numbe but has decrease dorsiflexion strength.  R left foot swelled up and then resolved.   Is aware Gabapentin  can cause swelling  neurologist says problem is not neuro  or med related .   Brother dx with raynaud's recently ROS: See pertinent positives and negatives per HPI. No unusual bleeding  or sig bruising per se  Past Medical History:  Diagnosis Date   Ambulates with cane 04/08/2021   prn   Concussion 2019   no residual   Depressive reaction    Diverticulosis    Frequent UTI    last uti 1 year ago per pt on 04-08-2021   Gastropathy 07/29/2011   reactive   GERD (gastroesophageal reflux disease)    Hypertension    Mood disorder ([place])    MS (multiple sclerosis) ([place]) 2007   Self-catheterizes urinary bladder 04/08/2021   self caths 2 to 3 x daily   SUI (stress urinary incontinence, female) 04/08/2021   wears depends   Urinary frequency 04/08/2021   Uses walker 04/08/2021   prn    Family History  Problem Relation Age of Onset   Lung cancer Father    Colon cancer Neg Hx     Social History   Socioeconomic History   Marital status: Married    Spouse name: Not on file   Number of children: 4   Years of education: Not on file   Highest education level: Not on file  Occupational History   Occupation: Disability  Tobacco Use   Smoking status: Never   Smokeless tobacco: Never  Vaping Use   Vaping Use: Never used  Substance and Sexual Activity   Alcohol use: Yes    Comment: 2 glasses wine per day   Drug use: No   Sexual activity: Not on file  Other Topics Concern   Not on  file  Social History Narrative   Unable to volunteer    Now on disability   Children now out of home   Daily caffeine    Social Determinants of Health   Financial Resource Strain: Low Risk    Difficulty of Paying Living Expenses: Not hard at all  Food Insecurity: No Food Insecurity   Worried About Charity fundraiser in the Last Year: Never true   [place] in the Last Year: Never true  Transportation Needs: No Transportation Needs   Lack of Transportation (Medical): No   Lack of Transportation (Non-Medical): No  Physical Activity: Insufficiently Active   Days of Exercise per Week: 2 days   Minutes of Exercise per Session: 60 min  Stress: No Stress Concern Present   Feeling of Stress : Not at all  Social Connections: Socially Integrated   Frequency of Communication with Friends and Family: More than three times a week   Frequency of Social Gatherings with Friends and Family: More than three times a week   Attends Religious Services: More than 4 times per year   Active Member of [employer] or Organizations:  Yes   Attends Archivist Meetings: More than 4 times per year   Marital Status: Married    Outpatient Medications Prior to Visit  Medication Sig Dispense Refill   amLODipine (NORVASC) 2.5 MG tablet TAKE ONE TABLET BY MOUTH DAILY 90 tablet 3   Ascorbic Acid 500 MG CAPS Take 500 mg by mouth daily.     cephALEXin (KEFLEX) 500 MG capsule Take 500 mg by mouth daily.      Cholecalciferol (VITAMIN D-3) 5000 UNITS TABS Take 1 tablet by mouth daily.     CRANBERRY PO Take 1 capsule by mouth in the morning and at bedtime.     D-MANNOSE PO Take 1 capsule by mouth in the morning and at bedtime.     DULoxetine (CYMBALTA) 60 MG capsule Take 1 capsule by mouth 2 (two) times daily.     esomeprazole (NEXIUM) 20 MG capsule Take 20-40 mg by mouth daily.     gabapentin (NEURONTIN) 300 MG capsule Take 300 mg by mouth at bedtime.     Meth-Hyo-M Bl-Na Phos-Ph Sal (URIBEL) 118 MG CAPS  Take 1 capsule by mouth daily as needed (bladder spasms). Using PRN     natalizumab (TYSABRI) 300 MG/15ML injection Inject into the vein every 30 (thirty) days.     Probiotic Product (PROBIOTIC PEARLS) CAPS Take 1 capsule by mouth in the morning and at bedtime.     celecoxib (CELEBREX) 100 MG capsule Take 100 mg by mouth 2 (two) times daily as needed. (Patient not taking: Reported on 08/12/2021)     No facility-administered medications prior to visit.     EXAM:  BP 140/80 (BP Location: Right Arm, Patient Position: Sitting, Cuff Size: Normal)    Pulse 81    Temp 98.7 F (37.1 C) (Oral)    Ht '5\' 6"'  (1.676 m)    Wt 155 lb 6.4 oz (70.5 kg)    SpO2 98%    BMI 25.08 kg/m   Body mass index is 25.08 kg/m.  GENERAL: vitals reviewed and listed above, alert, oriented, appears well hydrated and in no acute distress HEENT: atraumatic, conjunctiva  clear, no obvious abnormalities on inspection of external nose and ears OP : masked  NECK: no obvious masses on inspection palpation   Hand thinning xt but no bruising and nl cap refill Feet  distal toes  slight dec cap refill 3 sec ( cool in room  faint purplish) no ulcers and feet otherwise nl color  and nl dp pt pulses .  No edema  no ulcers  Skin no petechia or overt bruising  PSYCH: pleasant and cooperative, no obvious depression or anxiety Lab Results  Component Value Date   WBC 8.6 04/24/2021   HGB 13.5 04/24/2021   HCT 39.4 04/24/2021   PLT 291.0 04/24/2021   GLUCOSE 100 (H) 04/10/2021   CHOL 172 04/24/2021   TRIG 112.0 04/24/2021   HDL 55.80 04/24/2021   LDLDIRECT 110.0 04/23/2015   LDLCALC 94 04/24/2021   ALT 27 06/01/2018   AST 20 06/01/2018   NA 141 04/10/2021   K 4.0 04/10/2021   CL 103 04/10/2021   CREATININE 0.50 04/10/2021   BUN 11 04/10/2021   CO2 30 06/01/2018   TSH 1.12 04/24/2021   HGBA1C 5.2 02/13/2014   BP Readings from Last 3 Encounters:  08/12/21 140/80  04/24/21 (!) 150/80  04/10/21 (!) 141/69    ASSESSMENT  AND PLAN:  Discussed the following assessment and plan:  Discoloration of skin of foot -  Plan: CBC with Differential/Platelet, C-reactive protein, ANA, Electrophoresis, Protein and Immunofixation, Comprehensive metabolic panel, ANA, C-reactive protein, CBC with Differential/Platelet, Comprehensive metabolic panel, Multiple Myeloma Panel (SPEP&IFE w/QIG), Multiple Myeloma Panel (SPEP&IFE w/QIG)  Multiple sclerosis ([place]) - Plan: CBC with Differential/Platelet, C-reactive protein, ANA, Electrophoresis, Protein and Immunofixation, Comprehensive metabolic panel, ANA, C-reactive protein, CBC with Differential/Platelet, Comprehensive metabolic panel, Multiple Myeloma Panel (SPEP&IFE w/QIG), Multiple Myeloma Panel (SPEP&IFE w/QIG)  High risk medication use - Plan: CBC with Differential/Platelet, C-reactive protein, ANA, Electrophoresis, Protein and Immunofixation, Comprehensive metabolic panel, ANA, C-reactive protein, CBC with Differential/Platelet, Comprehensive metabolic panel, Multiple Myeloma Panel (SPEP&IFE w/QIG), Multiple Myeloma Panel (SPEP&IFE w/QIG)  Family history of Raynaud's phenomenon Not bruising but discoloration  consider neuro based  vascular effect or temp  reactivity  Fortunately no other sx   will add screening labs and if unrevealing follow  .   If swelling returns and stays  progressive then consider  doppler other etc  -Patient advised to return or notify health care team  if  new concerns arise.  Patient Instructions  Pulses are good in feet .  Doubt vascular obstruction Could be side effect of  medication and or the MS but   checking lab  for autoimmune  screening.  I f ok then  follow  and let us know if  something worsening    [name]. Charmagne Buhl M.D.

## 2021-08-12 NOTE — Progress Notes (Signed)
I connected with  [name] today via telehealth video enabled device and verified that I am speaking with the correct person using two identifiers.   Location: Patient: home Provider: work  Persons participating in virtual visit: Sharel Behne, Glenna Durand LPN  I discussed the limitations, risks, security and privacy concerns of performing an evaluation and management service by video and the availability of in person appointments. The patient expressed understanding and agreed to proceed.   Some vital signs may be absent or patient reported.     Subjective:   [name] is a 66 y.o. female who presents for Medicare Annual (Subsequent) preventive examination.  Review of Systems     Cardiac Risk Factors include: advanced age (>36men, >39 women);hypertension     Objective:    Today's Vitals   08/12/21 1558  Weight: 155 lb (70.3 kg)  Height: 5\' 6"  (1.676 m)  PainSc: 4    Body mass index is 25.02 kg/m.  Advanced Directives 08/12/2021 04/10/2021 08/20/2020 05/12/2019 03/05/2019 02/02/2018 12/04/2015  Does Patient Have a Medical Advance Directive? Yes Yes Yes Yes No Yes Yes  Type of Paramedic of [place];Living will [place];Living will [place];Living will [place];Living will - - Living will;Healthcare Power of Attorney  Does patient want to make changes to medical advance directive? - - No - Patient declined No - Patient declined - - -  Copy of [place] in Chart? No - copy requested No - copy requested No - copy requested - - - -  Would patient like information on creating a medical advance directive? - - - - - - -  Pre-existing out of facility DNR order (yellow form or pink MOST form) - - - - - - -    Current Medications (verified) Outpatient Encounter Medications as of 08/12/2021  Medication Sig   amLODipine (NORVASC) 2.5 MG tablet TAKE ONE TABLET BY  MOUTH DAILY   Ascorbic Acid 500 MG CAPS Take 500 mg by mouth daily.   celecoxib (CELEBREX) 100 MG capsule Take 100 mg by mouth 2 (two) times daily as needed. (Patient not taking: Reported on 08/12/2021)   cephALEXin (KEFLEX) 500 MG capsule Take 500 mg by mouth daily.    Cholecalciferol (VITAMIN D-3) 5000 UNITS TABS Take 1 tablet by mouth daily.   CRANBERRY PO Take 1 capsule by mouth in the morning and at bedtime.   D-MANNOSE PO Take 1 capsule by mouth in the morning and at bedtime.   DULoxetine (CYMBALTA) 60 MG capsule Take 1 capsule by mouth 2 (two) times daily.   esomeprazole (NEXIUM) 20 MG capsule Take 20-40 mg by mouth daily.   gabapentin (NEURONTIN) 300 MG capsule Take 300 mg by mouth at bedtime.   Meth-Hyo-M Bl-Na Phos-Ph Sal (URIBEL) 118 MG CAPS Take 1 capsule by mouth daily as needed (bladder spasms). Using PRN   natalizumab (TYSABRI) 300 MG/15ML injection Inject into the vein every 30 (thirty) days.   Probiotic Product (PROBIOTIC PEARLS) CAPS Take 1 capsule by mouth in the morning and at bedtime.   No facility-administered encounter medications on file as of 08/12/2021.    Allergies (verified) Meclizine hcl, Tramadol, Hydrocodone, Omeprazole magnesium, Oxycodone, Prilosec [omeprazole magnesium], and Tramadol hcl   History: Past Medical History:  Diagnosis Date   Ambulates with cane 04/08/2021   prn   Concussion 2019   no residual   Depressive reaction    Diverticulosis    Frequent  UTI    last uti 1 year ago per pt on 04-08-2021   Gastropathy 07/29/2011   reactive   GERD (gastroesophageal reflux disease)    Hypertension    Mood disorder ([place])    MS (multiple sclerosis) ([place]) 2007   Self-catheterizes urinary bladder 04/08/2021   self caths 2 to 3 x daily   SUI (stress urinary incontinence, female) 04/08/2021   wears depends   Urinary frequency 04/08/2021   Uses walker 04/08/2021   prn   Past Surgical History:  Procedure Laterality Date   ABDOMINAL HYSTERECTOMY  2008    partial   BLADDER SURGERY  2008   Tack   BOTOX INJECTION N/A 04/10/2021   Procedure: BOTOX INJECTION WITH CYSTOSCOPY;  Surgeon: Ceasar Mons, MD;  [facility];  Service: Urology;  Laterality: N/A;   SHOULDER ARTHROSCOPY WITH ROTATOR CUFF REPAIR AND SUBACROMIAL DECOMPRESSION  06/01/2012   Procedure: SHOULDER ARTHROSCOPY WITH ROTATOR CUFF REPAIR AND SUBACROMIAL DECOMPRESSION;  Surgeon: Magnus Sinning, MD;  Location: [place];  Service: Orthopedics;  Laterality: Left;  LEFT SHOULDER ARTHROSCOPY WITH SUBACROMIAL DECOMPRESION AND DEBRIDEMENT OF PARTIAL ROTATOR CUFF TEAR   TONSILLECTOMY AND ADENOIDECTOMY  1962   Family History  Problem Relation Age of Onset   Lung cancer Father    Colon cancer Neg Hx    Social History   Socioeconomic History   Marital status: Married    Spouse name: Not on file   Number of children: 4   Years of education: Not on file   Highest education level: Not on file  Occupational History   Occupation: Disability  Tobacco Use   Smoking status: Never   Smokeless tobacco: Never  Vaping Use   Vaping Use: Never used  Substance and Sexual Activity   Alcohol use: Yes    Comment: 2 glasses wine per day   Drug use: No   Sexual activity: Not on file  Other Topics Concern   Not on file  Social History Narrative   Unable to volunteer    Now on disability   Children now out of home   Daily caffeine    Social Determinants of Health   Financial Resource Strain: Low Risk    Difficulty of Paying Living Expenses: Not hard at all  Food Insecurity: No Food Insecurity   Worried About Charity fundraiser in the Last Year: Never true   [place] in the Last Year: Never true  Transportation Needs: No Transportation Needs   Lack of Transportation (Medical): No   Lack of Transportation (Non-Medical): No  Physical Activity: Insufficiently Active   Days of Exercise per Week: 3 days   Minutes of Exercise per  Session: 20 min  Stress: No Stress Concern Present   Feeling of Stress : Not at all  Social Connections: Socially Integrated   Frequency of Communication with Friends and Family: More than three times a week   Frequency of Social Gatherings with Friends and Family: More than three times a week   Attends Religious Services: More than 4 times per year   Active Member of [employer] or Organizations: Yes   Attends Music therapist: More than 4 times per year   Marital Status: Married    Tobacco Counseling Counseling given: Not Answered   Clinical Intake:  Pre-visit preparation completed: Yes  Pain : 0-10 Pain Score: 4  Pain Type: Acute pain Pain Location: Arm (ribs) Pain Orientation: Left Pain Descriptors / Indicators:  Aching Pain Onset: Today Pain Frequency: Constant     Nutritional Status: BMI 25 -29 Overweight Nutritional Risks: None Diabetes: No  How often do you need to have someone help you when you read instructions, pamphlets, or other written materials from your doctor or pharmacy?: 1 - Never What is the last grade level you completed in school?: college  Diabetic? no  Interpreter Needed?: No  Information entered by :: NAllen LPN   Activities of Daily Living In your present state of health, do you have any difficulty performing the following activities: 08/12/2021 04/10/2021  Hearing? N N  Vision? N N  Difficulty concentrating or making decisions? N N  Walking or climbing stairs? Y Y  Dressing or bathing? N N  Doing errands, shopping? N -  Preparing Food and eating ? N -  Using the Toilet? N -  In the past six months, have you accidently leaked urine? N -  Do you have problems with loss of bowel control? N -  Managing your Medications? N -  Managing your Finances? N -  Housekeeping or managing your Housekeeping? Y -  Some recent data might be hidden    Patient Care Team: [name], [name], MD as PCP - General Konrad Penta, MD as Referring  Physician (Obstetrics and Gynecology) Ceasar Mons, MD as Consulting Physician (Urology) Sydnee Levans, MD as Consulting Physician (Dermatology) Karl Luke, MD as Referring Physician (Neurology) Viona Gilmore, [facility] as Pharmacist (Pharmacist)  Indicate any recent Medical Services you may have received from other than [facility] providers in the past year (date may be approximate).     Assessment:   This is a routine wellness examination for [facility].  Hearing/Vision screen Vision Screening - Comments:: Regular eye exams, Dr. Valetta Close  Dietary issues and exercise activities discussed: Current Exercise Habits: Home exercise routine, Type of exercise: walking, Time (Minutes): 20, Frequency (Times/Week): 3, Weekly Exercise (Minutes/Week): 60   Goals Addressed             This Visit's Progress    Patient Stated       08/12/2021, no goals       Depression Screen PHQ 2/9 Scores 08/12/2021 08/20/2020 09/27/2018 06/01/2018 02/02/2018 04/27/2017 03/17/2016  PHQ - 2 Score 0 0 0 0 0 0 0    Fall Risk Fall Risk  08/12/2021 08/20/2020 02/02/2018 04/27/2017 03/17/2016  Falls in the past year? 1 0 Yes - Yes  Number falls in past yr: 1 0 2 or more - 2 or more  Comment - - 3 or 4 times  - -  Injury with Fall? 1 0 Yes - Yes  Comment bruised ribs and arm - did not have to go to the doctor  - -  Risk Factor Category  - - - - High Fall Risk  Risk for fall due to : Impaired balance/gait;Impaired mobility;Medication side effect History of fall(s);Impaired balance/gait Impaired balance/gait;Impaired mobility History of fall(s);Impaired balance/gait Impaired balance/gait  Follow up Falls evaluation completed;Education provided;Falls prevention discussed Falls evaluation completed;Falls prevention discussed Education provided - Falls prevention discussed;Education provided  Comment - - loses her balance; not familiar with surrounding  - doing very well with assistance     FALL RISK PREVENTION  PERTAINING TO THE HOME:  Any stairs in or around the home? Yes  If so, are there any without handrails? No  Home free of loose throw rugs in walkways, pet beds, electrical cords, etc? Yes  Adequate lighting in your home to reduce risk of  falls? Yes   ASSISTIVE DEVICES UTILIZED TO PREVENT FALLS:  Life alert? No  Use of a cane, walker or w/c? Yes  Grab bars in the bathroom? No  Shower chair or bench in shower? No  Elevated toilet seat or a handicapped toilet? Yes   TIMED UP AND GO:  Was the test performed? No .      Cognitive Function:     6CIT Screen 08/12/2021 08/20/2020  What Year? 0 points -  What month? 0 points -  What time? 0 points 0 points  Count back from 20 0 points 0 points  Months in reverse 0 points 0 points  Repeat phrase 0 points 0 points  Total Score 0 -    Immunizations Immunization History  Administered Date(s) Administered   Influenza Whole 05/07/2010, 06/06/2011   Influenza, Quadrivalent, Recombinant, Inj, Pf 04/21/2018, 05/11/2019, 05/11/2019   Influenza,inj,Quad PF,6+ Mos 05/10/2014, 04/23/2015, 04/23/2017   Influenza-Unspecified 04/24/2017, 05/21/2020, 06/26/2021   PFIZER(Purple Top)SARS-COV-2 Vaccination 08/17/2019, 09/07/2019, 10/21/2019, 05/21/2020   Tdap 02/05/2011, 07/12/2015   Zoster Recombinat (Shingrix) 05/11/2019   Zoster, Live 05/10/2014    TDAP status: Up to date  Flu Vaccine status: Up to date  Pneumococcal vaccine status: Up to date  Covid-19 vaccine status: Completed vaccines  Qualifies for Shingles Vaccine? Yes   Zostavax completed No   Shingrix Completed?: Yes  Screening Tests Health Maintenance  Topic Date Due   MAMMOGRAM  02/09/2022 (Originally 02/03/2020)   COVID-19 Vaccine (5 - Booster for [place] series) 02/09/2022 (Originally 07/16/2020)   Zoster Vaccines- Shingrix (2 of 2) 02/09/2022 (Originally 07/06/2019)   COLONOSCOPY (Pts 45-27yrs Insurance coverage will need to be confirmed)  01/09/2025   TETANUS/TDAP   07/11/2025   Pneumonia Vaccine 65+ Years old  Completed   INFLUENZA VACCINE  Completed   DEXA SCAN  Completed   HPV VACCINES  Aged Out   Hepatitis C Screening  Discontinued    Health Maintenance  There are no preventive care reminders to display for this patient.  Colorectal cancer screening: Type of screening: Colonoscopy. Completed 01/10/2015. Repeat every 10 years  Mammogram status: scheduled 08/28/2021  Bone Density status: Completed 04/28/2016.  Lung Cancer Screening: (Low Dose CT Chest recommended if Age 64-80 years, 30 pack-year currently smoking OR have quit w/in 15years.) does not qualify.   Lung Cancer Screening Referral: no  Additional Screening:  Hepatitis C Screening: does qualify;   Vision Screening: Recommended annual ophthalmology exams for early detection of glaucoma and other disorders of the eye. Is the patient up to date with their annual eye exam?  Yes  Who is the provider or what is the name of the office in which the patient attends annual eye exams? Dr. Valetta Close If pt is not established with a provider, would they like to be referred to a provider to establish care? No .   Dental Screening: Recommended annual dental exams for proper oral hygiene  Community Resource Referral / Chronic Care Management: CRR required this visit?  No   CCM required this visit?  No      Plan:     I have personally reviewed and noted the following in the patients chart:   Medical and social history Use of alcohol, tobacco or illicit drugs  Current medications and supplements including opioid prescriptions.  Functional ability and status Nutritional status Physical activity Advanced directives List of other physicians Hospitalizations, surgeries, and ER visits in previous 12 months Vitals Screenings to include cognitive, depression, and falls Referrals and appointments  In addition, I have reviewed and discussed with patient certain preventive protocols, quality  metrics, and best practice recommendations. A written personalized care plan for preventive services as well as general preventive health recommendations were provided to patient.     Kellie Simmering, LPN   09/08/9415   Nurse Notes: none

## 2021-08-15 LAB — ANTI-NUCLEAR AB-TITER (ANA TITER): ANA Titer 1: 1:320 {titer} — ABNORMAL HIGH

## 2021-08-15 LAB — ANA: Anti Nuclear Antibody (ANA): POSITIVE — AB

## 2021-08-17 LAB — MULTIPLE MYELOMA PANEL, SERUM
Albumin SerPl Elph-Mcnc: 3.2 g/dL (ref 2.9–4.4)
Albumin/Glob SerPl: 1.1 (ref 0.7–1.7)
Alpha 1: 0.3 g/dL (ref 0.0–0.4)
Alpha2 Glob SerPl Elph-Mcnc: 0.8 g/dL (ref 0.4–1.0)
B-Globulin SerPl Elph-Mcnc: 1.1 g/dL (ref 0.7–1.3)
Gamma Glob SerPl Elph-Mcnc: 1 g/dL (ref 0.4–1.8)
Globulin, Total: 3.2 g/dL (ref 2.2–3.9)
IgA/Immunoglobulin A, Serum: 175 mg/dL (ref 87–352)
IgG (Immunoglobin G), Serum: 623 mg/dL (ref 586–1602)
IgM (Immunoglobulin M), Srm: 34 mg/dL (ref 26–217)
Total Protein: 6.4 g/dL (ref 6.0–8.5)

## 2021-08-18 NOTE — Progress Notes (Signed)
So the screening for ana  serology is elevated  . Not diagnostic but  can  seen in some to  autoimmune conditions that can cause skin and color changes . And swellings .    Please do a referral to rheumatology for consult   dx :positive ana and swelling color changes

## 2021-08-19 ENCOUNTER — Ambulatory Visit: Payer: Medicare Other | Admitting: Internal Medicine

## 2021-08-19 DIAGNOSIS — M79672 Pain in left foot: Secondary | ICD-10-CM | POA: Diagnosis not present

## 2021-08-20 ENCOUNTER — Other Ambulatory Visit: Payer: Self-pay

## 2021-08-20 DIAGNOSIS — R768 Other specified abnormal immunological findings in serum: Secondary | ICD-10-CM

## 2021-08-23 DIAGNOSIS — M79672 Pain in left foot: Secondary | ICD-10-CM | POA: Diagnosis not present

## 2021-08-27 DIAGNOSIS — Z79899 Other long term (current) drug therapy: Secondary | ICD-10-CM | POA: Diagnosis not present

## 2021-08-27 DIAGNOSIS — G35 Multiple sclerosis: Secondary | ICD-10-CM | POA: Diagnosis not present

## 2021-08-27 DIAGNOSIS — G43009 Migraine without aura, not intractable, without status migrainosus: Secondary | ICD-10-CM | POA: Diagnosis not present

## 2021-08-30 DIAGNOSIS — M79672 Pain in left foot: Secondary | ICD-10-CM | POA: Diagnosis not present

## 2021-09-05 DIAGNOSIS — M8580 Other specified disorders of bone density and structure, unspecified site: Secondary | ICD-10-CM | POA: Diagnosis not present

## 2021-09-06 DIAGNOSIS — M79672 Pain in left foot: Secondary | ICD-10-CM | POA: Diagnosis not present

## 2021-09-13 DIAGNOSIS — M79672 Pain in left foot: Secondary | ICD-10-CM | POA: Diagnosis not present

## 2021-09-14 DIAGNOSIS — R339 Retention of urine, unspecified: Secondary | ICD-10-CM | POA: Diagnosis not present

## 2021-09-16 DIAGNOSIS — N3020 Other chronic cystitis without hematuria: Secondary | ICD-10-CM | POA: Diagnosis not present

## 2021-09-18 DIAGNOSIS — L7211 Pilar cyst: Secondary | ICD-10-CM | POA: Diagnosis not present

## 2021-09-18 DIAGNOSIS — Z85828 Personal history of other malignant neoplasm of skin: Secondary | ICD-10-CM | POA: Diagnosis not present

## 2021-09-18 DIAGNOSIS — L0109 Other impetigo: Secondary | ICD-10-CM | POA: Diagnosis not present

## 2021-09-23 ENCOUNTER — Telehealth: Payer: Self-pay | Admitting: Pharmacist

## 2021-09-23 NOTE — Chronic Care Management (AMB) (Signed)
° ° °  Chronic Care Management Pharmacy Assistant   Name: [name]  MRN: [id number] DOB: [birth year]   09/23/21 APPOINTMENT REMINDER   Called Patient No answer, left message of appointment on 09/25/21 at 2:30 via telephone visit with [name], Pharm D.   Notified to have all medications, supplements, blood pressure and/or blood sugar logs available during appointment and to return call if need to reschedule.   Care Gaps: BP- 140/80 (08/12/21) AWV- 1/23  Star Rating Drug: None  Any gaps in medications fill history?  None   Medications: Outpatient Encounter Medications as of 09/23/2021  Medication Sig Note   amLODipine (NORVASC) 2.5 MG tablet TAKE ONE TABLET BY MOUTH DAILY    Ascorbic Acid 500 MG CAPS Take 500 mg by mouth daily.    celecoxib (CELEBREX) 100 MG capsule Take 100 mg by mouth 2 (two) times daily as needed. (Patient not taking: Reported on 08/12/2021)    cephALEXin (KEFLEX) 500 MG capsule Take 500 mg by mouth daily.     Cholecalciferol (VITAMIN D-3) 5000 UNITS TABS Take 1 tablet by mouth daily.    CRANBERRY PO Take 1 capsule by mouth in the morning and at bedtime.    D-MANNOSE PO Take 1 capsule by mouth in the morning and at bedtime.    DULoxetine (CYMBALTA) 60 MG capsule Take 1 capsule by mouth 2 (two) times daily.    esomeprazole (NEXIUM) 20 MG capsule Take 20-40 mg by mouth daily. 04/03/2015: She takes otc version   gabapentin (NEURONTIN) 300 MG capsule Take 300 mg by mouth at bedtime.    Meth-Hyo-M Bl-Na Phos-Ph Sal (URIBEL) 118 MG CAPS Take 1 capsule by mouth daily as needed (bladder spasms). Using PRN    natalizumab (TYSABRI) 300 MG/15ML injection Inject into the vein every 30 (thirty) days. 02/02/2018: Q 28 days   Probiotic Product (PROBIOTIC PEARLS) CAPS Take 1 capsule by mouth in the morning and at bedtime.    No facility-administered encounter medications on file as of 09/23/2021.    [place] Clinical Pharmacist Assistant [phone]

## 2021-09-24 DIAGNOSIS — Z5181 Encounter for therapeutic drug level monitoring: Secondary | ICD-10-CM | POA: Diagnosis not present

## 2021-09-24 DIAGNOSIS — G35 Multiple sclerosis: Secondary | ICD-10-CM | POA: Diagnosis not present

## 2021-09-24 DIAGNOSIS — Z79899 Other long term (current) drug therapy: Secondary | ICD-10-CM | POA: Diagnosis not present

## 2021-09-24 DIAGNOSIS — M546 Pain in thoracic spine: Secondary | ICD-10-CM | POA: Diagnosis not present

## 2021-09-24 DIAGNOSIS — E559 Vitamin D deficiency, unspecified: Secondary | ICD-10-CM | POA: Diagnosis not present

## 2021-09-24 LAB — BASIC METABOLIC PANEL WITH GFR
BUN: 8 (ref 4–21)
CO2: 29 — AB (ref 13–22)
Chloride: 102 (ref 99–108)
Creatinine: 0.6 (ref 0.5–1.1)
Glucose: 110
Potassium: 3.6 (ref 3.4–5.3)
Sodium: 139 (ref 137–147)

## 2021-09-24 LAB — CBC AND DIFFERENTIAL
HCT: 38 (ref 36–46)
Hemoglobin: 12.9 (ref 12.0–16.0)
Platelets: 249 (ref 150–399)
WBC: 12.4

## 2021-09-24 LAB — HEPATIC FUNCTION PANEL
ALT: 20 (ref 7–35)
AST: 17 (ref 13–35)
Alkaline Phosphatase: 87 (ref 25–125)
Bilirubin, Total: 0.7

## 2021-09-24 LAB — CBC: RBC: 4.24 (ref 3.87–5.11)

## 2021-09-24 LAB — COMPREHENSIVE METABOLIC PANEL
Albumin: 4.4 (ref 3.5–5.0)
Calcium: 9.1 (ref 8.7–10.7)

## 2021-09-25 ENCOUNTER — Encounter: Payer: Self-pay | Admitting: Internal Medicine

## 2021-09-25 ENCOUNTER — Ambulatory Visit (INDEPENDENT_AMBULATORY_CARE_PROVIDER_SITE_OTHER): Payer: Medicare Other | Admitting: Pharmacist

## 2021-09-25 DIAGNOSIS — K219 Gastro-esophageal reflux disease without esophagitis: Secondary | ICD-10-CM

## 2021-09-25 DIAGNOSIS — I10 Essential (primary) hypertension: Secondary | ICD-10-CM

## 2021-09-25 NOTE — Progress Notes (Signed)
Chronic Care Management Pharmacy Note  09/25/2021 Name:  [name] MRN:  [id number] DOB:  [birth year]  Summary: BP has been variable per office readings  Recommendations/Changes made from today's visit: -Recommended supplementation with 600 mg of calcium citrate daily based on calcium intake -Recommended for patient to purchase an arm cuff to replace the wrist cuff and begin checking weekly at home -Recommended trial of saline nasal spray and/or Nasacort to replace Flonase   Plan: BP assessment in 3 months Follow up in 6 months  Subjective: [name] is an 66 y.o. year old female who is a primary patient of [name], [name], MD.  The CCM team was consulted for assistance with disease management and care coordination needs.    Engaged with patient by telephone for follow up visit in response to provider referral for pharmacy case management and/or care coordination services.   Consent to Services:  The patient was given information about Chronic Care Management services, agreed to services, and gave verbal consent prior to initiation of services.  Please see initial visit note for detailed documentation.   Patient Care Team: Burnis Medin, MD as PCP - Silvestre Moment, MD as Referring Physician (Obstetrics and Gynecology) Ceasar Mons, MD as Consulting Physician (Urology) Sydnee Levans, MD as Consulting Physician (Dermatology) Karl Luke, MD as Referring Physician (Neurology) Viona Gilmore, [facility] as Pharmacist (Pharmacist)  Recent office visits: 08/12/21 Glenna Durand, LPN: Patient presented for AWV.   08/12/21 Shanon Ace MD: Patient presented for discoloration of skin on foot. Referral to rheumatology.  04/24/21 [name], [name], MD - Patient presented for Visit for preventative health exam and other concerns.  Recent consult visits: 08/27/21 Karl Luke, MD (neurology): Patient presented for MS follow up and Tysabri  infusion. Prescribed Robaxin to replace baclofen.  07/07/21 Patient presented to [facility] for MRI CAT SCAN   06/25/21 Karl Luke (Neurology) - Patient presented for Multiple Sclerosis. No other visit details available.   05/28/21 Karl Luke (Neurology) - Patient presented for Multiple Sclerosis. No other visit details available.   04/30/21 Karl Luke (Neurology) - Patient presented for Multiple Sclerosis. No other visit details available.   04/10/21 Ceasar Mons, MD - Patient presented to [facility] for Botox injection. No medication changes.  Hospital visits: None in previous 6 months   Objective:  Lab Results  Component Value Date   CREATININE 0.56 08/12/2021   BUN 12 08/12/2021   GFR 94.52 08/12/2021   GFRNONAA >60 04/03/2015   GFRAA >60 04/03/2015   NA 140 08/12/2021   K 3.8 08/12/2021   CALCIUM 9.7 08/12/2021   CO2 32 08/12/2021   GLUCOSE 83 08/12/2021    Lab Results  Component Value Date/Time   HGBA1C 5.2 02/13/2014 10:41 AM   GFR 94.52 08/12/2021 02:10 PM   GFR 83.95 06/01/2018 02:25 PM    Last diabetic Eye exam: No results found for: HMDIABEYEEXA  Last diabetic Foot exam: No results found for: HMDIABFOOTEX   Lab Results  Component Value Date   CHOL 172 04/24/2021   HDL 55.80 04/24/2021   LDLCALC 94 04/24/2021   LDLDIRECT 110.0 04/23/2015   TRIG 112.0 04/24/2021   CHOLHDL 3 04/24/2021    Hepatic Function Latest Ref Rng & Units 08/12/2021 08/12/2021 06/01/2018  Total Protein 6.0 - 8.5 g/dL 6.4 6.9 6.8  Albumin 3.5 - 5.2 g/dL 4.4 - 4.7  AST 0 - 37 U/L 18 - 20  ALT 0 -  35 U/L 20 - 27  Alk Phosphatase 39 - 117 U/L 84 - 75  Total Bilirubin 0.2 - 1.2 mg/dL 0.6 - 0.7  Bilirubin, Direct 0.0 - 0.3 mg/dL - - 0.1    Lab Results  Component Value Date/Time   TSH 1.12 04/24/2021 11:51 AM   TSH 1.01 04/27/2017 01:57 PM   FREET4 0.76 04/23/2015 03:10 PM   FREET4 0.74 02/13/2014 10:41 AM    CBC  Latest Ref Rng & Units 08/12/2021 04/24/2021 04/10/2021  WBC 4.0 - 10.5 K/uL 13.5(H) 8.6 -  Hemoglobin 12.0 - 15.0 g/dL 12.9 13.5 12.2  Hematocrit 36.0 - 46.0 % 38.4 39.4 36.0  Platelets 150.0 - 400.0 K/uL 281.0 291.0 -    Lab Results  Component Value Date/Time   VD25OH 82.38 04/24/2021 11:51 AM   VD25OH 69.39 06/01/2018 02:25 PM    Clinical ASCVD: No  The 10-year ASCVD risk score (Arnett DK, et al., 2019) is: 10.9%   Values used to calculate the score:     Age: 87 years     Sex: Female     Is Non-Hispanic African American: No     Diabetic: No     Tobacco smoker: No     Systolic Blood Pressure: 160 mmHg     Is BP treated: Yes     HDL Cholesterol: 55.8 mg/dL     Total Cholesterol: 172 mg/dL    Depression screen [facility] 2/9 08/12/2021 08/20/2020 09/27/2018  Decreased Interest 0 0 0  Down, Depressed, Hopeless 0 0 0  PHQ - 2 Score 0 0 0      Social History   Tobacco Use  Smoking Status Never  Smokeless Tobacco Never   BP Readings from Last 3 Encounters:  08/12/21 140/80  04/24/21 (!) 150/80  04/10/21 (!) 141/69   Pulse Readings from Last 3 Encounters:  08/12/21 81  04/24/21 74  04/10/21 65   Wt Readings from Last 3 Encounters:  08/12/21 155 lb (70.3 kg)  08/12/21 155 lb 6.4 oz (70.5 kg)  04/24/21 152 lb 12.8 oz (69.3 kg)   BMI Readings from Last 3 Encounters:  08/12/21 25.02 kg/m  08/12/21 25.08 kg/m  04/24/21 24.66 kg/m    Assessment/Interventions: Review of patient past medical history, allergies, medications, health status, including review of consultants reports, laboratory and other test data, was performed as part of comprehensive evaluation and provision of chronic care management services.   SDOH:  (Social Determinants of Health) assessments and interventions performed: Yes   SDOH Screenings   Alcohol Screen: Not on file  Depression (PHQ2-9): Low Risk    PHQ-2 Score: 0  Financial Resource Strain: Low Risk    Difficulty of Paying Living Expenses: Not  hard at all  Food Insecurity: No Food Insecurity   Worried About Charity fundraiser in the Last Year: Never true   Ran Out of Food in the Last Year: Never true  Housing: Not on file  Physical Activity: Insufficiently Active   Days of Exercise per Week: 3 days   Minutes of Exercise per Session: 20 min  Social Connections: Not on file  Stress: No Stress Concern Present   Feeling of Stress : Not at all  Tobacco Use: Low Risk    Smoking Tobacco Use: Never   Smokeless Tobacco Use: Never   Passive Exposure: Not on file  Transportation Needs: No Transportation Needs   Lack of Transportation (Medical): No   Lack of Transportation (Non-Medical): No    CCM Care Plan  Allergies  Allergen Reactions   Meclizine Hcl     REACTION: Psychotic reaction   Tramadol Itching   Hydrocodone Itching   Omeprazole Magnesium Hives   Oxycodone Itching   Prilosec [Omeprazole Magnesium] Hives   Tramadol Hcl Itching    Medications Reviewed Today     Reviewed by Viona Gilmore, [facility] (Pharmacist) on 09/25/21 at 1446  Med List Status: <None>   Medication Order Taking? Sig Documenting Provider Last Dose Status Informant  amLODipine (NORVASC) 2.5 MG tablet [id number] Yes TAKE ONE TABLET BY MOUTH DAILY [name], [name], MD Taking Active   Ascorbic Acid 500 MG CAPS [id number]  Take 500 mg by mouth daily. [provider]  Active Self  cephALEXin (KEFLEX) 500 MG capsule [id number]  Take 500 mg by mouth daily.  [provider]  Active Self  Cholecalciferol (VITAMIN D-3) 5000 UNITS TABS [id number] Yes Take 1 tablet by mouth daily. [provider] Taking Active Self  CRANBERRY PO [id number]  Take 1 capsule by mouth in the morning and at bedtime. [provider]  Active Self  D-MANNOSE PO [id number]  Take 1 capsule by mouth in the morning and at bedtime. [provider]  Active Self  DULoxetine (CYMBALTA) 60 MG capsule [id number]  Take 1 capsule by mouth 2 (two) times daily.  [provider]  Active Self  esomeprazole (NEXIUM) 20 MG capsule [id number] Yes Take 20-40 mg by mouth daily. [provider] Taking Active Self           Med Note Caryn Section, Marylyn Ishihara A   Tue Apr 03, 2015 11:25 AM) She takes otc version  gabapentin (NEURONTIN) 300 MG capsule [id number]  Take 300 mg by mouth at bedtime. [provider]  Active Self  meloxicam (MOBIC) 15 MG tablet [id number] Yes Take 15 mg by mouth as needed for pain. [provider] Taking Active   Meth-Hyo-M Bl-Na Phos-Ph Sal (URIBEL) 118 MG CAPS [id number]  Take 1 capsule by mouth daily as needed (bladder spasms). Using PRN [provider]  Active Self  natalizumab (TYSABRI) 300 MG/15ML injection [id number] Yes Inject into the vein every 30 (thirty) days. [provider] Taking Active Self           Med Note Luther Hearing, SUSAN   Tue Feb 02, 2018  8:12 AM) Q 28 days  Probiotic Product (PROBIOTIC PEARLS) CAPS [id number]  Take 1 capsule by mouth in the morning and at bedtime. [provider]  Active Self            Patient Active Problem List   Diagnosis Date Noted   Rectal bleeding 12/05/2016   Internal hemorrhoids 12/05/2016   Hx of falling 03/17/2016   Nausea alone 02/13/2014   Other malaise and fatigue 02/13/2014   Postmenopausal estrogen deficiency 02/24/2013   Visit for preventive health examination 02/24/2013   Family hx osteoporosis 02/21/2013   Recurrent UTI 01/09/2012   Suprapubic tenderness 12/01/2011   GERD (gastroesophageal reflux disease) 10/15/2011   Dysphagia, pharyngoesophageal 10/15/2011   Voiding dysfunction 09/24/2011   Hypertriglyceridemia 02/09/2011   VITAMIN D DEFICIENCY 12/13/2008   Constipation 12/13/2008   MUSCLE SPASM 12/13/2008   DIZZINESS 12/13/2008   HYPOKALEMIA, HX OF 12/13/2008   DISORDER, ADJUSTMENT W/DEPRESSED MOOD 04/30/2007   SCLEROSIS, MULTIPLE 04/30/2007   HYPERTENSION 04/30/2007   VARICOSE VEIN, LWR EXTREMITIES W/INFLAMMATION  04/30/2007   VASOMOTOR RHINITIS 04/30/2007    Immunization History  Administered Date(s) Administered   Influenza Whole 05/07/2010, 06/06/2011   Influenza, Quadrivalent, Recombinant, Inj,  Pf 04/21/2018, 05/11/2019, 05/11/2019   Influenza,inj,Quad PF,6+ Mos 05/10/2014, 04/23/2015, 04/23/2017   Influenza-Unspecified 04/24/2017, 05/21/2020, 06/26/2021   PFIZER(Purple Top)SARS-COV-2 Vaccination 08/17/2019, 09/07/2019, 10/21/2019, 05/21/2020   Tdap 02/05/2011, 07/12/2015   Zoster Recombinat (Shingrix) 05/11/2019   Zoster, Live 05/10/2014   BP Readings from Last 3 Encounters:  08/12/21 140/80  04/24/21 (!) 150/80  04/10/21 (!) 141/69   Patient reports she has been seeing a physiotherapist who is similar to PT but she also does massage and acupuncture and reports this has been really helpful.  Patient inquired about continuing with Flonase therapy as she has been on continuously for years without breaks and doesn't feel like it's working anymore.  Conditions to be addressed/monitored:  Hypertension, GERD, Osteopenia, Allergic Rhinitis, and MS, Muscle spasms, and UTI prevention  Conditions addressed this visit: Hypertension, Osteopenia  Care Plan : CCM Pharmacy Care Plan  Updates made by Viona Gilmore, [place] since 09/25/2021 12:00 AM     Problem: Problem: Hypertension, GERD, Muscle spasms, Allergic Rhinitis, MS, UTI prevention      Long-Range Goal: Patient-Specific Goal   Start Date: 01/30/2021  Expected End Date: 01/30/2022  Recent Progress: On track  Priority: High  Note:   Current Barriers:  Unable to independently monitor therapeutic efficacy Unable to achieve control of muscle spasms   Pharmacist Clinical Goal(s):  Patient will achieve adherence to monitoring guidelines and medication adherence to achieve therapeutic efficacy maintain control of blood pressure as evidenced by home blood pressure readings  through collaboration with PharmD and provider.    Interventions: 1:1 collaboration with [name], [name], MD regarding development and update of comprehensive plan of care as evidenced by provider attestation and co-signature Inter-disciplinary care team collaboration (see longitudinal plan of care) Comprehensive medication review performed; medication list updated in electronic medical record  Hypertension (BP goal <140/90) -Uncontrolled -Current treatment: Amlodipine 2.5 mg 1 tablet daily - Appropriate, Query effective, Safe, Accessible -Medications previously tried: none  -Current home readings: does not check at home - owns a wrist cuff and it shows variable readings -Current dietary habits: seasons with salt a bit; eats very little processed foods and looks for soups that are low sodium -Current exercise habits: going to the gym twice a week -Denies hypotensive/hypertensive symptoms -Educated on BP goals and benefits of medications for prevention of heart attack, stroke and kidney damage; Importance of home blood pressure monitoring; Proper BP monitoring technique; Symptoms of hypotension and importance of maintaining adequate hydration; -Counseled to monitor BP at home weekly, document, and provide log at future appointments -Counseled on diet and exercise extensively Recommended to continue current medication Recommended for patient to purchase an arm cuff to replace the wrist cuff.  Osteopenia (Goal prevent fractures) -Uncontrolled -Last DEXA Scan: 04/28/2016 (could not locate scan from 06/22/18)  T-Score femoral neck: -1.2  T-Score total hip: n/a  T-Score lumbar spine: n/a  T-Score forearm radius: n/a  10-year probability of major osteoporotic fracture: 15.5%  10-year probability of hip fracture: 0.6% -Patient is not a candidate for pharmacologic treatment -Current treatment  Vitamin D 5000 units 1 tablet daily - Appropriate, Effective, Safe, Accessible -Medications previously tried: Reclast (never started) -Recommend  [phone] units of vitamin D daily. Recommend 1200 mg of calcium daily from dietary and supplemental sources. Recommend weight-bearing and muscle strengthening exercises for building and maintaining bone density. -Counseled on diet and exercise extensively Recommended repeat DEXA scan. Recommended supplementation with 600 mg of calcium citrate daily based on calcium intake.   Multiple sclerosis (Goal: minimize  progression and symptoms) -Controlled -Current treatment  Tysabri 300 mg/9m inject every 4 weeks - Appropriate, Effective, Safe, Accessible -Medications previously tried: Copaxone -Recommended to continue current medication  Foot pain (Goal: minimize pain) -Controlled -Current treatment  No medications -Medications previously tried: meloxicam  -Counseled on using NSAIDs sparingly and trial of Tylenol for pain due to less risk of increasing BP or bleeding while taking NSAIDs  Muscle spasms (Goal: minimize symptoms) -Not ideally controlled -Current treatment  Gabapentin 300 mg 1 capsule at bedtime - Appropriate, Effective, Safe, Accessible Duloxetine 60 mg 1 capsule twice daily - Appropriate, Effective, Safe, Accessible Meloxicam 15 mg 1 tablet as needed - Appropriate, Query effective, Safe, Accessible -Medications previously tried: baclofen, methocarbamol -Recommended to continue current medication  GERD (Goal: minimize symptoms) -Controlled -Current treatment  Esomeprazole 20 mg 1-2 capsules daily as needed - Appropriate, Effective, Safe, Accessible -Medications previously tried: none  -Counseled on long term risks of taking PPIs such as fractures, vitamin B12 deficiency and infections Patient tried to step down to every other day and had a lot of GERD symptoms.  UTI prevention (Goal: prevent UTIs) -Controlled -Current treatment  Cranberry 2 capsules daily  Vitamin C 1000 mg 1 tablet daily Probiotic daily Uribel 118 mg 1 capsule daily as needed Cephalexin 500 mg  daily -Medications previously tried: none  -Recommended to continue current medication   Health Maintenance -Vaccine gaps: COVID booster, Prevnar 20, shingrix (2nd dose) -Current therapy:  Flonase daily -Educated on Cost vs benefit of each product must be carefully weighed by individual consumer -Patient is satisfied with current therapy and denies issues -Recommended to continue current medication  Patient Goals/Self-Care Activities Patient will:  - take medications as prescribed check blood pressure weekly, document, and provide at future appointments target a minimum of 150 minutes of moderate intensity exercise weekly  Follow Up Plan: Telephone follow up appointment with care management team member scheduled for: 6 months       Medication Assistance: None required.  Patient affirms current coverage meets needs.  Compliance/Adherence/Medication fill history: Care Gaps: Prevnar 20, second dose of shingrix, mammogram, COVID booster BP- 140/80 (08/12/21)  Star-Rating Drugs: None  [id number]: [phone]: [phone]  Uses pill box? Yes - weekly (AM/PM) Pt endorses 95% compliance - 2-3 times a month (out of routine)  We discussed: Current pharmacy is preferred with insurance plan and patient is satisfied with pharmacy services Patient decided to: Continue current medication management strategy  Care Plan and Follow Up Patient Decision:  Patient agrees to Care Plan and Follow-up.  Plan: Telephone follow up appointment with care management team member scheduled for:  6 months  MJeni Salles PharmD, BChowchillaPharmacist LMount [place] [phone]

## 2021-09-25 NOTE — Patient Instructions (Signed)
Hi [name], ? ?It was great to catch up with you again!  ? ?Here are a couple of reminders of the things we talked about: ?Please try to get an arm blood pressure cuff and start checking regularly once a week at home - we want to make sure the amlodipine is working well. ?Start taking calcium citrate 500-600 mg per day to make sure you are getting enough calcium to support your bone health ?Try using a saline nasal spray and swapping out Flonase for another nasal spray like Nasacort to see if this helps. Taking a break from [name] for certain times of the year can be helpful for making sure it continues working during the times you really need it ?Don't forget to send a mychart message once you have the dates/locations of those vaccines we are missing from your history (second shingrix, pneumonia, COVID booster) ? ?Please reach out to me if you have any questions or need anything before our follow up! ? ?Best, ?Maddie ? ?[name], PharmD, BCACP ?Clinical Pharmacist ?Therapist, music at [place] ?[phone] ? ?Visit Information ? ? Goals Addressed   ?None ?  ? ?Patient Care Plan: [place]  ?  ? ?Problem Identified: Problem: Hypertension, GERD, Muscle spasms, Allergic Rhinitis, MS, UTI prevention   ?  ? ?Long-Range Goal: Patient-Specific Goal   ?Start Date: 01/30/2021  ?Expected End Date: 01/30/2022  ?Recent Progress: On track  ?Priority: High  ?Note:   ?Current Barriers:  ?Unable to independently monitor therapeutic efficacy ?Unable to achieve control of muscle spasms  ? ?Pharmacist Clinical Goal(s):  ?Patient will achieve adherence to monitoring guidelines and medication adherence to achieve therapeutic efficacy ?maintain control of blood pressure as evidenced by home blood pressure readings  through collaboration with PharmD and provider.  ? ?Interventions: ?1:1 collaboration with [name], [name], MD regarding development and update of comprehensive plan of care as evidenced by provider  attestation and co-signature ?Inter-disciplinary care team collaboration (see longitudinal plan of care) ?Comprehensive medication review performed; medication list updated in electronic medical record ? ?Hypertension (BP goal <140/90) ?-Uncontrolled ?-Current treatment: ?Amlodipine 2.5 mg 1 tablet daily - Appropriate, Query effective, Safe, Accessible ?-Medications previously tried: none  ?-Current home readings: does not check at home - owns a wrist cuff and it shows variable readings ?-Current dietary habits: seasons with salt a bit; eats very little processed foods and looks for soups that are low sodium ?-Current exercise habits: going to the gym twice a week ?-Denies hypotensive/hypertensive symptoms ?-Educated on BP goals and benefits of medications for prevention of heart attack, stroke and kidney damage; ?Importance of home blood pressure monitoring; ?Proper BP monitoring technique; ?Symptoms of hypotension and importance of maintaining adequate hydration; ?-Counseled to monitor BP at home weekly, document, and provide log at future appointments ?-Counseled on diet and exercise extensively ?Recommended to continue current medication ?Recommended for patient to purchase an arm cuff to replace the wrist cuff. ? ?Osteopenia (Goal prevent fractures) ?-Uncontrolled ?-Last DEXA Scan: 04/28/2016 (could not locate scan from 06/22/18) ? T-Score femoral neck: -1.2 ? T-Score total hip: n/a ? T-Score lumbar spine: n/a ? T-Score forearm radius: n/a ? 10-year probability of major osteoporotic fracture: 15.5% ? 10-year probability of hip fracture: 0.6% ?-Patient is not a candidate for pharmacologic treatment ?-Current treatment  ?Vitamin D 5000 units 1 tablet daily - Appropriate, Effective, Safe, Accessible ?-Medications previously tried: Reclast (never started) ?-Recommend [phone] units of vitamin D daily. Recommend 1200 mg of calcium daily from dietary and supplemental  sources. Recommend weight-bearing and muscle  strengthening exercises for building and maintaining bone density. ?-Counseled on diet and exercise extensively ?Recommended repeat DEXA scan. ?Recommended supplementation with 600 mg of calcium citrate daily based on calcium intake.  ? ?Multiple sclerosis (Goal: minimize progression and symptoms) ?-Controlled ?-Current treatment  ?Tysabri 300 mg/61mL inject every 4 weeks - Appropriate, Effective, Safe, Accessible ?-Medications previously tried: Copaxone ?-Recommended to continue current medication ? ?Foot pain (Goal: minimize pain) ?-Controlled ?-Current treatment  ?No medications ?-Medications previously tried: meloxicam  ?-Counseled on using NSAIDs sparingly and trial of Tylenol for pain due to less risk of increasing BP or bleeding while taking NSAIDs ? ?Muscle spasms (Goal: minimize symptoms) ?-Not ideally controlled ?-Current treatment  ?Gabapentin 300 mg 1 capsule at bedtime - Appropriate, Effective, Safe, Accessible ?Duloxetine 60 mg 1 capsule twice daily - Appropriate, Effective, Safe, Accessible ?Meloxicam 15 mg 1 tablet as needed - Appropriate, Query effective, Safe, Accessible ?-Medications previously tried: baclofen, methocarbamol ?-Recommended to continue current medication ? ?GERD (Goal: minimize symptoms) ?-Controlled ?-Current treatment  ?Esomeprazole 20 mg 1-2 capsules daily as needed - Appropriate, Effective, Safe, Accessible ?-Medications previously tried: none  ?-Counseled on long term risks of taking PPIs such as fractures, vitamin B12 deficiency and infections ?Patient tried to step down to every other day and had a lot of GERD symptoms. ? ?UTI prevention (Goal: prevent UTIs) ?-Controlled ?-Current treatment  ?Cranberry 2 capsules daily  ?Vitamin C 1000 mg 1 tablet daily ?Probiotic daily ?Uribel 118 mg 1 capsule daily as needed ?Cephalexin 500 mg daily ?-Medications previously tried: none  ?-Recommended to continue current medication ? ? ?Health Maintenance ?-Vaccine gaps: COVID booster,  Prevnar 20, shingrix (2nd dose) ?-Current therapy:  ?Flonase daily ?-Educated on Cost vs benefit of each product must be carefully weighed by individual consumer ?-Patient is satisfied with current therapy and denies issues ?-Recommended to continue current medication ? ?Patient Goals/Self-Care Activities ?Patient will:  ?- take medications as prescribed ?check blood pressure weekly, document, and provide at future appointments ?target a minimum of 150 minutes of moderate intensity exercise weekly ? ?Follow Up Plan: Telephone follow up appointment with care management team member scheduled for: 6 months ? ?  ?  ? ?Patient verbalizes understanding of instructions and care plan provided today and agrees to view in [place]. Active MyChart status confirmed with patient.   ?Telephone follow up appointment with pharmacy team member scheduled for: 6 months ? ?Viona Gilmore, RPH  ?  ?

## 2021-09-30 ENCOUNTER — Other Ambulatory Visit: Payer: Self-pay | Admitting: Urology

## 2021-09-30 DIAGNOSIS — M79672 Pain in left foot: Secondary | ICD-10-CM | POA: Diagnosis not present

## 2021-10-14 DIAGNOSIS — M79672 Pain in left foot: Secondary | ICD-10-CM | POA: Diagnosis not present

## 2021-10-16 ENCOUNTER — Ambulatory Visit: Payer: Medicare Other | Admitting: Internal Medicine

## 2021-10-22 ENCOUNTER — Encounter (HOSPITAL_BASED_OUTPATIENT_CLINIC_OR_DEPARTMENT_OTHER): Payer: Self-pay | Admitting: Urology

## 2021-10-22 DIAGNOSIS — G35 Multiple sclerosis: Secondary | ICD-10-CM | POA: Diagnosis not present

## 2021-10-22 LAB — CBC: RBC: 4.17 (ref 3.87–5.11)

## 2021-10-22 LAB — CBC AND DIFFERENTIAL
HCT: 37 (ref 36–46)
Hemoglobin: 12.8 (ref 12.0–16.0)
Platelets: 248 10*3/uL (ref 150–400)
WBC: 9.9

## 2021-10-23 ENCOUNTER — Encounter (HOSPITAL_BASED_OUTPATIENT_CLINIC_OR_DEPARTMENT_OTHER): Payer: Self-pay | Admitting: Urology

## 2021-10-23 ENCOUNTER — Other Ambulatory Visit: Payer: Self-pay

## 2021-10-23 DIAGNOSIS — M3500 Sicca syndrome, unspecified: Secondary | ICD-10-CM

## 2021-10-23 HISTORY — DX: Sjogren syndrome, unspecified: M35.00

## 2021-10-23 NOTE — Progress Notes (Addendum)
Spoke w/ via phone for pre-op interview---pt ?Lab needs dos---- istat              ?Lab results------ekg 04-20-2021 chart/epic, lov neurlogy dr Carlean Jews Idolina Primer 10-22-2021 chart/care everywhere [facility] 10-22-2021 , blood gases epic , mri brain 07-07-2021 care everywhere, mri cervical spine 07-07-2021 care everywhere ?COVID test -----patient states asymptomatic no test needed ?Arrive at -------1030 10-25-2021 ?NPO after MN NO Solid Food.  Clear liquids from MN until---930 am ?Med rec completed ?Medications to take morning of surgery -----Amlodipine, Nexium, Gabapentin, Cymbalta, Flonase, eye drops prn ?Diabetic medication -----n/a ?Patient instructed no nail polish to be worn day of surgery pt has light pink gel on and will try to remove 1 finger of nail polish on each hand. ?Patient instructed to bring photo id and insurance card day of surgery ?Patient aware to have Driver (ride ) / caregiver   pt riding [employer] in day of surgery driver home Altamese Dilling is spouse george  for 24 hours after surgery  ?Patient Special Instructions -----none ?Pre-Op special Istructions -----none ?Patient verbalized understanding of instructions that were given at this phone interview. ?Patient denies shortness of breath, chest pain, fever, cough at this phone interview.  ? ?Patient states blood pressure in running 160/80's and she is calling dr Regis Bill pcp today to follow up on blood pressure. ?

## 2021-10-25 ENCOUNTER — Encounter (HOSPITAL_BASED_OUTPATIENT_CLINIC_OR_DEPARTMENT_OTHER): Payer: Self-pay | Admitting: Urology

## 2021-10-25 ENCOUNTER — Ambulatory Visit (HOSPITAL_BASED_OUTPATIENT_CLINIC_OR_DEPARTMENT_OTHER)
Admission: RE | Admit: 2021-10-25 | Discharge: 2021-10-25 | Disposition: A | Discharge status: Home / Self Care | Payer: Medicare Other | Attending: Urology | Admitting: Urology

## 2021-10-25 ENCOUNTER — Encounter (HOSPITAL_BASED_OUTPATIENT_CLINIC_OR_DEPARTMENT_OTHER)
Admission: RE | Disposition: A | Discharge status: Home / Self Care | Payer: Self-pay | Source: Home / Self Care | Attending: Urology

## 2021-10-25 ENCOUNTER — Ambulatory Visit (HOSPITAL_BASED_OUTPATIENT_CLINIC_OR_DEPARTMENT_OTHER): Payer: Medicare Other | Admitting: Anesthesiology

## 2021-10-25 DIAGNOSIS — Z8669 Personal history of other diseases of the nervous system and sense organs: Secondary | ICD-10-CM | POA: Diagnosis not present

## 2021-10-25 DIAGNOSIS — Z79899 Other long term (current) drug therapy: Secondary | ICD-10-CM | POA: Diagnosis not present

## 2021-10-25 DIAGNOSIS — G35 Multiple sclerosis: Secondary | ICD-10-CM | POA: Diagnosis not present

## 2021-10-25 DIAGNOSIS — I10 Essential (primary) hypertension: Secondary | ICD-10-CM

## 2021-10-25 DIAGNOSIS — M3500 Sicca syndrome, unspecified: Secondary | ICD-10-CM | POA: Diagnosis not present

## 2021-10-25 DIAGNOSIS — N39498 Other specified urinary incontinence: Secondary | ICD-10-CM | POA: Insufficient documentation

## 2021-10-25 DIAGNOSIS — R339 Retention of urine, unspecified: Secondary | ICD-10-CM | POA: Diagnosis not present

## 2021-10-25 DIAGNOSIS — M8580 Other specified disorders of bone density and structure, unspecified site: Secondary | ICD-10-CM | POA: Diagnosis not present

## 2021-10-25 DIAGNOSIS — N319 Neuromuscular dysfunction of bladder, unspecified: Secondary | ICD-10-CM | POA: Diagnosis not present

## 2021-10-25 DIAGNOSIS — K219 Gastro-esophageal reflux disease without esophagitis: Secondary | ICD-10-CM | POA: Insufficient documentation

## 2021-10-25 HISTORY — PX: BOTOX INJECTION: SHX5754

## 2021-10-25 HISTORY — DX: Presence of spectacles and contact lenses: Z97.3

## 2021-10-25 LAB — POCT I-STAT, CHEM 8
BUN: 13 mg/dL (ref 8–23)
Calcium, Ion: 1.12 mmol/L — ABNORMAL LOW (ref 1.15–1.40)
Chloride: 102 mmol/L (ref 98–111)
Creatinine, Ser: 0.6 mg/dL (ref 0.44–1.00)
Glucose, Bld: 82 mg/dL (ref 70–99)
HCT: 38 % (ref 36.0–46.0)
Hemoglobin: 12.9 g/dL (ref 12.0–15.0)
Potassium: 4.2 mmol/L (ref 3.5–5.1)
Sodium: 140 mmol/L (ref 135–145)
TCO2: 29 mmol/L (ref 22–32)

## 2021-10-25 SURGERY — BOTOX INJECTION
Anesthesia: General | Site: Bladder

## 2021-10-25 MED ORDER — ONDANSETRON HCL 4 MG/2ML IJ SOLN
INTRAMUSCULAR | Status: DC | PRN
Start: 2021-10-25 — End: 2021-10-25
  Administered 2021-10-25: 4 mg via INTRAVENOUS

## 2021-10-25 MED ORDER — LIDOCAINE HCL (PF) 2 % IJ SOLN
INTRAMUSCULAR | Status: AC
Start: 2021-10-25 — End: ?
  Filled 2021-10-25: qty 5

## 2021-10-25 MED ORDER — ACETAMINOPHEN 500 MG PO TABS
1000.0000 mg | ORAL_TABLET | Freq: Once | ORAL | Status: AC
  Administered 2021-10-25: 1000 mg via ORAL

## 2021-10-25 MED ORDER — PHENYLEPHRINE HCL (PRESSORS) 10 MG/ML IV SOLN
INTRAVENOUS | Status: DC | PRN
  Administered 2021-10-25: 80 ug via INTRAVENOUS

## 2021-10-25 MED ORDER — DEXAMETHASONE SODIUM PHOSPHATE 10 MG/ML IJ SOLN
INTRAMUSCULAR | Status: AC
Start: 2021-10-25 — End: ?
  Filled 2021-10-25: qty 1

## 2021-10-25 MED ORDER — MIDAZOLAM HCL 2 MG/2ML IJ SOLN
INTRAMUSCULAR | Status: AC
Start: 2021-10-25 — End: ?
  Filled 2021-10-25: qty 2

## 2021-10-25 MED ORDER — VITAMIN B 12 500 MCG PO TABS
1000.0000 ug | ORAL_TABLET | Freq: Every day | ORAL | 11 refills | Status: DC
Start: 2021-10-25 — End: 2023-10-13

## 2021-10-25 MED ORDER — PROPOFOL 10 MG/ML IV BOLUS
INTRAVENOUS | Status: AC
  Filled 2021-10-25: qty 20

## 2021-10-25 MED ORDER — CEFAZOLIN SODIUM-DEXTROSE 2-4 GM/100ML-% IV SOLN
2.0000 g | Freq: Once | INTRAVENOUS | Status: AC
  Administered 2021-10-25: 2 g via INTRAVENOUS

## 2021-10-25 MED ORDER — FENTANYL CITRATE (PF) 100 MCG/2ML IJ SOLN
INTRAMUSCULAR | Status: DC | PRN
  Administered 2021-10-25 (×2): 50 ug via INTRAVENOUS

## 2021-10-25 MED ORDER — SODIUM CHLORIDE (PF) 0.9 % IJ SOLN
INTRAMUSCULAR | Status: DC | PRN
  Administered 2021-10-25: 30 mL

## 2021-10-25 MED ORDER — FENTANYL CITRATE (PF) 100 MCG/2ML IJ SOLN
INTRAMUSCULAR | Status: AC
  Filled 2021-10-25: qty 2

## 2021-10-25 MED ORDER — MIDAZOLAM HCL 2 MG/2ML IJ SOLN
INTRAMUSCULAR | Status: DC | PRN
  Administered 2021-10-25 (×2): 1 mg via INTRAVENOUS

## 2021-10-25 MED ORDER — PROPOFOL 10 MG/ML IV BOLUS
INTRAVENOUS | Status: DC | PRN
Start: 2021-10-25 — End: 2021-10-25
  Administered 2021-10-25: 150 mg via INTRAVENOUS

## 2021-10-25 MED ORDER — CEFAZOLIN SODIUM-DEXTROSE 2-4 GM/100ML-% IV SOLN
INTRAVENOUS | Status: AC
  Filled 2021-10-25: qty 100

## 2021-10-25 MED ORDER — LIDOCAINE 2% (20 MG/ML) 5 ML SYRINGE
INTRAMUSCULAR | Status: DC | PRN
  Administered 2021-10-25: 60 mg via INTRAVENOUS

## 2021-10-25 MED ORDER — LACTATED RINGERS IV SOLN: INTRAVENOUS | Status: DC

## 2021-10-25 MED ORDER — DEXAMETHASONE SODIUM PHOSPHATE 10 MG/ML IJ SOLN
INTRAMUSCULAR | Status: DC | PRN
  Administered 2021-10-25 (×2): 5 mg via INTRAVENOUS

## 2021-10-25 MED ORDER — ONDANSETRON HCL 4 MG/2ML IJ SOLN
INTRAMUSCULAR | Status: AC
  Filled 2021-10-25: qty 2

## 2021-10-25 MED ORDER — ONABOTULINUMTOXINA 100 UNITS IJ SOLR
INTRAMUSCULAR | Status: DC | PRN
Start: 2021-10-25 — End: 2021-10-25
  Administered 2021-10-25: 300 [IU] via INTRAMUSCULAR

## 2021-10-25 MED ORDER — STERILE WATER FOR IRRIGATION IR SOLN
Status: DC | PRN
  Administered 2021-10-25: 2000 mL

## 2021-10-25 MED ORDER — ACETAMINOPHEN 500 MG PO TABS
ORAL_TABLET | ORAL | Status: AC
  Filled 2021-10-25: qty 2

## 2021-10-25 SURGICAL SUPPLY — 20 items
BAG DRAIN URO-CYSTO SKYTR STRL (DRAIN) ×2 IMPLANT
BAG DRN UROCATH (DRAIN) ×1
CATH ROBINSON RED A/P 14FR (CATHETERS) IMPLANT
CLOTH BEACON ORANGE TIMEOUT ST (SAFETY) ×2 IMPLANT
ELECT REM PT RETURN 9FT ADLT (ELECTROSURGICAL)
ELECTRODE REM PT RTRN 9FT ADLT (ELECTROSURGICAL) ×1 IMPLANT
GLOVE SURG ENC MOIS LTX SZ7.5 (GLOVE) ×2 IMPLANT
GOWN STRL REUS W/TWL LRG LVL3 (GOWN DISPOSABLE) ×6 IMPLANT
KIT TURNOVER CYSTO (KITS) ×2 IMPLANT
MANIFOLD NEPTUNE II (INSTRUMENTS) ×2 IMPLANT
NDL ASPIRATION 22 (NEEDLE) IMPLANT
NDL SAFETY ECLIPSE 18X1.5 (NEEDLE) IMPLANT
NEEDLE ASPIRATION 22 (NEEDLE) ×2 IMPLANT
NEEDLE HYPO 18GX1.5 SHARP (NEEDLE) ×2
PACK CYSTO (CUSTOM PROCEDURE TRAY) ×2 IMPLANT
SYR 10ML LL (SYRINGE) ×1 IMPLANT
SYR 20ML LL LF (SYRINGE) IMPLANT
SYR CONTROL 10ML LL (SYRINGE) ×1 IMPLANT
TUBE CONNECTING 12X1/4 (SUCTIONS) ×2 IMPLANT
WATER STERILE IRR 3000ML UROMA (IV SOLUTION) ×2 IMPLANT

## 2021-10-25 NOTE — H&P (Signed)
Urology Preoperative H&P  ? ?Chief Complaint: Incontinence ? ?History of Present Illness: [name] is a 66 y.o. female with a history multiple sclerosis, refractory incontinence w/o sensation and recurrent urinary tract infections. She performs self-catheterization BID/TID when she feels suprapubic fullness/discomfort. s/p intravesical botox injections (300 units) which has well managed her incontinence.  She is here today for repeat botox injections. She denies interval UTIs, dysuria or hematuria.  ? ?Past Medical History:  ?Diagnosis Date  ? Ambulates with cane 04/08/2021  ? prn occ walker prn  ? Concussion 2019  ? no residual  ? Diverticulosis   ? GERD (gastroesophageal reflux disease)   ? Hypertension   ? MS (multiple sclerosis) ([place]) 2007  ? Self-catheterizes urinary bladder 04/08/2021  ? self caths 3 to 4 x daily  ? Sjogren's syndrome ([place]) 10/23/2021  ? runs vaporizer in room @ night to help with  ? Uses walker 04/08/2021  ? prn  ? Wears glasses for reading   ? ? ?Past Surgical History:  ?Procedure Laterality Date  ? ABDOMINAL HYSTERECTOMY  2008  ? partial  ? BLADDER SURGERY  2008  ? Tack  ? BOTOX INJECTION N/A 04/10/2021  ? Procedure: BOTOX INJECTION WITH CYSTOSCOPY;  Surgeon: Ceasar Mons, MD;  Location: [facility];  Service: Urology;  Laterality: N/A;  ? SHOULDER ARTHROSCOPY WITH ROTATOR CUFF REPAIR AND SUBACROMIAL DECOMPRESSION  06/01/2012  ? Procedure: SHOULDER ARTHROSCOPY WITH ROTATOR CUFF REPAIR AND SUBACROMIAL DECOMPRESSION;  Surgeon: Magnus Sinning, MD;  Location: [place];  Service: Orthopedics;  Laterality: Left;  LEFT SHOULDER ARTHROSCOPY WITH SUBACROMIAL DECOMPRESION AND DEBRIDEMENT OF PARTIAL ROTATOR CUFF TEAR  ? [place]  ? ? ?Allergies:  ?Allergies  ?Allergen Reactions  ? Meclizine Hcl   ?  REACTION: Psychotic reaction  ? Tramadol Itching  ? Omeprazole Magnesium Hives  ? Prilosec [Omeprazole Magnesium]  Hives  ? Tramadol Hcl Itching  ? ? ?Family History  ?Problem Relation Age of Onset  ? Lung cancer Father   ? Colon cancer Neg Hx   ? ? ?Social History:  reports that she has never smoked. She has never used smokeless tobacco. She reports current alcohol use. She reports that she does not use drugs. ? ?ROS: ?A complete review of systems was performed.  All systems are negative except for pertinent findings as noted. ? ?Physical Exam:  ?Vital signs in last 24 hours: ?Temp:  [97.9 ?F (36.6 ?C)] 97.9 ?F (36.6 ?C) (03/31 1054) ?Pulse Rate:  [79] 79 (03/31 1054) ?Resp:  [20] 20 (03/31 1054) ?BP: (148)/(76) 148/76 (03/31 1054) ?SpO2:  [99 %] 99 % (03/31 1054) ?Weight:  [70 kg] 70 kg (03/31 1054) ?Constitutional:  Alert and oriented, No acute distress ?Cardiovascular: Regular rate and rhythm, No JVD ?Respiratory: Normal respiratory effort, Lungs clear bilaterally ?GI: Abdomen is soft, nontender, nondistended, no abdominal masses ?GU: No CVA tenderness ?Lymphatic: No lymphadenopathy ?Neurologic: Grossly intact, no focal deficits ?Psychiatric: Normal mood and affect ? ?Laboratory Data:  ?Recent Labs  ?  10/25/21 ?1119  ?HGB 12.9  ?HCT 38.0  ? ? ?Recent Labs  ?  10/25/21 ?1119  ?NA 140  ?K 4.2  ?CL 102  ?GLUCOSE 82  ?BUN 13  ?CREATININE 0.60  ? ? ? ?Results for orders placed or performed during the hospital encounter of 10/25/21 (from the past 24 hour(s))  ?I-STAT, chem 8     Status: Abnormal  ? Collection Time: 10/25/21 11:19 AM  ?Result Value Ref  Range  ? Sodium 140 135 - 145 mmol/L  ? Potassium 4.2 3.5 - 5.1 mmol/L  ? Chloride 102 98 - 111 mmol/L  ? BUN 13 8 - 23 mg/dL  ? Creatinine, Ser 0.60 0.44 - 1.00 mg/dL  ? Glucose, Bld 82 70 - 99 mg/dL  ? Calcium, Ion 1.12 (L) 1.15 - 1.40 mmol/L  ? TCO2 29 22 - 32 mmol/L  ? Hemoglobin 12.9 12.0 - 15.0 g/dL  ? HCT 38.0 36.0 - 46.0 %  ? ?No results found for this or any previous visit (from the past 240 hour(s)). ? ?Renal Function: ?Recent Labs  ?  10/25/21 ?1119  ?CREATININE 0.60   ? ?Estimated Creatinine Clearance: 63.9 mL/min (by C-G formula based on SCr of 0.6 mg/dL). ? ?Radiologic Imaging: ?No results found. ? ?I independently reviewed the above imaging studies. ? ?Assessment and Plan ?[name] is a 66 y.o. female with refractory incontinence due to multiple sclerosis ? ?-The risks, benefits and alternatives of cystoscopy with botox injections was discussed with the patient.  She voices understanding and wishes to proceed.   ? ? ?Ellison Hughs, MD ?10/25/2021, 12:21 PM  ?[facility] ?Pager: [phone] ? ?

## 2021-10-25 NOTE — Discharge Instructions (Signed)

## 2021-10-25 NOTE — Transfer of Care (Signed)
Immediate Anesthesia Transfer of Care Note ? ?Patient: [name] ? ?Procedure(s) Performed: Procedure(s) (LRB): ?CYSTOSCOPY WITH BOTOX INJECTION 300 UNITS (N/A) ? ?Patient Location: PACU ? ?Anesthesia Type: General ? ?Level of Consciousness: awake, oriented, sedated and patient cooperative ? ?Airway & Oxygen Therapy: Patient Spontanous Breathing and Patient connected to face mask oxygen ? ?Post-op Assessment: Report given to PACU RN and Post -op Vital signs reviewed and stable ? ?Post vital signs: Reviewed and stable ? ?Complications: No apparent anesthesia complications ? ?Last Vitals:  ?Vitals Value Taken Time  ?BP    ?Temp    ?Pulse    ?Resp    ?SpO2    ? ? ?Last Pain:  ?Vitals:  ? 10/25/21 1054  ?TempSrc: Oral  ?PainSc: 0-No pain  ?   ? ?Patients Stated Pain Goal: 6 (10/25/21 1054) ? ?Complications: No notable events documented. ?

## 2021-10-25 NOTE — Anesthesia Postprocedure Evaluation (Signed)
Anesthesia Post Note ? ?Patient: [name] ? ?Procedure(s) Performed: CYSTOSCOPY WITH BOTOX INJECTION 300 UNITS (Bladder) ? ?  ? ?Patient location during evaluation: PACU ?Anesthesia Type: General ?Level of consciousness: awake and alert, oriented and patient cooperative ?Pain management: pain level controlled ?Vital Signs Assessment: post-procedure vital signs reviewed and stable ?Respiratory status: spontaneous breathing, nonlabored ventilation and respiratory function stable ?Cardiovascular status: blood pressure returned to baseline and stable ?Postop Assessment: no apparent nausea or vomiting ?Anesthetic complications: no ? ? ?No notable events documented. ? ?Last Vitals:  ?Vitals:  ? 10/25/21 1325 10/25/21 1330  ?BP:    ?Pulse:  77  ?Resp:  12  ?Temp: (!) 36.3 ?C   ?SpO2:  100%  ?  ?Last Pain:  ?Vitals:  ? 10/25/21 1330  ?TempSrc:   ?PainSc: 0-No pain  ? ? ?  ?  ?  ?  ?  ?  ? ?Jarome Matin Jennessy Sandridge ? ? ? ? ?

## 2021-10-25 NOTE — Anesthesia Procedure Notes (Signed)
Procedure Name: LMA Insertion ?Date/Time: 10/25/2021 12:43 PM ?Performed by: Suan Halter, CRNA ?Pre-anesthesia Checklist: Patient identified, Emergency Drugs available, Suction available and Patient being monitored ?Patient Re-evaluated:Patient Re-evaluated prior to induction ?Oxygen Delivery Method: Circle system utilized ?Preoxygenation: Pre-oxygenation with 100% oxygen ?Induction Type: IV induction ?Ventilation: Mask ventilation without difficulty ?LMA: LMA inserted ?LMA Size: 4.0 ?Number of attempts: 1 ?Airway Equipment and Method: Bite block ?Placement Confirmation: positive ETCO2 ?Tube secured with: Tape ?Dental Injury: Teeth and Oropharynx as per pre-operative assessment  ? ? ? ? ?

## 2021-10-25 NOTE — Anesthesia Preprocedure Evaluation (Addendum)
Anesthesia Evaluation  ?Patient identified by MRN, date of birth, ID band ?Patient awake ? ? ? ?Reviewed: ?Allergy & Precautions, NPO status , Patient's Chart, lab work & pertinent test results ? ?History of Anesthesia Complications ?Negative for: history of anesthetic complications ? ?Airway ?Mallampati: II ? ?TM Distance: >3 FB ?Neck ROM: Full ? ? ? Dental ? ?(+) Teeth Intact, Dental Advisory Given ?  ?Pulmonary ?neg pulmonary ROS,  ?  ?Pulmonary exam normal ?breath sounds clear to auscultation ? ? ? ? ? ? Cardiovascular ?hypertension, Pt. on medications ?Normal cardiovascular exam ?Rhythm:Regular Rate:Normal ? ? ?  ?Neuro/Psych ? Neuromuscular disease (MS ) negative psych ROS  ? GI/Hepatic ?Neg liver ROS, GERD  Controlled and Medicated,  ?Endo/Other  ?negative endocrine ROS ? Renal/GU ?negative Renal ROS Bladder dysfunction (neurogenic bladder, self catheterizes ) ? ? ? ?  ?Musculoskeletal ?negative musculoskeletal ROS ?(+)  ? Abdominal ?  ?Peds ? Hematology ?negative hematology ROS ?(+)   ?Anesthesia Other Findings ?Ambulates w/ cane, walker  ? ?sjogrens  ? Reproductive/Obstetrics ?negative OB ROS ? ?  ? ? ? ? ? ? ? ? ? ? ? ? ? ?  ?  ? ? ? ? ? ? ?Anesthesia Physical ?Anesthesia Plan ? ?ASA: 2 ? ?Anesthesia Plan: General  ? ?Post-op Pain Management: Tylenol PO (pre-op)*  ? ?Induction: Intravenous ? ?PONV Risk Score and Plan: 4 or greater and Ondansetron, Dexamethasone, Midazolam and Treatment may vary due to age or medical condition ? ?Airway Management Planned: LMA ? ?Additional Equipment: None ? ?Intra-op Plan:  ? ?Post-operative Plan: Extubation in OR ? ?Informed Consent: I have reviewed the patients History and Physical, chart, labs and discussed the procedure including the risks, benefits and alternatives for the proposed anesthesia with the patient or authorized representative who has indicated his/her understanding and acceptance.  ? ? ? ?Dental advisory given ? ?Plan  Discussed with: CRNA ? ?Anesthesia Plan Comments:   ? ? ? ? ? ?Anesthesia Quick Evaluation ? ?

## 2021-10-25 NOTE — Op Note (Signed)
Operative Note ? ?Preoperative diagnosis:  ?1.  Neurogenic bladder with incontinence  ?2.  History of multiple sclerosis ? ?Postoperative diagnosis: ?1.  Neurogenic bladder with incontinence  ?2.  History of multiple sclerosis ? ?Procedure(s): ?1.  Cystoscopy with intravesical Botox injections (300 units) ? ?Surgeon: Ellison Hughs, MD ? ?Assistants:  None ? ?Anesthesia:  General ? ?Complications:  None ? ?EBL: Less than 5 mL ? ?Specimens: ?1.  None ? ?Drains/Catheters: ?1.  None ? ?Intraoperative findings:   ?No intravesical lesions were identified ? ?Indication:  [name] is a 66 y.o. female with a history of multiple sclerosis resulting in a neurogenic bladder and refractory incontinence.  She is here today for repeat Botox injections as this has significantly decreased her incontinence episodes.  She has been consented for the above procedures, voices understanding and wishes to proceed. ? ?Description of procedure: ? ?After informed consent was obtained, the patient was brought to the operating room and general LMA anesthesia was administered. The patient was then placed in the dorsolithotomy position and prepped and draped in the usual sterile fashion. A timeout was performed. A 23 French rigid cystoscope was then inserted into the urethral meatus and advanced into the bladder under direct vision. A complete bladder survey revealed no intravesical pathology. ? ?A total of 300 units of Botox were then injected intravesically in 5 mL aliquots in a grid like fashion throughout the bladder.  There was no significant bleeding following the injection of Botox.  Her bladder was drained.  She tolerated the procedure well and was transferred to the postanesthesia in stable condition. ? ?Plan: Discharge home ? ?

## 2021-10-28 ENCOUNTER — Encounter (HOSPITAL_BASED_OUTPATIENT_CLINIC_OR_DEPARTMENT_OTHER): Payer: Self-pay | Admitting: Urology

## 2021-10-28 DIAGNOSIS — M79672 Pain in left foot: Secondary | ICD-10-CM | POA: Diagnosis not present

## 2021-11-08 DIAGNOSIS — M79672 Pain in left foot: Secondary | ICD-10-CM | POA: Diagnosis not present

## 2021-11-11 NOTE — Progress Notes (Deleted)
No chief complaint on file.   HPI: [name] 66 y.o. come in for Chronic disease management   bp ROS: See pertinent positives and negatives per HPI.  Past Medical History:  Diagnosis Date   Ambulates with cane 04/08/2021   prn occ walker prn   Concussion 2019   no residual   Diverticulosis    GERD (gastroesophageal reflux disease)    Hypertension    MS (multiple sclerosis) ([place]) 2007   Self-catheterizes urinary bladder 04/08/2021   self caths 3 to 4 x daily   Sjogren's syndrome ([place]) 10/23/2021   runs vaporizer in room @ night to help with   Uses walker 04/08/2021   prn   Wears glasses for reading     Family History  Problem Relation Age of Onset   Lung cancer Father    Colon cancer Neg Hx     Social History   Socioeconomic History   Marital status: Married    Spouse name: Not on file   Number of children: 4   Years of education: Not on file   Highest education level: Not on file  Occupational History   Occupation: Disability  Tobacco Use   Smoking status: Never   Smokeless tobacco: Never  Vaping Use   Vaping Use: Never used  Substance and Sexual Activity   Alcohol use: Yes    Comment: 2 glasses wine per day   Drug use: No   Sexual activity: Not Currently  Other Topics Concern   Not on file  Social History Narrative   Unable to volunteer    Now on disability   Children now out of home   Daily caffeine    Social Determinants of Health   Financial Resource Strain: Low Risk    Difficulty of Paying Living Expenses: Not hard at all  Food Insecurity: No Food Insecurity   Worried About Charity fundraiser in the Last Year: Never true   Ran Out of Food in the Last Year: Never true  Transportation Needs: No Transportation Needs   Lack of Transportation (Medical): No   Lack of Transportation (Non-Medical): No  Physical Activity: Insufficiently Active   Days of Exercise per Week: 3 days   Minutes of Exercise per Session: 20 min  Stress: No  Stress Concern Present   Feeling of Stress : Not at all  Social Connections: Not on file    Outpatient Medications Prior to Visit  Medication Sig Dispense Refill   amLODipine (NORVASC) 2.5 MG tablet TAKE ONE TABLET BY MOUTH DAILY 90 tablet 3   antiseptic oral rinse (BIOTENE) LIQD 15 mLs by Mouth Rinse route as needed for dry mouth.     Ascorbic Acid 500 MG CAPS Take 500 mg by mouth daily.     Biotin 10000 MCG TABS Take by mouth daily at 6 (six) AM. 1000 mg daily     cephALEXin (KEFLEX) 500 MG capsule Take 500 mg by mouth at bedtime.     Cholecalciferol (VITAMIN D-3) 5000 UNITS TABS Take 1 tablet by mouth daily. 2000 units daily     CRANBERRY PO Take 1 capsule by mouth in the morning and at bedtime. 500 mg     Cyanocobalamin (VITAMIN B 12) 500 MCG TABS Take 1,000 mcg by mouth daily. 30 tablet 11   D-MANNOSE PO Take 1 capsule by mouth in the morning and at bedtime.     DULoxetine (CYMBALTA) 60 MG capsule Take 1 capsule by mouth 2 (two) times daily.  esomeprazole (NEXIUM) 20 MG capsule Take 20 mg by mouth daily.     fluticasone (FLONASE) 50 MCG/ACT nasal spray Place 1 spray into both nostrils daily.     gabapentin (NEURONTIN) 300 MG capsule Take 300 mg by mouth at bedtime. Started trial on 10-22-2021 of 100 mg gabapentin in morning     Meth-Hyo-M Bl-Na Phos-Ph Sal (URIBEL) 118 MG CAPS Take 1 capsule by mouth daily as needed (bladder spasms). Using PRN     natalizumab (TYSABRI) 300 MG/15ML injection Inject into the vein every 30 (thirty) days.     Polyethyl Glycol-Propyl Glycol 0.4-0.3 % SOLN Apply to eye. Systane dry eye drops prn     Probiotic Product (PROBIOTIC PEARLS) CAPS Take 1 capsule by mouth in the morning and at bedtime.     White Petrolatum-Mineral Oil (SYSTANE NIGHTTIME) OINT Apply to eye at bedtime.     No facility-administered medications prior to visit.     EXAM:  There were no vitals taken for this visit.  There is no height or weight on file to calculate  BMI.  GENERAL: vitals reviewed and listed above, alert, oriented, appears well hydrated and in no acute distress HEENT: atraumatic, conjunctiva  clear, no obvious abnormalities on inspection of external nose and ears OP : no lesion edema or exudate  NECK: no obvious masses on inspection palpation  LUNGS: clear to auscultation bilaterally, no wheezes, rales or rhonchi, good air movement CV: HRRR, no clubbing cyanosis or  peripheral edema nl cap refill  MS: moves all extremities without noticeable focal  abnormality PSYCH: pleasant and cooperative, no obvious depression or anxiety Lab Results  Component Value Date   WBC 12.4 09/24/2021   HGB 12.9 10/25/2021   HCT 38.0 10/25/2021   PLT 249 09/24/2021   GLUCOSE 82 10/25/2021   CHOL 172 04/24/2021   TRIG 112.0 04/24/2021   HDL 55.80 04/24/2021   LDLDIRECT 110.0 04/23/2015   LDLCALC 94 04/24/2021   ALT 20 09/24/2021   AST 17 09/24/2021   NA 140 10/25/2021   K 4.2 10/25/2021   CL 102 10/25/2021   CREATININE 0.60 10/25/2021   BUN 13 10/25/2021   CO2 29 (A) 09/24/2021   TSH 1.12 04/24/2021   HGBA1C 5.2 02/13/2014   BP Readings from Last 3 Encounters:  10/25/21 (!) 142/62  08/12/21 140/80  04/24/21 (!) 150/80    ASSESSMENT AND PLAN:  Discussed the following assessment and plan:  Essential hypertension  Medication management  -Patient advised to return or notify health care team  if  new concerns arise.  There are no Patient Instructions on file for this visit.   [name]. Carlethia Mesquita M.D.

## 2021-11-12 ENCOUNTER — Ambulatory Visit: Payer: Medicare Other | Admitting: Internal Medicine

## 2021-11-12 DIAGNOSIS — I10 Essential (primary) hypertension: Secondary | ICD-10-CM

## 2021-11-12 DIAGNOSIS — Z79899 Other long term (current) drug therapy: Secondary | ICD-10-CM

## 2021-11-20 DIAGNOSIS — G35 Multiple sclerosis: Secondary | ICD-10-CM | POA: Diagnosis not present

## 2021-11-20 DIAGNOSIS — Z79899 Other long term (current) drug therapy: Secondary | ICD-10-CM | POA: Diagnosis not present

## 2021-11-20 DIAGNOSIS — M79605 Pain in left leg: Secondary | ICD-10-CM | POA: Diagnosis not present

## 2021-11-22 ENCOUNTER — Encounter: Payer: Self-pay | Admitting: Internal Medicine

## 2021-11-22 DIAGNOSIS — M79672 Pain in left foot: Secondary | ICD-10-CM | POA: Diagnosis not present

## 2021-11-26 DIAGNOSIS — M85851 Other specified disorders of bone density and structure, right thigh: Secondary | ICD-10-CM | POA: Diagnosis not present

## 2021-11-26 DIAGNOSIS — Z1231 Encounter for screening mammogram for malignant neoplasm of breast: Secondary | ICD-10-CM | POA: Diagnosis not present

## 2021-11-26 DIAGNOSIS — M85852 Other specified disorders of bone density and structure, left thigh: Secondary | ICD-10-CM | POA: Diagnosis not present

## 2021-11-26 DIAGNOSIS — M810 Age-related osteoporosis without current pathological fracture: Secondary | ICD-10-CM | POA: Diagnosis not present

## 2021-11-26 LAB — HM MAMMOGRAPHY

## 2021-11-27 NOTE — Progress Notes (Signed)
? ?Office Visit Note ? ?Patient: [name]             ?Date of Birth: 03-Dec-1955           ?MRN: [id number]             ?PCP: Burnis Medin, MD ?Referring: Burnis Medin, MD ?Visit Date: 11/28/2021 ? ? ?Subjective:  ? ?History of Present Illness: [name] is a 66 y.o. female here for evaluation of abnormal labs associated with skin changes and swelling. She started to have episodic discoloration in her toes and feet with white or purple appearance coming and going. She most often notices symptoms in the early mornings. Also developed similar changes in fingers of both hands. No residual erythema or changes in the affected areas. She notices numbness during episodes no particular amount of pain. She also has increased lower leg and ankle swelling worst at the end of the day. She has a history of MS well controlled for years on natalizumab monthly infusions.  ? ?Labs reviewed ?ANA 1:320 nucleolar ?CRP <1 ? ?Activities of Daily Living:  ?Patient reports morning stiffness for 10 minutes.   ?Patient Reports nocturnal pain.  ?Difficulty dressing/grooming: Denies ?Difficulty climbing stairs: Reports ?Difficulty getting out of chair: Reports ?Difficulty using hands for taps, buttons, cutlery, and/or writing: Denies ? ?Review of Systems  ?Constitutional:  Positive for fatigue.  ?HENT:  Positive for mouth dryness and nose dryness. Negative for mouth sores.   ?Eyes:  Positive for dryness. Negative for pain and itching.  ?Respiratory:  Negative for shortness of breath and difficulty breathing.   ?Cardiovascular:  Negative for chest pain and palpitations.  ?Gastrointestinal:  Negative for blood in stool, constipation and diarrhea.  ?Endocrine: Negative for increased urination.  ?Genitourinary:  Negative for difficulty urinating.  ?Musculoskeletal:  Positive for myalgias, morning stiffness, muscle tenderness and myalgias. Negative for joint pain, joint pain and joint swelling.  ?Skin:  Positive for  color change. Negative for rash and redness.  ?Allergic/Immunologic: Negative for susceptible to infections.  ?Neurological:  Positive for numbness and weakness. Negative for dizziness, headaches and memory loss.  ?Hematological:  Positive for bruising/bleeding tendency.  ?Psychiatric/Behavioral:  Negative for confusion.   ? ?PMFS History:  ?Patient Active Problem List  ? Diagnosis Date Noted  ? Positive ANA (antinuclear antibody) 11/28/2021  ? Discoloration of skin of toe 11/28/2021  ? Rectal bleeding 12/05/2016  ? Internal hemorrhoids 12/05/2016  ? Hx of falling 03/17/2016  ? Nausea alone 02/13/2014  ? Other malaise and fatigue 02/13/2014  ? Postmenopausal estrogen deficiency 02/24/2013  ? Visit for preventive health examination 02/24/2013  ? Family hx osteoporosis 02/21/2013  ? Recurrent UTI 01/09/2012  ? Suprapubic tenderness 12/01/2011  ? GERD (gastroesophageal reflux disease) 10/15/2011  ? Dysphagia, pharyngoesophageal 10/15/2011  ? Voiding dysfunction 09/24/2011  ? Hypertriglyceridemia 02/09/2011  ? VITAMIN D DEFICIENCY 12/13/2008  ? Constipation 12/13/2008  ? MUSCLE SPASM 12/13/2008  ? DIZZINESS 12/13/2008  ? HYPOKALEMIA, HX OF 12/13/2008  ? DISORDER, ADJUSTMENT W/DEPRESSED MOOD 04/30/2007  ? SCLEROSIS, MULTIPLE 04/30/2007  ? HYPERTENSION 04/30/2007  ? VARICOSE VEIN, LWR EXTREMITIES W/INFLAMMATION 04/30/2007  ? VASOMOTOR RHINITIS 04/30/2007  ?  ?Past Medical History:  ?Diagnosis Date  ? Ambulates with cane 04/08/2021  ? prn occ walker prn  ? Concussion 2019  ? no residual  ? Diverticulosis   ? GERD (gastroesophageal reflux disease)   ? Hypertension   ? MS (multiple sclerosis) ([place]) 2007  ? Self-catheterizes urinary bladder 04/08/2021  ?  self caths 3 to 4 x daily  ? Sjogren's syndrome ([place]) 10/23/2021  ? runs vaporizer in room @ night to help with  ? Uses walker 04/08/2021  ? prn  ? Wears glasses for reading   ?  ?Family History  ?Problem Relation Age of Onset  ? Lung cancer Father   ? Colon cancer Neg Hx    ? ?Past Surgical History:  ?Procedure Laterality Date  ? ABDOMINAL HYSTERECTOMY  2008  ? partial  ? BLADDER SURGERY  2008  ? Tack  ? BOTOX INJECTION N/A 04/10/2021  ? Procedure: BOTOX INJECTION WITH CYSTOSCOPY;  Surgeon: Ceasar Mons, MD;  Location: [facility];  Service: Urology;  Laterality: N/A;  ? BOTOX INJECTION N/A 10/25/2021  ? Procedure: CYSTOSCOPY WITH BOTOX INJECTION 300 UNITS;  Surgeon: Ceasar Mons, MD;  Location: [facility];  Service: Urology;  Laterality: N/A;  ? CATARACT EXTRACTION Left   ? SHOULDER ARTHROSCOPY WITH ROTATOR CUFF REPAIR AND SUBACROMIAL DECOMPRESSION  06/01/2012  ? Procedure: SHOULDER ARTHROSCOPY WITH ROTATOR CUFF REPAIR AND SUBACROMIAL DECOMPRESSION;  Surgeon: Magnus Sinning, MD;  Location: [place];  Service: Orthopedics;  Laterality: Left;  LEFT SHOULDER ARTHROSCOPY WITH SUBACROMIAL DECOMPRESION AND DEBRIDEMENT OF PARTIAL ROTATOR CUFF TEAR  ? [place]  ? ?Social History  ? ?Social History Narrative  ? Unable to volunteer   ? Now on disability  ? Children now out of home  ? Daily caffeine   ? ?Immunization History  ?Administered Date(s) Administered  ? Influenza Whole 05/07/2010, 06/06/2011  ? Influenza, Quadrivalent, Recombinant, Inj, Pf 04/21/2018, 05/11/2019, 05/11/2019  ? Influenza,inj,Quad PF,6+ Mos 05/10/2014, 04/23/2015, 04/23/2017  ? Influenza-Unspecified 04/24/2017, 05/21/2020, 06/26/2021  ? PFIZER(Purple Top)SARS-COV-2 Vaccination 08/17/2019, 09/07/2019, 10/21/2019, 05/21/2020  ? Tdap 02/05/2011, 07/12/2015  ? Zoster Recombinat (Shingrix) 05/11/2019  ? Zoster, Live 05/10/2014  ?  ? ?Objective: ?Vital Signs: BP (!) 152/88 (BP Location: Right Arm, Patient Position: Sitting, Cuff Size: Normal)   Pulse 82   Ht '5\' 5"'$  (1.651 m)   Wt 156 lb 6.4 oz (70.9 kg)   BMI 26.03 kg/m?   ? ?Physical Exam ?HENT:  ?   Mouth/Throat:  ?   Mouth: Mucous membranes are moist.  ?   Pharynx:  Oropharynx is clear.  ?Eyes:  ?   Conjunctiva/sclera: Conjunctivae normal.  ?Cardiovascular:  ?   Rate and Rhythm: Normal rate and regular rhythm.  ?Pulmonary:  ?   Effort: Pulmonary effort is normal.  ?   Breath sounds: Normal breath sounds.  ?Musculoskeletal:  ?   Comments: Trace pedal edema  ?Lymphadenopathy:  ?   Cervical: No cervical adenopathy.  ?Skin: ?   General: Skin is warm and dry.  ?   Comments: Tortuous superficial veins at lower legs and ankles, no erythema or lesions ?Normal appearing nailfold capillaroscopy  ?Neurological:  ?   Mental Status: She is alert.  ?Psychiatric:     ?   Mood and Affect: Mood normal.  ?  ? ?Musculoskeletal Exam:  ?Neck full ROM no tenderness ?Shoulders full ROM no tenderness or swelling ?Elbows full ROM no tenderness or swelling ?Wrists full ROM no tenderness or swelling ?Fingers full ROM no tenderness or swelling ?Left lateral hip tenderness to pressure, full ROM ?Knees full ROM patellofemoral crepitus present, some posterior joint pain on left leg with internal rotation ?Ankles full ROM no tenderness or swelling ?MTPs mild lateral deviation and decreased flexion and extension ROM in several toes of both  feet ? ? ?Investigation: ?No additional findings. ? ?Imaging: ?No results found. ? ?Recent Labs: ?Lab Results  ?Component Value Date  ? WBC 9.9 10/22/2021  ? HGB 12.9 10/25/2021  ? PLT 248 10/22/2021  ? NA 140 10/25/2021  ? K 4.2 10/25/2021  ? CL 102 10/25/2021  ? CO2 29 (A) 09/24/2021  ? GLUCOSE 82 10/25/2021  ? BUN 13 10/25/2021  ? CREATININE 0.60 10/25/2021  ? BILITOT 0.6 08/12/2021  ? ALKPHOS 87 09/24/2021  ? AST 17 09/24/2021  ? ALT 20 09/24/2021  ? PROT 6.4 08/12/2021  ? ALBUMIN 4.4 09/24/2021  ? CALCIUM 9.1 09/24/2021  ? GFRAA >60 04/03/2015  ? ? ?Speciality Comments: No specialty comments available. ? ?Procedures:  ?No procedures performed ?Allergies: Meclizine hcl, Tramadol, Omeprazole magnesium, Prilosec [omeprazole magnesium], and Tramadol hcl  ? ?Assessment /  Plan:     ?Visit Diagnoses: Positive ANA (antinuclear antibody) - Plan: Rheumatoid factor, Sjogrens syndrome-A extractable nuclear antibody, Sjogrens syndrome-B extractable nuclear antibody, C3 and C4, Anti-Smith

## 2021-11-28 ENCOUNTER — Encounter: Payer: Self-pay | Admitting: Internal Medicine

## 2021-11-28 ENCOUNTER — Ambulatory Visit: Payer: Medicare Other | Admitting: Internal Medicine

## 2021-11-28 VITALS — BP 152/88 | HR 82 | Ht 65.0 in | Wt 156.4 lb

## 2021-11-28 DIAGNOSIS — L819 Disorder of pigmentation, unspecified: Secondary | ICD-10-CM | POA: Diagnosis not present

## 2021-11-28 DIAGNOSIS — G35 Multiple sclerosis: Secondary | ICD-10-CM

## 2021-11-28 DIAGNOSIS — I8310 Varicose veins of unspecified lower extremity with inflammation: Secondary | ICD-10-CM

## 2021-11-28 DIAGNOSIS — R768 Other specified abnormal immunological findings in serum: Secondary | ICD-10-CM

## 2021-11-30 LAB — SJOGRENS SYNDROME-B EXTRACTABLE NUCLEAR ANTIBODY: SSB (La) (ENA) Antibody, IgG: <1 AI

## 2021-11-30 LAB — ANTI-DNA ANTIBODY, DOUBLE-STRANDED: ds DNA Ab: <1 IU/mL

## 2021-11-30 LAB — ANTI-SMITH ANTIBODY: ENA SM Ab Ser-aCnc: <1 AI

## 2021-11-30 LAB — SJOGRENS SYNDROME-A EXTRACTABLE NUCLEAR ANTIBODY: SSA (Ro) (ENA) Antibody, IgG: <1 AI

## 2021-11-30 LAB — C3 AND C4
C3 Complement: 134 mg/dL (ref 83–193)
C4 Complement: 38 mg/dL (ref 15–57)

## 2021-11-30 LAB — RHEUMATOID FACTOR: Rheumatoid fact SerPl-aCnc: <14 IU/mL (ref <14)

## 2021-12-01 NOTE — Progress Notes (Signed)
Lab results are all negative for these more specific antibody markers. So I don't think she needs to start any new medication right now for this. We can keep scheduled follow up plan in 6-9 months or sooner if symptoms worsen.

## 2021-12-02 DIAGNOSIS — R339 Retention of urine, unspecified: Secondary | ICD-10-CM | POA: Diagnosis not present

## 2021-12-04 ENCOUNTER — Ambulatory Visit (INDEPENDENT_AMBULATORY_CARE_PROVIDER_SITE_OTHER): Payer: Medicare Other | Admitting: Internal Medicine

## 2021-12-04 ENCOUNTER — Encounter: Payer: Self-pay | Admitting: Internal Medicine

## 2021-12-04 VITALS — BP 150/80 | HR 79 | Temp 98.5°F | Ht 65.0 in | Wt 156.6 lb

## 2021-12-04 DIAGNOSIS — G35 Multiple sclerosis: Secondary | ICD-10-CM

## 2021-12-04 DIAGNOSIS — M25562 Pain in left knee: Secondary | ICD-10-CM | POA: Diagnosis not present

## 2021-12-04 DIAGNOSIS — I10 Essential (primary) hypertension: Secondary | ICD-10-CM

## 2021-12-04 DIAGNOSIS — Z79899 Other long term (current) drug therapy: Secondary | ICD-10-CM | POA: Diagnosis not present

## 2021-12-04 MED ORDER — VALSARTAN 80 MG PO TABS: 80.0000 mg | ORAL_TABLET | Freq: Every day | ORAL | 3 refills | Status: DC

## 2021-12-04 NOTE — Progress Notes (Signed)
? ?Chief Complaint  ?Patient presents with  ? Knee Pain  ?  Left started a while ago worst last 10 days   ? ? ?HPI: ?[name] 66 y.o. come in for   2 issues  ? ?Left leg pain and knee painful lateral and posterior knee pain and  cracking   feels unstable  ?Knee  began hurting about  3 weeks ago and then 10 days much more painful   no injury   more weight bearing left than right cause of weakness  R from ms.  Hard to get up from chair now.     Neuro alos saw her   was given prednisone x a week and no help .  Took 2 aleve but got esophageal sx reflux cough   tylenol no help[.  No fall   has cracking and knee feels unstable    cold no help  heat?   ?Uses rolling walker for ambulation ? ?Bp readings  up  150 range  was 170 at neuro office   only on amlodipine 2.5 ( had swelling with 5 mg)    ? ?ROS: See pertinent positives and negatives per HPI. ?Had recent rheum eval for see last notes and no autoimmune disease dx  ?Past Medical History:  ?Diagnosis Date  ? Ambulates with cane 04/08/2021  ? prn occ walker prn  ? Concussion 2019  ? no residual  ? Diverticulosis   ? GERD (gastroesophageal reflux disease)   ? Hypertension   ? MS (multiple sclerosis) ([place]) 2007  ? Self-catheterizes urinary bladder 04/08/2021  ? self caths 3 to 4 x daily  ? Sjogren's syndrome ([place]) 10/23/2021  ? runs vaporizer in room @ night to help with  ? Uses walker 04/08/2021  ? prn  ? Wears glasses for reading   ? ? ?Family History  ?Problem Relation Age of Onset  ? Lung cancer Father   ? Colon cancer Neg Hx   ? ? ?Social History  ? ?Socioeconomic History  ? Marital status: Married  ?  Spouse name: Not on file  ? Number of children: 4  ? Years of education: Not on file  ? Highest education level: Not on file  ?Occupational History  ? Occupation: Disability  ?Tobacco Use  ? Smoking status: Never  ?  Passive exposure: Never  ? Smokeless tobacco: Never  ?Vaping Use  ? Vaping Use: Never used  ?Substance and Sexual Activity  ? Alcohol use: Yes   ?  Comment: 2 glasses wine per day  ? Drug use: No  ? Sexual activity: Not Currently  ?Other Topics Concern  ? Not on file  ?Social History Narrative  ? Unable to volunteer   ? Now on disability  ? Children now out of home  ? Daily caffeine   ? ?Social Determinants of Health  ? ?Financial Resource Strain: Low Risk   ? Difficulty of Paying Living Expenses: Not hard at all  ?Food Insecurity: No Food Insecurity  ? Worried About Charity fundraiser in the Last Year: Never true  ? Ran Out of Food in the Last Year: Never true  ?Transportation Needs: No Transportation Needs  ? Lack of Transportation (Medical): No  ? Lack of Transportation (Non-Medical): No  ?Physical Activity: Insufficiently Active  ? Days of Exercise per Week: 3 days  ? Minutes of Exercise per Session: 20 min  ?Stress: No Stress Concern Present  ? Feeling of Stress : Not at all  ?Social Connections:  Not on file  ? ? ?Outpatient Medications Prior to Visit  ?Medication Sig Dispense Refill  ? amLODipine (NORVASC) 2.5 MG tablet TAKE ONE TABLET BY MOUTH DAILY 90 tablet 3  ? antiseptic oral rinse (BIOTENE) LIQD 15 mLs by Mouth Rinse route as needed for dry mouth.    ? Ascorbic Acid 500 MG CAPS Take 500 mg by mouth daily.    ? Biotin 10000 MCG TABS Take by mouth daily at 6 (six) AM. 1000 mg daily    ? cephALEXin (KEFLEX) 500 MG capsule Take 500 mg by mouth at bedtime.    ? Cholecalciferol (VITAMIN D-3) 5000 UNITS TABS Take 1 tablet by mouth daily. 2000 units daily    ? CRANBERRY PO Take 1 capsule by mouth in the morning and at bedtime. 500 mg    ? Cyanocobalamin (VITAMIN B 12) 500 MCG TABS Take 1,000 mcg by mouth daily. 30 tablet 11  ? D-MANNOSE PO Take 1 capsule by mouth in the morning and at bedtime.    ? DULoxetine (CYMBALTA) 60 MG capsule Take 1 capsule by mouth 2 (two) times daily.    ? esomeprazole (NEXIUM) 20 MG capsule Take 20 mg by mouth daily.    ? fluticasone (FLONASE) 50 MCG/ACT nasal spray Place 1 spray into both nostrils daily.    ? gabapentin  (NEURONTIN) 300 MG capsule Take 300 mg by mouth at bedtime. Started trial on 10-22-2021 of 100 mg gabapentin in morning    ? Meth-Hyo-M Bl-Na Phos-Ph Sal (URIBEL) 118 MG CAPS Take 1 capsule by mouth daily as needed (bladder spasms). Using PRN    ? natalizumab (TYSABRI) 300 MG/15ML injection Inject into the vein every 30 (thirty) days.    ? Polyethyl Glycol-Propyl Glycol 0.4-0.3 % SOLN Apply to eye. Systane dry eye drops prn    ? Probiotic Product (PROBIOTIC PEARLS) CAPS Take 1 capsule by mouth in the morning and at bedtime.    ? White Petrolatum-Mineral Oil (SYSTANE NIGHTTIME) OINT Apply to eye at bedtime.    ? ?No facility-administered medications prior to visit.  ? ? ? ?EXAM: ? ?BP (!) 150/80 (BP Location: Left Arm, Patient Position: Sitting, Cuff Size: Normal)   Pulse 79   Temp 98.5 ?F (36.9 ?C) (Oral)   Ht '5\' 5"'$  (1.651 m)   Wt 156 lb 9.6 oz (71 kg)   SpO2 98%   BMI 26.06 kg/m?  ? ?Body mass index is 26.06 kg/m?. ? ?GENERAL: vitals reviewed and listed above, alert, oriented, appears well hydrated and in no acute distress ?HEENT: atraumatic, conjunctiva  clear, no obvious abnormalities on inspection of external nose and ears  ?NECK: no obvious masses on inspection palpation  ?\ ?  Hard to arise from chair  left knee  mild lateral swelling  no redness  some crepitus  lasteral and pos joint pain   pain on weight bearing .  ?PSYCH: pleasant and cooperative, no obvious depression or anxiety ?Lab Results  ?Component Value Date  ? WBC 9.9 10/22/2021  ? HGB 12.9 10/25/2021  ? HCT 38.0 10/25/2021  ? PLT 248 10/22/2021  ? GLUCOSE 82 10/25/2021  ? CHOL 172 04/24/2021  ? TRIG 112.0 04/24/2021  ? HDL 55.80 04/24/2021  ? LDLDIRECT 110.0 04/23/2015  ? [place] 94 04/24/2021  ? ALT 20 09/24/2021  ? AST 17 09/24/2021  ? NA 140 10/25/2021  ? K 4.2 10/25/2021  ? CL 102 10/25/2021  ? CREATININE 0.60 10/25/2021  ? BUN 13 10/25/2021  ? CO2 29 (A) 09/24/2021  ?  TSH 1.12 04/24/2021  ? HGBA1C 5.2 02/13/2014  ? ?BP Readings from Last  3 Encounters:  ?12/04/21 (!) 150/80  ?11/28/21 (!) 152/88  ?10/25/21 (!) 142/62  ? ? ?ASSESSMENT AND PLAN: ? ?Discussed the following assessment and plan: ? ?Acute pain of left knee - failed prednisone  no injur but sx fairly acute  limiting ambulation a problem with MS - Plan: Ambulatory referral to Orthopedic Surgery ? ?Multiple sclerosis ([place]) - Plan: Ambulatory referral to Orthopedic Surgery ? ?Essential hypertension - Plan: Basic metabolic panel ? ?Medication management - Plan: Basic metabolic panel ? ?-Patient advised to return or notify health care team  if  new concerns arise. ? ?Patient Instructions  ?I think this is  knee related arthritis .  ? Vs other internal knee problem  ?Referral to  orthopedics.      ? In interim   diclofenac  topical  (voltaren gel.)   4 grams r x per day for pain .  ? ? ? ? ? ?[name]. [place] M.D. ?

## 2021-12-04 NOTE — Assessment & Plan Note (Signed)
Add valsartan 80 to amlodipine 2.5(  Had edema with 5 mg)   Lab in 3 weeks   bp readings to send in 3-4 weeks and go from there ?

## 2021-12-04 NOTE — Patient Instructions (Signed)
I think this is  knee related arthritis .  ? Vs other internal knee problem  ?Referral to  orthopedics.      ? In interim   diclofenac  topical  (voltaren gel.)   4 grams r x per day for pain .  ? ? ? ? ? ? ?

## 2021-12-09 DIAGNOSIS — M79672 Pain in left foot: Secondary | ICD-10-CM | POA: Diagnosis not present

## 2021-12-11 ENCOUNTER — Encounter (HOSPITAL_COMMUNITY): Payer: Medicare Other

## 2021-12-11 DIAGNOSIS — M79605 Pain in left leg: Secondary | ICD-10-CM | POA: Diagnosis not present

## 2021-12-11 DIAGNOSIS — M79661 Pain in right lower leg: Secondary | ICD-10-CM | POA: Diagnosis not present

## 2021-12-11 DIAGNOSIS — M79604 Pain in right leg: Secondary | ICD-10-CM | POA: Diagnosis not present

## 2021-12-11 DIAGNOSIS — M25562 Pain in left knee: Secondary | ICD-10-CM | POA: Diagnosis not present

## 2021-12-13 ENCOUNTER — Ambulatory Visit (HOSPITAL_COMMUNITY)
Admission: RE | Admit: 2021-12-13 | Discharge: 2021-12-13 | Disposition: A | Discharge status: Home / Self Care | Payer: Medicare Other | Source: Ambulatory Visit | Attending: Internal Medicine | Admitting: Internal Medicine

## 2021-12-13 ENCOUNTER — Other Ambulatory Visit (HOSPITAL_COMMUNITY): Payer: Self-pay | Admitting: Medical

## 2021-12-13 DIAGNOSIS — M79662 Pain in left lower leg: Secondary | ICD-10-CM | POA: Diagnosis not present

## 2021-12-13 DIAGNOSIS — M7989 Other specified soft tissue disorders: Secondary | ICD-10-CM | POA: Diagnosis not present

## 2021-12-17 DIAGNOSIS — G35 Multiple sclerosis: Secondary | ICD-10-CM | POA: Diagnosis not present

## 2021-12-17 DIAGNOSIS — M79605 Pain in left leg: Secondary | ICD-10-CM | POA: Diagnosis not present

## 2021-12-17 DIAGNOSIS — Z79899 Other long term (current) drug therapy: Secondary | ICD-10-CM | POA: Diagnosis not present

## 2021-12-19 DIAGNOSIS — M25562 Pain in left knee: Secondary | ICD-10-CM | POA: Diagnosis not present

## 2021-12-23 DIAGNOSIS — M79672 Pain in left foot: Secondary | ICD-10-CM | POA: Diagnosis not present

## 2021-12-31 ENCOUNTER — Telehealth: Payer: Self-pay | Admitting: Pharmacist

## 2021-12-31 NOTE — Chronic Care Management (AMB) (Signed)
Chronic Care Management Pharmacy Assistant   Name: [name]  MRN: [id number] DOB: 03-12-1956  Reason for Encounter: Disease State   Conditions to be addressed/monitored: HTN  Recent office visits:  12/13/21 Patient presented to [facility] for LE Venous DVT.  12/04/21 [name], [name], MD - Patient presented for Acute pain of left knee and other concerns. Prescribed Valsartan 80 mg.  Recent consult visits:  12/13/21 [name], PA ([facility]) - Patient presented for pain of left knee joint and pain in Lower Limb. Advised to use OTC Voltaren gel and use knee brace for support.  11/28/21 Rice, [name], MD (Rheumatology) - Patient presented for Positive ANA ( antinuclear antibody) and other concerns. No medication changes.  11/20/21 Karl Luke, MD ( Neuro)  Patient presented for Tysabri infusion Prescribed Prednisone 10 mg.  10/22/21 Karl Luke, MD ( Neuro)  Patient presented for Tysabri infusion. Stopped Meloxicam.  Hospital visits:  Medication Reconciliation was completed by comparing discharge summary, patient's EMR and Pharmacy list, and upon discussion with patient.  Patient presented to [facility] on 10/25/21 due to Cytoscopy with botox injection. Patient was present for 4 hours.  New?Medications Started at [facility] Discharge:?? -started  none  Medication Changes at Hospital Discharge: -Changed  Vitamin B 12  Medications Discontinued at Hospital Discharge: -Stopped none  Medications that remain the same after Hospital Discharge:??  -All other medications will remain the same.    Medications: Outpatient Encounter Medications as of 12/31/2021  Medication Sig Note   amLODipine (NORVASC) 2.5 MG tablet TAKE ONE TABLET BY MOUTH DAILY    antiseptic oral rinse (BIOTENE) LIQD 15 mLs by Mouth Rinse route as needed for dry mouth.    Ascorbic Acid 500 MG CAPS Take 500 mg by mouth daily.    Biotin 10000 MCG TABS Take  by mouth daily at 6 (six) AM. 1000 mg daily    cephALEXin (KEFLEX) 500 MG capsule Take 500 mg by mouth at bedtime.    Cholecalciferol (VITAMIN D-3) 5000 UNITS TABS Take 1 tablet by mouth daily. 2000 units daily    CRANBERRY PO Take 1 capsule by mouth in the morning and at bedtime. 500 mg    Cyanocobalamin (VITAMIN B 12) 500 MCG TABS Take 1,000 mcg by mouth daily.    D-MANNOSE PO Take 1 capsule by mouth in the morning and at bedtime.    DULoxetine (CYMBALTA) 60 MG capsule Take 1 capsule by mouth 2 (two) times daily.    esomeprazole (NEXIUM) 20 MG capsule Take 20 mg by mouth daily. 04/03/2015: She takes otc version   fluticasone (FLONASE) 50 MCG/ACT nasal spray Place 1 spray into both nostrils daily.    gabapentin (NEURONTIN) 300 MG capsule Take 300 mg by mouth at bedtime. Started trial on 10-22-2021 of 100 mg gabapentin in morning    Meth-Hyo-M Bl-Na Phos-Ph Sal (URIBEL) 118 MG CAPS Take 1 capsule by mouth daily as needed (bladder spasms). Using PRN    natalizumab (TYSABRI) 300 MG/15ML injection Inject into the vein every 30 (thirty) days. 02/02/2018: Q 28 days   Polyethyl Glycol-Propyl Glycol 0.4-0.3 % SOLN Apply to eye. Systane dry eye drops prn    Probiotic Product (PROBIOTIC PEARLS) CAPS Take 1 capsule by mouth in the morning and at bedtime.    valsartan (DIOVAN) 80 MG tablet Take 1 tablet (80 mg total) by mouth daily. For high blood pressure    White Petrolatum-Mineral Oil (SYSTANE NIGHTTIME) OINT Apply to eye at  bedtime.    No facility-administered encounter medications on file as of 12/31/2021.   Reviewed chart prior to disease state call. Spoke with patient regarding BP  Recent Office Vitals: BP Readings from Last 3 Encounters:  12/04/21 (!) 150/80  11/28/21 (!) 152/88  10/25/21 (!) 142/62   Pulse Readings from Last 3 Encounters:  12/04/21 79  11/28/21 82  10/25/21 83    Wt Readings from Last 3 Encounters:  12/04/21 156 lb 9.6 oz (71 kg)  11/28/21 156 lb 6.4 oz (70.9 kg)  10/25/21  154 lb 4.8 oz (70 kg)     Kidney Function Lab Results  Component Value Date/Time   CREATININE 0.60 10/25/2021 11:19 AM   CREATININE 0.6 09/24/2021 12:00 AM   CREATININE 0.56 08/12/2021 02:10 PM   GFR 94.52 08/12/2021 02:10 PM   GFRNONAA >60 04/03/2015 10:35 AM   GFRAA >60 04/03/2015 10:35 AM       Latest Ref Rng & Units 10/25/2021   11:19 AM 09/24/2021   12:00 AM 08/12/2021    2:10 PM  BMP  Glucose 70 - 99 mg/dL 82    83    BUN 8 - 23 mg/dL '13   8      12    '$ Creatinine 0.44 - 1.00 mg/dL 0.60   0.6      0.56    Sodium 135 - 145 mmol/L 140   139      140    Potassium 3.5 - 5.1 mmol/L 4.2   3.6      3.8    Chloride 98 - 111 mmol/L 102   102      102    CO2 13 - 22  29      32    Calcium 8.7 - 10.7  9.1      9.7       This result is from an external source.    Current antihypertensive regimen:  Amlodipine 2.5 mg 1 tablet daily - Appropriate, Query effective, Safe, Accessible  Unable to reach for assessment      Care Gaps: BP- 150/80 ( 12/04/21) AWV- 1/23 CCM- 8/23  Star Rating Drugs: None  Ned Clines [place] Clinical Pharmacist Assistant [phone]

## 2022-01-01 DIAGNOSIS — M25562 Pain in left knee: Secondary | ICD-10-CM | POA: Diagnosis not present

## 2022-01-06 DIAGNOSIS — M79672 Pain in left foot: Secondary | ICD-10-CM | POA: Diagnosis not present

## 2022-01-07 DIAGNOSIS — R339 Retention of urine, unspecified: Secondary | ICD-10-CM | POA: Diagnosis not present

## 2022-01-08 DIAGNOSIS — M25562 Pain in left knee: Secondary | ICD-10-CM | POA: Diagnosis not present

## 2022-01-09 ENCOUNTER — Other Ambulatory Visit: Payer: Self-pay

## 2022-01-09 DIAGNOSIS — Z79899 Other long term (current) drug therapy: Secondary | ICD-10-CM

## 2022-01-09 DIAGNOSIS — N398 Other specified disorders of urinary system: Secondary | ICD-10-CM

## 2022-01-09 DIAGNOSIS — G35 Multiple sclerosis: Secondary | ICD-10-CM

## 2022-01-09 NOTE — Addendum Note (Signed)
Addended by: Geradine Girt D on: 01/09/2022 09:12 AM   Modules accepted: Orders

## 2022-01-14 DIAGNOSIS — G35 Multiple sclerosis: Secondary | ICD-10-CM | POA: Diagnosis not present

## 2022-01-14 DIAGNOSIS — Z79899 Other long term (current) drug therapy: Secondary | ICD-10-CM | POA: Diagnosis not present

## 2022-01-20 DIAGNOSIS — M79672 Pain in left foot: Secondary | ICD-10-CM | POA: Diagnosis not present

## 2022-02-03 DIAGNOSIS — L601 Onycholysis: Secondary | ICD-10-CM | POA: Diagnosis not present

## 2022-02-03 DIAGNOSIS — Z85828 Personal history of other malignant neoplasm of skin: Secondary | ICD-10-CM | POA: Diagnosis not present

## 2022-02-10 DIAGNOSIS — M79672 Pain in left foot: Secondary | ICD-10-CM | POA: Diagnosis not present

## 2022-02-14 DIAGNOSIS — R339 Retention of urine, unspecified: Secondary | ICD-10-CM | POA: Diagnosis not present

## 2022-02-17 DIAGNOSIS — M79672 Pain in left foot: Secondary | ICD-10-CM | POA: Diagnosis not present

## 2022-02-18 DIAGNOSIS — Z79899 Other long term (current) drug therapy: Secondary | ICD-10-CM | POA: Diagnosis not present

## 2022-02-18 DIAGNOSIS — M546 Pain in thoracic spine: Secondary | ICD-10-CM | POA: Diagnosis not present

## 2022-02-18 DIAGNOSIS — G35 Multiple sclerosis: Secondary | ICD-10-CM | POA: Diagnosis not present

## 2022-03-10 DIAGNOSIS — M79672 Pain in left foot: Secondary | ICD-10-CM | POA: Diagnosis not present

## 2022-03-12 DIAGNOSIS — R339 Retention of urine, unspecified: Secondary | ICD-10-CM | POA: Diagnosis not present

## 2022-03-17 DIAGNOSIS — N3020 Other chronic cystitis without hematuria: Secondary | ICD-10-CM | POA: Diagnosis not present

## 2022-03-18 ENCOUNTER — Other Ambulatory Visit: Payer: Self-pay | Admitting: Urology

## 2022-03-18 DIAGNOSIS — M25562 Pain in left knee: Secondary | ICD-10-CM | POA: Diagnosis not present

## 2022-03-25 ENCOUNTER — Telehealth: Payer: Self-pay | Admitting: Pharmacist

## 2022-03-25 NOTE — Progress Notes (Deleted)
Chronic Care Management Pharmacy Note  03/25/2022 Name:  [name] MRN:  [id number] DOB:  [birth year]  Summary: BP has been variable per office readings  Recommendations/Changes made from today's visit: -Recommended supplementation with 600 mg of calcium citrate daily based on calcium intake -Recommended for patient to purchase an arm cuff to replace the wrist cuff and begin checking weekly at home -Recommended trial of saline nasal spray and/or Nasacort to replace Flonase   Plan: BP assessment in 3 months Follow up in 6 months  Subjective: [name] is an 66 y.o. year old female who is a primary patient of [name], [name], MD.  The CCM team was consulted for assistance with disease management and care coordination needs.    Engaged with patient by telephone for follow up visit in response to provider referral for pharmacy case management and/or care coordination services.   Consent to Services:  The patient was given information about Chronic Care Management services, agreed to services, and gave verbal consent prior to initiation of services.  Please see initial visit note for detailed documentation.   Patient Care Team: Burnis Medin, MD as PCP - Silvestre Moment, MD as Referring Physician (Obstetrics and Gynecology) Ceasar Mons, MD as Consulting Physician (Urology) Sydnee Levans, MD as Consulting Physician (Dermatology) Karl Luke, MD as Referring Physician (Neurology) Viona Gilmore, [facility] as Pharmacist (Pharmacist)  Recent office visits:  12/04/21 [name], [name], MD - Patient presented for Acute pain of left knee and other concerns. Prescribed Valsartan 80 mg.  Recent consult visits: 03/18/22 [name], MD ([facility]): Patient presented for left knee pain. Unable to access notes.  02/18/22 Karl Luke, MD (neurology): Patient presented for MS follow up and Tysabri infusion. Follow up in 5 weeks.  01/14/22 Karl Luke, MD (neurology): Patient presented for MS follow up and Tysabri infusion. Follow up in 5 weeks.  01/08/22 [name], MD ([facility]): Patient presented for left knee pain. Unable to access notes.  12/13/21 Patient presented to [name] for LE Venous DVT.  12/13/21 [name], PA ([facility]) - Patient presented for pain of left knee joint and pain in Lower Limb. Advised to use OTC Voltaren gel and use knee brace for support.   11/28/21 Rice, [name], MD (Rheumatology) - Patient presented for Positive ANA ( antinuclear antibody) and other concerns. No medication changes.   11/20/21 Karl Luke, MD ( Neuro)  Patient presented for Tysabri infusion Prescribed Prednisone 10 mg.   10/22/21 Karl Luke, MD ( Neuro)  Patient presented for Tysabri infusion. Stopped Meloxicam.   Hospital visits: Medication Reconciliation was completed by comparing discharge summary, patient's EMR and Pharmacy list, and upon discussion with patient.   Patient presented to [facility] on 10/25/21 due to Cytoscopy with botox injection. Patient was present for 4 hours.   New?Medications Started at [facility] Discharge:?? -started  none   Medication Changes at Hospital Discharge: -Changed  Vitamin B 12   Medications Discontinued at Hospital Discharge: -Stopped none   Medications that remain the same after Hospital Discharge:??  -All other medications will remain the same.     Objective:  Lab Results  Component Value Date   CREATININE 0.60 10/25/2021   BUN 13 10/25/2021   GFR 94.52 08/12/2021   GFRNONAA >60 04/03/2015   GFRAA >60 04/03/2015   NA 140 10/25/2021   K 4.2 10/25/2021   CALCIUM 9.1 09/24/2021   CO2 29 (A) 09/24/2021   GLUCOSE  82 10/25/2021    Lab Results  Component Value Date/Time   HGBA1C 5.2 02/13/2014 10:41 AM   GFR 94.52 08/12/2021 02:10 PM   GFR 83.95 06/01/2018 02:25 PM    Last diabetic Eye exam: No results found for:  "HMDIABEYEEXA"  Last diabetic Foot exam: No results found for: "HMDIABFOOTEX"   Lab Results  Component Value Date   CHOL 172 04/24/2021   HDL 55.80 04/24/2021   LDLCALC 94 04/24/2021   LDLDIRECT 110.0 04/23/2015   TRIG 112.0 04/24/2021   CHOLHDL 3 04/24/2021       Latest Ref Rng & Units 09/24/2021   12:00 AM 08/12/2021    2:18 PM 08/12/2021    2:10 PM  Hepatic Function  Total Protein 6.0 - 8.5 g/dL  6.4  6.9   Albumin 3.5 - 5.0 4.4      4.4   AST 13 - 35 17      18   ALT 7 - 35 20      20   Alk Phosphatase 25 - 125 87      84   Total Bilirubin 0.2 - 1.2 mg/dL   0.6      This result is from an external source.    Lab Results  Component Value Date/Time   TSH 1.12 04/24/2021 11:51 AM   TSH 1.01 04/27/2017 01:57 PM   FREET4 0.76 04/23/2015 03:10 PM   FREET4 0.74 02/13/2014 10:41 AM       Latest Ref Rng & Units 10/25/2021   11:19 AM 10/22/2021   12:00 AM 09/24/2021   12:00 AM  CBC  WBC   9.9     12.4      Hemoglobin 12.0 - 15.0 g/dL 12.9  12.8     12.9      Hematocrit 36.0 - 46.0 % 38.0  37     38      Platelets 150 - 400 K/uL  248     249         This result is from an external source.    Lab Results  Component Value Date/Time   VD25OH 82.38 04/24/2021 11:51 AM   VD25OH 69.39 06/01/2018 02:25 PM    Clinical ASCVD: No  The 10-year ASCVD risk score (Arnett DK, et al., 2019) is: 10.2%   Values used to calculate the score:     Age: 66 years     Sex: Female     Is Non-Hispanic African American: No     Diabetic: No     Tobacco smoker: No     Systolic Blood Pressure: 536 mmHg     Is BP treated: Yes     HDL Cholesterol: 55.8 mg/dL     Total Cholesterol: 172 mg/dL       12/04/2021    3:04 PM 08/12/2021    4:04 PM 08/20/2020    2:56 PM  Depression screen PHQ 2/9  Decreased Interest 0 0 0  Down, Depressed, Hopeless 0 0 0  PHQ - 2 Score 0 0 0  Altered sleeping 0    Tired, decreased energy 0    Change in appetite 0    Feeling bad or failure about yourself  0     Trouble concentrating 0    Moving slowly or fidgety/restless 0    Suicidal thoughts 0    PHQ-9 Score 0    Difficult doing work/chores Not difficult at all        Social History   Tobacco  Use  Smoking Status Never   Passive exposure: Never  Smokeless Tobacco Never   BP Readings from Last 3 Encounters:  12/04/21 (!) 150/80  11/28/21 (!) 152/88  10/25/21 (!) 142/62   Pulse Readings from Last 3 Encounters:  12/04/21 79  11/28/21 82  10/25/21 83   Wt Readings from Last 3 Encounters:  12/04/21 156 lb 9.6 oz (71 kg)  11/28/21 156 lb 6.4 oz (70.9 kg)  10/25/21 154 lb 4.8 oz (70 kg)   BMI Readings from Last 3 Encounters:  12/04/21 26.06 kg/m  11/28/21 26.03 kg/m  10/25/21 24.90 kg/m    Assessment/Interventions: Review of patient past medical history, allergies, medications, health status, including review of consultants reports, laboratory and other test data, was performed as part of comprehensive evaluation and provision of chronic care management services.   SDOH:  (Social Determinants of Health) assessments and interventions performed: Yes   SDOH Screenings   Alcohol Screen: Low Risk  (08/20/2020)   Alcohol Screen    Last Alcohol Screening Score (AUDIT): 4  Depression (PHQ2-9): Low Risk  (12/04/2021)   Depression (PHQ2-9)    PHQ-2 Score: 0  Financial Resource Strain: Low Risk  (08/12/2021)   Overall Financial Resource Strain (CARDIA)    Difficulty of Paying Living Expenses: Not hard at all  Food Insecurity: No Food Insecurity (08/12/2021)   Hunger Vital Sign    Worried About Running Out of Food in the Last Year: Never true    [place] in the Last Year: Never true  Housing: Low Risk  (08/20/2020)   Housing    Last Housing Risk Score: 0  Physical Activity: Insufficiently Active (08/12/2021)   Exercise Vital Sign    Days of Exercise per Week: 3 days    Minutes of Exercise per Session: 20 min  Social Connections: Socially Integrated (08/20/2020)   Social  Connection and Isolation Panel [NHANES]    Frequency of Communication with Friends and Family: More than three times a week    Frequency of Social Gatherings with Friends and Family: More than three times a week    Attends Religious Services: More than 4 times per year    Active Member of Clubs or Organizations: Yes    Attends Archivist Meetings: More than 4 times per year    Marital Status: Married  Stress: No Stress Concern Present (08/12/2021)   [employer] [place]    Feeling of Stress : Not at all  Tobacco Use: Low Risk  (12/04/2021)   Patient History    Smoking Tobacco Use: Never    Smokeless Tobacco Use: Never    Passive Exposure: Never  Transportation Needs: No Transportation Needs (08/12/2021)   PRAPARE - Transportation    Lack of Transportation (Medical): No    Lack of Transportation (Non-Medical): No    CCM Care Plan  Allergies  Allergen Reactions   Meclizine Hcl     REACTION: Psychotic reaction   Tramadol Itching   Omeprazole Magnesium Hives   Prilosec [Omeprazole Magnesium] Hives   Tramadol Hcl Itching    Medications Reviewed Today     Reviewed by Burnis Medin, MD (Physician) on 12/04/21 at [facility]  Med List Status: <None>   Medication Order Taking? Sig Documenting Provider Last Dose Status Informant  amLODipine (NORVASC) 2.5 MG tablet [id number] Yes TAKE ONE TABLET BY MOUTH DAILY [name], [name], MD Taking Active Self  antiseptic oral rinse Charlene Brooke) LIQD [id number] Yes 15 mLs by  Mouth Rinse route as needed for dry mouth. [provider] Taking Active   Ascorbic Acid 500 MG CAPS [id number] Yes Take 500 mg by mouth daily. [provider] Taking Active Self  Biotin 10000 MCG TABS [id number] Yes Take by mouth daily at 6 (six) AM. 1000 mg daily [provider] Taking Active Self  cephALEXin (KEFLEX) 500 MG capsule [id number] Yes Take 500 mg by mouth at bedtime. [provider] Taking Active Self  Cholecalciferol (VITAMIN D-3) 5000 UNITS TABS [id number] Yes Take 1 tablet by mouth daily. 2000 units daily [provider] Taking Active Self  CRANBERRY PO [id number] Yes Take 1 capsule by mouth in the morning and at bedtime. 500 mg [provider] Taking Active Self  Cyanocobalamin (VITAMIN B 12) 500 MCG TABS [id number] Yes Take 1,000 mcg by mouth daily. Ceasar Mons, MD Taking Active   D-MANNOSE PO [id number] Yes Take 1 capsule by mouth in the morning and at bedtime. [provider] Taking Active Self  DULoxetine (CYMBALTA) 60 MG capsule [id number] Yes Take 1 capsule by mouth 2 (two) times daily. [provider] Taking Active Self  esomeprazole (NEXIUM) 20 MG capsule [id number] Yes Take 20 mg by mouth daily. [provider] Taking Active Self           Med Note Caryn Section, Marylyn Ishihara A   Tue Apr 03, 2015 11:25 AM) She takes otc version  fluticasone (FLONASE) 50 MCG/ACT nasal spray [id number] Yes Place 1 spray into both nostrils daily. [provider] Taking Active Self  gabapentin (NEURONTIN) 300 MG capsule [id number] Yes Take 300 mg by mouth at bedtime. Started trial on 10-22-2021 of 100 mg gabapentin in morning [provider] Taking Active Self  Meth-Hyo-M Bl-Na Phos-Ph Sal (URIBEL) 118 MG CAPS [id number] Yes Take 1 capsule by mouth daily as needed (bladder spasms). Using PRN [provider] Taking Active Self  natalizumab (TYSABRI) 300 MG/15ML injection [id number] Yes Inject into the vein every 30 (thirty) days. [provider] Taking Active Self           Med Note Luther Hearing, SUSAN   Tue Feb 02, 2018  8:12 AM) Q 28 days  Polyethyl Glycol-Propyl Glycol 0.4-0.3 % SOLN [id number] Yes Apply to eye. Systane dry eye drops prn [provider] Taking Active Self  Probiotic Product (PROBIOTIC PEARLS) CAPS [id number] Yes Take 1 capsule by mouth in the morning and at bedtime. [provider] Taking Active Self  valsartan (DIOVAN) 80 MG tablet [id number] Yes Take 1 tablet (80 mg total) by mouth daily. For high blood pressure [name], [name], MD  Active   White Petrolatum-Mineral Oil (SYSTANE NIGHTTIME) OINT [id number] Yes Apply to eye at bedtime. [provider] Taking Active             Patient Active Problem List   Diagnosis Date Noted   Positive ANA (antinuclear antibody) 11/28/2021   Discoloration of skin of toe 11/28/2021   Rectal bleeding 12/05/2016   Internal hemorrhoids 12/05/2016   Hx of falling 03/17/2016   Nausea alone 02/13/2014   Other malaise and fatigue 02/13/2014   Postmenopausal estrogen deficiency 02/24/2013   Visit for preventive health examination 02/24/2013   Family hx osteoporosis 02/21/2013   Recurrent UTI 01/09/2012   Suprapubic tenderness 12/01/2011   GERD (gastroesophageal reflux disease) 10/15/2011   Dysphagia, pharyngoesophageal 10/15/2011   Voiding dysfunction 09/24/2011   Hypertriglyceridemia 02/09/2011   VITAMIN D DEFICIENCY 12/13/2008   Constipation 12/13/2008   MUSCLE SPASM 12/13/2008  DIZZINESS 12/13/2008   HYPOKALEMIA, HX OF 12/13/2008   DISORDER, ADJUSTMENT W/DEPRESSED MOOD 04/30/2007   SCLEROSIS, MULTIPLE 04/30/2007   Essential hypertension 04/30/2007   VARICOSE VEIN, LWR EXTREMITIES W/INFLAMMATION 04/30/2007   VASOMOTOR RHINITIS 04/30/2007    Immunization History  Administered Date(s) Administered   Influenza Whole 05/07/2010, 06/06/2011   Influenza, Quadrivalent, Recombinant, Inj, Pf 04/21/2018, 05/11/2019, 05/11/2019   Influenza,inj,Quad PF,6+ Mos 05/10/2014, 04/23/2015, 04/23/2017   Influenza-Unspecified 04/24/2017, 05/21/2020, 06/26/2021, 07/09/2021   PFIZER(Purple Top)SARS-COV-2 Vaccination 08/17/2019, 09/07/2019, 10/21/2019, 05/21/2020   Pneumococcal Conjugate (Pcv15) 07/09/2021   Tdap 02/05/2011, 07/12/2015   Zoster Recombinat (Shingrix) 05/13/2019   Zoster, Live 05/10/2014   BP Readings from Last  3 Encounters:  12/04/21 (!) 150/80  11/28/21 (!) 152/88  10/25/21 (!) 142/62   Patient reports she has been seeing a physiotherapist who is similar to PT but she also does massage and acupuncture and reports this has been really helpful.  Patient inquired about continuing with Flonase therapy as she has been on continuously for years without breaks and doesn't feel like it's working anymore.  -bp? Needs follow up with PCP?  -Recommended supplementation with 600 mg of calcium citrate daily based on calcium intake -Recommended for patient to purchase an arm cuff to replace the wrist cuff and begin checking weekly at home -Recommended trial of saline nasal spray and/or Nasacort to replace Flonase   -cpe? Lst 04/24/21   Conditions to be addressed/monitored:  Hypertension, GERD, Osteopenia, Allergic Rhinitis, and MS, Muscle spasms, and UTI prevention  Conditions addressed this visit: Hypertension, Osteopenia  There are no care plans that you recently modified to display for this patient.     Medication Assistance: None required.  Patient affirms current coverage meets needs.  Compliance/Adherence/Medication fill history: Care Gaps: Prevnar 20, second dose of shingrix, mammogram, COVID booster, influenza BP- 150/80 12/04/21  Star-Rating Drugs: Valsartan 80 mg - Last filled 01/05/22 90 DS at [name] Verified  Patient's preferred pharmacy is:  [facility] [id number], [name] [address] Phone: [phone] Fax: [phone]   Uses pill box? Yes - weekly (AM/PM) Pt endorses 95% compliance - 2-3 times a month (out of routine)  We discussed: Current pharmacy is preferred with insurance plan and patient is satisfied with pharmacy services Patient decided to: Continue current medication management strategy  Care Plan and Follow Up Patient Decision:  Patient agrees to Care Plan and Follow-up.  Plan:  Telephone follow up appointment with care management team member scheduled for:  6 months  [name], PharmD, [place] Pharmacist [place] at [place] [phone]

## 2022-03-25 NOTE — Chronic Care Management (AMB) (Signed)
    Chronic Care Management Pharmacy Assistant   Name: [name]  MRN: [id number] DOB: [birth year]   03/25/22 APPOINTMENT REMINDER   Called Patient No answer, left message of appointment on 03/26/22 at 2 via telephone visit with [name], Pharm D.   Notified to have all medications, supplements, blood pressure and/or blood sugar logs available during appointment and to return call if need to reschedule.   Care Gaps: Zoster Vaccine - Overdue Mammogram - Overdue COVID Booster - Overdue Flu Vaccine - Overdue BP- 150/80 12/04/21 AWV- 1/23   Star Rating Drug: Valsartan 80 mg - Last filled 01/05/22 [facility] DS at [name] Verified    Medications: Outpatient Encounter Medications as of 03/25/2022  Medication Sig Note   amLODipine (NORVASC) 2.5 MG tablet TAKE ONE TABLET BY MOUTH DAILY    antiseptic oral rinse (BIOTENE) LIQD 15 mLs by Mouth Rinse route as needed for dry mouth.    Ascorbic Acid 500 MG CAPS Take 500 mg by mouth daily.    Biotin 10000 MCG TABS Take by mouth daily at 6 (six) AM. 1000 mg daily    cephALEXin (KEFLEX) 500 MG capsule Take 500 mg by mouth at bedtime.    Cholecalciferol (VITAMIN D-3) 5000 UNITS TABS Take 1 tablet by mouth daily. 2000 units daily    CRANBERRY PO Take 1 capsule by mouth in the morning and at bedtime. 500 mg    Cyanocobalamin (VITAMIN B 12) 500 MCG TABS Take 1,000 mcg by mouth daily.    D-MANNOSE PO Take 1 capsule by mouth in the morning and at bedtime.    DULoxetine (CYMBALTA) 60 MG capsule Take 1 capsule by mouth 2 (two) times daily.    esomeprazole (NEXIUM) 20 MG capsule Take 20 mg by mouth daily. 04/03/2015: She takes otc version   fluticasone (FLONASE) 50 MCG/ACT nasal spray Place 1 spray into both nostrils daily.    gabapentin (NEURONTIN) 300 MG capsule Take 300 mg by mouth at bedtime. Started trial on 10-22-2021 of 100 mg gabapentin in morning    Meth-Hyo-M Bl-Na Phos-Ph Sal (URIBEL) 118 MG CAPS Take 1 capsule by mouth daily as  needed (bladder spasms). Using PRN    natalizumab (TYSABRI) 300 MG/15ML injection Inject into the vein every 30 (thirty) days. 02/02/2018: Q 28 days   Polyethyl Glycol-Propyl Glycol 0.4-0.3 % SOLN Apply to eye. Systane dry eye drops prn    Probiotic Product (PROBIOTIC PEARLS) CAPS Take 1 capsule by mouth in the morning and at bedtime.    valsartan (DIOVAN) 80 MG tablet Take 1 tablet (80 mg total) by mouth daily. For high blood pressure    White Petrolatum-Mineral Oil (SYSTANE NIGHTTIME) OINT Apply to eye at bedtime.    No facility-administered encounter medications on file as of 03/25/2022.      [place] Clinical Pharmacist Assistant [phone]

## 2022-03-26 ENCOUNTER — Telehealth: Payer: Medicare Other | Admitting: Pharmacist

## 2022-03-31 DIAGNOSIS — M79672 Pain in left foot: Secondary | ICD-10-CM | POA: Diagnosis not present

## 2022-03-31 NOTE — Progress Notes (Signed)
Chronic Care Management Pharmacy Note  04/10/2022 Name:  [name] MRN:  [id number] DOB:  [birth year]  Summary: BP has been variable and pt is not checking consistently at home Pt stopped taking valsartan on her own and could not recall the reason  Recommendations/Changes made from today's visit: -Recommended switching valsartan to telmisartan -Recommended for patient to purchase an arm cuff to replace the wrist cuff and begin checking weekly at home -Recommended bringing BP cuff to office visit with PCP  Plan: BP assessment in 3 months Follow up in 6 months  Subjective: [name] is an 66 y.o. year old female who is a primary patient of [name], [name], MD.  The CCM team was consulted for assistance with disease management and care coordination needs.    Engaged with patient by telephone for follow up visit in response to provider referral for pharmacy case management and/or care coordination services.   Consent to Services:  The patient was given information about Chronic Care Management services, agreed to services, and gave verbal consent prior to initiation of services.  Please see initial visit note for detailed documentation.   Patient Care Team: Burnis Medin, MD as PCP - Silvestre Moment, MD as Referring Physician (Obstetrics and Gynecology) Ceasar Mons, MD as Consulting Physician (Urology) Sydnee Levans, MD as Consulting Physician (Dermatology) Karl Luke, MD as Referring Physician (Neurology) Viona Gilmore, [facility] as Pharmacist (Pharmacist)  Recent office visits:  12/04/21 [name], [name], MD - Patient presented for Acute pain of left knee and other concerns. Prescribed Valsartan 80 mg.  Recent consult visits: 03/18/22 [name], MD ([facility]): Patient presented for left knee pain. Unable to access notes.  02/18/22 Karl Luke, MD (neurology): Patient presented for MS follow up and Tysabri infusion. Follow  up in 5 weeks.  01/14/22 Karl Luke, MD (neurology): Patient presented for MS follow up and Tysabri infusion. Follow up in 5 weeks.  01/08/22 [name], MD ([facility]): Patient presented for left knee pain. Unable to access notes.  12/13/21 Patient presented to [facility] for LE Venous DVT.  12/13/21 [name], PA ([facility]) - Patient presented for pain of left knee joint and pain in Lower Limb. Advised to use OTC Voltaren gel and use knee brace for support.   11/28/21 Rice, [name], MD (Rheumatology) - Patient presented for Positive ANA ( antinuclear antibody) and other concerns. No medication changes.   11/20/21 Karl Luke, MD ( Neuro)  Patient presented for Tysabri infusion Prescribed Prednisone 10 mg.   10/22/21 Karl Luke, MD ( Neuro)  Patient presented for Tysabri infusion. Stopped Meloxicam.   Hospital visits: Medication Reconciliation was completed by comparing discharge summary, patient's EMR and Pharmacy list, and upon discussion with patient.   Patient presented to [facility] on 10/25/21 due to Cytoscopy with botox injection. Patient was present for 4 hours.   New?Medications Started at [facility] Discharge:?? -started  none   Medication Changes at Hospital Discharge: -Changed  Vitamin B 12   Medications Discontinued at Hospital Discharge: -Stopped none   Medications that remain the same after Hospital Discharge:??  -All other medications will remain the same.     Objective:  Lab Results  Component Value Date   CREATININE 0.60 04/09/2022   BUN 13 04/09/2022   GFR 94.52 08/12/2021   GFRNONAA >60 04/03/2015   GFRAA >60 04/03/2015   NA 143 04/09/2022   K 4.3 04/09/2022   CALCIUM 9.1 09/24/2021  CO2 29 (A) 09/24/2021   GLUCOSE 83 04/09/2022    Lab Results  Component Value Date/Time   HGBA1C 5.2 02/13/2014 10:41 AM   GFR 94.52 08/12/2021 02:10 PM   GFR 83.95 06/01/2018 02:25 PM    Last diabetic Eye  exam: No results found for: "HMDIABEYEEXA"  Last diabetic Foot exam: No results found for: "HMDIABFOOTEX"   Lab Results  Component Value Date   CHOL 172 04/24/2021   HDL 55.80 04/24/2021   LDLCALC 94 04/24/2021   LDLDIRECT 110.0 04/23/2015   TRIG 112.0 04/24/2021   CHOLHDL 3 04/24/2021       Latest Ref Rng & Units 09/24/2021   12:00 AM 08/12/2021    2:18 PM 08/12/2021    2:10 PM  Hepatic Function  Total Protein 6.0 - 8.5 g/dL  6.4  6.9   Albumin 3.5 - 5.0 4.4      4.4   AST 13 - 35 17      18   ALT 7 - 35 20      20   Alk Phosphatase 25 - 125 87      84   Total Bilirubin 0.2 - 1.2 mg/dL   0.6      This result is from an external source.    Lab Results  Component Value Date/Time   TSH 1.12 04/24/2021 11:51 AM   TSH 1.01 04/27/2017 01:57 PM   FREET4 0.76 04/23/2015 03:10 PM   FREET4 0.74 02/13/2014 10:41 AM       Latest Ref Rng & Units 04/09/2022    8:10 AM 10/25/2021   11:19 AM 10/22/2021   12:00 AM  CBC  WBC    9.9      Hemoglobin 12.0 - 15.0 g/dL 11.6  12.9  12.8      Hematocrit 36.0 - 46.0 % 34.0  38.0  37      Platelets 150 - 400 K/uL   248         This result is from an external source.    Lab Results  Component Value Date/Time   VD25OH 82.38 04/24/2021 11:51 AM   VD25OH 69.39 06/01/2018 02:25 PM    Clinical ASCVD: No  The 10-year ASCVD risk score (Arnett DK, et al., 2019) is: 10.9%   Values used to calculate the score:     Age: 53 years     Sex: Female     Is Non-Hispanic African American: No     Diabetic: No     Tobacco smoker: No     Systolic Blood Pressure: 830 mmHg     Is BP treated: Yes     HDL Cholesterol: 55.8 mg/dL     Total Cholesterol: 172 mg/dL       12/04/2021    3:04 PM 08/12/2021    4:04 PM 08/20/2020    2:56 PM  Depression screen PHQ 2/9  Decreased Interest 0 0 0  Down, Depressed, Hopeless 0 0 0  PHQ - 2 Score 0 0 0  Altered sleeping 0    Tired, decreased energy 0    Change in appetite 0    Feeling bad or failure about  yourself  0    Trouble concentrating 0    Moving slowly or fidgety/restless 0    Suicidal thoughts 0    PHQ-9 Score 0    Difficult doing work/chores Not difficult at all        Social History   Tobacco Use  Smoking Status Never  Passive exposure: Never  Smokeless Tobacco Never   BP Readings from Last 3 Encounters:  04/09/22 (!) 145/65  12/04/21 (!) 150/80  11/28/21 (!) 152/88   Pulse Readings from Last 3 Encounters:  04/09/22 74  12/04/21 79  11/28/21 82   Wt Readings from Last 3 Encounters:  04/09/22 158 lb 1.6 oz (71.7 kg)  12/04/21 156 lb 9.6 oz (71 kg)  11/28/21 156 lb 6.4 oz (70.9 kg)   BMI Readings from Last 3 Encounters:  04/09/22 26.31 kg/m  12/04/21 26.06 kg/m  11/28/21 26.03 kg/m    Assessment/Interventions: Review of patient past medical history, allergies, medications, health status, including review of consultants reports, laboratory and other test data, was performed as part of comprehensive evaluation and provision of chronic care management services.   SDOH:  (Social Determinants of Health) assessments and interventions performed: Yes SDOH Interventions    Flowsheet Row Chronic Care Management from 01/30/2021 in [place] [facility] [place] from 08/20/2020 in [place] at [place] Interventions -- Intervention Not Indicated  Housing Interventions -- Intervention Not Indicated  Transportation Interventions Intervention Not Indicated Intervention Not Indicated  Financial Strain Interventions Intervention Not Indicated Intervention Not Indicated  Physical Activity Interventions -- Intervention Not Indicated  Stress Interventions -- Intervention Not Indicated  Social Connections Interventions -- Intervention Not Indicated       SDOH Screenings   Food Insecurity: No Food Insecurity (08/12/2021)  Housing: Low Risk  (08/20/2020)  Transportation Needs: No Transportation Needs  (08/12/2021)  Alcohol Screen: Low Risk  (08/20/2020)  Depression (PHQ2-9): Low Risk  (12/04/2021)  Financial Resource Strain: Low Risk  (08/12/2021)  Physical Activity: Insufficiently Active (08/12/2021)  Social Connections: Socially Integrated (08/20/2020)  Stress: No Stress Concern Present (08/12/2021)  Tobacco Use: Low Risk  (04/10/2022)    CCM Care Plan  Allergies  Allergen Reactions   Meclizine Hcl     REACTION: Psychotic reaction   Tramadol Itching   Omeprazole Magnesium Hives   Prilosec [Omeprazole Magnesium] Hives   Tramadol Hcl Itching    Medications Reviewed Today     Reviewed by Shawna Orleans, RN (Registered Nurse) on 04/09/22 at 0751  Med List Status: <None>   Medication Order Taking? Sig Documenting Provider Last Dose Status Informant  amLODipine (NORVASC) 2.5 MG tablet [id number] Yes TAKE ONE TABLET BY MOUTH DAILY [name], [name], MD 04/08/2022 Active Self  antiseptic oral rinse Charlene Brooke) LIQD [id number] Yes 15 mLs by Mouth Rinse route as needed for dry mouth. [provider] 04/08/2022 Active Self  Ascorbic Acid 500 MG CAPS [id number] Yes Take 500 mg by mouth daily. [provider] 04/08/2022 Active Self  Biotin 10000 MCG TABS [id number] Yes Take by mouth daily at 6 (six) AM. 1000 mg daily [provider] 04/08/2022 Active Self  cephALEXin (KEFLEX) 500 MG capsule [id number] Yes Take 500 mg by mouth at bedtime. [provider] 04/08/2022 Active Self  Cholecalciferol (VITAMIN D-3) 5000 UNITS TABS [id number] Yes Take 1 tablet by mouth daily. 2000 units daily [provider] 04/08/2022 Active Self  CRANBERRY PO [id number] Yes Take 1 capsule by mouth in the morning and at bedtime. 500 mg [provider] 04/08/2022 Active Self  Cyanocobalamin (VITAMIN B 12) 500 MCG TABS [id number] Yes Take 1,000 mcg by mouth daily. Ceasar Mons, MD 04/08/2022 Active Self  D-MANNOSE PO [id number] Yes Take 1 capsule by mouth in the morning and at  bedtime. [provider] 04/08/2022 Active Self  DULoxetine (CYMBALTA) 60 MG capsule [id number] Yes Take 1 capsule by mouth 2 (two) times daily. [provider] 04/08/2022 Active Self  esomeprazole (NEXIUM) 20 MG capsule [id number] Yes Take 20 mg by mouth daily. [provider] 04/08/2022 Active Self           Med Note Caryn Section, KYLE A   Tue Apr 03, 2015 11:25 AM) She takes otc version  fluticasone (FLONASE) 50 MCG/ACT nasal spray [id number] Yes Place 1 spray into both nostrils daily. [provider] 04/08/2022 Active Self  gabapentin (NEURONTIN) 300 MG capsule [id number] Yes Take 300 mg by mouth at bedtime. [provider] 04/08/2022 Active Self  ibuprofen (ADVIL) 200 MG tablet [id number] Yes Take 600 mg by mouth every 6 (six) hours as needed. 3 x weekly takes bid [provider] Past Week Active Self  Meth-Hyo-M Bl-Na Phos-Ph Sal (URIBEL) 118 MG CAPS [id number] Yes Take 1 capsule by mouth daily as needed (bladder spasms). Using PRN [provider] Past Month Active Self  natalizumab (TYSABRI) 300 MG/15ML injection [id number] No Inject into the vein. Every  6 weeks [provider] 04/02/2022 Active Self           Med Note [facility], SHARON P   Tue Apr 01, 2022 11:15 AM)    Polyethyl Glycol-Propyl Glycol 0.4-0.3 % SOLN [id number] Yes Apply to eye. Systane dry eye drops prn [provider] 04/09/2022 0700 Active Self  Probiotic Product (PROBIOTIC PEARLS) CAPS [id number] Yes Take 1 capsule by mouth in the morning and at bedtime. [provider] 04/08/2022 Active Self  White Petrolatum-Mineral Oil (SYSTANE NIGHTTIME) OINT [id number] Yes Apply to eye at bedtime. [provider] 04/08/2022 Active             Patient Active Problem List   Diagnosis Date Noted   Positive ANA (antinuclear antibody) 11/28/2021   Discoloration of skin of toe 11/28/2021   Rectal bleeding 12/05/2016   Internal hemorrhoids 12/05/2016   Hx of  falling 03/17/2016   Nausea alone 02/13/2014   Other malaise and fatigue 02/13/2014   Postmenopausal estrogen deficiency 02/24/2013   Visit for preventive health examination 02/24/2013   Family hx osteoporosis 02/21/2013   Recurrent UTI 01/09/2012   Suprapubic tenderness 12/01/2011   GERD (gastroesophageal reflux disease) 10/15/2011   Dysphagia, pharyngoesophageal 10/15/2011   Voiding dysfunction 09/24/2011   Hypertriglyceridemia 02/09/2011   VITAMIN D DEFICIENCY 12/13/2008   Constipation 12/13/2008   MUSCLE SPASM 12/13/2008   DIZZINESS 12/13/2008   HYPOKALEMIA, HX OF 12/13/2008   DISORDER, ADJUSTMENT W/DEPRESSED MOOD 04/30/2007   SCLEROSIS, MULTIPLE 04/30/2007   Essential hypertension 04/30/2007   VARICOSE VEIN, LWR EXTREMITIES W/INFLAMMATION 04/30/2007   VASOMOTOR RHINITIS 04/30/2007    Immunization History  Administered Date(s) Administered   Influenza Whole 05/07/2010, 06/06/2011   Influenza, Quadrivalent, Recombinant, Inj, Pf 04/21/2018, 05/11/2019, 05/11/2019   Influenza,inj,Quad PF,6+ Mos 05/10/2014, 04/23/2015, 04/23/2017   Influenza-Unspecified 04/24/2017, 05/21/2020, 06/26/2021, 07/09/2021   PFIZER(Purple Top)SARS-COV-2 Vaccination 08/17/2019, 09/07/2019, 10/21/2019, 05/21/2020   Pneumococcal Conjugate (Pcv15) 07/09/2021   Tdap 02/05/2011, 07/12/2015   Zoster Recombinat (Shingrix) 05/13/2019   Zoster, Live 05/10/2014     Patient is doing treatments every 6 weeks now for her RA and is glad this is less often. Patient is planning on going to 5 cities in [place] and leaves October 23rd. She has been there before and is looking forward to going back.  Patient is having pain in her legs right now and the ligament in her leg is slowly healing.  Patient reports  her BP is in the 140s mostly. She stopped taking the valsartan and she could not recall why. She is still taking the amlodipine but is not checking her BP that often. Patient has not brought her BP cuff into the  office before. Patient is not having headaches or chest pain.   Patient is working with a Clinical research associate at [employer] for this trip as well. Patient can walk and husband just retired so she plans on going on more walks with him.   Conditions to be addressed/monitored:  Hypertension, GERD, Osteopenia, Allergic Rhinitis, and MS, Muscle spasms, and UTI prevention  Conditions addressed this visit: Hypertension, Osteopenia  Care Plan : CCM Pharmacy Care Plan  Updates made by Viona Gilmore, [place] since 04/10/2022 12:00 AM     Problem: Problem: Hypertension, GERD, Muscle spasms, Allergic Rhinitis, MS, UTI prevention      Long-Range Goal: Patient-Specific Goal   Start Date: 01/30/2021  Expected End Date: 01/30/2022  Recent Progress: On track  Priority: High  Note:   Current Barriers:  Unable to independently monitor therapeutic efficacy Unable to achieve control of muscle spasms   Pharmacist Clinical Goal(s):  Patient will achieve adherence to monitoring guidelines and medication adherence to achieve therapeutic efficacy maintain control of blood pressure as evidenced by home blood pressure readings  through collaboration with PharmD and provider.   Interventions: 1:1 collaboration with [name], [name], MD regarding development and update of comprehensive plan of care as evidenced by provider attestation and co-signature Inter-disciplinary care team collaboration (see longitudinal plan of care) Comprehensive medication review performed; medication list updated in electronic medical record  Hypertension (BP goal <140/90) -Uncontrolled -Current treatment: Amlodipine 2.5 mg 1 tablet daily - Appropriate, Query effective, Safe, Accessible -Medications previously tried: valsartan (stopped on her own) -Current home readings: not checking often at home - owns a wrist cuff and it shows variable readings -Current dietary habits: seasons with salt a bit; eats very little processed foods and looks for  soups that are low sodium; doesn't eat out often -Current exercise habits: going to the gym twice a week -Denies hypotensive/hypertensive symptoms -Educated on BP goals and benefits of medications for prevention of heart attack, stroke and kidney damage; Importance of home blood pressure monitoring; Proper BP monitoring technique; Symptoms of hypotension and importance of maintaining adequate hydration; -Counseled to monitor BP at home weekly, document, and provide log at future appointments -Counseled on diet and exercise extensively Recommended to continue current medication Recommended for patient to purchase an arm cuff to replace the wrist cuff.  Osteopenia (Goal prevent fractures) -Uncontrolled -Last DEXA Scan: 04/28/2016 (could not locate scan from 06/22/18)  T-Score femoral neck: -1.2  T-Score total hip: n/a  T-Score lumbar spine: n/a  T-Score forearm radius: n/a  10-year probability of major osteoporotic fracture: 15.5%  10-year probability of hip fracture: 0.6% -Patient is not a candidate for pharmacologic treatment -Current treatment  Vitamin D 5000 units 1 tablet daily - Appropriate, Effective, Safe, Accessible Calcium citrate - dose unknown -Medications previously tried: Reclast (never started) -Recommend [phone] units of vitamin D daily. Recommend 1200 mg of calcium daily from dietary and supplemental sources. Recommend weight-bearing and muscle strengthening exercises for building and maintaining bone density. -Counseled on diet and exercise extensively Recommended repeat DEXA scan. Recommended supplementation with 600 mg of calcium citrate daily based on calcium intake.   Multiple sclerosis (Goal: minimize progression and symptoms) -Controlled -Current treatment  Tysabri 300 mg/45m inject every 6 weeks - Appropriate, Effective, Safe, Accessible -  Medications previously tried: Copaxone -Recommended to continue current medication  Foot pain (Goal: minimize  pain) -Controlled -Current treatment  No medications -Medications previously tried: meloxicam  -Counseled on using NSAIDs sparingly and trial of Tylenol for pain due to less risk of increasing BP or bleeding while taking NSAIDs  Muscle spasms (Goal: minimize symptoms) -Not ideally controlled -Current treatment  Gabapentin 300 mg 1 capsule at bedtime - Appropriate, Effective, Safe, Accessible Duloxetine 60 mg 1 capsule twice daily - Appropriate, Effective, Safe, Accessible Meloxicam 15 mg 1 tablet as needed - Appropriate, Query effective, Safe, Accessible -Medications previously tried: baclofen, methocarbamol -Recommended to continue current medication  GERD (Goal: minimize symptoms) -Controlled -Current treatment  Esomeprazole 20 mg 1-2 capsules daily as needed - Appropriate, Effective, Safe, Accessible -Medications previously tried: none  -Counseled on long term risks of taking PPIs such as fractures, vitamin B12 deficiency and infections Patient tried to step down to every other day and had a lot of GERD symptoms.  UTI prevention (Goal: prevent UTIs) -Controlled -Current treatment  Cranberry 2 capsules daily  Vitamin C 1000 mg 1 tablet daily Probiotic daily Uribel 118 mg 1 capsule daily as needed Cephalexin 500 mg daily -Medications previously tried: none  -Recommended to continue current medication   Health Maintenance -Vaccine gaps: COVID booster, Prevnar 20, shingrix (2nd dose) -Current therapy:  Flonase daily -Educated on Cost vs benefit of each product must be carefully weighed by individual consumer -Patient is satisfied with current therapy and denies issues -Recommended to continue current medication  Patient Goals/Self-Care Activities Patient will:  - take medications as prescribed check blood pressure weekly, document, and provide at future appointments target a minimum of 150 minutes of moderate intensity exercise weekly  Follow Up Plan: Telephone follow  up appointment with care management team member scheduled for: 6 months       Medication Assistance: None required.  Patient affirms current coverage meets needs.  Compliance/Adherence/Medication fill history: Care Gaps: Prevnar 20, second dose of shingrix, mammogram, COVID booster, influenza BP- 150/80 12/04/21  Star-Rating Drugs: Valsartan 80 mg - Last filled 01/05/22 90 DS at [name] Verified  Patient's preferred pharmacy is:  [facility] [id number], [name] [address] Phone: [phone] Fax: [phone]   Uses pill box? Yes - weekly (AM/PM) Pt endorses 95% compliance - 2-3 times a month (out of routine)  We discussed: Current pharmacy is preferred with insurance plan and patient is satisfied with pharmacy services Patient decided to: Continue current medication management strategy  Care Plan and Follow Up Patient Decision:  Patient agrees to Care Plan and Follow-up.  Plan: Telephone follow up appointment with care management team member scheduled for:  6 months  [name], PharmD, [place] Pharmacist [place] [facility] [place] [phone]

## 2022-04-01 ENCOUNTER — Ambulatory Visit (INDEPENDENT_AMBULATORY_CARE_PROVIDER_SITE_OTHER): Payer: Medicare Other | Admitting: Pharmacist

## 2022-04-01 ENCOUNTER — Encounter (HOSPITAL_BASED_OUTPATIENT_CLINIC_OR_DEPARTMENT_OTHER): Payer: Self-pay | Admitting: Urology

## 2022-04-01 ENCOUNTER — Other Ambulatory Visit: Payer: Self-pay

## 2022-04-01 DIAGNOSIS — Z5181 Encounter for therapeutic drug level monitoring: Secondary | ICD-10-CM | POA: Diagnosis not present

## 2022-04-01 DIAGNOSIS — K219 Gastro-esophageal reflux disease without esophagitis: Secondary | ICD-10-CM

## 2022-04-01 DIAGNOSIS — Z79899 Other long term (current) drug therapy: Secondary | ICD-10-CM | POA: Diagnosis not present

## 2022-04-01 DIAGNOSIS — G35 Multiple sclerosis: Secondary | ICD-10-CM | POA: Diagnosis not present

## 2022-04-01 DIAGNOSIS — I10 Essential (primary) hypertension: Secondary | ICD-10-CM

## 2022-04-01 DIAGNOSIS — T148XXA Other injury of unspecified body region, initial encounter: Secondary | ICD-10-CM

## 2022-04-01 HISTORY — DX: Other injury of unspecified body region, initial encounter: T14.8XXA

## 2022-04-01 NOTE — Progress Notes (Signed)
Spoke w/ via phone for pre-op interview---pt Lab needs dos---- I stat              Lab results------ekg 04-10-2021 chart/epic COVID test -----patient states asymptomatic no test needed Arrive at -------715 am 04-09-2021 NPO after MN NO Solid Food.  Clear liquids from MN until---615 am Med rec completed Medications to take morning of surgery -----amlodipine, nexium, cymbalta, flonase, eye drops prn Diabetic medication -----n/a Patient instructed no nail polish to be worn day of surgery Patient instructed to bring photo id and insurance card day of surgery Patient aware to have Driver (ride ) / caregiver  husband [name]   for 24 hours after surgery  Patient Special Instructions -----pt to bring own catheters day of surgery Pre-Op special Istructions -----n/a Patient verbalized understanding of instructions that were given at this phone interview. Patient denies shortness of breath, chest pain, fever, cough at this phone interview.   [facility] neurology dr [name] 02-18-2022 epic

## 2022-04-07 DIAGNOSIS — M79672 Pain in left foot: Secondary | ICD-10-CM | POA: Diagnosis not present

## 2022-04-09 ENCOUNTER — Ambulatory Visit (HOSPITAL_BASED_OUTPATIENT_CLINIC_OR_DEPARTMENT_OTHER): Payer: Medicare Other | Admitting: Anesthesiology

## 2022-04-09 ENCOUNTER — Ambulatory Visit (HOSPITAL_BASED_OUTPATIENT_CLINIC_OR_DEPARTMENT_OTHER)
Admission: RE | Admit: 2022-04-09 | Discharge: 2022-04-09 | Disposition: A | Discharge status: Home / Self Care | Payer: Medicare Other | Attending: Urology | Admitting: Urology

## 2022-04-09 ENCOUNTER — Encounter (HOSPITAL_BASED_OUTPATIENT_CLINIC_OR_DEPARTMENT_OTHER): Payer: Self-pay | Admitting: Urology

## 2022-04-09 ENCOUNTER — Encounter (HOSPITAL_BASED_OUTPATIENT_CLINIC_OR_DEPARTMENT_OTHER)
Admission: RE | Disposition: A | Discharge status: Home / Self Care | Payer: Self-pay | Source: Home / Self Care | Attending: Urology

## 2022-04-09 DIAGNOSIS — M1990 Unspecified osteoarthritis, unspecified site: Secondary | ICD-10-CM | POA: Insufficient documentation

## 2022-04-09 DIAGNOSIS — N312 Flaccid neuropathic bladder, not elsewhere classified: Secondary | ICD-10-CM | POA: Diagnosis not present

## 2022-04-09 DIAGNOSIS — N3942 Incontinence without sensory awareness: Secondary | ICD-10-CM | POA: Diagnosis present

## 2022-04-09 DIAGNOSIS — K219 Gastro-esophageal reflux disease without esophagitis: Secondary | ICD-10-CM | POA: Insufficient documentation

## 2022-04-09 DIAGNOSIS — G35 Multiple sclerosis: Secondary | ICD-10-CM | POA: Diagnosis not present

## 2022-04-09 DIAGNOSIS — Z01818 Encounter for other preprocedural examination: Secondary | ICD-10-CM

## 2022-04-09 DIAGNOSIS — N3941 Urge incontinence: Secondary | ICD-10-CM | POA: Insufficient documentation

## 2022-04-09 DIAGNOSIS — I10 Essential (primary) hypertension: Secondary | ICD-10-CM | POA: Insufficient documentation

## 2022-04-09 HISTORY — DX: Unspecified osteoarthritis, unspecified site: M19.90

## 2022-04-09 HISTORY — PX: BOTOX INJECTION: SHX5754

## 2022-04-09 HISTORY — DX: Neuromuscular dysfunction of bladder, unspecified: N31.9

## 2022-04-09 LAB — POCT I-STAT, CHEM 8
BUN: 13 mg/dL (ref 8–23)
Calcium, Ion: 1.23 mmol/L (ref 1.15–1.40)
Chloride: 102 mmol/L (ref 98–111)
Creatinine, Ser: 0.6 mg/dL (ref 0.44–1.00)
Glucose, Bld: 83 mg/dL (ref 70–99)
HCT: 34 % — ABNORMAL LOW (ref 36.0–46.0)
Hemoglobin: 11.6 g/dL — ABNORMAL LOW (ref 12.0–15.0)
Potassium: 4.3 mmol/L (ref 3.5–5.1)
Sodium: 143 mmol/L (ref 135–145)
TCO2: 27 mmol/L (ref 22–32)

## 2022-04-09 SURGERY — BOTOX INJECTION
Anesthesia: General | Site: Bladder

## 2022-04-09 MED ORDER — LIDOCAINE HCL (PF) 2 % IJ SOLN
INTRAMUSCULAR | Status: AC
  Filled 2022-04-09: qty 5

## 2022-04-09 MED ORDER — CEFAZOLIN SODIUM-DEXTROSE 2-4 GM/100ML-% IV SOLN
INTRAVENOUS | Status: AC
  Filled 2022-04-09: qty 100

## 2022-04-09 MED ORDER — CEFAZOLIN SODIUM-DEXTROSE 2-4 GM/100ML-% IV SOLN
2.0000 g | Freq: Once | INTRAVENOUS | Status: AC
  Administered 2022-04-09: 2 g via INTRAVENOUS

## 2022-04-09 MED ORDER — PROPOFOL 10 MG/ML IV BOLUS
INTRAVENOUS | Status: DC | PRN
  Administered 2022-04-09: 120 mg via INTRAVENOUS
  Administered 2022-04-09: 40 mg via INTRAVENOUS

## 2022-04-09 MED ORDER — ONABOTULINUMTOXINA 100 UNITS IJ SOLR
INTRAMUSCULAR | Status: DC | PRN
  Administered 2022-04-09: 300 [IU] via INTRAMUSCULAR

## 2022-04-09 MED ORDER — PHENYLEPHRINE 80 MCG/ML (10ML) SYRINGE FOR IV PUSH (FOR BLOOD PRESSURE SUPPORT)
PREFILLED_SYRINGE | INTRAVENOUS | Status: AC
  Filled 2022-04-09: qty 10

## 2022-04-09 MED ORDER — SODIUM CHLORIDE (PF) 0.9 % IJ SOLN
INTRAMUSCULAR | Status: DC | PRN
  Administered 2022-04-09: 30 mL

## 2022-04-09 MED ORDER — ONDANSETRON HCL 4 MG/2ML IJ SOLN: 4.0000 mg | Freq: Four times a day (QID) | INTRAMUSCULAR | Status: DC | PRN

## 2022-04-09 MED ORDER — OXYCODONE HCL 5 MG PO TABS: 5.0000 mg | ORAL_TABLET | Freq: Once | ORAL | Status: DC | PRN

## 2022-04-09 MED ORDER — ONDANSETRON HCL 4 MG/2ML IJ SOLN
INTRAMUSCULAR | Status: DC | PRN
  Administered 2022-04-09: 4 mg via INTRAVENOUS

## 2022-04-09 MED ORDER — STERILE WATER FOR IRRIGATION IR SOLN
Status: DC | PRN
  Administered 2022-04-09: 3000 mL

## 2022-04-09 MED ORDER — PROPOFOL 10 MG/ML IV BOLUS
INTRAVENOUS | Status: AC
  Filled 2022-04-09: qty 20

## 2022-04-09 MED ORDER — MIDAZOLAM HCL 5 MG/5ML IJ SOLN
INTRAMUSCULAR | Status: DC | PRN
  Administered 2022-04-09 (×2): 1 mg via INTRAVENOUS

## 2022-04-09 MED ORDER — LIDOCAINE 2% (20 MG/ML) 5 ML SYRINGE
INTRAMUSCULAR | Status: DC | PRN
  Administered 2022-04-09: 40 mg via INTRAVENOUS

## 2022-04-09 MED ORDER — FENTANYL CITRATE (PF) 100 MCG/2ML IJ SOLN: 25.0000 ug | INTRAMUSCULAR | Status: DC | PRN

## 2022-04-09 MED ORDER — LACTATED RINGERS IV SOLN: INTRAVENOUS | Status: DC

## 2022-04-09 MED ORDER — DEXAMETHASONE SODIUM PHOSPHATE 10 MG/ML IJ SOLN
INTRAMUSCULAR | Status: DC | PRN
  Administered 2022-04-09: 10 mg via INTRAVENOUS

## 2022-04-09 MED ORDER — EPHEDRINE 5 MG/ML INJ
INTRAVENOUS | Status: AC
  Filled 2022-04-09: qty 5

## 2022-04-09 MED ORDER — MIDAZOLAM HCL 2 MG/2ML IJ SOLN
INTRAMUSCULAR | Status: AC
  Filled 2022-04-09: qty 2

## 2022-04-09 MED ORDER — DEXAMETHASONE SODIUM PHOSPHATE 10 MG/ML IJ SOLN
INTRAMUSCULAR | Status: AC
  Filled 2022-04-09: qty 1

## 2022-04-09 MED ORDER — ONDANSETRON HCL 4 MG/2ML IJ SOLN
INTRAMUSCULAR | Status: AC
  Filled 2022-04-09: qty 2

## 2022-04-09 MED ORDER — FENTANYL CITRATE (PF) 100 MCG/2ML IJ SOLN
INTRAMUSCULAR | Status: DC | PRN
  Administered 2022-04-09: 50 ug via INTRAVENOUS

## 2022-04-09 MED ORDER — PHENYLEPHRINE 80 MCG/ML (10ML) SYRINGE FOR IV PUSH (FOR BLOOD PRESSURE SUPPORT)
PREFILLED_SYRINGE | INTRAVENOUS | Status: DC | PRN
  Administered 2022-04-09: 80 ug via INTRAVENOUS
  Administered 2022-04-09: 160 ug via INTRAVENOUS

## 2022-04-09 MED ORDER — FENTANYL CITRATE (PF) 100 MCG/2ML IJ SOLN
INTRAMUSCULAR | Status: AC
  Filled 2022-04-09: qty 2

## 2022-04-09 MED ORDER — ARTIFICIAL TEARS OPHTHALMIC OINT
TOPICAL_OINTMENT | OPHTHALMIC | Status: AC
  Filled 2022-04-09: qty 3.5

## 2022-04-09 MED ORDER — OXYCODONE HCL 5 MG/5ML PO SOLN: 5.0000 mg | Freq: Once | ORAL | Status: DC | PRN

## 2022-04-09 SURGICAL SUPPLY — 16 items
BAG DRAIN URO-CYSTO SKYTR STRL (DRAIN) ×1 IMPLANT
BAG DRN UROCATH (DRAIN) ×1
CLOTH BEACON ORANGE TIMEOUT ST (SAFETY) ×1 IMPLANT
GLOVE BIO SURGEON STRL SZ7.5 (GLOVE) ×1 IMPLANT
GLOVE BIOGEL PI IND STRL 6.5 (GLOVE) IMPLANT
GLOVE ECLIPSE 6.5 STRL STRAW (GLOVE) IMPLANT
GOWN STRL REUS W/TWL LRG LVL3 (GOWN DISPOSABLE) ×3 IMPLANT
KIT TURNOVER CYSTO (KITS) ×1 IMPLANT
MANIFOLD NEPTUNE II (INSTRUMENTS) ×1 IMPLANT
NDL ASPIRATION 22 (NEEDLE) IMPLANT
NDL SAFETY ECLIP 18X1.5 (MISCELLANEOUS) IMPLANT
NEEDLE ASPIRATION 22 (NEEDLE) ×1 IMPLANT
PACK CYSTO (CUSTOM PROCEDURE TRAY) ×1 IMPLANT
SYR 10ML LL (SYRINGE) IMPLANT
TUBE CONNECTING 12X1/4 (SUCTIONS) ×1 IMPLANT
WATER STERILE IRR 3000ML UROMA (IV SOLUTION) ×1 IMPLANT

## 2022-04-09 NOTE — Anesthesia Postprocedure Evaluation (Signed)
Anesthesia Post Note  Patient: [name]  Procedure(s) Performed: BOTOX INJECTION 300 units WITH CYSTOSCOPY (Bladder)     Patient location during evaluation: PACU Anesthesia Type: General Level of consciousness: awake and alert Pain management: pain level controlled Vital Signs Assessment: post-procedure vital signs reviewed and stable Respiratory status: spontaneous breathing, nonlabored ventilation, respiratory function stable and patient connected to nasal cannula oxygen Cardiovascular status: blood pressure returned to baseline and stable Postop Assessment: no apparent nausea or vomiting Anesthetic complications: no   No notable events documented.  Last Vitals:  Vitals:   04/09/22 0945 04/09/22 1007  BP: 134/68 (!) 145/65  Pulse: 69 74  Resp: 15 16  Temp:  36.5 C  SpO2: 96% 99%    Last Pain:  Vitals:   04/09/22 1007  TempSrc:   PainSc: 0-No pain                 Fransisco Messmer S

## 2022-04-09 NOTE — Anesthesia Preprocedure Evaluation (Signed)
Anesthesia Evaluation  Patient identified by MRN, date of birth, ID band Patient awake    Reviewed: Allergy & Precautions, H&P , NPO status , Patient's Chart, lab work & pertinent test results  Airway Mallampati: II   Neck ROM: full    Dental   Pulmonary neg pulmonary ROS,    breath sounds clear to auscultation       Cardiovascular hypertension,  Rhythm:regular Rate:Normal     Neuro/Psych Multiple sclerosis    GI/Hepatic GERD  ,  Endo/Other    Renal/GU    Neurogenic bladder    Musculoskeletal  (+) Arthritis ,   Abdominal   Peds  Hematology   Anesthesia Other Findings   Reproductive/Obstetrics                             Anesthesia Physical Anesthesia Plan  ASA: 3  Anesthesia Plan: General   Post-op Pain Management:    Induction: Intravenous  PONV Risk Score and Plan: 3 and Ondansetron, Dexamethasone and Treatment may vary due to age or medical condition  Airway Management Planned: LMA  Additional Equipment:   Intra-op Plan:   Post-operative Plan: Extubation in OR  Informed Consent: I have reviewed the patients History and Physical, chart, labs and discussed the procedure including the risks, benefits and alternatives for the proposed anesthesia with the patient or authorized representative who has indicated his/her understanding and acceptance.     Dental advisory given  Plan Discussed with: CRNA, Anesthesiologist and Surgeon  Anesthesia Plan Comments:         Anesthesia Quick Evaluation

## 2022-04-09 NOTE — H&P (Signed)
PRE-OP H&P  Office Visit Report     03/27/2022   --------------------------------------------------------------------------------   [name]  MRN: [id number]  DOB: 02-25-1956, 66 year old Female  PRIMARY CARE:  Shanon Ace, MD  REFERRING:  Glena Norfolk. Lovena Neighbours, MD  PROVIDER:  Ellison Hughs, M.D.  LOCATION:  [facility] [phone]     --------------------------------------------------------------------------------   CC/HPI: CC: Bladder infections   HPI: [name] is a 66 year old female with a history multiple sclerosis incontinence w/o sensation and recurrent urinary tract infections. She performs self-catheterization BID/TID when she feels suprapubic fullness/discomfort. s/p intravesical botox injections (300 units) on 04/10/21.   04/13/19: Patient with above noted history. She presents today with concerns for UTI. She states that currently she is having increased issues with her MS and is going for further testing this week. She states that for the past 1-2 weeks she has been having increased incontinence between spontaneous voids and CIC. She continues to perform CIC x 2-3 times daily. She denies difficulties performing CIC, but did not some discomfort earlier this week. She denies gross hematuria or fevers. She has been using Uribel, which does help. She states that she was treated about 6 weeks ago for an episode of cystitis, but it has been approximately one year prior to this event. She remains on keflex, cranberry tablets, D-mannose, probiotics and Vit C for UTI prevention.   02/29/20: The patient his here today for a f/u after developing dysuria and a suspected UTI. She states that her urinary sxs cleared after a course of keflex. She had a urine culture performed on 7/28 that showed no growth. UA today is clear. She continues to perform CIC w/o difficulty. Of note, she is frustrated with the lack of communication between the triage and nursing staff  concerning her recent issues.   10/31/2020: Patient with above noted past medical history. She is currently not having any issues but is going on a cruise and wanted to ensure she was not having any infections. She also wanted something for urgency and frequency if needed. She currently denies any increased urgency, frequency, dysuria. Her urinalysis is not overly concerning for an infection today. She recently picked up her cephalexin which she takes as a suppressive antibiotic. Marland Kitchen She is going on a cruise to [place] and is very excited about this.   03/11/21: [name] for a routine follow-up. Diarrhea has worsened over the pat 6 months--currently on daily keflex. She was prescribed oxybutynin at her last visit, but admits that she has not started the medication out of concern for dry eye side effects. She reports that she is completely incontinent and wears 3-5 pads per day. She continues to perform CIC 3 times daily w/o any discomfort.   04/26/21: [name] for a routine follow-up after her recent botox injections. She reports marked improvement in her incontinence. She is not requiring protective pads (was previously wearing 3-5). No issues cathing and denies malodorous urine, hematuria or dysuria.   09/16/21: [name] for a routine follow-up. The patient states that her incontinence remains very well managed by her previous Botox injections. She reports minimal incontinence and is not requiring any protective pads. She continues to perform CIC every 4-6 hours throughout the day. She denies any interval UTIs or dysuria. She does note 1 episode of gross hematuria immediately following catheterization that quickly resolved.   03/17/22: [name] for a routine  follow-up. She is doing well and reports no new health issues. She notes that over the past 3 to 4 weeks she has seen an increase in urinary urgency and urge incontinence. She continues to  catheterize every 4-6 hours without any pain. She denies interval UTIs, dysuria or gross hematuria. She continues to take Keflex 250 mg daily for UTI prophylaxis.   03/27/22: The patient presents back today with crampy lower abdominal pain and increased urinary urgency. Her urine culture from May 2023 showed no growth and her UA today is unremarkable. She denies any significant dysuria, flank pain or hematuria. She does have intermittent issues with constipation, but states that her bowel movements have been regular for her. She denies fevers or chills. No issues cathing.     ALLERGIES: Meclizine HCl TABS - Other Reaction, hallucination Omeprazole TBEC - Hives Tramadol - Itching, Hives    MEDICATIONS: Cephalexin 250 mg capsule 1 capsule PO Daily  Oxybutynin Chloride 5 mg tablet 1 tablet PO BID PRN  Amlodipine Besylate 2.5 mg tablet  Biotin  Cranberry  Cymbalta 30 mg capsule,delayed release  Florajen Acidophilus 20 billion cell capsule 1 capsule PO Daily  Gabapentin 600 mg tablet  NexIUM PACK Oral  Tysabri 300 MG/15ML Intravenous Concentrate Intravenous  Uribel 118 mg-10 mg-40.8 mg-36 mg-0.12 mg capsule 1 capsule PO Q 8 H PRN Take as needed for painful urination  Vitamin B12  Vitamin C  Vitamin D3 5000 UNIT Oral Tablet Oral     GU PSH: Cystourethroscopy, W/Injection For Chemodenervation Of Bladder - 10/25/2021, 04/10/2021 Hysterectomy Unilat SO - 2013       PSH Notes: Bladder Surgery   NON-GU PSH: Rotator Cuff Surgery.. Shoulder Surgery (Unspecified)     GU PMH: Areflexic bladder - 03/17/2022, - 09/16/2021, - 04/26/2021, - 03/11/2021, - 10/31/2020, - 2019, - 2017, Hypotonic bladder, - 2016 Chronic cystitis (w/o hematuria) - 03/17/2022, - 09/16/2021, - 03/11/2021, - 2019, - 2017, Chronic cystitis, - 2015 Incontinence w/o Sensation - 09/16/2021, - 04/26/2021, - 03/11/2021, Urinary incontinence without sensory awareness, - 2016 Acute Cystitis/UTI - 2019 Dysuria, Dysuria - 2016 Pelvic/perineal  pain, Abdominal pain, suprapubic - 2015 Urinary Tract Inf, Unspec site, Pyuria - 2015, Urinary tract infection, - 2014    NON-GU PMH: Multiple sclerosis - 03/17/2022, - 03/11/2021, Multiple sclerosis, - 2016 Encounter for general adult medical examination without abnormal findings, Encounter for preventive health examination - 2015 Anxiety, Anxiety (Symptom) - 2014 Personal history of other mental and behavioral disorders, History of depression - 2014 Personal history of other specified conditions, History of heartburn - 2014 Migraine, unspecified, not intractable, without status migrainosus    FAMILY HISTORY: Death In The Family Father - Father Family Health Status - Mother's Age - Mother Family Health Status Number - Runs In Family Lung Cancer - Father   SOCIAL HISTORY: Marital Status: Married Preferred Language: English; Ethnicity: Not Hispanic Or Latino; Race: White Current Smoking Status: Patient has never smoked.  Does not use smokeless tobacco. Drinks 2 drinks per day.  Does not use drugs. Drinks 2 caffeinated drinks per day. Patient's occupation is/was Retired.    REVIEW OF SYSTEMS:    GU Review Female:   Patient denies frequent urination, hard to postpone urination, burning /pain with urination, get up at night to urinate, leakage of urine, stream starts and stops, trouble starting your stream, have to strain to urinate, and being pregnant.  Gastrointestinal (Upper):   Patient denies nausea, vomiting, and indigestion/ heartburn.  Gastrointestinal (Lower):   Patient denies diarrhea  and constipation.  Constitutional:   Patient denies fever, night sweats, weight loss, and fatigue.  Skin:   Patient denies skin rash/ lesion and itching.  Eyes:   Patient denies blurred vision and double vision.  Ears/ Nose/ Throat:   Patient denies sore throat and sinus problems.  Hematologic/Lymphatic:   Patient denies swollen glands and easy bruising.  Cardiovascular:   Patient denies leg  swelling and chest pains.  Respiratory:   Patient denies cough and shortness of breath.  Endocrine:   Patient denies excessive thirst.  Musculoskeletal:   Patient denies joint pain and back pain.  Neurological:   Patient denies headaches and dizziness.  Psychologic:   Patient denies depression and anxiety.   VITAL SIGNS:      03/27/2022 08:56 AM  Weight 155 lb / 70.31 kg  Height 65 in / 165.1 cm  BP 133/75 mmHg  Pulse 84 /min  Temperature 97.7 F / 36.5 C  BMI 25.8 kg/m   PAST DATA REVIEW: None   PROCEDURES: None   ASSESSMENT:      ICD-10 Details  1 GU:   Areflexic bladder - N31.2   2   Incontinence w/o Sensation - N39.42   3   Pelvic/perineal pain - R10.2      PLAN:           Schedule Return Visit/Planned Activity: Keep Scheduled Appointment          Document Letter(s):  Created for Patient: Clinical Summary         Notes:   -Her lower abdominal/pelvic pain is likely secondary to the effects of Botox wearing off in combination with MS. She is scheduled for repeat Botox injection on 04/09/2022 and I recommend that we keep that operative date. I offered oral OAB treatment, but she deferred. Recommend Uribel, NSAIDs and heating pad as needed for lower abdominal pain.

## 2022-04-09 NOTE — Transfer of Care (Signed)
Immediate Anesthesia Transfer of Care Note  Patient: [name]  Procedure(s) Performed: BOTOX INJECTION 300 units WITH CYSTOSCOPY (Bladder)  Patient Location: PACU  Anesthesia Type:General  Level of Consciousness: awake, alert , oriented and patient cooperative  Airway & Oxygen Therapy: Patient Spontanous Breathing  Post-op Assessment: Report given to RN and Post -op Vital signs reviewed and stable  Post vital signs: Reviewed and stable  Last Vitals:  Vitals Value Taken Time  BP 120/59 04/09/22 0924  Temp    Pulse 72 04/09/22 0926  Resp 15 04/09/22 0926  SpO2 96 % 04/09/22 0926  Vitals shown include unvalidated device data.  Last Pain:  Vitals:   04/09/22 0755  TempSrc: Oral  PainSc: 3       Patients Stated Pain Goal: 5 (22/41/14 6431)  Complications: No notable events documented.

## 2022-04-09 NOTE — Anesthesia Procedure Notes (Signed)
Procedure Name: LMA Insertion Date/Time: 04/09/2022 9:00 AM  Performed by: Rogers Blocker, CRNAPre-anesthesia Checklist: Patient identified, Emergency Drugs available, Suction available and Patient being monitored Patient Re-evaluated:Patient Re-evaluated prior to induction Oxygen Delivery Method: Circle System Utilized Preoxygenation: Pre-oxygenation with 100% oxygen Induction Type: IV induction Ventilation: Mask ventilation without difficulty LMA: LMA inserted LMA Size: 4.0 Number of attempts: 1 Placement Confirmation: positive ETCO2 Tube secured with: Tape Dental Injury: Teeth and Oropharynx as per pre-operative assessment

## 2022-04-09 NOTE — Op Note (Signed)
Operative Note  Preoperative diagnosis:  1.  Urge incontinence  2.  Multiple sclerosis  Postoperative diagnosis: 1.  Urge incontinence  2.  Multiple sclerosis  Procedure(s): 1.  Cystoscopy with intravesical Botox injections  Surgeon: Ellison Hughs, MD  Assistants:  None  Anesthesia:  General  Complications:  None  EBL: Less than 5 mL  Specimens: 1.  None  Drains/Catheters: 1.  None  Intraoperative findings:   Moderate bladder trabeculation with no other intravesical abnormalities  Indication:  [name] is a 66 y.o. female with a history of multiple sclerosis and urge incontinence.  She is here today for Botox injections.  She has been consented for the above procedures, voices understanding and wishes to proceed.  Description of procedure:  After informed consent was obtained, the patient was brought to the operating room and general LMA anesthesia was administered. The patient was then placed in the dorsolithotomy position and prepped and draped in the usual sterile fashion. A timeout was performed. A 23 French rigid cystoscope was then inserted into the urethral meatus and advanced into the bladder under direct vision. A complete bladder survey revealed no intravesical pathology.   I then injected a total of 300 units of Botox diluted in 30 mL of saline in 0.5 mL aliquots throughout the detrusor musculature, and a grid like fashion.  There was no evidence of bleeding following the injections.  The patient's bladder was then partially drained.  She tolerated the procedure well and was transferred to the postanesthesia in stable condition.  Plan: Discharge home.  Resume clean remittent catheterization every 4-6 hours

## 2022-04-09 NOTE — Discharge Instructions (Addendum)
  Post Anesthesia Home Care Instructions  Activity: Get plenty of rest for the remainder of the day. A responsible adult should stay with you for 24 hours following the procedure.  For the next 24 hours, DO NOT: -Drive a car -Paediatric nurse -Drink alcoholic beverages -Take any medication unless instructed by your physician -Make any legal decisions or sign important papers.  Meals: Start with liquid foods such as gelatin or soup. Progress to regular foods as tolerated. Avoid greasy, spicy, heavy foods. If nausea and/or vomiting occur, drink only clear liquids until the nausea and/or vomiting subsides. Call your physician if vomiting continues.  Special Instructions/Symptoms: Your throat may feel dry or sore from the anesthesia or the breathing tube placed in your throat during surgery. If this causes discomfort, gargle with warm salt water. The discomfort should disappear within 24 hours.   CYSTOSCOPY HOME CARE INSTRUCTIONS  Activity: Rest for the remainder of the day.  Do not drive or operate equipment today.  You may resume normal activities in one to two days as instructed by your physician.   Meals: Drink plenty of liquids and eat light foods such as gelatin or soup this evening.  You may return to a normal meal plan tomorrow.  Return to Work: You may return to work in one to two days or as instructed by your physician.  Special Instructions / Symptoms: Call your physician if any of these symptoms occur:   -persistent or heavy bleeding  -bleeding which continues after first few urination  -large blood clots that are difficult to pass  -urine stream diminishes or stops completely  -fever equal to or higher than 101 degrees Farenheit.  -cloudy urine with a strong, foul odor  -severe pain  Females should always wipe from front to back after elimination.  You may feel some burning pain when you urinate.  This should disappear with time.  Applying moist heat to the lower  abdomen or a hot tub bath may help relieve the pain. \

## 2022-04-10 ENCOUNTER — Encounter (HOSPITAL_BASED_OUTPATIENT_CLINIC_OR_DEPARTMENT_OTHER): Payer: Self-pay | Admitting: Urology

## 2022-04-10 NOTE — Patient Instructions (Signed)
Hi Anda Kraft,  It was great to catch up again! Don't forget to keep a log of your home BP readings before seeing Dr. Regis Bill in October and it would be a good idea to bring in your cuff to this visit as well.  Please reach out to me if you have any questions or need anything before our follow up!  Best, Maddie  [name], PharmD, [facility]   Visit Information   Goals Addressed   None    Patient Care Plan: CCM Pharmacy Care Plan     Problem Identified: Problem: Hypertension, GERD, Muscle spasms, Allergic Rhinitis, MS, UTI prevention      Long-Range Goal: Patient-Specific Goal   Start Date: 01/30/2021  Expected End Date: 01/30/2022  Recent Progress: On track  Priority: High  Note:   Current Barriers:  Unable to independently monitor therapeutic efficacy Unable to achieve control of muscle spasms   Pharmacist Clinical Goal(s):  Patient will achieve adherence to monitoring guidelines and medication adherence to achieve therapeutic efficacy maintain control of blood pressure as evidenced by home blood pressure readings  through collaboration with PharmD and provider.   Interventions: 1:1 collaboration with [name], [name], MD regarding development and update of comprehensive plan of care as evidenced by provider attestation and co-signature Inter-disciplinary care team collaboration (see longitudinal plan of care) Comprehensive medication review performed; medication list updated in electronic medical record  Hypertension (BP goal <140/90) -Uncontrolled -Current treatment: Amlodipine 2.5 mg 1 tablet daily - Appropriate, Query effective, Safe, Accessible -Medications previously tried: valsartan (stopped on her own) -Current home readings: not checking often at home - owns a wrist cuff and it shows variable readings -Current dietary habits: seasons with salt a bit; eats very little processed foods and looks for soups  that are low sodium; doesn't eat out often -Current exercise habits: going to the gym twice a week -Denies hypotensive/hypertensive symptoms -Educated on BP goals and benefits of medications for prevention of heart attack, stroke and kidney damage; Importance of home blood pressure monitoring; Proper BP monitoring technique; Symptoms of hypotension and importance of maintaining adequate hydration; -Counseled to monitor BP at home weekly, document, and provide log at future appointments -Counseled on diet and exercise extensively Recommended to continue current medication Recommended for patient to purchase an arm cuff to replace the wrist cuff.  Osteopenia (Goal prevent fractures) -Uncontrolled -Last DEXA Scan: 04/28/2016 (could not locate scan from 06/22/18)  T-Score femoral neck: -1.2  T-Score total hip: n/a  T-Score lumbar spine: n/a  T-Score forearm radius: n/a  10-year probability of major osteoporotic fracture: 15.5%  10-year probability of hip fracture: 0.6% -Patient is not a candidate for pharmacologic treatment -Current treatment  Vitamin D 5000 units 1 tablet daily - Appropriate, Effective, Safe, Accessible Calcium citrate - dose unknown -Medications previously tried: Reclast (never started) -Recommend [phone] units of vitamin D daily. Recommend 1200 mg of calcium daily from dietary and supplemental sources. Recommend weight-bearing and muscle strengthening exercises for building and maintaining bone density. -Counseled on diet and exercise extensively Recommended repeat DEXA scan. Recommended supplementation with 600 mg of calcium citrate daily based on calcium intake.   Multiple sclerosis (Goal: minimize progression and symptoms) -Controlled -Current treatment  Tysabri 300 mg/56m inject every 6 weeks - Appropriate, Effective, Safe, Accessible -Medications previously tried: Copaxone -Recommended to continue current medication  Foot pain (Goal: minimize  pain) -Controlled -Current treatment  No medications -Medications previously tried: meloxicam  -Counseled on using NSAIDs sparingly  and trial of Tylenol for pain due to less risk of increasing BP or bleeding while taking NSAIDs  Muscle spasms (Goal: minimize symptoms) -Not ideally controlled -Current treatment  Gabapentin 300 mg 1 capsule at bedtime - Appropriate, Effective, Safe, Accessible Duloxetine 60 mg 1 capsule twice daily - Appropriate, Effective, Safe, Accessible Meloxicam 15 mg 1 tablet as needed - Appropriate, Query effective, Safe, Accessible -Medications previously tried: baclofen, methocarbamol -Recommended to continue current medication  GERD (Goal: minimize symptoms) -Controlled -Current treatment  Esomeprazole 20 mg 1-2 capsules daily as needed - Appropriate, Effective, Safe, Accessible -Medications previously tried: none  -Counseled on long term risks of taking PPIs such as fractures, vitamin B12 deficiency and infections Patient tried to step down to every other day and had a lot of GERD symptoms.  UTI prevention (Goal: prevent UTIs) -Controlled -Current treatment  Cranberry 2 capsules daily  Vitamin C 1000 mg 1 tablet daily Probiotic daily Uribel 118 mg 1 capsule daily as needed Cephalexin 500 mg daily -Medications previously tried: none  -Recommended to continue current medication   Health Maintenance -Vaccine gaps: COVID booster, Prevnar 20, shingrix (2nd dose) -Current therapy:  Flonase daily -Educated on Cost vs benefit of each product must be carefully weighed by individual consumer -Patient is satisfied with current therapy and denies issues -Recommended to continue current medication  Patient Goals/Self-Care Activities Patient will:  - take medications as prescribed check blood pressure weekly, document, and provide at future appointments target a minimum of 150 minutes of moderate intensity exercise weekly  Follow Up Plan: Telephone follow  up appointment with care management team member scheduled for: 6 months       Patient verbalizes understanding of instructions and care plan provided today and agrees to view in [place]. Active MyChart status and patient understanding of how to access instructions and care plan via MyChart confirmed with patient.    Telephone follow up appointment with pharmacy team member scheduled for: 6 months  Viona Gilmore, [facility]

## 2022-04-13 DIAGNOSIS — R339 Retention of urine, unspecified: Secondary | ICD-10-CM | POA: Diagnosis not present

## 2022-04-17 DIAGNOSIS — M79672 Pain in left foot: Secondary | ICD-10-CM | POA: Diagnosis not present

## 2022-04-22 DIAGNOSIS — M79672 Pain in left foot: Secondary | ICD-10-CM | POA: Diagnosis not present

## 2022-04-26 DIAGNOSIS — M810 Age-related osteoporosis without current pathological fracture: Secondary | ICD-10-CM | POA: Diagnosis not present

## 2022-04-26 DIAGNOSIS — I10 Essential (primary) hypertension: Secondary | ICD-10-CM | POA: Diagnosis not present

## 2022-05-01 DIAGNOSIS — M25562 Pain in left knee: Secondary | ICD-10-CM | POA: Diagnosis not present

## 2022-05-02 DIAGNOSIS — M79672 Pain in left foot: Secondary | ICD-10-CM | POA: Diagnosis not present

## 2022-05-03 ENCOUNTER — Other Ambulatory Visit: Payer: Self-pay | Admitting: Internal Medicine

## 2022-05-06 DIAGNOSIS — L7211 Pilar cyst: Secondary | ICD-10-CM | POA: Diagnosis not present

## 2022-05-06 DIAGNOSIS — L608 Other nail disorders: Secondary | ICD-10-CM | POA: Diagnosis not present

## 2022-05-06 DIAGNOSIS — D2262 Melanocytic nevi of left upper limb, including shoulder: Secondary | ICD-10-CM | POA: Diagnosis not present

## 2022-05-06 DIAGNOSIS — D2271 Melanocytic nevi of right lower limb, including hip: Secondary | ICD-10-CM | POA: Diagnosis not present

## 2022-05-06 DIAGNOSIS — D2272 Melanocytic nevi of left lower limb, including hip: Secondary | ICD-10-CM | POA: Diagnosis not present

## 2022-05-06 DIAGNOSIS — D225 Melanocytic nevi of trunk: Secondary | ICD-10-CM | POA: Diagnosis not present

## 2022-05-06 DIAGNOSIS — L821 Other seborrheic keratosis: Secondary | ICD-10-CM | POA: Diagnosis not present

## 2022-05-06 DIAGNOSIS — Z85828 Personal history of other malignant neoplasm of skin: Secondary | ICD-10-CM | POA: Diagnosis not present

## 2022-05-06 DIAGNOSIS — L814 Other melanin hyperpigmentation: Secondary | ICD-10-CM | POA: Diagnosis not present

## 2022-05-06 DIAGNOSIS — D2261 Melanocytic nevi of right upper limb, including shoulder: Secondary | ICD-10-CM | POA: Diagnosis not present

## 2022-05-06 DIAGNOSIS — L603 Nail dystrophy: Secondary | ICD-10-CM | POA: Diagnosis not present

## 2022-05-07 DIAGNOSIS — R339 Retention of urine, unspecified: Secondary | ICD-10-CM | POA: Diagnosis not present

## 2022-05-12 DIAGNOSIS — G35 Multiple sclerosis: Secondary | ICD-10-CM | POA: Diagnosis not present

## 2022-05-12 DIAGNOSIS — Z79899 Other long term (current) drug therapy: Secondary | ICD-10-CM | POA: Diagnosis not present

## 2022-05-13 ENCOUNTER — Ambulatory Visit (INDEPENDENT_AMBULATORY_CARE_PROVIDER_SITE_OTHER): Payer: Medicare Other | Admitting: Internal Medicine

## 2022-05-13 ENCOUNTER — Encounter: Payer: Self-pay | Admitting: Internal Medicine

## 2022-05-13 VITALS — BP 147/89 | HR 75 | Temp 97.4°F | Ht 64.98 in | Wt 158.6 lb

## 2022-05-13 DIAGNOSIS — G35 Multiple sclerosis: Secondary | ICD-10-CM

## 2022-05-13 DIAGNOSIS — Z9181 History of falling: Secondary | ICD-10-CM | POA: Diagnosis not present

## 2022-05-13 DIAGNOSIS — Z79899 Other long term (current) drug therapy: Secondary | ICD-10-CM

## 2022-05-13 DIAGNOSIS — I10 Essential (primary) hypertension: Secondary | ICD-10-CM

## 2022-05-13 DIAGNOSIS — Z0000 Encounter for general adult medical examination without abnormal findings: Secondary | ICD-10-CM | POA: Diagnosis not present

## 2022-05-13 MED ORDER — LOSARTAN POTASSIUM 25 MG PO TABS: 25.0000 mg | ORAL_TABLET | Freq: Every day | ORAL | 1 refills | Status: DC

## 2022-05-13 NOTE — Patient Instructions (Addendum)
Good to see you today .  Add  losartan dose evey day and let us know if you feel you have to stop. Plan 3 mos fu BP  and will need to repeat  bmp chemistry panel then if staying on medicatio Have a great trip to [place].

## 2022-05-13 NOTE — Progress Notes (Signed)
Chief Complaint  Patient presents with   Annual Exam   Hypertension    HPI: Patient  [name]  66 y.o. comes in today for Preventive Health Care visit  And Bp follow up   Botox  for urine    urologist to follow .   Dr Dickie La office    osteopenia  and to be seenin January   Bp rising up  at times has not really followed up was on valsartan and it helped her blood pressure and not sure why she stopped it but thought it might have been a side effect this was probably 4 to 6 months ago on low dose amlodipine to avoid se   MS slow deterioration at this time.Daughter just dx with MS distress although they are traveling for 3 weeks to [place] and looking forward to it.  No recent fracture or new fall had a fall and a little indentation left on her left upper arm. Taking 5000 units of vitamin D last vitamin D level was 82 and in correct range. Health Maintenance  Topic Date Due   Zoster Vaccines- Shingrix (2 of 2) 07/08/2019   COVID-19 Vaccine (5 - Pfizer risk series) 05/29/2022 (Originally 07/16/2020)   MAMMOGRAM  05/13/2024   COLONOSCOPY (Pts 45-59yr Insurance coverage will need to be confirmed)  01/09/2025   TETANUS/TDAP  07/11/2025   Pneumonia Vaccine 66 Years old  Completed   INFLUENZA VACCINE  Completed   DEXA SCAN  Completed   HPV VACCINES  Aged Out   Hepatitis C Screening  Discontinued   Health Maintenance Review LIFESTYLE:  Exercise:  slow decline  physical trainer 2 x per week and pt  working on  Tobacco/ETS: no Alcohol:  2 per day  wine  Sugar beverages: n Sleep: 9-10 Drug use: no HH of  2  1 cat   On fall in feb  better left arms lump   ROS:    may need cataract  GEN/ HEENT: No fever, significant weight changes sweats headaches vision problems hearing changes, CV/ PULM; No chest pain shortness of breath cough, syncope,edema  change in exercise tolerance. GI /GU: No adominal pain, vomiting, change in bowel habits. No blood in the  stool.SKIN/HEME: ,no acute skin rashes suspicious lesions or bleeding. No lymphadenopathy, nodules, masses.  IMM/ Allergy: No unusual infections.  Allergy .   REST of 12 system review negative except as per HPI   Past Medical History:  Diagnosis Date   Ambulates with cane 04/08/2021   prn occ walker prn   Arthritis    left knee   Concussion 2019   no residual   Diverticulosis    GERD (gastroesophageal reflux disease)    Hypertension    [place]   Neurogenic bladder    Self-catheterizes urinary bladder 04/08/2021   self caths 3 to 4 x daily   S[place]ogren's syndrome ([place] 10/23[place]20[place]   runs vaporizer in room @ night to help with   Torn ligament 04/01/2022   left knee   Uses walker 04/08/2021   prn   Wears glasses for reading     Past Surgical History:  Procedure Laterality Date   ABDOMINAL HYSTERECTOMY  2008   partial   BLADDER SURGERY  2008   Tack   BOTOX INJECTION N/A 04/10/2021   Procedure: BOTOX INJECTION WITH CYSTOSCOPY;  Surgeon: WCeasar Mons MD;  [facility]  Service: Urology;  Laterality: N/A;   BOTOX INJECTION N/A  10/25/2021   Procedure: CYSTOSCOPY WITH BOTOX INJECTION 300 UNITS;  Surgeon: Ceasar Mons, MD;  Location: [facility];  Service: Urology;  Laterality: N/A;   BOTOX INJECTION N/A 04/09/2022   Procedure: BOTOX INJECTION 300 units WITH CYSTOSCOPY;  Surgeon: Ceasar Mons, MD;  Location[facility];  Service: Urology;  Laterality: N/A;   CATARACT EXTRACTION Left 2022   SHOULDER ARTHROSCOPY WITH ROTATOR CUFF REPAIR AND SUBACROMIAL DECOMPRESSION  06/01/2012   Procedure: SHOULDER ARTHROSCOPY WITH ROTATOR CUFF REPAIR AND SUBACROMIAL DECOMPRESSION;  Surgeon: Magnus Sinning, MD;  Location: [place];  Service: Orthopedics;  Laterality: Left;  LEFT SHOULDER ARTHROSCOPY WITH SUBACROMIAL DECOMPRESION AND DEBRIDEMENT OF PARTIAL ROTATOR  CUFF TEAR   TONSILLECTOMY AND ADENOIDECTOMY  1962    Family History  Problem Relation Age of Onset   Lung cancer Father    Colon cancer Neg Hx     Social History   Socioeconomic History   Marital status: Married    Spouse name: Not on file   Number of children: 4   Years of education: Not on file   Highest education level: Not on file  Occupational History   Occupation: Disability  Tobacco Use   Smoking status: Never    Passive exposure: Never   Smokeless tobacco: Never  Vaping Use   Vaping Use: Never used  Substance and Sexual Activity   Alcohol use: Yes    Comment: 2 glasses wine per day   Drug use: No   Sexual activity: Not Currently  Other Topics Concern   Not on file  Social History Narrative   Unable to volunteer    Now on disability   Children now out of home   Daily caffeine    Social Determinants of Health   Financial Resource Strain: Low Risk  (08/12/2021)   Overall Financial Resource Strain (CARDIA)    Difficulty of Paying Living Expenses: Not hard at all  Food Insecurity: No Food Insecurity (08/12/2021)   Hunger Vital Sign    Worried About Running Out of Food in the Last Year: Never true    [place] in the Last Year: Never true  Transportation Needs: No Transportation Needs (08/12/2021)   PRAPARE - Hydrologist (Medical): No    Lack of Transportation (Non-Medical): No  Physical Activity: Insufficiently Active (08/12/2021)   Exercise Vital Sign    Days of Exercise per Week: 3 days    Minutes of Exercise per Session: 20 min  Stress: No Stress Concern Present (08/12/2021)   [place]    Feeling of Stress : Not at all  Social Connections: [place] (08/20/2020)   Social Connection and Isolation Panel [NHANES]    Frequency of Communication with Friends and Family: More than three times a week    Frequency of Social Gatherings with Friends and  Family: More than three times a week    Attends Religious Services: More than 4 times per year    Active Member of [employer] or Organizations: Yes    Attends Music therapist: More than 4 times per year    Marital Status: Married    Outpatient Medications Prior to Visit  Medication Sig Dispense Refill   amLODipine (NORVASC) 2.5 MG tablet TAKE ONE TABLET BY MOUTH DAILY 90 tablet 3   antiseptic oral rinse (BIOTENE) LIQD 15 mLs by Mouth Rinse route as needed for dry mouth.  Ascorbic Acid 500 MG CAPS Take 500 mg by mouth daily.     Biotin 10000 MCG TABS Take by mouth daily at 6 (six) AM. 1000 mg daily     Calcium Citrate-Vitamin D (CALCIUM CITRATE+D3 PO) Take 600 mg by mouth daily.     Cholecalciferol (VITAMIN D-3) 5000 UNITS TABS Take 1 tablet by mouth daily. 2000 units daily     CRANBERRY PO Take 1 capsule by mouth in the morning and at bedtime. 500 mg     Cyanocobalamin (VITAMIN B 12) 500 MCG TABS Take 1,000 mcg by mouth daily. 30 tablet 11   D-MANNOSE PO Take 1 capsule by mouth in the morning and at bedtime.     DULoxetine (CYMBALTA) 60 MG capsule Take 1 capsule by mouth 2 (two) times daily.     esomeprazole (NEXIUM) 20 MG capsule Take 20 mg by mouth daily.     fluticasone (FLONASE) 50 MCG/ACT nasal spray Place 1 spray into both nostrils daily.     gabapentin (NEURONTIN) 300 MG capsule Take 300 mg by mouth at bedtime.     ibuprofen (ADVIL) 200 MG tablet Take 600 mg by mouth every 6 (six) hours as needed. 3 x weekly takes bid     Meth-Hyo-M Bl-Na Phos-Ph Sal (URIBEL) 118 MG CAPS Take 1 capsule by mouth daily as needed (bladder spasms). Using PRN     natalizumab (TYSABRI) 300 MG/15ML injection Inject into the vein. Every  6 weeks     Polyethyl Glycol-Propyl Glycol 0.4-0.3 % SOLN Apply to eye. Systane dry eye drops prn     Probiotic Product (PROBIOTIC PEARLS) CAPS Take 1 capsule by mouth in the morning and at bedtime.     White Petrolatum-Mineral Oil (SYSTANE NIGHTTIME) OINT Apply  to eye at bedtime.     cephALEXin (KEFLEX) 500 MG capsule Take 500 mg by mouth at bedtime.     cephALEXin (KEFLEX) 250 MG capsule Take 250 mg by mouth daily.     HYDROcodone-acetaminophen (NORCO/VICODIN) 5-325 MG tablet Take 1 tablet by mouth every 4 (four) hours as needed.     predniSONE (DELTASONE) 10 MG tablet Take by mouth.     No facility-administered medications prior to visit.     EXAM:  BP (!) 147/89 (BP Location: Right Arm, Cuff Size: Normal) Comment: with Automatic BP machine from home  Pulse 75   Temp (!) 97.4 F (36.3 C) (Oral)   Ht 5' 4.98" (1.65 m)   Wt 158 lb 9.6 oz (71.9 kg)   SpO2 98%   BMI 26.41 kg/m   Body mass index is 26.41 kg/m. Wt Readings from Last 3 Encounters:  05/13/22 158 lb 9.6 oz (71.9 kg)  04/09/22 158 lb 1.6 oz (71.7 kg)  12/04/21 156 lb 9.6 oz (71 kg)    Physical Exam: Vital signs reviewed YQM:VHQI is a well-developed well-nourished alert cooperative    who appearsr stated age in no acute distress.  Has a walker ambulatory with caution lower extremity weakness. HEENT: normocephalic atraumatic , Eyes: PERRL EOM's full, conjunctiva clear, Nares: paten,t no deformity discharge or tenderness., Ears: no deformity EAC's clear TMs with normal landmarks. Mouth: clear OP, no lesions, edema.  Moist mucous membranes. Dentition in adequate repair. NECK: supple without masses, thyromegaly or bruits. CHEST/PULM:  Clear to auscultation and percussion breath sounds equal no wheeze , rales or rhonchi. No chest wall deformities or tenderness. Breast: normal by inspection . No dimpling, discharge, masses, tenderness or discharge . CV: PMI is nondisplaced, S1 S2  no gallops, murmurs, rubs. Peripheral pulses are full without delay..  ABDOMEN: Bowel sounds normal nontender  No guard or rebound, no hepato splenomegal no CVA tenderness.  No hernia. Extremtities:  No clubbing cyanosis or edema, no acute joint swellin weakness right lower extremity able to get up on exam  table by self without assistance NEURO:  Oriented x3, cranial nerves 3-12 appear to be intact, gait unsteady right foot weakness SKIN: No acute rashes normal turgor, color, no bruising or petechiae. PSYCH: Oriented, good eye contact, no obvious depression anxiety, cognition and judgment appear normal. LN: no cervical axillary adenopathy  Lab Results  Component Value Date   WBC 9.9 10/22/2021   HGB 11.6 (L) 04/09/2022   HCT 34.0 (L) 04/09/2022   PLT 248 10/22/2021   GLUCOSE 83 04/09/2022   CHOL 172 04/24/2021   TRIG 112.0 04/24/2021   HDL 55.80 04/24/2021   LDLDIRECT 110.0 04/23/2015   LDLCALC 94 04/24/2021   ALT 20 09/24/2021   AST 17 09/24/2021   NA 143 04/09/2022   K 4.3 04/09/2022   CL 102 04/09/2022   CREATININE 0.60 04/09/2022   BUN 13 04/09/2022   CO2 29 (A) 09/24/2021   TSH 1.12 04/24/2021   HGBA1C 5.2 02/13/2014    BP Readings from Last 3 Encounters:  05/13/22 (!) 147/89  04/09/22 (!) 145/65  12/04/21 (!) 150/80   Last vit d was  36 a year ago  Lab results reviewed with patient   ASSESSMENT AND PLAN:  Discussed the following assessment and plan:    ICD-10-CM   1. Visit for preventive health examination  Z00.00     2. Medication management  Z79.899     3. Essential hypertension  I10    need optimization add losartan and update readings  and 3 mos update fu lab utd     4. Hx of falling  Z91.81     5. SCLEROSIS, MULTIPLE  G35     6. High risk medication use  Z79.899      Return in about 3 months (around 08/13/2022) for bp check .  Patient Care Team: Burnis Medin, MD as PCP - Silvestre Moment, MD as Referring Physician (Obstetrics and Gynecology) Ceasar Mons, MD as Consulting Physician (Urology) Sydnee Levans, MD as Consulting Physician (Dermatology) Karl Luke, MD as Referring Physician (Neurology) Viona Gilmore, [facility] as Pharmacist (Pharmacist) Patient Instructions  Good to see you today .  Add  losartan dose  evey day and let us know if you feel you have to stop. Plan 3 mos fu BP  and will need to repeat  bmp chemistry panel then if staying on medicatio Have a great trip to [place].  [name]. Rhydian Baldi M.D.

## 2022-05-15 DIAGNOSIS — M79672 Pain in left foot: Secondary | ICD-10-CM | POA: Diagnosis not present

## 2022-05-30 ENCOUNTER — Telehealth: Payer: Self-pay | Admitting: Pharmacist

## 2022-05-30 NOTE — Chronic Care Management (AMB) (Signed)
Chronic Care Management Pharmacy Assistant   Name: [name]  MRN: [id number] DOB: [birth year]  Reason for Encounter: Disease State   Conditions to be addressed/monitored: HTN  Recent office visits:  05/13/22 Burnis Medin, MD  - Patient presented for Preventative health exam and other concerns. Prescribed Losartan. Decreased Cephalexin.  Recent consult visits:  05/12/22 Karl Luke, MD  - Patient presented for Multiple sclerosis and other concerns. No medication changes.  05/01/22 Caryl Never, MD ([facility]) - Patient presented for Pain in left knee and other concerns. No medication changes.  Hospital visits:  Medication Reconciliation was completed by comparing discharge summary, patient's EMR and Pharmacy list, and upon discussion with patient.  Patient presented to [facility] on 04/09/22 due to Botox Injection with cytoscopy. Patient was present for 3 hours.  New?Medications Started at [facility] Discharge:?? -started  none  Medication Changes at Hospital Discharge: -Changed  none  Medications Discontinued at Hospital Discharge: -Stopped  none  Medications that remain the same after Hospital Discharge:??  -All other medications will remain the same.    Medications: Outpatient Encounter Medications as of 05/30/2022  Medication Sig Note   amLODipine (NORVASC) 2.5 MG tablet TAKE ONE TABLET BY MOUTH DAILY    antiseptic oral rinse (BIOTENE) LIQD 15 mLs by Mouth Rinse route as needed for dry mouth.    Ascorbic Acid 500 MG CAPS Take 500 mg by mouth daily.    Biotin 10000 MCG TABS Take by mouth daily at 6 (six) AM. 1000 mg daily    Calcium Citrate-Vitamin D (CALCIUM CITRATE+D3 PO) Take 600 mg by mouth daily.    cephALEXin (KEFLEX) 250 MG capsule Take 250 mg by mouth daily.    Cholecalciferol (VITAMIN D-3) 5000 UNITS TABS Take 1 tablet by mouth daily. 2000 units daily    CRANBERRY PO Take 1 capsule by mouth in the morning and at  bedtime. 500 mg    Cyanocobalamin (VITAMIN B 12) 500 MCG TABS Take 1,000 mcg by mouth daily.    D-MANNOSE PO Take 1 capsule by mouth in the morning and at bedtime.    DULoxetine (CYMBALTA) 60 MG capsule Take 1 capsule by mouth 2 (two) times daily.    esomeprazole (NEXIUM) 20 MG capsule Take 20 mg by mouth daily. 04/03/2015: She takes otc version   fluticasone (FLONASE) 50 MCG/ACT nasal spray Place 1 spray into both nostrils daily.    gabapentin (NEURONTIN) 300 MG capsule Take 300 mg by mouth at bedtime.    HYDROcodone-acetaminophen (NORCO/VICODIN) 5-325 MG tablet Take 1 tablet by mouth every 4 (four) hours as needed.    ibuprofen (ADVIL) 200 MG tablet Take 600 mg by mouth every 6 (six) hours as needed. 3 x weekly takes bid    losartan (COZAAR) 25 MG tablet Take 1 tablet (25 mg total) by mouth daily. For high BP instead of valsartan    Meth-Hyo-M Bl-Na Phos-Ph Sal (URIBEL) 118 MG CAPS Take 1 capsule by mouth daily as needed (bladder spasms). Using PRN    natalizumab (TYSABRI) 300 MG/15ML injection Inject into the vein. Every  6 weeks    Polyethyl Glycol-Propyl Glycol 0.4-0.3 % SOLN Apply to eye. Systane dry eye drops prn    predniSONE (DELTASONE) 10 MG tablet Take by mouth.    Probiotic Product (PROBIOTIC PEARLS) CAPS Take 1 capsule by mouth in the morning and at bedtime.    White Petrolatum-Mineral Oil (SYSTANE NIGHTTIME) OINT Apply to eye at bedtime.    No  facility-administered encounter medications on file as of 05/30/2022.   Reviewed chart prior to disease state call. Spoke with patient regarding BP  Recent Office Vitals: BP Readings from Last 3 Encounters:  05/13/22 (!) 147/89  04/09/22 (!) 145/65  12/04/21 (!) 150/80   Pulse Readings from Last 3 Encounters:  05/13/22 75  04/09/22 74  12/04/21 79    Wt Readings from Last 3 Encounters:  05/13/22 158 lb 9.6 oz (71.9 kg)  04/09/22 158 lb 1.6 oz (71.7 kg)  12/04/21 156 lb 9.6 oz (71 kg)     Kidney Function Lab Results  Component  Value Date/Time   CREATININE 0.60 04/09/2022 08:10 AM   CREATININE 0.60 10/25/2021 11:19 AM   GFR 94.52 08/12/2021 02:10 PM   GFRNONAA >60 04/03/2015 10:35 AM   GFRAA >60 04/03/2015 10:35 AM       Latest Ref Rng & Units 04/09/2022    8:10 AM 10/25/2021   11:19 AM 09/24/2021   12:00 AM  BMP  Glucose 70 - 99 mg/dL 83  82    BUN 8 - 23 mg/dL '13  13  8      '$ Creatinine 0.44 - 1.00 mg/dL 0.60  0.60  0.6      Sodium 135 - 145 mmol/L 143  140  139      Potassium 3.5 - 5.1 mmol/L 4.3  4.2  3.6      Chloride 98 - 111 mmol/L 102  102  102      CO2 13 - 22   29      Calcium 8.7 - 10.7   9.1         This result is from an external source.    Current antihypertensive regimen:  Amlodipine 2.5 mg 1 tablet daily - Appropriate, Query   Unable to reach for assessment   Care Gaps: Zoster Vaccine - Overdue COVID Booster - Overdue CCM- 3/24 BP- 147/89 05/13/22 AWV- 1/24 (scheduled)  Star Rating Drugs: Valsartan 80 mg - Last filled 01/05/22 90 DS at [place] Pharmacist Assistant [phone]

## 2022-06-10 ENCOUNTER — Encounter: Payer: Self-pay | Admitting: Internal Medicine

## 2022-06-10 NOTE — Progress Notes (Signed)
[facility] 

## 2022-06-12 DIAGNOSIS — M79672 Pain in left foot: Secondary | ICD-10-CM | POA: Diagnosis not present

## 2022-06-16 DIAGNOSIS — M25579 Pain in unspecified ankle and joints of unspecified foot: Secondary | ICD-10-CM | POA: Diagnosis not present

## 2022-06-18 DIAGNOSIS — R339 Retention of urine, unspecified: Secondary | ICD-10-CM | POA: Diagnosis not present

## 2022-06-24 DIAGNOSIS — G35 Multiple sclerosis: Secondary | ICD-10-CM | POA: Diagnosis not present

## 2022-06-24 DIAGNOSIS — M546 Pain in thoracic spine: Secondary | ICD-10-CM | POA: Diagnosis not present

## 2022-06-24 DIAGNOSIS — Z79899 Other long term (current) drug therapy: Secondary | ICD-10-CM | POA: Diagnosis not present

## 2022-06-24 DIAGNOSIS — M545 Low back pain, unspecified: Secondary | ICD-10-CM | POA: Diagnosis not present

## 2022-06-25 DIAGNOSIS — M25579 Pain in unspecified ankle and joints of unspecified foot: Secondary | ICD-10-CM | POA: Diagnosis not present

## 2022-07-02 DIAGNOSIS — M25579 Pain in unspecified ankle and joints of unspecified foot: Secondary | ICD-10-CM | POA: Diagnosis not present

## 2022-07-08 DIAGNOSIS — M25579 Pain in unspecified ankle and joints of unspecified foot: Secondary | ICD-10-CM | POA: Diagnosis not present

## 2022-07-09 DIAGNOSIS — R339 Retention of urine, unspecified: Secondary | ICD-10-CM | POA: Diagnosis not present

## 2022-07-17 ENCOUNTER — Ambulatory Visit (INDEPENDENT_AMBULATORY_CARE_PROVIDER_SITE_OTHER): Payer: Medicare Other | Admitting: Adult Health

## 2022-07-17 ENCOUNTER — Ambulatory Visit (INDEPENDENT_AMBULATORY_CARE_PROVIDER_SITE_OTHER): Payer: Medicare Other

## 2022-07-17 VITALS — BP 130/70 | HR 85 | Temp 98.1°F | Wt 162.0 lb

## 2022-07-17 DIAGNOSIS — M545 Low back pain, unspecified: Secondary | ICD-10-CM

## 2022-07-17 DIAGNOSIS — S32010A Wedge compression fracture of first lumbar vertebra, initial encounter for closed fracture: Secondary | ICD-10-CM | POA: Diagnosis not present

## 2022-07-17 DIAGNOSIS — M47816 Spondylosis without myelopathy or radiculopathy, lumbar region: Secondary | ICD-10-CM | POA: Diagnosis not present

## 2022-07-17 DIAGNOSIS — I10 Essential (primary) hypertension: Secondary | ICD-10-CM | POA: Diagnosis not present

## 2022-07-17 DIAGNOSIS — M533 Sacrococcygeal disorders, not elsewhere classified: Secondary | ICD-10-CM | POA: Diagnosis not present

## 2022-07-17 LAB — BASIC METABOLIC PANEL
BUN: 15 mg/dL (ref 6–23)
CO2: 31 mEq/L (ref 19–32)
Calcium: 9.4 mg/dL (ref 8.4–10.5)
Chloride: 102 mEq/L (ref 96–112)
Creatinine, Ser: 0.6 mg/dL (ref 0.40–1.20)
GFR: 92.35 mL/min (ref >60.00)
Glucose, Bld: 87 mg/dL (ref 70–99)
Potassium: 4 mEq/L (ref 3.5–5.1)
Sodium: 141 mEq/L (ref 135–145)
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
## 2022-07-17 NOTE — Patient Instructions (Signed)
It was great meeting you today   Your blood pressure well controlled.   I am going to do some blood work and will also check some xray of your lower back today

## 2022-07-22 ENCOUNTER — Encounter (HOSPITAL_COMMUNITY): Payer: Self-pay | Admitting: *Deleted

## 2022-07-22 ENCOUNTER — Emergency Department (HOSPITAL_COMMUNITY)
Admission: EM | Admit: 2022-07-22 | Discharge: 2022-07-22 | Disposition: A | Discharge status: Home / Self Care | Payer: Medicare Other | Attending: Emergency Medicine | Admitting: Emergency Medicine

## 2022-07-22 ENCOUNTER — Emergency Department (HOSPITAL_COMMUNITY): Payer: Medicare Other

## 2022-07-22 ENCOUNTER — Telehealth: Payer: Self-pay | Admitting: Internal Medicine

## 2022-07-22 ENCOUNTER — Other Ambulatory Visit: Payer: Self-pay

## 2022-07-22 DIAGNOSIS — R531 Weakness: Secondary | ICD-10-CM | POA: Diagnosis not present

## 2022-07-22 DIAGNOSIS — R6889 Other general symptoms and signs: Secondary | ICD-10-CM

## 2022-07-22 DIAGNOSIS — Z20828 Contact with and (suspected) exposure to other viral communicable diseases: Secondary | ICD-10-CM

## 2022-07-22 DIAGNOSIS — R509 Fever, unspecified: Secondary | ICD-10-CM | POA: Diagnosis not present

## 2022-07-22 DIAGNOSIS — Z79899 Other long term (current) drug therapy: Secondary | ICD-10-CM | POA: Insufficient documentation

## 2022-07-22 DIAGNOSIS — Z743 Need for continuous supervision: Secondary | ICD-10-CM | POA: Diagnosis not present

## 2022-07-22 LAB — BASIC METABOLIC PANEL
Anion gap: 13 (ref 5–15)
BUN: 10 mg/dL (ref 8–23)
CO2: 20 mmol/L — ABNORMAL LOW (ref 22–32)
Calcium: 9.2 mg/dL (ref 8.9–10.3)
Chloride: 102 mmol/L (ref 98–111)
Creatinine, Ser: 0.62 mg/dL (ref 0.44–1.00)
GFR, Estimated: >60 mL/min (ref >60)
Glucose, Bld: 141 mg/dL — ABNORMAL HIGH (ref 70–99)
Potassium: 3.5 mmol/L (ref 3.5–5.1)
Sodium: 135 mmol/L (ref 135–145)

## 2022-07-22 LAB — CBC WITH DIFFERENTIAL/PLATELET
Abs Immature Granulocytes: 0.04 10*3/uL (ref 0.00–0.07)
Basophils Absolute: 0 10*3/uL (ref 0.0–0.1)
Basophils Relative: 0 %
Eosinophils Absolute: 0.1 10*3/uL (ref 0.0–0.5)
Eosinophils Relative: 1 %
HCT: 38.2 % (ref 36.0–46.0)
Hemoglobin: 13.4 g/dL (ref 12.0–15.0)
Immature Granulocytes: 0 %
Lymphocytes Relative: 10 %
Lymphs Abs: 0.9 10*3/uL (ref 0.7–4.0)
MCH: 32 pg (ref 26.0–34.0)
MCHC: 35.1 g/dL (ref 30.0–36.0)
MCV: 91.2 fL (ref 80.0–100.0)
Monocytes Absolute: 0.7 10*3/uL (ref 0.1–1.0)
Monocytes Relative: 7 %
Neutro Abs: 7.2 10*3/uL (ref 1.7–7.7)
Neutrophils Relative %: 82 %
Platelets: 208 10*3/uL (ref 150–400)
RBC: 4.19 MIL/uL (ref 3.87–5.11)
RDW: 13 % (ref 11.5–15.5)
WBC: 8.9 10*3/uL (ref 4.0–10.5)
nRBC: 0.8 % — ABNORMAL HIGH (ref 0.0–0.2)

## 2022-07-22 MED ORDER — ONDANSETRON 4 MG PO TBDP: 4.0000 mg | ORAL_TABLET | Freq: Three times a day (TID) | ORAL | 0 refills | Status: DC | PRN

## 2022-07-22 MED ORDER — OSELTAMIVIR PHOSPHATE 75 MG PO CAPS: 75.0000 mg | ORAL_CAPSULE | Freq: Two times a day (BID) | ORAL | 0 refills | Status: DC

## 2022-07-22 NOTE — ED Triage Notes (Signed)
BIB EMS pt spent time with grandchildren who had the flu. 3 days of ? Flu, called Neurologist who told her to come to ED get fluids and Tamaflu. 128/76-90-95% 100.4 after Tylenol 2 hours ago. Pt has MS.

## 2022-07-22 NOTE — Discharge Instructions (Signed)
Follow these instructions at home: Take over-the-counter and prescription medicines only as told by your health care provider. Use a cool mist humidifier to add humidity to the air in your home. This can make breathing easier. Rest as needed. Drink enough fluid to keep your urine clear or pale yellow. Cover your mouth and nose when you cough or sneeze. Wash your hands with soap and water often, especially after you cough or sneeze. If soap and water are not available, use hand sanitizer. Stay home from work or school as told by your health care provider. Unless you are visiting your health care provider, try to avoid leaving home until your fever has been gone for 24 hours without the use of medicine. Keep all follow-up visits as told by your health care provider. This is important. Contact a health care provider if: You develop new symptoms. You have: Chest pain. Diarrhea. A fever. Your cough gets worse. You produce more mucus. You feel nauseous or you vomit. Get help right away if: You develop shortness of breath or difficulty breathing. Your skin or nails turn a bluish color. You have severe pain or stiffness in your neck. You develop a sudden headache or sudden pain in your face or ear. You cannot stop vomiting.

## 2022-07-22 NOTE — ED Provider Notes (Signed)
[facility] DEPT Provider Note   CSN: [id number] Arrival date & time: 07/22/22  1801     History  Chief Complaint  Patient presents with   Influenza    [name] is a 66 y.o. female with hx of MS. Flu sxs x 3 days. Recent exposure to flu. Neurologist told her to come to the ER for fluids and tamiflu.  Patient states that she is too weak to walk.  She thinks is because of the fluid she has never had this before.  She normally has some decreased ability to ambulate due to her MS.    Influenza      Home Medications Prior to Admission medications   Medication Sig Start Date End Date Taking? Authorizing Provider  amLODipine (NORVASC) 2.5 MG tablet TAKE ONE TABLET BY MOUTH DAILY 05/07/22   [name], [name], MD  antiseptic oral rinse (BIOTENE) LIQD 15 mLs by Mouth Rinse route as needed for dry mouth.    [provider]  Ascorbic Acid 500 MG CAPS Take 500 mg by mouth daily.    [provider]  Biotin 10000 MCG TABS Take by mouth daily at 6 (six) AM. 1000 mg daily    [provider]  Calcium Citrate-Vitamin D (CALCIUM CITRATE+D3 PO) Take 600 mg by mouth daily.    [provider]  cephALEXin (KEFLEX) 250 MG capsule Take 250 mg by mouth daily. 03/17/22   [provider]  Cholecalciferol (VITAMIN D-3) 5000 UNITS TABS Take 1 tablet by mouth daily. 2000 units daily    [provider]  CRANBERRY PO Take 1 capsule by mouth in the morning and at bedtime. 500 mg    [provider]  Cyanocobalamin (VITAMIN B 12) 500 MCG TABS Take 1,000 mcg by mouth daily. 10/25/21   Ceasar Mons, MD  D-MANNOSE PO Take 1 capsule by mouth in the morning and at bedtime.    [provider]  DULoxetine (CYMBALTA) 60 MG capsule Take 1 capsule by mouth 2 (two) times daily. 08/18/19   [provider]  esomeprazole (NEXIUM) 20 MG capsule Take 20 mg by mouth daily.    [provider]   fluticasone (FLONASE) 50 MCG/ACT nasal spray Place 1 spray into both nostrils daily.    [provider]  gabapentin (NEURONTIN) 300 MG capsule Take 300 mg by mouth at bedtime. 04/15/19   [provider]  HYDROcodone-acetaminophen (NORCO/VICODIN) 5-325 MG tablet Take 1 tablet by mouth every 4 (four) hours as needed. 12/19/21   [provider]  ibuprofen (ADVIL) 200 MG tablet Take 600 mg by mouth every 6 (six) hours as needed. 3 x weekly takes bid    [provider]  losartan (COZAAR) 25 MG tablet Take 1 tablet (25 mg total) by mouth daily. For high BP instead of valsartan 05/13/22   [name], [name], MD  Meth-Hyo-M Bl-Na Phos-Ph Sal (URIBEL) 118 MG CAPS Take 1 capsule by mouth daily as needed (bladder spasms). Using PRN    [provider]  natalizumab (TYSABRI) 300 MG/15ML injection Inject into the vein. Every  6 weeks    [provider]  Polyethyl Glycol-Propyl Glycol 0.4-0.3 % SOLN Apply to eye. Systane dry eye drops prn    [provider]  predniSONE (DELTASONE) 10 MG tablet Take by mouth. 11/20/21   [provider]  Probiotic Product (PROBIOTIC PEARLS) CAPS Take 1 capsule by mouth in the morning and at bedtime.    [provider]  White Petrolatum-Mineral Oil (SYSTANE NIGHTTIME) OINT Apply to eye at bedtime.    [provider]      Allergies    Meclizine hcl, Tramadol, Omeprazole magnesium, Prilosec [omeprazole magnesium], and Tramadol hcl    Review of Systems   Review of Systems  Physical Exam Updated Vital Signs BP 106/65 (BP Location: Left Arm)   Pulse 90   Temp 99.3 F (37.4 C) (Oral)   Resp 18   Wt 73.5 kg   SpO2 95%   BMI 26.97 kg/m  Physical Exam Vitals and nursing note reviewed.  Constitutional:      General: She is not in acute distress.    Appearance: She is well-developed. She is ill-appearing. She is not diaphoretic.     Comments: Appears globally weak  HENT:     Head:  Normocephalic and atraumatic.     Right Ear: External ear normal.     Left Ear: External ear normal.     Nose: Nose normal.     Mouth/Throat:     Mouth: Mucous membranes are moist.  Eyes:     General: No scleral icterus.    Conjunctiva/sclera: Conjunctivae normal.  Cardiovascular:     Rate and Rhythm: Normal rate and regular rhythm.     Heart sounds: Normal heart sounds. No murmur heard.    No friction rub. No gallop.  Pulmonary:     Effort: Pulmonary effort is normal. No respiratory distress.     Breath sounds: Normal breath sounds.  Abdominal:     General: Bowel sounds are normal. There is no distension.     Palpations: Abdomen is soft. There is no mass.     Tenderness: There is no abdominal tenderness. There is no guarding.  Musculoskeletal:     Cervical back: Normal range of motion.  Skin:    General: Skin is warm and dry.  Neurological:     Mental Status: She is alert and oriented to person, place, and time.  Psychiatric:        Behavior: Behavior normal.     ED Results / Procedures / Treatments   Labs (all labs ordered are listed, but only abnormal results are displayed) Labs Reviewed  CBC WITH DIFFERENTIAL/PLATELET - Abnormal; Notable for the following components:      Result Value   nRBC 0.8 (*)    All other components within normal limits  BASIC METABOLIC PANEL - Abnormal; Notable for the following components:   CO2 20 (*)    Glucose, Bld 141 (*)    All other components within normal limits  RESP PANEL BY RT-PCR (RSV, FLU A&B, COVID)  RVPGX2  URINALYSIS, ROUTINE W REFLEX MICROSCOPIC    EKG None  Radiology DG Chest 2 View  Result Date: 07/22/2022 CLINICAL DATA:  Weakness EXAM: CHEST - 2 VIEW COMPARISON:  July 12, 2015 FINDINGS: Anterior wedging of L1 is stable since July 17, 2022 lumbar x-rays. The heart, hila, mediastinum, lungs, and pleura are otherwise unremarkable. IMPRESSION: No active cardiopulmonary disease. Stable anterior wedging of L1.  Electronically Signed   By: Dorise Bullion III M.D.   On: 07/22/2022 18:39    Procedures Procedures    Medications Ordered in ED Medications - No data to display  ED Course/ Medical Decision Making/ A&P Clinical Course as of 07/22/22 2136  Tue Jul 22, 2022  2133 Patient now able to stand and ambulate at baseline [AH]    Clinical Course User Index [AH] Margarita Mail, PA-C  Medical Decision Making Patient here with generalized weakness and flulike symptoms  The differential diagnosis of weakness includes but is not limited to neurologic causes (GBS, myasthenia gravis, CVA, MS, ALS, transverse myelitis, spinal cord injury, CVA, botulism, ) and other causes: ACS, Arrhythmia, syncope, orthostatic hypotension, sepsis, hypoglycemia, electrolyte disturbance, hypothyroidism, respiratory failure, symptomatic anemia, dehydration, heat injury, polypharmacy, malignancy.  Concern for fatigue from influenza or flulike illness versus MS flare.  I visualized and interpreted labs which show no acute findings  Unfortunately respiratory panel did not run.  2 view checks x-ray shows no acute findings.  I personally visualized and interpreted these results.  After sitting for about 4 hours and a wheelchair near the triage desk patient was able to stand and states that she was feeling much better and would like to be discharged.  Patient showed me that she could stand with the wheelchair which is her baseline.  Will discharge with Tamiflu and Zofran.  Discussed outpatient follow-up and return precautions.  Amount and/or Complexity of Data Reviewed Labs: ordered. Radiology: ordered.  Risk Prescription drug management.           Final Clinical Impression(s) / ED Diagnoses Final diagnoses:  None    Rx / DC Orders ED Discharge Orders     None         Margarita Mail, PA-C 07/22/22 [place], [place], DO 07/23/22 1558

## 2022-07-22 NOTE — ED Provider Triage Note (Signed)
Emergency Medicine Provider Triage Evaluation Note  [name] , a 66 y.o. female  was evaluated in triage.  Pt complains of with MS. Flu sxs x 3 days. Recent exposure to flu. Neurologist told her to come to the ER for fluids and tamiflu.  Patient states that she is too weak to walk.  She thinks is because of the fluid she has never had this before.  She normally has some decreased ability to ambulate due to her MS.  Review of Systems  Positive:  Negative:   Physical Exam  There were no vitals taken for this visit. Gen:   Awake, no distress   Resp:  Normal effort  MSK:   Moves extremities without difficulty  Other:  Appears well hydrated.  Medical Decision Making  Medically screening exam initiated at 6:11 PM.  Appropriate orders placed.  [name] was informed that the remainder of the evaluation will be completed by another provider, this initial triage assessment does not replace that evaluation, and the importance of remaining in the ED until their evaluation is complete.     Margarita Mail, PA-C 07/22/22 1818

## 2022-07-22 NOTE — Telephone Encounter (Signed)
Imaging results for lumbar spine, requesting a call

## 2022-07-23 ENCOUNTER — Telehealth: Payer: Self-pay

## 2022-07-23 NOTE — Telephone Encounter (Signed)
I spoke with Diane with [facility] radiology and she wanted to confirm that we have received lumbar spine x-ray. X-ray was received and Diane stated the patient has age indeterminate compression fracture of L1. Diane wanted a provider to view in the absence of PCP.

## 2022-07-24 NOTE — Telephone Encounter (Signed)
Patient is aware of the result. Inform her someone should contact her regarding to her MRI appt. Verbalized understanding.

## 2022-07-30 DIAGNOSIS — M25579 Pain in unspecified ankle and joints of unspecified foot: Secondary | ICD-10-CM | POA: Diagnosis not present

## 2022-08-01 ENCOUNTER — Telehealth: Payer: Self-pay | Admitting: Pharmacist

## 2022-08-01 NOTE — Progress Notes (Deleted)
Care Coordination Pharmacy Assistant   Patient ID: [name], female   DOB: 06-27-1956, 67 y.o.   MRN: [id number]  Reason for Encounter: Disease State   Conditions to be addressed/monitored: HTN  Recent office visits:  07/17/22 Margaret Peng, NP - Patient presented for Essential hypertension and other concerns. No medication changes.   06/24/22 Patient presented to [facility] for Multiple Sclerosis. No other visit details available.   Recent consult visits:  None  Hospital visits:  Medication Reconciliation was completed by comparing discharge summary, patient's EMR and Pharmacy list, and upon discussion with patient.  Patient presented to [facility] ED on 07/22/22 due to Flu Like Symptoms. Patient was present for 39 min.   New?Medications Started at [facility] Discharge:?? -started  ondansetron 4 MG disintegrating tablet oseltamivir 75 MG capsule  Medication Changes at Hospital Discharge: -Changed  none  Medications Discontinued at Hospital Discharge: -Stopped none  Medications that remain the same after Hospital Discharge:??  -All other medications will remain the same.    Medication Reconciliation was completed by comparing discharge summary, patient's EMR and Pharmacy list, and upon discussion with patient.   Patient presented to [facility] on 04/09/22 due to Botox Injection with cytoscopy. Patient was present for 3 hours.   New?Medications Started at [facility] Discharge:?? -started  none   Medication Changes at Hospital Discharge: -Changed  none   Medications Discontinued at Hospital Discharge: -Stopped  none   Medications that remain the same after Hospital Discharge:??  -All other medications will remain the same.     Medications: Outpatient Encounter Medications as of 08/01/2022  Medication Sig Note   amLODipine (NORVASC) 2.5 MG tablet TAKE ONE TABLET BY MOUTH DAILY    antiseptic oral  rinse (BIOTENE) LIQD 15 mLs by Mouth Rinse route as needed for dry mouth.    Ascorbic Acid 500 MG CAPS Take 500 mg by mouth daily.    Biotin 10000 MCG TABS Take by mouth daily at 6 (six) AM. 1000 mg daily    Calcium Citrate-Vitamin D (CALCIUM CITRATE+D3 PO) Take 600 mg by mouth daily.    cephALEXin (KEFLEX) 250 MG capsule Take 250 mg by mouth daily.    Cholecalciferol (VITAMIN D-3) 5000 UNITS TABS Take 1 tablet by mouth daily. 2000 units daily    CRANBERRY PO Take 1 capsule by mouth in the morning and at bedtime. 500 mg    Cyanocobalamin (VITAMIN B 12) 500 MCG TABS Take 1,000 mcg by mouth daily.    D-MANNOSE PO Take 1 capsule by mouth in the morning and at bedtime.    DULoxetine (CYMBALTA) 60 MG capsule Take 1 capsule by mouth 2 (two) times daily.    esomeprazole (NEXIUM) 20 MG capsule Take 20 mg by mouth daily. 04/03/2015: She takes otc version   fluticasone (FLONASE) 50 MCG/ACT nasal spray Place 1 spray into both nostrils daily.    gabapentin (NEURONTIN) 300 MG capsule Take 300 mg by mouth at bedtime.    HYDROcodone-acetaminophen (NORCO/VICODIN) 5-325 MG tablet Take 1 tablet by mouth every 4 (four) hours as needed.    ibuprofen (ADVIL) 200 MG tablet Take 600 mg by mouth every 6 (six) hours as needed. 3 x weekly takes bid    losartan (COZAAR) 25 MG tablet Take 1 tablet (25 mg total) by mouth daily. For high BP instead of valsartan    Meth-Hyo-M Bl-Na Phos-Ph Sal (URIBEL) 118 MG CAPS Take 1 capsule by mouth daily as needed (bladder spasms). Using  PRN    natalizumab (TYSABRI) 300 MG/15ML injection Inject into the vein. Every  6 weeks    ondansetron (ZOFRAN-ODT) 4 MG disintegrating tablet Take 1 tablet (4 mg total) by mouth every 8 (eight) hours as needed for nausea.    oseltamivir (TAMIFLU) 75 MG capsule Take 1 capsule (75 mg total) by mouth every 12 (twelve) hours.    Polyethyl Glycol-Propyl Glycol 0.4-0.3 % SOLN Apply to eye. Systane dry eye drops prn    predniSONE (DELTASONE) 10 MG tablet Take  by mouth.    Probiotic Product (PROBIOTIC PEARLS) CAPS Take 1 capsule by mouth in the morning and at bedtime.    White Petrolatum-Mineral Oil (SYSTANE NIGHTTIME) OINT Apply to eye at bedtime.    No facility-administered encounter medications on file as of 08/01/2022.   Reviewed chart prior to disease state call. Spoke with patient regarding BP  Recent Office Vitals: BP Readings from Last 3 Encounters:  07/22/22 106/65  07/17/22 130/70  05/13/22 (!) 147/89   Pulse Readings from Last 3 Encounters:  07/22/22 90  07/17/22 85  05/13/22 75    Wt Readings from Last 3 Encounters:  07/22/22 161 lb 15.9 oz (73.5 kg)  07/17/22 162 lb (73.5 kg)  05/13/22 158 lb 9.6 oz (71.9 kg)     Kidney Function Lab Results  Component Value Date/Time   CREATININE 0.62 07/22/2022 06:49 PM   CREATININE 0.60 07/17/2022 08:54 AM   GFR 92.35 07/17/2022 08:54 AM   GFRNONAA >60 07/22/2022 06:49 PM   GFRAA >60 04/03/2015 10:35 AM       Latest Ref Rng & Units 07/22/2022    6:49 PM 07/17/2022    8:54 AM 04/09/2022    8:10 AM  BMP  Glucose 70 - 99 mg/dL 141  87  83   BUN 8 - 23 mg/dL '10  15  13   '$ Creatinine 0.44 - 1.00 mg/dL 0.62  0.60  0.60   Sodium 135 - 145 mmol/L 135  141  143   Potassium 3.5 - 5.1 mmol/L 3.5  4.0  4.3   Chloride 98 - 111 mmol/L 102  102  102   CO2 22 - 32 mmol/L 20  31    Calcium 8.9 - 10.3 mg/dL 9.2  9.4      Current antihypertensive regimen:  Amlodipine 2.5 mg 1 tablet daily  Losartan 25 mg once daily How often are you checking your Blood Pressure? {CHL HP BP Monitoring Frequency:[id number]} Current home BP readings: *** What recent interventions/DTPs have been made by any provider to improve Blood Pressure control since last CPP Visit: *** Any recent hospitalizations or ED visits since last visit with CPP? {yes/no:20286} What diet changes have been made to improve Blood Pressure Control?  *** What exercise is being done to improve your Blood Pressure Control?   ***  Adherence Review: Is the patient currently on ACE/ARB medication? {yes/no:20286} Does the patient have >5 day gap between last estimated fill dates? {yes/no:20286}     Care Gaps: Zoster Vaccine - Overdue COVID Booster - Overdue AWV- 08/12/21 BP- 106/65 07/22/22  Star Rating Drugs: Losartan 25 mg - Last filled 05/13/22 90 at [facility] Pharmacist Assistant [phone]

## 2022-08-05 NOTE — Progress Notes (Signed)
A user error has taken place: encounter opened in error, closed for administrative reasons.

## 2022-08-06 DIAGNOSIS — M546 Pain in thoracic spine: Secondary | ICD-10-CM | POA: Diagnosis not present

## 2022-08-06 DIAGNOSIS — M25579 Pain in unspecified ankle and joints of unspecified foot: Secondary | ICD-10-CM | POA: Diagnosis not present

## 2022-08-06 DIAGNOSIS — G35 Multiple sclerosis: Secondary | ICD-10-CM | POA: Diagnosis not present

## 2022-08-06 DIAGNOSIS — Z79899 Other long term (current) drug therapy: Secondary | ICD-10-CM | POA: Diagnosis not present

## 2022-08-11 DIAGNOSIS — H5212 Myopia, left eye: Secondary | ICD-10-CM | POA: Diagnosis not present

## 2022-08-11 DIAGNOSIS — H5201 Hypermetropia, right eye: Secondary | ICD-10-CM | POA: Diagnosis not present

## 2022-08-11 DIAGNOSIS — Z79899 Other long term (current) drug therapy: Secondary | ICD-10-CM | POA: Diagnosis not present

## 2022-08-11 DIAGNOSIS — H04123 Dry eye syndrome of bilateral lacrimal glands: Secondary | ICD-10-CM | POA: Diagnosis not present

## 2022-08-13 DIAGNOSIS — M25579 Pain in unspecified ankle and joints of unspecified foot: Secondary | ICD-10-CM | POA: Diagnosis not present

## 2022-08-17 ENCOUNTER — Other Ambulatory Visit: Payer: Self-pay | Admitting: Internal Medicine

## 2022-08-18 ENCOUNTER — Ambulatory Visit: Payer: Medicare Other

## 2022-08-20 DIAGNOSIS — R339 Retention of urine, unspecified: Secondary | ICD-10-CM | POA: Diagnosis not present

## 2022-08-20 DIAGNOSIS — M25579 Pain in unspecified ankle and joints of unspecified foot: Secondary | ICD-10-CM | POA: Diagnosis not present

## 2022-08-27 DIAGNOSIS — M25579 Pain in unspecified ankle and joints of unspecified foot: Secondary | ICD-10-CM | POA: Diagnosis not present

## 2022-08-29 ENCOUNTER — Telehealth: Payer: Self-pay

## 2022-08-29 ENCOUNTER — Ambulatory Visit: Payer: Medicare Other

## 2022-08-29 NOTE — Telephone Encounter (Signed)
Unsuccessful attempt to reach patient x2 on preferred number listed in notes for scheduled AWV. Left message on voicemail okay to reschedule

## 2022-09-03 DIAGNOSIS — M25579 Pain in unspecified ankle and joints of unspecified foot: Secondary | ICD-10-CM | POA: Diagnosis not present

## 2022-09-09 DIAGNOSIS — M545 Low back pain, unspecified: Secondary | ICD-10-CM | POA: Diagnosis not present

## 2022-09-09 DIAGNOSIS — G35 Multiple sclerosis: Secondary | ICD-10-CM | POA: Diagnosis not present

## 2022-09-09 DIAGNOSIS — M546 Pain in thoracic spine: Secondary | ICD-10-CM | POA: Diagnosis not present

## 2022-09-09 DIAGNOSIS — Z79899 Other long term (current) drug therapy: Secondary | ICD-10-CM | POA: Diagnosis not present

## 2022-09-10 DIAGNOSIS — M25579 Pain in unspecified ankle and joints of unspecified foot: Secondary | ICD-10-CM | POA: Diagnosis not present

## 2022-09-11 DIAGNOSIS — M810 Age-related osteoporosis without current pathological fracture: Secondary | ICD-10-CM | POA: Diagnosis not present

## 2022-09-15 DIAGNOSIS — R339 Retention of urine, unspecified: Secondary | ICD-10-CM | POA: Diagnosis not present

## 2022-09-15 DIAGNOSIS — N390 Urinary tract infection, site not specified: Secondary | ICD-10-CM | POA: Diagnosis not present

## 2022-09-17 ENCOUNTER — Other Ambulatory Visit: Payer: Self-pay | Admitting: Urology

## 2022-09-17 DIAGNOSIS — M25579 Pain in unspecified ankle and joints of unspecified foot: Secondary | ICD-10-CM | POA: Diagnosis not present

## 2022-09-18 ENCOUNTER — Telehealth (INDEPENDENT_AMBULATORY_CARE_PROVIDER_SITE_OTHER): Payer: Medicare Other | Admitting: Family Medicine

## 2022-09-18 DIAGNOSIS — Z0000 Encounter for general adult medical examination without abnormal findings: Secondary | ICD-10-CM

## 2022-09-18 NOTE — Patient Instructions (Addendum)
I really enjoyed getting to talk with you today! I am available on Tuesdays and Thursdays for virtual visits if you have any questions or concerns, or if I can be of any further assistance.   CHECKLIST FROM ANNUAL WELLNESS VISIT:  -Follow up (please call to schedule if not scheduled after visit):   -yearly for annual wellness visit with primary care office  Here is a list of your preventive care/health maintenance measures and the plan for each if any are due:  Health Maintenance  Topic Date Due   Zoster Vaccines- Shingrix (2 of 2) 07/08/2019   COVID-19 Vaccine (5 - 2023-24 season) 03/28/2022   Medicare Annual Wellness (AWV)  09/19/2023   MAMMOGRAM  05/13/2024   COLONOSCOPY (Pts 45-61yr Insurance coverage will need to be confirmed)  01/09/2025   DTaP/Tdap/Td (3 - Td or Tdap) 07/11/2025   Pneumonia Vaccine 67 Years old  Completed   INFLUENZA VACCINE  Completed   DEXA SCAN  Completed   HPV VACCINES  Aged Out   Hepatitis C Screening  Discontinued    -See a dentist at least yearly  -Get your eyes checked and then per your eye specialist's recommendations  -Other issues addressed today: -advise no more than one 5oz glass of wine per 24 hour period  -I have included below further information regarding a healthy whole foods based diet, physical activity guidelines for adults, stress management and opportunities for social connections. I hope you find this information useful.   -----------------------------------------------------------------------------------------------------------------------------------------------------------------------------------------------------------------------------------------------------------  NUTRITION: -eat real food: lots of colorful vegetables (half the plate) and fruits -5-7 servings of vegetables and fruits per day (fresh or steamed is best), exp. 2 servings of vegetables with lunch and dinner and 2 servings of fruit per day. Berries and greens  such as kale and collards are great choices.  -consume on a regular basis: whole grains (make sure first ingredient on label contains the word "whole"), fresh fruits, fish, nuts, seeds, healthy oils (such as olive oil, avocado oil, grape seed oil) -may eat small amounts of dairy and lean meat on occasion, but avoid processed meats such as ham, bacon, lunch meat, etc. -drink water -try to avoid fast food and pre-packaged foods, processed meat -most experts advise limiting sodium to < 23050mper day, should limit further is any chronic conditions such as high blood pressure, heart disease, diabetes, etc. The American Heart Association advised that < 150055ms is ideal -try to avoid foods that contain any ingredients with names you do not recognize  -try to avoid sugar/sweets (except for the natural sugar that occurs in fresh fruit) -try to avoid sweet drinks -try to avoid white rice, white bread, pasta (unless whole grain), white or yellow potatoes  EXERCISE GUIDELINES FOR ADULTS: -if you wish to increase your physical activity, do so gradually and with the approval of your doctor -STOP and seek medical care immediately if you have any chest pain, chest discomfort or trouble breathing when starting or increasing exercise  -move and stretch your body, legs, feet and arms when sitting for long periods -Physical activity guidelines for optimal health in adults: -least 150 minutes per week of aerobic exercise (can talk, but not sing) once approved by your doctor, 20-30 minutes of sustained activity or two 10 minute episodes of sustained activity every day.  -resistance training at least 2 days per week if approved by your doctor -balance exercises 3+ days per week:   Stand somewhere where you have something sturdy to hold onto if you  lose balance.    1) lift up on toes, start with 5x per day and work up to 20x   2) stand and lift on leg straight out to the side so that foot is a few inches of the  floor, start with 5x each side and work up to 20x each side   3) stand on one foot, start with 5 seconds each side and work up to 20 seconds on each side  If you need ideas or help with getting more active:  -Silver sneakers https://tools.silversneakers.com  -Walk with a Doc: http://stephens-thompson.biz/  -try to include resistance (weight lifting/strength building) and balance exercises twice per week: or the following link for ideas: ChessContest.fr  UpdateClothing.com.cy  STRESS MANAGEMENT: -can try meditating, or just sitting quietly with deep breathing while intentionally relaxing all parts of your body for 5 minutes daily -if you need further help with stress, anxiety or depression please follow up with your primary doctor or contact the wonderful folks at [place]: [place]: -options in [place] if you wish to engage in more social and exercise related activities:  -Silver sneakers https://tools.silversneakers.com  -Walk with a Doc: http://stephens-thompson.biz/  -Check out the [place] 50+ section on the [place] of [employer] (hiking clubs, book clubs, cards and games, chess, exercise classes, aquatic classes and much more) - see the website for details: https://www.[place].gov/departments/parks-recreation/active-adults50  -YouTube has lots of exercise videos for different ages and abilities as well  -[place] (a variety of indoor and outdoor inperson activities for adults). [phone]. [address]  -Virtual Online Classes (a variety of topics): see seniorplanet.org or call [phone]  -consider volunteering at a school, hospice center, church, senior center or elsewhere

## 2022-09-18 NOTE — Progress Notes (Signed)
PATIENT CHECK-IN and HEALTH RISK ASSESSMENT QUESTIONNAIRE:  -completed by phone/video for upcoming Medicare Preventive Visit  Pre-Visit Check-in: 1)Vitals (height, wt, BP, etc) - record in vitals section for visit on day of visit 2)Review and Update Medications, Allergies PMH, Surgeries, Social history in Epic 3)Hospitalizations in the last year with date/reason? No 4)Review and Update Care Team (patient's specialists) in Epic 5) Complete PHQ9 in Epic  6) Complete Fall Screening in Epic 7)Review all Health Maintenance Due and order under PCP if not done.  8)Medicare Wellness Questionnaire: Answer theses question about your habits: Do you drink alcohol? Yes   If yes, how many drinks do you have a day?2 glasses of wine with dinner nightly Have you ever smoked?No   Quit date if applicable? N/A  How many packs a day do/did you smoke? No Do you use smokeless tobacco?No Do you use an illicit drugs? Do you exercises? Patient has physical therapy once per week - and is exercising on her own on other  Are you sexually active? No Number of partners?  0 Typical breakfast: eggs,oatmeal,muffin Typical lunch:sandwich Typical dinner: protein, vegetable Typical snacks:no snacks  Beverages: water, seltzer  Answer theses question about you: Can you perform most household chores? Yes Do you find it hard to follow a conversation in a noisy room?No Do you often ask people to speak up or repeat themselves?No  Do you feel that you have a problem with memory?No Do you balance your checkbook and or bank acounts?Yes Do you feel safe at home?Yes Last dentist visit?Jan 2024 Do you need assistance with any of the following: Please note if so   Driving?-No  Feeding yourself?-No  Getting from bed to chair?-No  Getting to the toilet?-No  Bathing or showering?-No  Dressing yourself?-No  Managing money?-No  Climbing a flight of stairs-Yes  Preparing meals?-No  Do you have Advanced Directives in place  (Living Will, Healthcare Power or Attorney)? Yes   Last eye Exam and location?Jan 2024, [facility]   Do you currently use prescribed or non-prescribed narcotic or opioid pain medications? Take 6 Advil daily  Do you have a history or close family history of breast, ovarian, tubal or peritoneal cancer or a family member with BRCA (breast cancer susceptibility 1 and 2) gene mutations?  Nurse/Assistant Credentials/time stamp:  St    ----------------------------------------------------------------------------------------------------------------------------------------------------------------------------------------------------------------------   MEDICARE ANNUAL PREVENTIVE VISIT WITH PROVIDER: (Welcome to [employer], initial annual wellness or annual wellness exam)  Virtual Visit via Video Note  I connected with Airika Giller Rack on 09/18/22 by a video enabled telemedicine application and verified that I am speaking with the correct person using two identifiers.  Location patient: home Location provider:work or home office Persons participating in the virtual visit: patient, provider  Concerns and/or follow up today: reports has been doing good and bp has been great.   See HM section in Epic for other details of completed HM.    ROS: negative for report of fevers, unintentional weight loss, vision changes, vision loss, hearing loss or change, chest pain, sob, hemoptysis, melena, hematochezia, hematuria, genital discharge or lesions, falls, bleeding or bruising, loc, thoughts of suicide or self harm, memory loss  Patient-completed extensive health risk assessment - reviewed and discussed with the patient: See Health Risk Assessment completed with patient prior to the visit either above or in recent phone note. This was reviewed in detailed with the patient today and appropriate recommendations, orders and referrals were placed as needed per Summary below and patient  instructions.  Review of Medical History: -PMH, [place], Family History and current specialty and care providers reviewed and updated and listed below   Patient Care Team: [name], [name], MD as PCP - General Konrad Penta, MD as Referring Physician (Obstetrics and Gynecology) Ceasar Mons, MD as Consulting Physician (Urology) Sydnee Levans, MD as Consulting Physician (Dermatology) Karl Luke, MD as Referring Physician (Neurology) Viona Gilmore, [facility] (Inactive) as Pharmacist (Pharmacist)   Past Medical History:  Diagnosis Date   Ambulates with cane 04/08/2021   prn occ walker prn   Arthritis    left knee   Concussion 2019   no residual   Diverticulosis    GERD (gastroesophageal reflux disease)    Hypertension    MS (multiple sclerosis) ([place]) 2007   Neurogenic bladder    Self-catheterizes urinary bladder 04/08/2021   self caths 3 to 4 x daily   Sjogren's syndrome ([place]) 10/23/2021   runs vaporizer in room @ night to help with   Torn ligament 04/01/2022   left knee   Uses walker 04/08/2021   prn   Wears glasses for reading     Past Surgical History:  Procedure Laterality Date   ABDOMINAL HYSTERECTOMY  2008   partial   BLADDER SURGERY  2008   Tack   BOTOX INJECTION N/A 04/10/2021   Procedure: BOTOX INJECTION WITH CYSTOSCOPY;  Surgeon: Ceasar Mons, MD;  Location: [facility];  Service: Urology;  Laterality: N/A;   BOTOX INJECTION N/A 10/25/2021   Procedure: CYSTOSCOPY WITH BOTOX INJECTION 300 UNITS;  Surgeon: Ceasar Mons, MD;  [facility];  Service: Urology;  Laterality: N/A;   BOTOX INJECTION N/A 04/09/2022   Procedure: BOTOX INJECTION 300 units WITH CYSTOSCOPY;  Surgeon: Ceasar Mons, MD;  Location: [facility];  Service: Urology;  Laterality: N/A;   CATARACT EXTRACTION Left 2022   SHOULDER ARTHROSCOPY WITH ROTATOR CUFF REPAIR AND SUBACROMIAL  DECOMPRESSION  06/01/2012   Procedure: SHOULDER ARTHROSCOPY WITH ROTATOR CUFF REPAIR AND SUBACROMIAL DECOMPRESSION;  Surgeon: Magnus Sinning, MD;  Location: [place];  Service: Orthopedics;  Laterality: Left;  LEFT SHOULDER ARTHROSCOPY WITH SUBACROMIAL DECOMPRESION AND DEBRIDEMENT OF PARTIAL ROTATOR CUFF TEAR   TONSILLECTOMY AND ADENOIDECTOMY  1962    Social History   Socioeconomic History   Marital status: Married    Spouse name: Not on file   Number of children: 4   Years of education: Not on file   Highest education level: Not on file  Occupational History   Occupation: Disability  Tobacco Use   Smoking status: Never    Passive exposure: Never   Smokeless tobacco: Never  Vaping Use   Vaping Use: Never used  Substance and Sexual Activity   Alcohol use: Yes    Comment: 2 glasses wine per day   Drug use: No   Sexual activity: Not Currently  Other Topics Concern   Not on file  Social History Narrative   Unable to volunteer    Now on disability   Children now out of home   Daily caffeine    Social Determinants of Health   Financial Resource Strain: Low Risk  (08/12/2021)   Overall Financial Resource Strain (CARDIA)    Difficulty of Paying Living Expenses: Not hard at all  Food Insecurity: No Food Insecurity (08/12/2021)   Hunger Vital Sign    Worried About Running Out of Food in the Last Year: Never true    Ran Out of Food  in the Last Year: Never true  Transportation Needs: No Transportation Needs (08/12/2021)   PRAPARE - Hydrologist (Medical): No    Lack of Transportation (Non-Medical): No  Physical Activity: Insufficiently Active (08/12/2021)   Exercise Vital Sign    Days of Exercise per Week: 3 days    Minutes of Exercise per Session: 20 min  Stress: No Stress Concern Present (08/12/2021)   [place]    Feeling of Stress : Not at all  Social  Connections: [place] (08/20/2020)   Social Connection and Isolation Panel [NHANES]    Frequency of Communication with Friends and Family: More than three times a week    Frequency of Social Gatherings with Friends and Family: More than three times a week    Attends Religious Services: More than 4 times per year    Active Member of [employer] or Organizations: Yes    Attends Music therapist: More than 4 times per year    Marital Status: Married  Human resources officer Violence: Not At Risk (08/20/2020)   Humiliation, Afraid, Rape, and Kick questionnaire    Fear of Current or Ex-Partner: No    Emotionally Abused: No    Physically Abused: No    Sexually Abused: No    Family History  Problem Relation Age of Onset   Lung cancer Father    Colon cancer Neg Hx     Current Outpatient Medications on File Prior to Visit  Medication Sig Dispense Refill   amLODipine (NORVASC) 2.5 MG tablet TAKE ONE TABLET BY MOUTH DAILY 90 tablet 3   antiseptic oral rinse (BIOTENE) LIQD 15 mLs by Mouth Rinse route as needed for dry mouth.     Ascorbic Acid 500 MG CAPS Take 500 mg by mouth daily.     Biotin 10000 MCG TABS Take by mouth daily at 6 (six) AM. 1000 mg daily     Calcium Citrate-Vitamin D (CALCIUM CITRATE+D3 PO) Take 600 mg by mouth daily.     cephALEXin (KEFLEX) 250 MG capsule Take 250 mg by mouth daily.     Cholecalciferol (VITAMIN D-3) 5000 UNITS TABS Take 1 tablet by mouth daily. 2000 units daily     CRANBERRY PO Take 1 capsule by mouth in the morning and at bedtime. 500 mg     Cyanocobalamin (VITAMIN B 12) 500 MCG TABS Take 1,000 mcg by mouth daily. 30 tablet 11   D-MANNOSE PO Take 1 capsule by mouth in the morning and at bedtime.     DULoxetine (CYMBALTA) 60 MG capsule Take 1 capsule by mouth 2 (two) times daily.     esomeprazole (NEXIUM) 20 MG capsule Take 20 mg by mouth daily.     fluticasone (FLONASE) 50 MCG/ACT nasal spray Place 1 spray into both nostrils daily.     gabapentin  (NEURONTIN) 300 MG capsule Take 300 mg by mouth at bedtime.     HYDROcodone-acetaminophen (NORCO/VICODIN) 5-325 MG tablet Take 1 tablet by mouth every 4 (four) hours as needed.     ibuprofen (ADVIL) 200 MG tablet Take 600 mg by mouth every 6 (six) hours as needed. 3 x weekly takes bid     losartan (COZAAR) 25 MG tablet TAKE 1 TABLET BY MOUTH DAILY FOR HIGH BLOOD PRESSURE INSTEAD OF VALSARTAN 90 tablet 1   Meth-Hyo-M Bl-Na Phos-Ph Sal (URIBEL) 118 MG CAPS Take 1 capsule by mouth daily as needed (bladder spasms). Using PRN  natalizumab (TYSABRI) 300 MG/15ML injection Inject into the vein. Every  6 weeks     ondansetron (ZOFRAN-ODT) 4 MG disintegrating tablet Take 1 tablet (4 mg total) by mouth every 8 (eight) hours as needed for nausea. 10 tablet 0   oseltamivir (TAMIFLU) 75 MG capsule Take 1 capsule (75 mg total) by mouth every 12 (twelve) hours. 10 capsule 0   Polyethyl Glycol-Propyl Glycol 0.4-0.3 % SOLN Apply to eye. Systane dry eye drops prn     predniSONE (DELTASONE) 10 MG tablet Take by mouth.     Probiotic Product (PROBIOTIC PEARLS) CAPS Take 1 capsule by mouth in the morning and at bedtime.     White Petrolatum-Mineral Oil (SYSTANE NIGHTTIME) OINT Apply to eye at bedtime.     No current facility-administered medications on file prior to visit.    Allergies  Allergen Reactions   Meclizine Hcl     REACTION: Psychotic reaction   Tramadol Itching   Omeprazole Magnesium Hives   Prilosec [Omeprazole Magnesium] Hives   Tramadol Hcl Itching       Physical Exam There were no vitals filed for this visit. Estimated body mass index is 26.97 kg/m as calculated from the following:   Height as of 05/13/22: 5' 4.98" (1.65 m).   Weight as of 07/22/22: 161 lb 15.9 oz (73.5 kg).  EKG (optional): deferred due to virtual visit  GENERAL: alert, oriented, no acute distress detected, full vision exam deferred due to pandemic and/or virtual encounter  HEENT: atraumatic, conjunttiva clear, no  obvious abnormalities on inspection of external nose and ears  NECK: normal movements of the head and neck  LUNGS: on inspection no signs of respiratory distress, breathing rate appears normal, no obvious gross SOB, gasping or wheezing  CV: no obvious cyanosis  MS: moves all visible extremities without noticeable abnormality  PSYCH/NEURO: pleasant and cooperative, no obvious depression or anxiety, speech and thought processing grossly intact, Cognitive function grossly intact  Flowsheet Row Video Visit from 09/18/2022 in [facility] [place]  PHQ-9 Total Score 2           09/18/2022    4:40 PM 05/13/2022   12:19 PM 12/04/2021    3:04 PM 08/12/2021    4:04 PM 08/20/2020    2:56 PM  Depression screen PHQ 2/9  Decreased Interest 0 0 0 0 0  Down, Depressed, Hopeless 0 0 0 0 0  PHQ - 2 Score 0 0 0 0 0  Altered sleeping 0 0 0    Tired, decreased energy 2 1 0    Change in appetite 0 0 0    Feeling bad or failure about yourself  0 0 0    Trouble concentrating 0 1 0    Moving slowly or fidgety/restless 0 1 0    Suicidal thoughts 0 0 0    PHQ-9 Score 2 3 0    Difficult doing work/chores Not difficult at all Somewhat difficult Not difficult at all         10/25/2021   10:50 AM 12/04/2021    3:04 PM 05/13/2022   12:19 PM 07/22/2022    6:20 PM 09/18/2022    4:38 PM  Fall Risk  Falls in the past year?  1 1  1  $ Number of falls in past year - Comments     compression fracture of spine  Was there an injury with Fall?  0 0  1  Was there an injury with Fall? - Comments  compression fracture of her spine  Fall Risk Category Calculator  2 1  3  $ Fall Risk Category (Retired)  Moderate Low    (RETIRED) Patient Fall Risk Level Moderate fall risk Moderate fall risk Low fall risk Moderate fall risk   Patient at Risk for Falls Due to  Impaired mobility Impaired balance/gait    Fall risk Follow up  Falls evaluation completed Falls evaluation completed       SUMMARY  AND PLAN:  Encounter for Medicare annual wellness exam   Discussed applicable health maintenance/preventive health measures and advised and referred or ordered per patient preferences:  Health Maintenance  Topic Date Due   Zoster Vaccines- Shingrix (2 of 2) 07/08/2019 - she thinks she may have had this, plans to call to check and let us know   COVID-19 Vaccine (5 - 2023-24 season) 10/04/2022 (Originally 03/28/2022) - declined, discussed and plans to consider   Medicare Annual Wellness (AWV)  09/19/2023   MAMMOGRAM  05/13/2024   COLONOSCOPY (Pts 45-9yr Insurance coverage will need to be confirmed)  01/09/2025   DTaP/Tdap/Td (3 - Td or Tdap) 07/11/2025   Pneumonia Vaccine 67 Years old  Completed   INFLUENZA VACCINE  Completed   DEXA SCAN  Completed   HPV VACCINES  Aged Out   Hepatitis C Screening  Discontinued   HAd dexa in 2023. Dealing with healing comp fx. Seeing osteoporosis clinic. Reports also seeing PT. Using cane now and extra caution to prevent falls.  Education and counseling on the following was provided based on the above review of health and a plan/checklist for the patient, along with additional information discussed, was provided for the patient in the patient instructions :   -Provided counseling and plan for increased risk of falling if applicable per above screening. She is seeing PT, using can. Discussed balance exercises and how to do safely and encourage asking PT to help her develop plan for continued exercise between PT sessions and once finished with PT.  -Advised and counseled on maintaining healthy weight and healthy lifestyle - including the importance of a healthy diet, regular physical activity, social connections and stress management. -Advised and counseled on a whole foods based healthy diet and regular exercise: discussed a heart healthy whole foods based diet at length. A summary of a healthy diet was provided in the Patient Instructions.-Advised yearly  dental visits at minimum and regular eye exams -Advised and counseled on alcohol u[place]and advised no more than one 5oz glass of wine per 24 hour period.   Follow up: see patient instructions     Patient Instructions  I really enjoyed getting to talk with you today! I am available on Tuesdays and Thursdays for virtual visits if you have any questions or concerns, or if I can be of any further assistance.   CHECKLIST FROM ANNUAL WELLNESS VISIT:  -Follow up (please call to schedule if not scheduled after visit):   -yearly for annual wellness visit with primary care office  Here is a list of your preventive care/health maintenance measures and the plan for each if any are due:  Health Maintenance  Topic Date Due   Zoster Vaccines- Shingrix (2 of 2) 07/08/2019   COVID-19 Vaccine (5 - 2023-24 season) 03/28/2022   Medicare Annual Wellness (AWV)  09/19/2023   MAMMOGRAM  05/13/2024   COLONOSCOPY (Pts 45-487yrInsurance coverage will need to be confirmed)  01/09/2025   DTaP/Tdap/Td (3 - Td or Tdap) 07/11/2025   Pneumonia Vaccine [age]Years old  Completed   INFLUENZA VACCINE  Completed   DEXA SCAN  Completed   HPV VACCINES  Aged Out   Hepatitis C Screening  Discontinued    -See a dentist at least yearly  -Get your eyes checked and then per your eye specialist's recommendations  -Other issues addressed today: -advise no more than one 5oz glass of wine per 24 hour period  -I have included below further information regarding a healthy whole foods based diet, physical activity guidelines for adults, stress management and opportunities for social connections. I hope you find this information useful.   -----------------------------------------------------------------------------------------------------------------------------------------------------------------------------------------------------------------------------------------------------------  NUTRITION: -eat real food: lots of  colorful vegetables (half the plate) and fruits -5-7 servings of vegetables and fruits per day (fresh or steamed is best), exp. 2 servings of vegetables with lunch and dinner and 2 servings of fruit per day. Berries and greens such as kale and collards are great choices.  -consume on a regular basis: whole grains (make sure first ingredient on label contains the word "whole"), fresh fruits, fish, nuts, seeds, healthy oils (such as olive oil, avocado oil, grape seed oil) -may eat small amounts of dairy and lean meat on occasion, but avoid processed meats such as ham, bacon, lunch meat, etc. -drink water -try to avoid fast food and pre-packaged foods, processed meat -most experts advise limiting sodium to < 2314m per day, should limit further is any chronic conditions such as high blood pressure, heart disease, diabetes, etc. The American Heart Association advised that < 15061mis is ideal -try to avoid foods that contain any ingredients with names you do not recognize  -try to avoid sugar/sweets (except for the natural sugar that occurs in fresh fruit) -try to avoid sweet drinks -try to avoid white rice, white bread, pasta (unless whole grain), white or yellow potatoes  EXERCISE GUIDELINES FOR ADULTS: -if you wish to increase your physical activity, do so gradually and with the approval of your doctor -STOP and seek medical care immediately if you have any chest pain, chest discomfort or trouble breathing when starting or increasing exercise  -move and stretch your body, legs, feet and arms when sitting for long periods -Physical activity guidelines for optimal health in adults: -least 150 minutes per week of aerobic exercise (can talk, but not sing) once approved by your doctor, 20-30 minutes of sustained activity or two 10 minute episodes of sustained activity every day.  -resistance training at least 2 days per week if approved by your doctor -balance exercises 3+ days per week:   Stand  somewhere where you have something sturdy to hold onto if you lose balance.    1) lift up on toes, start with 5x per day and work up to 20x   2) stand and lift on leg straight out to the side so that foot is a few inches of the floor, start with 5x each side and work up to 20x each side   3) stand on one foot, start with 5 seconds each side and work up to 20 seconds on each side  If you need ideas or help with getting more active:  -Silver sneakers https://tools.silversneakers.com  -Walk with a Doc: Hthttp://stephens-thompson.biz/-try to include resistance (weight lifting/strength building) and balance exercises twice per week: or the following link for ideas: htChessContest.frhtUpdateClothing.com.cySTRESS MANAGEMENT: -can try meditating, or just sitting quietly with deep breathing while intentionally relaxing all parts of your body for 5 minutes daily -if you need further help with stress,  anxiety or depression please follow up with your primary doctor or contact the wonderful folks at [facility]: [phone]  SOCIAL CONNECTIONS: -options in [place] if you wish to engage in more social and exercise related activities:  -Silver sneakers https://tools.silversneakers.com  -Walk with a Doc: http://stephens-thompson.biz/  -Check out the [place] 50+ section on the [employer] (hiking clubs, book clubs, cards and games, chess, exercise classes, aquatic classes and much more) - see the website for details: https://www.[place].gov/departments/parks-recreation/active-adults50  -YouTube has lots of exercise videos for different ages and abilities as well  -[employer] (a variety of indoor and outdoor inperson activities for adults). [phone]. [address]  -Virtual Online Classes (a variety of topics): see seniorplanet.org or  call [phone]  -consider volunteering at a school, hospice center, church, senior center or elsewhere           Lucretia Kern, DO

## 2022-09-22 DIAGNOSIS — H04123 Dry eye syndrome of bilateral lacrimal glands: Secondary | ICD-10-CM | POA: Diagnosis not present

## 2022-09-24 DIAGNOSIS — M25579 Pain in unspecified ankle and joints of unspecified foot: Secondary | ICD-10-CM | POA: Diagnosis not present

## 2022-09-26 ENCOUNTER — Telehealth: Payer: Self-pay

## 2022-09-26 NOTE — Progress Notes (Signed)
Patient ID: [name], female   DOB: 09-29-1955, 67 y.o.   MRN: [id number]  Care Management & Coordination Services Pharmacy Team  Reason for Encounter: Appointment Reminder  Contacted patient to confirm telephone appointment with Theo Dills, PharmD on 09/29/22 at 4. Unsuccessful outreach. Left voicemail for patient to return call.     Star Rating Drugs:  Losartan 25 mg - Last filled 08/10/22 [facility]: Zoster Vaccine - Overdue COVID Booster - [facility] Clinical Pharmacist Assistant [phone]

## 2022-09-28 NOTE — Progress Notes (Unsigned)
Care Management & Coordination Services Pharmacy Note  09/28/2022 Name:  [name] MRN:  [id number] DOB:  [birth year]  Summary: BP at goal <130/80 Osteoporosis tx currently OTC Ca + Vit D, has appt with osteoporosis clinic this month ([facility] in [place])  Recommendations/Changes made from today's visit: -Check BP once weekly and if having symptoms of hypo/hypertension -Continue Calcium + Vit D, walking when tolerated -Notify office if Osteoporosis Clinic starts any new medications  Follow up plan: Pharmacist visit in 6 months   Subjective: [name] is an 67 y.o. year old female who is a primary patient of [name], [name], MD.  The care coordination team was consulted for assistance with disease management and care coordination needs.    Engaged with patient by telephone for follow up visit.  Recent office visits: 09/18/22 Colin Benton, DO - For AWV, no medication changes 07/17/22 [name], NP - For HTN, no med changes 05/13/22 Shanon Ace, MD - For HTN, Start Losaran '25mg'$  daily  Recent consult visits: 09/11/22 Jacinta Shoe -  For Well Women Exam, no med changes 09/09/22 Britt Boozer, MD (Neuro) - For MS, no med changes  Hospital visits: 07/22/22 - [facility] - For flu, prescribed Tamiflu, Zofran   Objective:  Lab Results  Component Value Date   CREATININE 0.62 07/22/2022   BUN 10 07/22/2022   GFR 92.35 07/17/2022   GFRNONAA >60 07/22/2022   GFRAA >60 04/03/2015   NA 135 07/22/2022   K 3.5 07/22/2022   CALCIUM 9.2 07/22/2022   CO2 20 (L) 07/22/2022   GLUCOSE 141 (H) 07/22/2022    Lab Results  Component Value Date/Time   HGBA1C 5.2 02/13/2014 10:41 AM   GFR 92.35 07/17/2022 08:54 AM   GFR 94.52 08/12/2021 02:10 PM    Last diabetic Eye exam: No results found for: "HMDIABEYEEXA"  Last diabetic Foot exam: No results found for: "HMDIABFOOTEX"   Lab Results  Component Value Date   CHOL 172 04/24/2021   HDL 55.80  04/24/2021   LDLCALC 94 04/24/2021   LDLDIRECT 110.0 04/23/2015   TRIG 112.0 04/24/2021   CHOLHDL 3 04/24/2021       Latest Ref Rng & Units 09/24/2021   12:00 AM 08/12/2021    2:18 PM 08/12/2021    2:10 PM  Hepatic Function  Total Protein 6.0 - 8.5 g/dL  6.4  6.9   Albumin 3.5 - 5.0 4.4      4.4   AST 13 - 35 17      18   ALT 7 - 35 20      20   Alk Phosphatase 25 - 125 87      84   Total Bilirubin 0.2 - 1.2 mg/dL   0.6      This result is from an external source.    Lab Results  Component Value Date/Time   TSH 1.12 04/24/2021 11:51 AM   TSH 1.01 04/27/2017 01:57 PM   FREET4 0.76 04/23/2015 03:10 PM   FREET4 0.74 02/13/2014 10:41 AM       Latest Ref Rng & Units 07/22/2022    6:49 PM 04/09/2022    8:10 AM 10/25/2021   11:19 AM  CBC  WBC 4.0 - 10.5 K/uL 8.9     Hemoglobin 12.0 - 15.0 g/dL 13.4  11.6  12.9   Hematocrit 36.0 - 46.0 % 38.2  34.0  38.0   Platelets 150 - 400 K/uL 208  Lab Results  Component Value Date/Time   VD25OH 82.38 04/24/2021 11:51 AM   VD25OH 69.39 06/01/2018 02:25 PM    Clinical ASCVD: No  The 10-year ASCVD risk score (Arnett DK, et al., 2019) is: 8.2%   Values used to calculate the score:     Age: 67 years     Sex: Female     Is Non-Hispanic African American: No     Diabetic: No     Tobacco smoker: No     Systolic Blood Pressure: 123456 mmHg     Is BP treated: Yes     HDL Cholesterol: 55.8 mg/dL     Total Cholesterol: 172 mg/dL    DEXA: 11/26/21 T score: -3.1 (osteoporosis)     09/18/2022    4:40 PM 05/13/2022   12:19 PM 12/04/2021    3:04 PM  Depression screen PHQ 2/9  Decreased Interest 0 0 0  Down, Depressed, Hopeless 0 0 0  PHQ - 2 Score 0 0 0  Altered sleeping 0 0 0  Tired, decreased energy 2 1 0  Change in appetite 0 0 0  Feeling bad or failure about yourself  0 0 0  Trouble concentrating 0 1 0  Moving slowly or fidgety/restless 0 1 0  Suicidal thoughts 0 0 0  PHQ-9 Score 2 3 0  Difficult doing work/chores Not  difficult at all Somewhat difficult Not difficult at all     Social History   Tobacco Use  Smoking Status Never   Passive exposure: Never  Smokeless Tobacco Never   BP Readings from Last 3 Encounters:  07/22/22 106/65  07/17/22 130/70  05/13/22 (!) 147/89   Pulse Readings from Last 3 Encounters:  07/22/22 90  07/17/22 85  05/13/22 75   Wt Readings from Last 3 Encounters:  07/22/22 161 lb 15.9 oz (73.5 kg)  07/17/22 162 lb (73.5 kg)  05/13/22 158 lb 9.6 oz (71.9 kg)   BMI Readings from Last 3 Encounters:  07/22/22 26.97 kg/m  07/17/22 26.97 kg/m  05/13/22 26.41 kg/m    Allergies  Allergen Reactions   Meclizine Hcl     REACTION: Psychotic reaction   Tramadol Itching   Omeprazole Magnesium Hives   Prilosec [Omeprazole Magnesium] Hives   Tramadol Hcl Itching    Medications Reviewed Today     Reviewed by [name], NP (Nurse Practitioner) on 07/17/22 at [phone]  Med List Status: <None>   Medication Order Taking? Sig Documenting Provider Last Dose Status Informant  amLODipine (NORVASC) 2.5 MG tablet [id number] Yes TAKE ONE TABLET BY MOUTH DAILY [name], [name], MD Taking Active   antiseptic oral rinse Charlene Brooke) LIQD [id number] Yes 15 mLs by Mouth Rinse route as needed for dry mouth. [provider] Taking Active Self  Ascorbic Acid 500 MG CAPS [id number] Yes Take 500 mg by mouth daily. [provider] Taking Active Self  Biotin 10000 MCG TABS [id number] Yes Take by mouth daily at 6 (six) AM. 1000 mg daily [provider] Taking Active Self  Calcium Citrate-Vitamin D (CALCIUM CITRATE+D3 PO) [id number] Yes Take 600 mg by mouth daily. [provider] Taking Active   cephALEXin (KEFLEX) 250 MG capsule [id number] Yes Take 250 mg by mouth daily. [provider] Taking Active   Cholecalciferol (VITAMIN D-3) 5000 UNITS TABS [id number] Yes Take 1 tablet by mouth daily. 2000 units daily [provider] Taking Active Self   CRANBERRY PO [id number] Yes Take 1 capsule by mouth in the morning and at bedtime. [place]  mg [provider] Taking Active Self  Cyanocobalamin (VITAMIN B 12) 500 MCG TABS [id number] Yes Take 1,000 mcg by mouth daily. Ceasar Mons, MD Taking Active Self  D-MANNOSE PO [id number] Yes Take 1 capsule by mouth in the morning and at bedtime. [provider] Taking Active Self  DULoxetine (CYMBALTA) 60 MG capsule [id number] Yes Take 1 capsule by mouth 2 (two) times daily. [provider] Taking Active Self  esomeprazole (NEXIUM) 20 MG capsule [id number] Yes Take 20 mg by mouth daily. [provider] Taking Active Self           Med Note Caryn Section, Marylyn Ishihara A   Tue Apr 03, 2015 11:25 AM) She takes otc version  fluticasone (FLONASE) 50 MCG/ACT nasal spray [id number] Yes Place 1 spray into both nostrils daily. [provider] Taking Active Self  gabapentin (NEURONTIN) 300 MG capsule [id number] Yes Take 300 mg by mouth at bedtime. [provider] Taking Active Self  HYDROcodone-acetaminophen (NORCO/VICODIN) 5-325 MG tablet [id number] Yes Take 1 tablet by mouth every 4 (four) hours as needed. [provider] Taking Active   ibuprofen (ADVIL) 200 MG tablet [id number] Yes Take 600 mg by mouth every 6 (six) hours as needed. 3 x weekly takes bid [provider] Taking Active Self  losartan (COZAAR) 25 MG tablet [id number] Yes Take 1 tablet (25 mg total) by mouth daily. For high BP instead of valsartan [name], [name], MD Taking Active   Meth-Hyo-M Bl-Na Phos-Ph Sal (URIBEL) 118 MG CAPS [id number] Yes Take 1 capsule by mouth daily as needed (bladder spasms). Using PRN [provider] Taking Active Self  natalizumab (TYSABRI) 300 MG/15ML injection [id number] Yes Inject into the vein. Every  6 weeks [provider] Taking Active Self           Med Note Danny Lawless, SHARON P   Tue Apr 01, 2022 11:15 AM)    Polyethyl Glycol-Propyl Glycol  0.4-0.3 % SOLN [id number] Yes Apply to eye. Systane dry eye drops prn [provider] Taking Active Self  predniSONE (DELTASONE) 10 MG tablet [id number] Yes Take by mouth. [provider] Taking Active   Probiotic Product (PROBIOTIC PEARLS) CAPS [id number] Yes Take 1 capsule by mouth in the morning and at bedtime. [provider] Taking Active Self  White Petrolatum-Mineral Oil (SYSTANE NIGHTTIME) OINT [id number] Yes Apply to eye at bedtime. [provider] Taking Active             SDOH:  (Social Determinants of Health) assessments and interventions performed: Yes SDOH Interventions    Flowsheet Row Chronic Care Management from 01/30/2021 in [place] at [facility] from 08/20/2020 in [place] at [facility] Interventions -- Intervention Not Indicated  Housing Interventions -- Intervention Not Indicated  Transportation Interventions Intervention Not Indicated Intervention Not Indicated  Financial Strain Interventions Intervention Not Indicated Intervention Not Indicated  Physical Activity Interventions -- Intervention Not Indicated  Stress Interventions -- Intervention Not Indicated  Social Connections Interventions -- Intervention Not Indicated       Medication Assistance: None required.  Patient affirms current coverage meets needs. Fund for Fritzi Mandes has run out though, patient has been given other assistance options but has not followed up, will be sending me a Pharmacist, community message with info so I can assist.  Medication Access: Within the past 30 days, how often has patient missed a dose of medication? None Is a pillbox or other method used  to improve adherence? Yes  Factors that may affect medication adherence? financial need Are meds synced by current pharmacy? No  Are meds delivered by current pharmacy? No  Does patient experience delays in picking up  medications due to transportation concerns? No   [facility] Services Reviewed: Is patient disadvantaged to use [facility]?: No  Current Rx insurance plan: [facility] Name and location of Current pharmacy:  [name] [id number], [place] [address] Phone: [phone] Fax: [phone]  [facility] [place], [address] [place] [address] Phone: [phone] Fax: [phone]  [facility] services reviewed with patient today?: No  Patient requests to transfer care to [facility] Pharmacy?: No  Reason patient declined to change pharmacies: Not mentioned at this visit  Compliance/Adherence/Medication fill history: Care Gaps: Vaccines: Shingles, COVID  Star-Rating Drugs: Losartan '25mg'$  PDC 100%   Assessment/Plan   Hypertension (BP goal <130/80) -Controlled -Current treatment: Amlodipine 2.'5mg'$  1 qd Appropriate, Effective, Safe, Accessible Losartan '25mg'$  1 qd Appropriate, Effective, Safe, Accessible -Medications previously tried: None  -Current home readings: not checking at home -Current dietary habits: Mindful of salt intake -Current exercise habits: not discussed -Denies hypotensive/hypertensive symptoms -Educated on BP goals and benefits of medications for prevention of heart attack, stroke and kidney damage; Daily salt intake goal < 2300 mg; Exercise goal of 150 minutes per week; Importance of home blood pressure monitoring; -Counseled to monitor BP at home once weekly, document, and provide log at future appointments -Recommended to continue current medication  Osteoporosis / Osteopenia (Goal Prevent bone fracture) -Uncontrolled -Last DEXA Scan: 11/26/21   T-Score lumbar spine: -3.1 -Patient is a candidate for pharmacologic treatment due to T-Score < -2.5 in femoral neck -Current treatment  Calcium +  Vit D Appropriate, Effective, Safe, Accessible -Medications previously tried: Reclast  -Recommend [phone] units of vitamin D daily. Recommend 1200 mg of calcium daily from dietary and supplemental sources. - Patient has been referred to [facility] Osteoporosis clinic for care, has an appt scheduled for later this month  [place] Pharmacist [phone]

## 2022-09-29 ENCOUNTER — Ambulatory Visit: Payer: Medicare Other

## 2022-10-01 DIAGNOSIS — M25579 Pain in unspecified ankle and joints of unspecified foot: Secondary | ICD-10-CM | POA: Diagnosis not present

## 2022-10-06 NOTE — Progress Notes (Signed)
Chief Complaint  Patient presents with   Follow-up    Pt is following up on x-ray from December. She wants to know if she should have MRI to follow up on it.     HPI: [name] 67 y.o. come in for  fu back issues  saw Cn in December and noted to have a compression fx of l1  w 50 % height loss and advised mri ct if acute poss. Had slipped in bathroom on back in [place]  pain and dec mobility but no weakness increase obvious.  Had x ray done end of 2023   Underlying MS and neuro bladder  no obv change in status   Bladder  only lasting botox 4 months   instead of 6 months  Never got called about  MRI Is on  appt plan for osteoporosis eval.  Dooing better  robaxin tid has helped pain and also baclofen.  New  info: daughter in 66s  has sx cw progressive ms under eval [place] ROS: See pertinent positives and negatives per HPI.  Past Medical History:  Diagnosis Date   Ambulates with cane 04/08/2021   prn occ walker prn   Arthritis    left knee   Concussion 2019   no residual   Diverticulosis    GERD (gastroesophageal reflux disease)    Hypertension    MS (multiple sclerosis) (HCC) 2007   Neurogenic bladder    Self-catheterizes urinary bladder 04/08/2021   self caths 3 to 4 x daily   Sjogren's syndrome (HCC) 10/23/2021   runs vaporizer in room @ night to help with   Torn ligament 04/01/2022   left knee   Uses walker 04/08/2021   prn   Wears glasses for reading     Family History  Problem Relation Age of Onset   Lung cancer Father    Colon cancer Neg Hx     Social History   Socioeconomic History   Marital status: Married    Spouse name: Not on file   Number of children: 4   Years of education: Not on file   Highest education level: Not on file  Occupational History   Occupation: Disability  Tobacco Use   Smoking status: Never    Passive exposure: Never   Smokeless tobacco: Never  Vaping Use   Vaping Use: Never used  Substance and Sexual Activity    Alcohol use: Yes    Comment: 2 glasses wine per day   Drug use: No   Sexual activity: Not Currently  Other Topics Concern   Not on file  Social History Narrative   Unable to volunteer    Now on disability   Children now out of home   Daily caffeine    Social Determinants of Health   Financial Resource Strain: Low Risk  (08/12/2021)   Overall Financial Resource Strain (CARDIA)    Difficulty of Paying Living Expenses: Not hard at all  Food Insecurity: No Food Insecurity (09/29/2022)   Hunger Vital Sign    Worried About Running Out of Food in the Last Year: Never true    Ran Out of Food in the Last Year: Never true  Transportation Needs: No Transportation Needs (09/29/2022)   PRAPARE - Administrator, Civil Service (Medical): No    Lack of Transportation (Non-Medical): No  Physical Activity: Insufficiently Active (08/12/2021)   Exercise Vital Sign    Days of Exercise per Week: 3 days    Minutes of  Exercise per Session: 20 min  Stress: No Stress Concern Present (08/12/2021)   [employer] of Occupational Health - Occupational Stress Questionnaire    Feeling of Stress : Not at all  Social Connections: Socially Integrated (08/20/2020)   Social Connection and Isolation Panel [NHANES]    Frequency of Communication with Friends and Family: More than three times a week    Frequency of Social Gatherings with Friends and Family: More than three times a week    Attends Religious Services: More than 4 times per year    Active Member of [employer] or Organizations: Yes    Attends Engineer, structural: More than 4 times per year    Marital Status: Married    Outpatient Medications Prior to Visit  Medication Sig Dispense Refill   amLODipine (NORVASC) 2.5 MG tablet TAKE ONE TABLET BY MOUTH DAILY 90 tablet 3   antiseptic oral rinse (BIOTENE) LIQD 15 mLs by Mouth Rinse route as needed for dry mouth.     Ascorbic Acid 500 MG CAPS Take 500 mg by mouth daily.     baclofen  (LIORESAL) 10 MG tablet Take by mouth 3 (three) times daily.     Biotin 16109 MCG TABS Take by mouth daily at 6 (six) AM. 1000 mg daily     Calcium Citrate-Vitamin D (CALCIUM CITRATE+D3 PO) Take 600 mg by mouth daily.     cephALEXin (KEFLEX) 250 MG capsule Take 250 mg by mouth daily.     Cholecalciferol (VITAMIN D-3) 5000 UNITS TABS Take 1 tablet by mouth daily. 2000 units daily     CRANBERRY PO Take 1 capsule by mouth in the morning and at bedtime. 500 mg     Cyanocobalamin (VITAMIN B 12) 500 MCG TABS Take 1,000 mcg by mouth daily. 30 tablet 11   D-MANNOSE PO Take 1 capsule by mouth in the morning and at bedtime.     DULoxetine (CYMBALTA) 60 MG capsule Take 1 capsule by mouth 2 (two) times daily.     esomeprazole (NEXIUM) 20 MG capsule Take 20 mg by mouth daily.     fluticasone (FLONASE) 50 MCG/ACT nasal spray Place 1 spray into both nostrils daily.     gabapentin (NEURONTIN) 300 MG capsule Take 300 mg by mouth at bedtime.     ibuprofen (ADVIL) 200 MG tablet Take 600 mg by mouth every 6 (six) hours as needed. 3 x weekly takes bid     losartan (COZAAR) 25 MG tablet TAKE 1 TABLET BY MOUTH DAILY FOR HIGH BLOOD PRESSURE INSTEAD OF VALSARTAN 90 tablet 1   Meth-Hyo-M Bl-Na Phos-Ph Sal (URIBEL) 118 MG CAPS Take 1 capsule by mouth daily as needed (bladder spasms). Using PRN     METHOCARBAMOL PO Take 750 mg by mouth 3 (three) times daily.     natalizumab (TYSABRI) 300 MG/15ML injection Inject into the vein. Every  6 weeks     ondansetron (ZOFRAN-ODT) 4 MG disintegrating tablet Take 1 tablet (4 mg total) by mouth every 8 (eight) hours as needed for nausea. 10 tablet 0   Probiotic Product (PROBIOTIC PEARLS) CAPS Take 1 capsule by mouth in the morning and at bedtime.     White Petrolatum-Mineral Oil (SYSTANE NIGHTTIME) OINT Apply to eye at bedtime.     HYDROcodone-acetaminophen (NORCO/VICODIN) 5-325 MG tablet Take 1 tablet by mouth every 4 (four) hours as needed.     oseltamivir (TAMIFLU) 75 MG capsule  Take 1 capsule (75 mg total) by mouth every 12 (twelve) hours. 10 capsule  0   Polyethyl Glycol-Propyl Glycol 0.4-0.3 % SOLN Apply to eye. Systane dry eye drops prn     predniSONE (DELTASONE) 10 MG tablet Take by mouth.     No facility-administered medications prior to visit.     EXAM:  BP 134/80 (BP Location: Right Arm, Patient Position: Sitting, Cuff Size: Normal)   Pulse 84   Temp 98 F (36.7 C) (Oral)   Ht 5' 4.5" (1.638 m)   Wt 157 lb (71.2 kg)   SpO2 98%   BMI 26.53 kg/m   Body mass index is 26.53 kg/m.  GENERAL: vitals reviewed and listed above, alert, oriented, appears well hydrated and in no acute distress HEENT: atraumatic, conjunctiva  clear, no obvious abnormalities on inspection of external nose and ears NECK: no obvious masses on inspection palpation  No resp ditress  CV: HRRR, no clubbing cyanosis or  peripheral edema nl cap refill  MS: moves all extremities has walker roller  independent   are of back pain upper lumbar but no deformity but some infra scapular dsicomfortr on right  PSYCH: pleasant and cooperative, no obvious depression or anxiety Lab Results  Component Value Date   WBC 8.9 07/22/2022   HGB 13.4 07/22/2022   HCT 38.2 07/22/2022   PLT 208 07/22/2022   GLUCOSE 141 (H) 07/22/2022   CHOL 172 04/24/2021   TRIG 112.0 04/24/2021   HDL 55.80 04/24/2021   LDLDIRECT 110.0 04/23/2015   LDLCALC 94 04/24/2021   ALT 20 09/24/2021   AST 17 09/24/2021   NA 135 07/22/2022   K 3.5 07/22/2022   CL 102 07/22/2022   CREATININE 0.62 07/22/2022   BUN 10 07/22/2022   CO2 20 (L) 07/22/2022   TSH 1.12 04/24/2021   HGBA1C 5.2 02/13/2014   BP Readings from Last 3 Encounters:  10/07/22 134/80  07/22/22 106/65  07/17/22 130/70    ASSESSMENT AND PLAN:  Discussed the following assessment and plan:  Compression fracture of L1 vertebra, sequela - Plan: MR Lumbar Spine Wo Contrast  Multiple sclerosis (HCC) - Plan: MR Lumbar Spine Wo Contrast  Voiding  dysfunction - Plan: MR Lumbar Spine Wo Contrast  Hx of falling - Plan: MR Lumbar Spine Wo Contrast Record review   getting better fracture after a slip and fall   pain improving  but has underlying  ms sx le  will proceed with MRI lumbar sp ine to evaluate anatomy   and fu as appropriate  .   -Patient advised to return or notify health care team  if  new concerns arise.  Patient Instructions  Glad the pain is improving on meds. Keep appt  with osteoporosis evaluation.   Will order MRI  of lumbar as discussed .    Neta Mends. Kenner Lewan M.D.

## 2022-10-07 ENCOUNTER — Encounter: Payer: Self-pay | Admitting: Internal Medicine

## 2022-10-07 ENCOUNTER — Ambulatory Visit (INDEPENDENT_AMBULATORY_CARE_PROVIDER_SITE_OTHER): Payer: Medicare Other | Admitting: Internal Medicine

## 2022-10-07 VITALS — BP 134/80 | HR 84 | Temp 98.0°F | Ht 64.5 in | Wt 157.0 lb

## 2022-10-07 DIAGNOSIS — N398 Other specified disorders of urinary system: Secondary | ICD-10-CM

## 2022-10-07 DIAGNOSIS — S32010S Wedge compression fracture of first lumbar vertebra, sequela: Secondary | ICD-10-CM | POA: Diagnosis not present

## 2022-10-07 DIAGNOSIS — S32010D Wedge compression fracture of first lumbar vertebra, subsequent encounter for fracture with routine healing: Secondary | ICD-10-CM | POA: Diagnosis not present

## 2022-10-07 DIAGNOSIS — G35 Multiple sclerosis: Secondary | ICD-10-CM

## 2022-10-07 DIAGNOSIS — Z9181 History of falling: Secondary | ICD-10-CM | POA: Diagnosis not present

## 2022-10-07 NOTE — Patient Instructions (Addendum)
Glad the pain is improving on meds. Keep appt  with osteoporosis evaluation.   Will order MRI  of lumbar as discussed .

## 2022-10-08 DIAGNOSIS — M25579 Pain in unspecified ankle and joints of unspecified foot: Secondary | ICD-10-CM | POA: Diagnosis not present

## 2022-10-09 ENCOUNTER — Ambulatory Visit
Admission: RE | Admit: 2022-10-09 | Discharge: 2022-10-09 | Disposition: A | Discharge status: Home / Self Care | Payer: Medicare Other | Source: Ambulatory Visit | Attending: Internal Medicine | Admitting: Internal Medicine

## 2022-10-09 ENCOUNTER — Encounter (HOSPITAL_BASED_OUTPATIENT_CLINIC_OR_DEPARTMENT_OTHER): Payer: Self-pay | Admitting: Urology

## 2022-10-09 DIAGNOSIS — M545 Low back pain, unspecified: Secondary | ICD-10-CM | POA: Diagnosis not present

## 2022-10-09 DIAGNOSIS — N398 Other specified disorders of urinary system: Secondary | ICD-10-CM

## 2022-10-09 DIAGNOSIS — M47816 Spondylosis without myelopathy or radiculopathy, lumbar region: Secondary | ICD-10-CM | POA: Diagnosis not present

## 2022-10-09 DIAGNOSIS — G35 Multiple sclerosis: Secondary | ICD-10-CM

## 2022-10-09 DIAGNOSIS — S32010S Wedge compression fracture of first lumbar vertebra, sequela: Secondary | ICD-10-CM

## 2022-10-09 DIAGNOSIS — Z9181 History of falling: Secondary | ICD-10-CM

## 2022-10-09 NOTE — Progress Notes (Signed)
Spoke w/ via phone for pre-op interview--- [place]---- ISTAT and EKG              Lab results------ COVID test -----patient states asymptomatic no test needed Arrive at -------0700 NPO after MN NO Solid Food.  Clear liquids from MN until---0600 Med rec completed Medications to take morning of surgery -----Norvasc, Cymbalta and Nexium Diabetic medication ----- Patient instructed no nail polish to be worn day of surgery Patient instructed to bring photo id and insurance card day of surgery Patient aware to have Driver (ride ) / caregiver Husband Iona Beard    for 24 hours after surgery  Patient Special Instructions ----- Pre-Op special Istructions ----- Patient verbalized understanding of instructions that were given at this phone interview. Patient denies shortness of breath, chest pain, fever, cough at this phone interview.

## 2022-10-12 NOTE — Progress Notes (Signed)
Good news   no pathologic lesion   noted  The L1 compression fracture  noted as in the  plain x ray . No new advice

## 2022-10-13 DIAGNOSIS — G35 Multiple sclerosis: Secondary | ICD-10-CM | POA: Diagnosis not present

## 2022-10-13 DIAGNOSIS — M7918 Myalgia, other site: Secondary | ICD-10-CM | POA: Diagnosis not present

## 2022-10-14 DIAGNOSIS — M25579 Pain in unspecified ankle and joints of unspecified foot: Secondary | ICD-10-CM | POA: Diagnosis not present

## 2022-10-14 NOTE — Anesthesia Preprocedure Evaluation (Signed)
Anesthesia Evaluation  Patient identified by MRN, date of birth, ID band Patient awake    Reviewed: Allergy & Precautions, NPO status , Patient's Chart, lab work & pertinent test results  History of Anesthesia Complications Negative for: history of anesthetic complications  Airway Mallampati: II  TM Distance: >3 FB Neck ROM: Full    Dental  (+) Dental Advisory Given, Teeth Intact   Pulmonary neg pulmonary ROS   breath sounds clear to auscultation       Cardiovascular hypertension, Pt. on medications (-) angina  Rhythm:Regular Rate:Normal     Neuro/Psych    Depression    MS: walks with walker    GI/Hepatic Neg liver ROS,GERD  Medicated and Controlled,,  Endo/Other  Sjogren's  Renal/GU negative Renal ROS Bladder dysfunction (self-cath)      Musculoskeletal   Abdominal   Peds  Hematology negative hematology ROS (+)   Anesthesia Other Findings   Reproductive/Obstetrics                             Anesthesia Physical Anesthesia Plan  ASA: 3  Anesthesia Plan: MAC   Post-op Pain Management: Tylenol PO (pre-op)*   Induction:   PONV Risk Score and Plan: 2 and Ondansetron and Treatment may vary due to age or medical condition  Airway Management Planned: Natural Airway and Simple Face Mask  Additional Equipment: None  Intra-op Plan:   Post-operative Plan:   Informed Consent: I have reviewed the patients History and Physical, chart, labs and discussed the procedure including the risks, benefits and alternatives for the proposed anesthesia with the patient or authorized representative who has indicated his/her understanding and acceptance.     Dental advisory given  Plan Discussed with: CRNA and Surgeon  Anesthesia Plan Comments:         Anesthesia Quick Evaluation

## 2022-10-15 ENCOUNTER — Encounter (HOSPITAL_BASED_OUTPATIENT_CLINIC_OR_DEPARTMENT_OTHER)
Admission: RE | Disposition: A | Discharge status: Home / Self Care | Payer: Self-pay | Source: Ambulatory Visit | Attending: Urology

## 2022-10-15 ENCOUNTER — Other Ambulatory Visit: Payer: Self-pay

## 2022-10-15 ENCOUNTER — Ambulatory Visit (HOSPITAL_BASED_OUTPATIENT_CLINIC_OR_DEPARTMENT_OTHER): Payer: Medicare Other | Admitting: Anesthesiology

## 2022-10-15 ENCOUNTER — Ambulatory Visit (HOSPITAL_BASED_OUTPATIENT_CLINIC_OR_DEPARTMENT_OTHER)
Admission: RE | Admit: 2022-10-15 | Discharge: 2022-10-15 | Disposition: A | Discharge status: Home / Self Care | Payer: Medicare Other | Source: Ambulatory Visit | Attending: Urology | Admitting: Urology

## 2022-10-15 ENCOUNTER — Encounter (HOSPITAL_BASED_OUTPATIENT_CLINIC_OR_DEPARTMENT_OTHER): Payer: Self-pay | Admitting: Urology

## 2022-10-15 DIAGNOSIS — M3500 Sicca syndrome, unspecified: Secondary | ICD-10-CM

## 2022-10-15 DIAGNOSIS — I10 Essential (primary) hypertension: Secondary | ICD-10-CM

## 2022-10-15 DIAGNOSIS — K219 Gastro-esophageal reflux disease without esophagitis: Secondary | ICD-10-CM | POA: Insufficient documentation

## 2022-10-15 DIAGNOSIS — N319 Neuromuscular dysfunction of bladder, unspecified: Secondary | ICD-10-CM

## 2022-10-15 DIAGNOSIS — N3941 Urge incontinence: Secondary | ICD-10-CM

## 2022-10-15 DIAGNOSIS — G35 Multiple sclerosis: Secondary | ICD-10-CM | POA: Insufficient documentation

## 2022-10-15 HISTORY — PX: BOTOX INJECTION: SHX5754

## 2022-10-15 HISTORY — PX: CYSTOSCOPY WITH INJECTION: SHX1424

## 2022-10-15 LAB — POCT I-STAT, CHEM 8
BUN: 12 mg/dL (ref 8–23)
Calcium, Ion: 1.24 mmol/L (ref 1.15–1.40)
Chloride: 100 mmol/L (ref 98–111)
Creatinine, Ser: 0.6 mg/dL (ref 0.44–1.00)
Glucose, Bld: 84 mg/dL (ref 70–99)
HCT: 36 % (ref 36.0–46.0)
Hemoglobin: 12.2 g/dL (ref 12.0–15.0)
Potassium: 4.1 mmol/L (ref 3.5–5.1)
Sodium: 140 mmol/L (ref 135–145)
TCO2: 31 mmol/L (ref 22–32)

## 2022-10-15 SURGERY — CYSTOSCOPY, WITH INJECTION OF BLADDER NECK OR BLADDER WALL
Anesthesia: Monitor Anesthesia Care | Site: Bladder

## 2022-10-15 MED ORDER — SODIUM CHLORIDE (PF) 0.9 % IJ SOLN
INTRAMUSCULAR | Status: DC | PRN
  Administered 2022-10-15: 30 mL

## 2022-10-15 MED ORDER — OXYCODONE HCL 5 MG/5ML PO SOLN: 5.0000 mg | Freq: Once | ORAL | Status: DC | PRN

## 2022-10-15 MED ORDER — FENTANYL CITRATE (PF) 100 MCG/2ML IJ SOLN: 25.0000 ug | INTRAMUSCULAR | Status: DC | PRN

## 2022-10-15 MED ORDER — ACETAMINOPHEN 500 MG PO TABS
ORAL_TABLET | ORAL | Status: AC
  Filled 2022-10-15: qty 2

## 2022-10-15 MED ORDER — MIDAZOLAM HCL 2 MG/2ML IJ SOLN: 0.5000 mg | Freq: Once | INTRAMUSCULAR | Status: DC | PRN

## 2022-10-15 MED ORDER — LACTATED RINGERS IV SOLN: INTRAVENOUS | Status: DC

## 2022-10-15 MED ORDER — MIDAZOLAM HCL 5 MG/5ML IJ SOLN
INTRAMUSCULAR | Status: DC | PRN
  Administered 2022-10-15: 1 mg via INTRAVENOUS

## 2022-10-15 MED ORDER — ONABOTULINUMTOXINA 100 UNITS IJ SOLR
INTRAMUSCULAR | Status: DC | PRN
  Administered 2022-10-15: 300 [IU] via INTRAMUSCULAR

## 2022-10-15 MED ORDER — PROPOFOL 10 MG/ML IV BOLUS
INTRAVENOUS | Status: DC | PRN
  Administered 2022-10-15: 20 mg via INTRAVENOUS
  Administered 2022-10-15: 10 mg via INTRAVENOUS

## 2022-10-15 MED ORDER — PROPOFOL 500 MG/50ML IV EMUL
INTRAVENOUS | Status: AC
  Filled 2022-10-15: qty 50

## 2022-10-15 MED ORDER — ONDANSETRON HCL 4 MG/2ML IJ SOLN
INTRAMUSCULAR | Status: DC | PRN
  Administered 2022-10-15: 4 mg via INTRAVENOUS

## 2022-10-15 MED ORDER — SODIUM CHLORIDE 0.9 % IR SOLN
Status: DC | PRN
  Administered 2022-10-15: 3000 mL

## 2022-10-15 MED ORDER — FENTANYL CITRATE (PF) 100 MCG/2ML IJ SOLN
INTRAMUSCULAR | Status: DC | PRN
  Administered 2022-10-15: 25 ug via INTRAVENOUS
  Administered 2022-10-15: 50 ug via INTRAVENOUS

## 2022-10-15 MED ORDER — ONDANSETRON HCL 4 MG/2ML IJ SOLN
INTRAMUSCULAR | Status: AC
  Filled 2022-10-15: qty 2

## 2022-10-15 MED ORDER — MIDAZOLAM HCL 2 MG/2ML IJ SOLN
INTRAMUSCULAR | Status: AC
  Filled 2022-10-15: qty 2

## 2022-10-15 MED ORDER — OXYCODONE HCL 5 MG PO TABS: 5.0000 mg | ORAL_TABLET | Freq: Once | ORAL | Status: DC | PRN

## 2022-10-15 MED ORDER — PROPOFOL 500 MG/50ML IV EMUL
INTRAVENOUS | Status: DC | PRN
  Administered 2022-10-15: 200 ug/kg/min via INTRAVENOUS

## 2022-10-15 MED ORDER — PHENYLEPHRINE 80 MCG/ML (10ML) SYRINGE FOR IV PUSH (FOR BLOOD PRESSURE SUPPORT)
PREFILLED_SYRINGE | INTRAVENOUS | Status: DC | PRN
  Administered 2022-10-15: 80 ug via INTRAVENOUS
  Administered 2022-10-15: 160 ug via INTRAVENOUS

## 2022-10-15 MED ORDER — CEFAZOLIN SODIUM-DEXTROSE 2-4 GM/100ML-% IV SOLN
2.0000 g | INTRAVENOUS | Status: AC
  Administered 2022-10-15: 2 g via INTRAVENOUS

## 2022-10-15 MED ORDER — MEPERIDINE HCL 25 MG/ML IJ SOLN: 6.2500 mg | INTRAMUSCULAR | Status: DC | PRN

## 2022-10-15 MED ORDER — CEFAZOLIN SODIUM-DEXTROSE 2-4 GM/100ML-% IV SOLN
INTRAVENOUS | Status: AC
  Filled 2022-10-15: qty 100

## 2022-10-15 MED ORDER — FENTANYL CITRATE (PF) 100 MCG/2ML IJ SOLN
INTRAMUSCULAR | Status: AC
  Filled 2022-10-15: qty 2

## 2022-10-15 MED ORDER — PHENYLEPHRINE 80 MCG/ML (10ML) SYRINGE FOR IV PUSH (FOR BLOOD PRESSURE SUPPORT)
PREFILLED_SYRINGE | INTRAVENOUS | Status: AC
  Filled 2022-10-15: qty 10

## 2022-10-15 MED ORDER — ACETAMINOPHEN 500 MG PO TABS
1000.0000 mg | ORAL_TABLET | Freq: Once | ORAL | Status: AC
  Administered 2022-10-15: 1000 mg via ORAL

## 2022-10-15 SURGICAL SUPPLY — 19 items
BAG DRAIN URO-CYSTO SKYTR STRL (DRAIN) ×1 IMPLANT
BAG DRN UROCATH (DRAIN) ×1
CATH ROBINSON RED A/P 14FR (CATHETERS) IMPLANT
CLOTH BEACON ORANGE TIMEOUT ST (SAFETY) ×1 IMPLANT
ELECT REM PT RETURN 9FT ADLT (ELECTROSURGICAL) ×1
ELECTRODE REM PT RTRN 9FT ADLT (ELECTROSURGICAL) ×1 IMPLANT
GLOVE BIO SURGEON STRL SZ7.5 (GLOVE) ×1 IMPLANT
GOWN STRL REUS W/TWL LRG LVL3 (GOWN DISPOSABLE) ×3 IMPLANT
KIT TURNOVER CYSTO (KITS) ×1 IMPLANT
MANIFOLD NEPTUNE II (INSTRUMENTS) ×1 IMPLANT
NDL ASPIRATION 22 (NEEDLE) IMPLANT
NDL SAFETY ECLIP 18X1.5 (MISCELLANEOUS) IMPLANT
NEEDLE ASPIRATION 22 (NEEDLE) IMPLANT
PACK CYSTO (CUSTOM PROCEDURE TRAY) ×1 IMPLANT
SLEEVE SCD COMPRESS KNEE MED (STOCKING) ×1 IMPLANT
SYR 20ML LL LF (SYRINGE) IMPLANT
SYR CONTROL 10ML LL (SYRINGE) IMPLANT
TUBE CONNECTING 12X1/4 (SUCTIONS) ×1 IMPLANT
WATER STERILE IRR 3000ML UROMA (IV SOLUTION) ×1 IMPLANT

## 2022-10-15 NOTE — Anesthesia Postprocedure Evaluation (Signed)
Anesthesia Post Note  Patient: [name]  Procedure(s) Performed: CYSTOSCOPY WITH INJECTION (Bladder) BOTOX  300 UNITSINJECTION (Bladder)     Patient location during evaluation: PACU Anesthesia Type: MAC Level of consciousness: awake and alert, patient cooperative and oriented Pain management: pain level controlled Vital Signs Assessment: post-procedure vital signs reviewed and stable Respiratory status: spontaneous breathing, nonlabored ventilation and respiratory function stable Cardiovascular status: stable and blood pressure returned to baseline Postop Assessment: no apparent nausea or vomiting Anesthetic complications: no   No notable events documented.  Last Vitals:  Vitals:   10/15/22 0746 10/15/22 0932  BP: (!) 146/81 (!) 88/47  Pulse: 77 69  Resp: 16 13  Temp: 36.6 C (!) 36.4 C  SpO2: 98% 95%    Last Pain:  Vitals:   10/15/22 0932  TempSrc:   PainSc: 0-No pain                 Rashauna Tep,E. Dariush Mcnellis

## 2022-10-15 NOTE — Anesthesia Procedure Notes (Signed)
Procedure Name: MAC Date/Time: 10/15/2022 9:05 AM  Performed by: Rogers Blocker, CRNAPre-anesthesia Checklist: Patient identified, Emergency Drugs available, Suction available and Patient being monitored Oxygen Delivery Method: Simple face mask Placement Confirmation: positive ETCO2

## 2022-10-15 NOTE — Transfer of Care (Signed)
Immediate Anesthesia Transfer of Care Note  Patient: [name]  Procedure(s) Performed: CYSTOSCOPY WITH INJECTION (Bladder) BOTOX  300 UNITSINJECTION (Bladder)  Patient Location: PACU  Anesthesia Type:MAC  Level of Consciousness: awake, alert , oriented, and patient cooperative  Airway & Oxygen Therapy: Patient Spontanous Breathing  Post-op Assessment: Report given to RN and Post -op Vital signs reviewed and stable  Post vital signs: Reviewed and stable  Last Vitals:  Vitals Value Taken Time  BP 88/47 10/15/22 0933  Temp    Pulse 70 10/15/22 0934  Resp 16 10/15/22 0934  SpO2 91 % 10/15/22 0934  Vitals shown include unvalidated device data.  Last Pain:  Vitals:   10/15/22 0746  TempSrc: Oral  PainSc: 2       Patients Stated Pain Goal: 5 ([id number])  Complications: No notable events documented.

## 2022-10-15 NOTE — Op Note (Signed)
Operative Note  Preoperative diagnosis:  1.  Refractory urge incontinence 2.  History of multiple sclerosis  Postoperative diagnosis: Same   Procedure(s): 1.  Cystoscopy with intravesical Botox injections (300 units)  Surgeon: Ellison Hughs, MD  Assistants:  None  Anesthesia:  General  Complications:  None  EBL: Less than 5 mL  Specimens: 1.  None  Drains/Catheters: 1.  None  Intraoperative findings:   No intravesical abnormalities  Indication:  [name] is a 67 y.o. female with a history of multiple sclerosis and refractory urge incontinence.  She has had multiple Botox injections in the past with significant improvement in her incontinence and is here today for repeat injection.  She has been consented for the above procedures, voices understanding and wishes to proceed.  Description of procedure:  After informed consent was obtained, the patient was brought to the operating room and general LMA anesthesia was administered. The patient was then placed in the dorsolithotomy position and prepped and draped in the usual sterile fashion. A timeout was performed. A 23 French rigid cystoscope was then inserted into the urethral meatus and advanced into the bladder under direct vision. A complete bladder survey revealed no intravesical pathology.   I then injected a total of 300 units of Botox diluted in 30 mL of saline in 0.5 mL aliquots throughout the detrusor musculature, and a grid like fashion.  There was no evidence of bleeding following the injections.  The patient's bladder was then partially drained.  She tolerated the procedure well and was transferred to the postanesthesia in stable condition.   Plan: Discharge home.  Resume clean intermittent catheterization as needed

## 2022-10-15 NOTE — H&P (Addendum)
Urology Preoperative H&P   Chief Complaint: Incontinence  History of Present Illness: [name] is a 67 y.o. female with a history multiple sclerosis, urge incontinence and recurrent urinary tract infections. She performs self-catheterization BID/TID when she feels suprapubic fullness/discomfort. s/p intravesical botox injections (300 units) with significant improvement in her incontinence.   Past Medical History:  Diagnosis Date   Ambulates with cane 04/08/2021   prn occ walker prn   Arthritis    left knee   Concussion 2019   no residual   Diverticulosis    GERD (gastroesophageal reflux disease)    Hypertension    MS (multiple sclerosis) ([place]) 2007   Neurogenic bladder    Self-catheterizes urinary bladder 04/08/2021   self caths 3 to 4 x daily   Sjogren's syndrome ([place]) 10/23/2021   runs vaporizer in room @ night to help with   Torn ligament 04/01/2022   left knee   Uses walker 04/08/2021   prn   Wears glasses for reading     Past Surgical History:  Procedure Laterality Date   ABDOMINAL HYSTERECTOMY  2008   partial   BLADDER SURGERY  2008   Tack   BOTOX INJECTION N/A 04/10/2021   Procedure: BOTOX INJECTION WITH CYSTOSCOPY;  Surgeon: Ceasar Mons, MD;  Location: [facility];  Service: Urology;  Laterality: N/A;   BOTOX INJECTION N/A 10/25/2021   Procedure: CYSTOSCOPY WITH BOTOX INJECTION 300 UNITS;  Surgeon: Ceasar Mons, MD;  Location: [facility];  Service: Urology;  Laterality: N/A;   BOTOX INJECTION N/A 04/09/2022   Procedure: BOTOX INJECTION 300 units WITH CYSTOSCOPY;  Surgeon: Ceasar Mons, MD;  Location: [facility];  Service: Urology;  Laterality: N/A;   CATARACT EXTRACTION Left 2022   SHOULDER ARTHROSCOPY WITH ROTATOR CUFF REPAIR AND SUBACROMIAL DECOMPRESSION  06/01/2012   Procedure: SHOULDER ARTHROSCOPY WITH ROTATOR CUFF REPAIR AND SUBACROMIAL DECOMPRESSION;  Surgeon:  Magnus Sinning, MD;  Location: [place];  Service: Orthopedics;  Laterality: Left;  LEFT SHOULDER ARTHROSCOPY WITH SUBACROMIAL DECOMPRESION AND DEBRIDEMENT OF PARTIAL ROTATOR CUFF TEAR   TONSILLECTOMY AND ADENOIDECTOMY  1962    Allergies:  Allergies  Allergen Reactions   Meclizine Hcl     REACTION: Psychotic reaction   Tramadol Itching   Omeprazole Magnesium Hives   Prilosec [Omeprazole Magnesium] Hives   Tramadol Hcl Itching    Family History  Problem Relation Age of Onset   Lung cancer Father    Colon cancer Neg Hx     Social History:  reports that she has never smoked. She has never been exposed to tobacco smoke. She has never used smokeless tobacco. She reports current alcohol use. She reports that she does not use drugs.  ROS: A complete review of systems was performed.  All systems are negative except for pertinent findings as noted.  Physical Exam:  Vital signs in last 24 hours: Temp:  [97.9 F (36.6 C)] 97.9 F (36.6 C) (03/20 0746) Pulse Rate:  [77] 77 (03/20 0746) Resp:  [16] 16 (03/20 0746) BP: (146)/(81) 146/81 (03/20 0746) SpO2:  [98 %] 98 % (03/20 0746) Weight:  [70.3 kg] 70.3 kg (03/20 0746) Constitutional:  Alert and oriented, No acute distress Cardiovascular: Regular rate and rhythm, No JVD Respiratory: Normal respiratory effort, Lungs clear bilaterally GI: Abdomen is soft, nontender, nondistended, no abdominal masses GU: No CVA tenderness Lymphatic: No lymphadenopathy Neurologic: Grossly intact, no focal deficits Psychiatric: Normal mood and affect  Laboratory Data:  Recent Labs    10/15/22 0737  HGB 12.2  HCT 36.0    Recent Labs    10/15/22 0737  NA 140  K 4.1  CL 100  GLUCOSE 84  BUN 12  CREATININE 0.60     Results for orders placed or performed during the hospital encounter of 10/15/22 (from the past 24 hour(s))  I-STAT, chem 8     Status: None   Collection Time: 10/15/22  7:37 AM  Result Value Ref Range    Sodium 140 135 - 145 mmol/L   Potassium 4.1 3.5 - 5.1 mmol/L   Chloride 100 98 - 111 mmol/L   BUN 12 8 - 23 mg/dL   Creatinine, Ser 0.60 0.44 - 1.00 mg/dL   Glucose, Bld 84 70 - 99 mg/dL   Calcium, Ion 1.24 1.15 - 1.40 mmol/L   TCO2 31 22 - 32 mmol/L   Hemoglobin 12.2 12.0 - 15.0 g/dL   HCT 36.0 36.0 - 46.0 %   No results found for this or any previous visit (from the past 240 hour(s)).  Renal Function: Recent Labs    10/15/22 0737  CREATININE 0.60   Estimated Creatinine Clearance: 66.2 mL/min (by C-G formula based on SCr of 0.6 mg/dL).  Radiologic Imaging: No results found.  I independently reviewed the above imaging studies.  Assessment and Plan [name] is a 67 y.o. female with refractory urge incontinence 2/2 multiple schlerosis   -The risks, benefits and alternatives to cystoscopy with intravesical botox injections was discussed.  She voices understanding and wishes to proceed.   Ellison Hughs, MD 10/15/2022, 8:53 AM  [facility] Pager: [phone]

## 2022-10-15 NOTE — Discharge Instructions (Signed)
  Post Anesthesia Home Care Instructions  Activity: Get plenty of rest for the remainder of the day. A responsible individual must stay with you for 24 hours following the procedure.  For the next 24 hours, DO NOT: -Drive a car -Paediatric nurse -Drink alcoholic beverages -Take any medication unless instructed by your physician -Make any legal decisions or sign important papers.  Meals: Start with liquid foods such as gelatin or soup. Progress to regular foods as tolerated. Avoid greasy, spicy, heavy foods. If nausea and/or vomiting occur, drink only clear liquids until the nausea and/or vomiting subsides. Call your physician if vomiting continues.  Special Instructions/Symptoms: Your throat may feel dry or sore from the anesthesia or the breathing tube placed in your throat during surgery. If this causes discomfort, gargle with warm salt water. The discomfort should disappear within 24 hours.  If you had a scopolamine patch placed behind your ear for the management of post- operative nausea and/or vomiting:  1. The medication in the patch is effective for 72 hours, after which it should be removed.  Wrap patch in a tissue and discard in the trash. Wash hands thoroughly with soap and water. 2. You may remove the patch earlier than 72 hours if you experience unpleasant side effects which may include dry mouth, dizziness or visual disturbances. 3. Avoid touching the patch. Wash your hands with soap and water after contact with the patch.    No acetaminophen/Tylenol until after 1:30 pm today if needed.

## 2022-10-16 ENCOUNTER — Encounter (HOSPITAL_BASED_OUTPATIENT_CLINIC_OR_DEPARTMENT_OTHER): Payer: Self-pay | Admitting: Urology

## 2022-10-16 DIAGNOSIS — M25579 Pain in unspecified ankle and joints of unspecified foot: Secondary | ICD-10-CM | POA: Diagnosis not present

## 2022-10-21 DIAGNOSIS — R339 Retention of urine, unspecified: Secondary | ICD-10-CM | POA: Diagnosis not present

## 2022-10-22 DIAGNOSIS — M25579 Pain in unspecified ankle and joints of unspecified foot: Secondary | ICD-10-CM | POA: Diagnosis not present

## 2022-10-24 DIAGNOSIS — M25579 Pain in unspecified ankle and joints of unspecified foot: Secondary | ICD-10-CM | POA: Diagnosis not present

## 2022-10-28 DIAGNOSIS — E559 Vitamin D deficiency, unspecified: Secondary | ICD-10-CM | POA: Diagnosis not present

## 2022-10-28 DIAGNOSIS — Z780 Asymptomatic menopausal state: Secondary | ICD-10-CM | POA: Diagnosis not present

## 2022-10-28 DIAGNOSIS — Z8262 Family history of osteoporosis: Secondary | ICD-10-CM | POA: Diagnosis not present

## 2022-10-28 DIAGNOSIS — Z8781 Personal history of (healed) traumatic fracture: Secondary | ICD-10-CM | POA: Diagnosis not present

## 2022-10-28 DIAGNOSIS — M810 Age-related osteoporosis without current pathological fracture: Secondary | ICD-10-CM | POA: Diagnosis not present

## 2022-10-28 DIAGNOSIS — G35 Multiple sclerosis: Secondary | ICD-10-CM | POA: Diagnosis not present

## 2022-10-28 DIAGNOSIS — Z79899 Other long term (current) drug therapy: Secondary | ICD-10-CM | POA: Diagnosis not present

## 2022-10-28 DIAGNOSIS — Z1321 Encounter for screening for nutritional disorder: Secondary | ICD-10-CM | POA: Diagnosis not present

## 2022-10-28 DIAGNOSIS — Z8489 Family history of other specified conditions: Secondary | ICD-10-CM | POA: Diagnosis not present

## 2022-10-29 DIAGNOSIS — M25579 Pain in unspecified ankle and joints of unspecified foot: Secondary | ICD-10-CM | POA: Diagnosis not present

## 2022-10-29 DIAGNOSIS — G35 Multiple sclerosis: Secondary | ICD-10-CM | POA: Diagnosis not present

## 2022-10-29 DIAGNOSIS — G319 Degenerative disease of nervous system, unspecified: Secondary | ICD-10-CM | POA: Diagnosis not present

## 2022-10-31 DIAGNOSIS — M25579 Pain in unspecified ankle and joints of unspecified foot: Secondary | ICD-10-CM | POA: Diagnosis not present

## 2022-11-05 ENCOUNTER — Encounter: Payer: Self-pay | Admitting: Family Medicine

## 2022-11-05 ENCOUNTER — Ambulatory Visit (INDEPENDENT_AMBULATORY_CARE_PROVIDER_SITE_OTHER): Payer: Medicare Other | Admitting: Family Medicine

## 2022-11-05 VITALS — BP 124/78 | HR 82 | Temp 98.4°F | Wt 158.8 lb

## 2022-11-05 DIAGNOSIS — M25579 Pain in unspecified ankle and joints of unspecified foot: Secondary | ICD-10-CM | POA: Diagnosis not present

## 2022-11-05 DIAGNOSIS — J40 Bronchitis, not specified as acute or chronic: Secondary | ICD-10-CM

## 2022-11-05 MED ORDER — AZITHROMYCIN 250 MG PO TABS
ORAL_TABLET | ORAL | 0 refills | Status: DC
Start: 2022-11-05 — End: 2023-04-20

## 2022-11-05 NOTE — Progress Notes (Signed)
   Subjective:    Patient ID: [name], female    DOB: August 18, 1955, 67 y.o.   MRN: [id number]  HPI Here for one week of chest congestion, coughing up yellow sputum, and wheezing. No fever or SOB. Using Robitussin.    Review of Systems  Constitutional: Negative.   HENT: Negative.    Eyes: Negative.   Respiratory:  Positive for cough and wheezing. Negative for shortness of breath.   Cardiovascular: Negative.        Objective:   Physical Exam Constitutional:      Appearance: Normal appearance. She is not ill-appearing.  HENT:     Right Ear: Tympanic membrane, ear canal and external ear normal.     Left Ear: Tympanic membrane, ear canal and external ear normal.     Nose: Nose normal.     Mouth/Throat:     Pharynx: Oropharynx is clear.  Eyes:     Conjunctiva/sclera: Conjunctivae normal.  Pulmonary:     Effort: Pulmonary effort is normal.     Breath sounds: Wheezing present. No rhonchi or rales.  Lymphadenopathy:     Cervical: No cervical adenopathy.  Neurological:     Mental Status: She is alert.           Assessment & Plan:  Bronchitis, treat with a Zpack.  Gershon Crane, MD

## 2022-11-07 DIAGNOSIS — M25579 Pain in unspecified ankle and joints of unspecified foot: Secondary | ICD-10-CM | POA: Diagnosis not present

## 2022-11-07 DIAGNOSIS — M810 Age-related osteoporosis without current pathological fracture: Secondary | ICD-10-CM | POA: Diagnosis not present

## 2022-11-12 DIAGNOSIS — M25579 Pain in unspecified ankle and joints of unspecified foot: Secondary | ICD-10-CM | POA: Diagnosis not present

## 2022-11-15 DIAGNOSIS — R339 Retention of urine, unspecified: Secondary | ICD-10-CM | POA: Diagnosis not present

## 2022-11-18 DIAGNOSIS — M79605 Pain in left leg: Secondary | ICD-10-CM | POA: Diagnosis not present

## 2022-11-18 DIAGNOSIS — G35 Multiple sclerosis: Secondary | ICD-10-CM | POA: Diagnosis not present

## 2022-11-18 DIAGNOSIS — M545 Low back pain, unspecified: Secondary | ICD-10-CM | POA: Diagnosis not present

## 2022-11-18 DIAGNOSIS — Z79899 Other long term (current) drug therapy: Secondary | ICD-10-CM | POA: Diagnosis not present

## 2022-11-18 DIAGNOSIS — G43909 Migraine, unspecified, not intractable, without status migrainosus: Secondary | ICD-10-CM | POA: Diagnosis not present

## 2022-11-19 DIAGNOSIS — M25579 Pain in unspecified ankle and joints of unspecified foot: Secondary | ICD-10-CM | POA: Diagnosis not present

## 2022-11-26 DIAGNOSIS — M25579 Pain in unspecified ankle and joints of unspecified foot: Secondary | ICD-10-CM | POA: Diagnosis not present

## 2022-11-28 DIAGNOSIS — M25579 Pain in unspecified ankle and joints of unspecified foot: Secondary | ICD-10-CM | POA: Diagnosis not present

## 2022-12-01 DIAGNOSIS — Z1231 Encounter for screening mammogram for malignant neoplasm of breast: Secondary | ICD-10-CM | POA: Diagnosis not present

## 2022-12-03 DIAGNOSIS — M25579 Pain in unspecified ankle and joints of unspecified foot: Secondary | ICD-10-CM | POA: Diagnosis not present

## 2022-12-10 DIAGNOSIS — M25579 Pain in unspecified ankle and joints of unspecified foot: Secondary | ICD-10-CM | POA: Diagnosis not present

## 2022-12-23 DIAGNOSIS — R339 Retention of urine, unspecified: Secondary | ICD-10-CM | POA: Diagnosis not present

## 2022-12-23 DIAGNOSIS — Z79899 Other long term (current) drug therapy: Secondary | ICD-10-CM | POA: Diagnosis not present

## 2022-12-23 DIAGNOSIS — G35 Multiple sclerosis: Secondary | ICD-10-CM | POA: Diagnosis not present

## 2022-12-24 DIAGNOSIS — M25579 Pain in unspecified ankle and joints of unspecified foot: Secondary | ICD-10-CM | POA: Diagnosis not present

## 2022-12-31 DIAGNOSIS — M25579 Pain in unspecified ankle and joints of unspecified foot: Secondary | ICD-10-CM | POA: Diagnosis not present

## 2023-01-02 DIAGNOSIS — M25579 Pain in unspecified ankle and joints of unspecified foot: Secondary | ICD-10-CM | POA: Diagnosis not present

## 2023-01-07 DIAGNOSIS — B078 Other viral warts: Secondary | ICD-10-CM | POA: Diagnosis not present

## 2023-01-07 DIAGNOSIS — Z85828 Personal history of other malignant neoplasm of skin: Secondary | ICD-10-CM | POA: Diagnosis not present

## 2023-01-07 DIAGNOSIS — B351 Tinea unguium: Secondary | ICD-10-CM | POA: Diagnosis not present

## 2023-01-07 DIAGNOSIS — M25579 Pain in unspecified ankle and joints of unspecified foot: Secondary | ICD-10-CM | POA: Diagnosis not present

## 2023-01-07 DIAGNOSIS — L821 Other seborrheic keratosis: Secondary | ICD-10-CM | POA: Diagnosis not present

## 2023-01-09 DIAGNOSIS — M25579 Pain in unspecified ankle and joints of unspecified foot: Secondary | ICD-10-CM | POA: Diagnosis not present

## 2023-01-14 DIAGNOSIS — M25579 Pain in unspecified ankle and joints of unspecified foot: Secondary | ICD-10-CM | POA: Diagnosis not present

## 2023-01-21 DIAGNOSIS — R339 Retention of urine, unspecified: Secondary | ICD-10-CM | POA: Diagnosis not present

## 2023-01-21 DIAGNOSIS — M25579 Pain in unspecified ankle and joints of unspecified foot: Secondary | ICD-10-CM | POA: Diagnosis not present

## 2023-01-27 DIAGNOSIS — M25579 Pain in unspecified ankle and joints of unspecified foot: Secondary | ICD-10-CM | POA: Diagnosis not present

## 2023-01-28 DIAGNOSIS — Z79899 Other long term (current) drug therapy: Secondary | ICD-10-CM | POA: Diagnosis not present

## 2023-01-28 DIAGNOSIS — G35 Multiple sclerosis: Secondary | ICD-10-CM | POA: Diagnosis not present

## 2023-02-04 DIAGNOSIS — M25579 Pain in unspecified ankle and joints of unspecified foot: Secondary | ICD-10-CM | POA: Diagnosis not present

## 2023-02-06 DIAGNOSIS — M25579 Pain in unspecified ankle and joints of unspecified foot: Secondary | ICD-10-CM | POA: Diagnosis not present

## 2023-02-10 DIAGNOSIS — M25579 Pain in unspecified ankle and joints of unspecified foot: Secondary | ICD-10-CM | POA: Diagnosis not present

## 2023-02-18 DIAGNOSIS — M25579 Pain in unspecified ankle and joints of unspecified foot: Secondary | ICD-10-CM | POA: Diagnosis not present

## 2023-02-20 DIAGNOSIS — M25579 Pain in unspecified ankle and joints of unspecified foot: Secondary | ICD-10-CM | POA: Diagnosis not present

## 2023-02-24 DIAGNOSIS — R339 Retention of urine, unspecified: Secondary | ICD-10-CM | POA: Diagnosis not present

## 2023-02-25 DIAGNOSIS — M25579 Pain in unspecified ankle and joints of unspecified foot: Secondary | ICD-10-CM | POA: Diagnosis not present

## 2023-03-03 DIAGNOSIS — G35 Multiple sclerosis: Secondary | ICD-10-CM | POA: Diagnosis not present

## 2023-03-03 DIAGNOSIS — Z79899 Other long term (current) drug therapy: Secondary | ICD-10-CM | POA: Diagnosis not present

## 2023-03-04 DIAGNOSIS — M25579 Pain in unspecified ankle and joints of unspecified foot: Secondary | ICD-10-CM | POA: Diagnosis not present

## 2023-03-11 DIAGNOSIS — M25579 Pain in unspecified ankle and joints of unspecified foot: Secondary | ICD-10-CM | POA: Diagnosis not present

## 2023-03-18 DIAGNOSIS — M25579 Pain in unspecified ankle and joints of unspecified foot: Secondary | ICD-10-CM | POA: Diagnosis not present

## 2023-03-20 DIAGNOSIS — R339 Retention of urine, unspecified: Secondary | ICD-10-CM | POA: Diagnosis not present

## 2023-03-25 DIAGNOSIS — M25579 Pain in unspecified ankle and joints of unspecified foot: Secondary | ICD-10-CM | POA: Diagnosis not present

## 2023-04-01 DIAGNOSIS — M25579 Pain in unspecified ankle and joints of unspecified foot: Secondary | ICD-10-CM | POA: Diagnosis not present

## 2023-04-02 DIAGNOSIS — G35 Multiple sclerosis: Secondary | ICD-10-CM | POA: Diagnosis not present

## 2023-04-03 ENCOUNTER — Other Ambulatory Visit: Payer: Self-pay | Admitting: Urology

## 2023-04-06 DIAGNOSIS — M25579 Pain in unspecified ankle and joints of unspecified foot: Secondary | ICD-10-CM | POA: Diagnosis not present

## 2023-04-08 DIAGNOSIS — M25579 Pain in unspecified ankle and joints of unspecified foot: Secondary | ICD-10-CM | POA: Diagnosis not present

## 2023-04-13 DIAGNOSIS — M25579 Pain in unspecified ankle and joints of unspecified foot: Secondary | ICD-10-CM | POA: Diagnosis not present

## 2023-04-15 DIAGNOSIS — G35 Multiple sclerosis: Secondary | ICD-10-CM | POA: Diagnosis not present

## 2023-04-17 ENCOUNTER — Encounter (HOSPITAL_BASED_OUTPATIENT_CLINIC_OR_DEPARTMENT_OTHER): Payer: Self-pay | Admitting: Urology

## 2023-04-20 ENCOUNTER — Encounter (HOSPITAL_BASED_OUTPATIENT_CLINIC_OR_DEPARTMENT_OTHER): Payer: Self-pay | Admitting: Urology

## 2023-04-20 NOTE — Progress Notes (Signed)
Spoke w/ via phone for pre-op interview--- pt Lab needs dos----   [employer] results------ no COVID test -----patient states asymptomatic no test needed Arrive at ------- 1030 on 04-24-2023 NPO after MN NO Solid Food.  Clear liquids from MN until--- 0930 Med rec completed Medications to take morning of surgery ----- norvasc, robaxin, nexium, flonase spray Diabetic medication ----- n/a Patient instructed no nail polish to be worn day of surgery Patient instructed to bring photo id and insurance card day of surgery Patient aware to have Driver (ride ) / caregiver    for 24 hours after surgery - husband, george Patient Special Instructions ----- n/a Pre-Op special Instructions ----- n/a Patient verbalized understanding of instructions that were given at this phone interview. Patient denies chest pain, sob, fever, cough at the interview.

## 2023-04-21 DIAGNOSIS — M25579 Pain in unspecified ankle and joints of unspecified foot: Secondary | ICD-10-CM | POA: Diagnosis not present

## 2023-04-22 DIAGNOSIS — M25579 Pain in unspecified ankle and joints of unspecified foot: Secondary | ICD-10-CM | POA: Diagnosis not present

## 2023-04-24 ENCOUNTER — Ambulatory Visit (HOSPITAL_BASED_OUTPATIENT_CLINIC_OR_DEPARTMENT_OTHER): Payer: Medicare Other | Admitting: Certified Registered"

## 2023-04-24 ENCOUNTER — Encounter (HOSPITAL_BASED_OUTPATIENT_CLINIC_OR_DEPARTMENT_OTHER)
Admission: RE | Disposition: A | Discharge status: Home / Self Care | Payer: Self-pay | Source: Home / Self Care | Attending: Urology

## 2023-04-24 ENCOUNTER — Ambulatory Visit (HOSPITAL_BASED_OUTPATIENT_CLINIC_OR_DEPARTMENT_OTHER)
Admission: RE | Admit: 2023-04-24 | Discharge: 2023-04-24 | Disposition: A | Discharge status: Home / Self Care | Payer: Medicare Other | Attending: Urology | Admitting: Urology

## 2023-04-24 ENCOUNTER — Other Ambulatory Visit: Payer: Self-pay

## 2023-04-24 ENCOUNTER — Encounter (HOSPITAL_BASED_OUTPATIENT_CLINIC_OR_DEPARTMENT_OTHER): Payer: Self-pay | Admitting: Urology

## 2023-04-24 DIAGNOSIS — K219 Gastro-esophageal reflux disease without esophagitis: Secondary | ICD-10-CM | POA: Diagnosis not present

## 2023-04-24 DIAGNOSIS — I10 Essential (primary) hypertension: Secondary | ICD-10-CM | POA: Diagnosis not present

## 2023-04-24 DIAGNOSIS — G35 Multiple sclerosis: Secondary | ICD-10-CM | POA: Insufficient documentation

## 2023-04-24 DIAGNOSIS — Z01818 Encounter for other preprocedural examination: Secondary | ICD-10-CM

## 2023-04-24 DIAGNOSIS — N3942 Incontinence without sensory awareness: Secondary | ICD-10-CM | POA: Insufficient documentation

## 2023-04-24 DIAGNOSIS — N3941 Urge incontinence: Secondary | ICD-10-CM

## 2023-04-24 DIAGNOSIS — Z87440 Personal history of urinary (tract) infections: Secondary | ICD-10-CM | POA: Insufficient documentation

## 2023-04-24 DIAGNOSIS — M3500 Sicca syndrome, unspecified: Secondary | ICD-10-CM | POA: Diagnosis not present

## 2023-04-24 DIAGNOSIS — N312 Flaccid neuropathic bladder, not elsewhere classified: Secondary | ICD-10-CM | POA: Diagnosis not present

## 2023-04-24 HISTORY — PX: CYSTOSCOPY WITH INJECTION: SHX1424

## 2023-04-24 HISTORY — DX: Diverticulosis of large intestine without perforation or abscess without bleeding: K57.30

## 2023-04-24 HISTORY — DX: Urge incontinence: N39.41

## 2023-04-24 HISTORY — DX: Asymptomatic varicose veins of bilateral lower extremities: I83.93

## 2023-04-24 LAB — POCT I-STAT, CHEM 8
BUN: 16 mg/dL (ref 8–23)
Calcium, Ion: 1.2 mmol/L (ref 1.15–1.40)
Chloride: 102 mmol/L (ref 98–111)
Creatinine, Ser: 0.6 mg/dL (ref 0.44–1.00)
Glucose, Bld: 96 mg/dL (ref 70–99)
HCT: 35 % — ABNORMAL LOW (ref 36.0–46.0)
Hemoglobin: 11.9 g/dL — ABNORMAL LOW (ref 12.0–15.0)
Potassium: 4.3 mmol/L (ref 3.5–5.1)
Sodium: 139 mmol/L (ref 135–145)
TCO2: 28 mmol/L (ref 22–32)

## 2023-04-24 SURGERY — CYSTOSCOPY, WITH INJECTION OF BLADDER NECK OR BLADDER WALL
Anesthesia: Monitor Anesthesia Care | Site: Bladder

## 2023-04-24 MED ORDER — PROPOFOL 10 MG/ML IV BOLUS
INTRAVENOUS | Status: AC
  Filled 2023-04-24: qty 20

## 2023-04-24 MED ORDER — LACTATED RINGERS IV SOLN: INTRAVENOUS | Status: DC

## 2023-04-24 MED ORDER — ONABOTULINUMTOXINA 100 UNITS IJ SOLR
INTRAMUSCULAR | Status: DC | PRN
  Administered 2023-04-24: 300 [IU] via INTRAMUSCULAR

## 2023-04-24 MED ORDER — SODIUM CHLORIDE (PF) 0.9 % IJ SOLN
INTRAMUSCULAR | Status: DC | PRN
  Administered 2023-04-24: 10 mL

## 2023-04-24 MED ORDER — FENTANYL CITRATE (PF) 100 MCG/2ML IJ SOLN
INTRAMUSCULAR | Status: DC | PRN
  Administered 2023-04-24: 50 ug via INTRAVENOUS

## 2023-04-24 MED ORDER — PROPOFOL 500 MG/50ML IV EMUL
INTRAVENOUS | Status: DC | PRN
  Administered 2023-04-24: 200 ug/kg/min via INTRAVENOUS

## 2023-04-24 MED ORDER — PROPOFOL 10 MG/ML IV BOLUS
INTRAVENOUS | Status: DC | PRN
  Administered 2023-04-24: 20 mg via INTRAVENOUS

## 2023-04-24 MED ORDER — LIDOCAINE 2% (20 MG/ML) 5 ML SYRINGE
INTRAMUSCULAR | Status: DC | PRN
  Administered 2023-04-24: 40 mg via INTRAVENOUS

## 2023-04-24 MED ORDER — CEFAZOLIN SODIUM-DEXTROSE 2-4 GM/100ML-% IV SOLN
2.0000 g | INTRAVENOUS | Status: AC
  Administered 2023-04-24: 2 g via INTRAVENOUS

## 2023-04-24 MED ORDER — MIDAZOLAM HCL 2 MG/2ML IJ SOLN
INTRAMUSCULAR | Status: AC
  Filled 2023-04-24: qty 2

## 2023-04-24 MED ORDER — MIDAZOLAM HCL 2 MG/2ML IJ SOLN
INTRAMUSCULAR | Status: DC | PRN
  Administered 2023-04-24: 1 mg via INTRAVENOUS

## 2023-04-24 MED ORDER — FENTANYL CITRATE (PF) 100 MCG/2ML IJ SOLN
INTRAMUSCULAR | Status: AC
  Filled 2023-04-24: qty 2

## 2023-04-24 MED ORDER — STERILE WATER FOR IRRIGATION IR SOLN
Status: DC | PRN
  Administered 2023-04-24: 3000 mL

## 2023-04-24 MED ORDER — CEFAZOLIN SODIUM-DEXTROSE 2-4 GM/100ML-% IV SOLN
INTRAVENOUS | Status: AC
  Filled 2023-04-24: qty 100

## 2023-04-24 SURGICAL SUPPLY — 21 items
BAG DRAIN URO-CYSTO SKYTR STRL (DRAIN) ×1 IMPLANT
BAG DRN UROCATH (DRAIN) ×1
CATH ROBINSON RED A/P 14FR (CATHETERS) IMPLANT
CLOTH BEACON ORANGE TIMEOUT ST (SAFETY) ×1 IMPLANT
ELECT REM PT RETURN 9FT ADLT (ELECTROSURGICAL)
ELECTRODE REM PT RTRN 9FT ADLT (ELECTROSURGICAL) ×1 IMPLANT
GLOVE BIO SURGEON STRL SZ7.5 (GLOVE) ×1 IMPLANT
GLOVE BIOGEL PI IND STRL 6.5 (GLOVE) IMPLANT
GOWN STRL REUS W/TWL LRG LVL3 (GOWN DISPOSABLE) ×3 IMPLANT
KIT TURNOVER CYSTO (KITS) ×1 IMPLANT
MANIFOLD NEPTUNE II (INSTRUMENTS) ×1 IMPLANT
NDL ASPIRATION 22 (NEEDLE) IMPLANT
NDL SAFETY ECLIP 18X1.5 (MISCELLANEOUS) IMPLANT
NEEDLE ASPIRATION 22 (NEEDLE) ×1
PACK CYSTO (CUSTOM PROCEDURE TRAY) ×1 IMPLANT
SLEEVE SCD COMPRESS KNEE MED (STOCKING) ×1 IMPLANT
SYR 10ML LL (SYRINGE) IMPLANT
SYR 20ML LL LF (SYRINGE) IMPLANT
SYR CONTROL 10ML LL (SYRINGE) IMPLANT
TUBE CONNECTING 12X1/4 (SUCTIONS) ×1 IMPLANT
WATER STERILE IRR 3000ML UROMA (IV SOLUTION) ×1 IMPLANT

## 2023-04-24 NOTE — Discharge Instructions (Signed)
  Post Anesthesia Home Care Instructions  Activity: Get plenty of rest for the remainder of the day. A responsible individual must stay with you for 24 hours following the procedure.  For the next 24 hours, DO NOT: -Drive a car -Operate machinery -Drink alcoholic beverages -Take any medication unless instructed by your physician -Make any legal decisions or sign important papers.  Meals: Start with liquid foods such as gelatin or soup. Progress to regular foods as tolerated. Avoid greasy, spicy, heavy foods. If nausea and/or vomiting occur, drink only clear liquids until the nausea and/or vomiting subsides. Call your physician if vomiting continues.  Special Instructions/Symptoms: Your throat may feel dry or sore from the anesthesia or the breathing tube placed in your throat during surgery. If this causes discomfort, gargle with warm salt water. The discomfort should disappear within 24 hours.  CYSTOSCOPY HOME CARE INSTRUCTIONS  Activity: Rest for the remainder of the day.  Do not drive or operate equipment today.  You may resume normal activities in one to two days as instructed by your physician.   Meals: Drink plenty of liquids and eat light foods such as gelatin or soup this evening.  You may return to a normal meal plan tomorrow.  Return to Work: You may return to work in one to two days or as instructed by your physician.  Special Instructions / Symptoms: Call your physician if any of these symptoms occur:   -persistent or heavy bleeding  -bleeding which continues after first few urination  -large blood clots that are difficult to pass  -urine stream diminishes or stops completely  -fever equal to or higher than 101 degrees Farenheit.  -cloudy urine with a strong, foul odor  -severe pain  Females should always wipe from front to back after elimination.  You may feel some burning pain when you urinate.  This should disappear with time.  Applying moist heat to the lower  abdomen or a hot tub bath may help relieve the pain.   

## 2023-04-24 NOTE — Anesthesia Preprocedure Evaluation (Addendum)
Anesthesia Evaluation  Patient identified by MRN, date of birth, ID band Patient awake    Reviewed: Allergy & Precautions, NPO status , Patient's Chart, lab work & pertinent test results  History of Anesthesia Complications Negative for: history of anesthetic complications  Airway Mallampati: II  TM Distance: >3 FB Neck ROM: Full    Dental  (+) Dental Advisory Given, Teeth Intact   Pulmonary neg pulmonary ROS   Pulmonary exam normal breath sounds clear to auscultation       Cardiovascular hypertension, Pt. on medications (-) angina Normal cardiovascular exam Rhythm:Regular Rate:Normal     Neuro/Psych  PSYCHIATRIC DISORDERS  Depression    MS: walks with walker  Neuromuscular disease    GI/Hepatic Neg liver ROS,GERD  Medicated and Controlled,,  Endo/Other  Sjogren's  Renal/GU negative Renal ROS Bladder dysfunction (self-cath)      Musculoskeletal  (+) Arthritis ,    Abdominal   Peds  Hematology negative hematology ROS (+)   Anesthesia Other Findings   Reproductive/Obstetrics                             Anesthesia Physical Anesthesia Plan  ASA: 3  Anesthesia Plan: MAC   Post-op Pain Management: Tylenol PO (pre-op)*   Induction:   PONV Risk Score and Plan: 2 and Ondansetron, Treatment may vary due to age or medical condition and Dexamethasone  Airway Management Planned: LMA  Additional Equipment: None  Intra-op Plan:   Post-operative Plan:   Informed Consent: I have reviewed the patients History and Physical, chart, labs and discussed the procedure including the risks, benefits and alternatives for the proposed anesthesia with the patient or authorized representative who has indicated his/her understanding and acceptance.     Dental advisory given  Plan Discussed with: CRNA  Anesthesia Plan Comments:         Anesthesia Quick Evaluation

## 2023-04-24 NOTE — Anesthesia Procedure Notes (Signed)
Procedure Name: MAC Date/Time: 04/24/2023 12:26 PM  Performed by: Francie Massing, CRNAPre-anesthesia Checklist: Patient identified, Emergency Drugs available, Suction available and Patient being monitored Oxygen Delivery Method: Simple face mask

## 2023-04-24 NOTE — H&P (Signed)
Office Visit Report     04/03/2023   --------------------------------------------------------------------------------   [name]  MRN: [id number]  DOB: Feb 10, 1956, 67 year old Female  PRIMARY CARE:  Berniece Andreas, MD  PRIMARY CARE FAX:  [phone]  REFERRING:  Cristal Deer A. Liliane Shi, MD  PROVIDER:  Rhoderick Moody, M.D.  LOCATION:  [facility] [phone]     --------------------------------------------------------------------------------   CC/HPI: CC: Bladder infections   HPI: Ms. Gang is a 67 year old female with a history multiple sclerosis incontinence w/o sensation and recurrent urinary tract infections. She performs self-catheterization BID/TID when she feels suprapubic fullness/discomfort. s/p intravesical botox injections (300 units) on 04/10/21.   04/13/19: Patient with above noted history. She presents today with concerns for UTI. She states that currently she is having increased issues with her MS and is going for further testing this week. She states that for the past 1-2 weeks she has been having increased incontinence between spontaneous voids and CIC. She continues to perform CIC x 2-3 times daily. She denies difficulties performing CIC, but did not some discomfort earlier this week. She denies gross hematuria or fevers. She has been using Uribel, which does help. She states that she was treated about 6 weeks ago for an episode of cystitis, but it has been approximately one year prior to this event. She remains on keflex, cranberry tablets, D-mannose, probiotics and Vit C for UTI prevention.   02/29/20: The patient his here today for a f/u after developing dysuria and a suspected UTI. She states that her urinary sxs cleared after a course of keflex. She had a urine culture performed on 7/28 that showed no growth. UA today is clear. She continues to perform CIC w/o difficulty. Of note, she is frustrated with the lack of communication between the  triage and nursing staff concerning her recent issues.   10/31/2020: Patient with above noted past medical history. She is currently not having any issues but is going on a cruise and wanted to ensure she was not having any infections. She also wanted something for urgency and frequency if needed. She currently denies any increased urgency, frequency, dysuria. Her urinalysis is not overly concerning for an infection today. She recently picked up her cephalexin which she takes as a suppressive antibiotic. Marland Kitchen She is going on a cruise to [place] and is very excited about this.   03/11/21: [name] for a routine follow-up. Diarrhea has worsened over the pat 6 months--currently on daily keflex. She was prescribed oxybutynin at her last visit, but admits that she has not started the medication out of concern for dry eye side effects. She reports that she is completely incontinent and wears 3-5 pads per day. She continues to perform CIC 3 times daily w/o any discomfort.   04/26/21: [name] for a routine follow-up after her recent botox injections. She reports marked improvement in her incontinence. She is not requiring protective pads (was previously wearing 3-5). No issues cathing and denies malodorous urine, hematuria or dysuria.   09/16/21: [name] for a routine follow-up. The patient states that her incontinence remains very well managed by her previous Botox injections. She reports minimal incontinence and is not requiring any protective pads. She continues to perform CIC every 4-6 hours throughout the day. She denies any interval UTIs or dysuria. She does note 1 episode of gross hematuria immediately following catheterization that quickly resolved.   03/17/22: The patient is here  today for a routine follow-up. She is doing well and reports no new health issues. She notes that over the past 3 to 4 weeks she has seen an increase in urinary urgency and urge  incontinence. She continues to catheterize every 4-6 hours without any pain. She denies interval UTIs, dysuria or gross hematuria. She continues to take Keflex 250 mg daily for UTI prophylaxis.   03/27/22: The patient presents back today with crampy lower abdominal pain and increased urinary urgency. Her urine culture from May 2023 showed no growth and her UA today is unremarkable. She denies any significant dysuria, flank pain or hematuria. She does have intermittent issues with constipation, but states that her bowel movements have been regular for her. She denies fevers or chills. No issues cathing.   09/15/22: [name] for a routine follow-up. She notes increased urgency and urge incontinence over the past 3-4 weeks. No interval UTIs since her last visit. No issues cathing. Her daughter was recently diagnosed with MS as well.   04/03/23: [name] for a routine follow-up. Her only complaint today is LLQ pain that has gradually worsened over the past 2-3 days. Hx of diverticulosis/-itis. No hx of stones. Denies fever/chills, nausea/vomiting. From a urinary standpoint, she notes a slight increase in urinary urgency/incontinence. She also notes a small amount of blood per urethra shortly after cathing.     ALLERGIES: Meclizine HCl TABS - Other Reaction, hallucination Omeprazole TBEC - Hives Tramadol - Itching, Hives    MEDICATIONS: Cephalexin 250 mg capsule 1 capsule PO Daily  Oxybutynin Chloride 5 mg tablet 1 tablet PO BID PRN  Amlodipine Besylate 2.5 mg tablet  Biotin  Cranberry  Cymbalta 30 mg capsule,delayed release  Florajen Acidophilus 20 billion cell capsule 1 capsule PO Daily  Gabapentin 600 mg tablet  Losartan-Hydrochlorothiazide  NexIUM PACK Oral  Tysabri 300 MG/15ML Intravenous Concentrate Intravenous  Uribel 118 mg-10 mg-40.8 mg-36 mg-0.12 mg capsule 1 capsule PO Q 8 H PRN Take as needed for painful urination  Vitamin B12  Vitamin C  Vitamin D3  5000 UNIT Oral Tablet Oral     GU PSH: Cystourethroscopy, W/Injection For Chemodenervation Of Bladder - 10/15/2022, 04/09/2022, 10/25/2021, 04/10/2021 Hysterectomy Unilat SO - 2013       PSH Notes: Bladder Surgery   NON-GU PSH: Rotator Cuff Surgery.. Shoulder Surgery (Unspecified) Visit Complexity (formerly [id number]) - 09/15/2022     GU PMH: Areflexic bladder - 09/15/2022, - 03/27/2022, - 03/17/2022, - 2023, - 04/26/2021, - 2022, - 2022, - 2019, - 2017, Hypotonic bladder, - 2016 Incontinence w/o Sensation - 09/15/2022, - 03/27/2022, - 2023, - 04/26/2021, - 2022, Urinary incontinence without sensory awareness, - 2016 Pelvic/perineal pain (Stable) - 03/27/2022, Abdominal pain, suprapubic, - 2015 Chronic cystitis (w/o hematuria) - 03/17/2022, - 2023, - 2022, - 2019, - 2017, Chronic cystitis, - 2015 Acute Cystitis/UTI - 2019 Dysuria, Dysuria - 2016 Urinary Tract Inf, Unspec site, Pyuria - 2015, Urinary tract infection, - 2014    NON-GU PMH: Multiple sclerosis - 09/15/2022, - 03/17/2022, - 2022, Multiple sclerosis, - 2016 Encounter for general adult medical examination without abnormal findings, Encounter for preventive health examination - 2015 Anxiety, Anxiety (Symptom) - 2014 Personal history of other mental and behavioral disorders, History of depression - 2014 Personal history of other specified conditions, History of heartburn - 2014 Migraine, unspecified, not intractable, without status migrainosus    FAMILY HISTORY: Death In The Family Father - Father Family Health Status - Mother's Age - Mother  Family Health Status Number - Runs In Family Lung Cancer - Father   SOCIAL HISTORY: Marital Status: Married Preferred Language: English; Ethnicity: Not Hispanic Or Latino; Race: White Current Smoking Status: Patient has never smoked.  Does not use smokeless tobacco. Drinks 2 drinks per day.  Does not use drugs. Drinks 2 caffeinated drinks per day. Patient's occupation is/was Retired.    REVIEW  OF SYSTEMS:    GU Review Female:   Patient denies have to strain to urinate, being pregnant, stream starts and stops, trouble starting your stream, hard to postpone urination, get up at night to urinate, frequent urination, leakage of urine, and burning /pain with urination.  Gastrointestinal (Upper):   Patient denies nausea, vomiting, and indigestion/ heartburn.  Gastrointestinal (Lower):   Patient denies diarrhea and constipation.  Constitutional:   Patient denies fever, night sweats, weight loss, and fatigue.  Skin:   Patient denies skin rash/ lesion and itching.  Eyes:   Patient denies blurred vision and double vision.  Ears/ Nose/ Throat:   Patient denies sore throat and sinus problems.  Hematologic/Lymphatic:   Patient denies swollen glands and easy bruising.  Cardiovascular:   Patient denies leg swelling and chest pains.  Respiratory:   Patient denies cough and shortness of breath.  Endocrine:   Patient denies excessive thirst.  Musculoskeletal:   Patient denies back pain and joint pain.  Neurological:   Patient denies headaches and dizziness.  Psychologic:   Patient denies depression and anxiety.   VITAL SIGNS: None   MULTI-SYSTEM PHYSICAL EXAMINATION:    Constitutional: Well-nourished. No physical deformities. Normally developed. Good grooming.  Neurologic / Psychiatric: Oriented to time, oriented to place, oriented to person. No depression, no anxiety, no agitation.  Musculoskeletal: Normal gait and station of head and neck.     Complexity of Data:  Records Review:   Previous Hospital Records, Previous Patient Records   PROCEDURES:          Visit Complexity - G2211          Urinalysis Dipstick Dipstick Cont'd  Color: Yellow Bilirubin: Neg mg/dL  Appearance: Clear Ketones: Neg mg/dL  Specific Gravity: 2.952 Blood: Neg ery/uL  pH: 7.5 Protein: Neg mg/dL  Glucose: Neg mg/dL Urobilinogen: 0.2 mg/dL    Nitrites: Neg    Leukocyte Esterase: Neg leu/uL    ASSESSMENT:       ICD-10 Details  1 GU:   Areflexic bladder - N31.2 Chronic, Stable  2   Incontinence w/o Sensation - N39.42 Chronic, Stable  3   LLQ pain - R10.32 Acute, Uncomplicated   PLAN:           Schedule Return Visit/Planned Activity: Next Available Appointment - Schedule Surgery          Document Letter(s):  Created for Patient: Clinical Summary         Notes:   -Start Keflex twice daily. If her left lower quadrant pain is not resolved by Monday, I encouraged her to call her PCP to her GI doctor for additional treatment of suspected diverticulitis.  -Repeat botox (300 units) in the coming weeks

## 2023-04-24 NOTE — Op Note (Signed)
Operative Note  Preoperative diagnosis:  1.  Refractory urge incontinence 2.  History of multiple sclerosis  Postoperative diagnosis: Same  Procedure(s): 1.  Cystoscopy with intravesical Botox injections (300 units)  Surgeon: Rhoderick Moody, MD  Assistants:  None  Anesthesia:  General MAC  Complications:  None  EBL: Less than 5 mL  Specimens: 1.  None  Drains/Catheters: 1.  None  Intraoperative findings:   Moderate to severe bladder trabeculation with no other intravesical abnormalities  Indication:  [name] is a 67 y.o. female with a history of refractory urge incontinence and multiple sclerosis.  Her urge incontinence has been well-managed with intravesical Botox injections in the past and she is here today for repeat injection.  She has been consented for the above procedures, voices understanding and wishes to proceed.  Description of procedure:  After informed consent was obtained, the patient was brought to the operating room and general MAC anesthesia was administered. The patient was then placed in the dorsolithotomy position and prepped and draped in the usual sterile fashion. A timeout was performed. A 23 French rigid cystoscope was then inserted into the urethral meatus and advanced into the bladder under direct vision. A complete bladder survey revealed no intravesical pathology.  I then injected a total of 300 units of Botox in the detrusor musculature in a grid like fashion and 0.5 mL aliquots.  There was no significant bleeding following the series of injections.  Her bladder was then drained.  She tolerated the procedure well and was transferred to the postanesthesia in stable condition.  Plan: Discharge home.  Resume clean remittent catheterization

## 2023-04-24 NOTE — Transfer of Care (Signed)
Immediate Anesthesia Transfer of Care Note  Patient: [name]  Procedure(s) Performed: Procedure(s) (LRB): CYSTOSCOPY WITH BOTOX INJECTION 300 UNITS (N/A)  Patient Location: PACU  Anesthesia Type: General  Level of Consciousness: awake, oriented, sedated and patient cooperative  Airway & Oxygen Therapy: Patient Spontanous Breathing and Patient connected to face mask oxygen  Post-op Assessment: Report given to PACU RN and Post -op Vital signs reviewed and stable  Post vital signs: Reviewed and stable  Complications: No apparent anesthesia complications  Last Vitals:  Vitals Value Taken Time  BP 111/51 04/24/23 1254  Temp    Pulse 76 04/24/23 1256  Resp 17 04/24/23 1255  SpO2 100 % 04/24/23 1256  Vitals shown include unfiled device data.  Last Pain:  Vitals:   04/24/23 1047  TempSrc: Oral         Complications: No notable events documented.

## 2023-04-27 ENCOUNTER — Encounter (HOSPITAL_BASED_OUTPATIENT_CLINIC_OR_DEPARTMENT_OTHER): Payer: Self-pay | Admitting: Urology

## 2023-04-27 NOTE — Anesthesia Postprocedure Evaluation (Signed)
Anesthesia Post Note  Patient: [name]  Procedure(s) Performed: CYSTOSCOPY WITH BOTOX INJECTION 300 UNITS (Bladder)     Patient location during evaluation: PACU Anesthesia Type: MAC Level of consciousness: awake and alert Pain management: pain level controlled Vital Signs Assessment: post-procedure vital signs reviewed and stable Respiratory status: spontaneous breathing Cardiovascular status: stable Anesthetic complications: no   No notable events documented.  Last Vitals:  Vitals:   04/24/23 1327 04/24/23 1352  BP: 119/62 136/65  Pulse: 72 75  Resp: (!) 79 18  Temp: 36.6 C   SpO2: 97% 96%    Last Pain:  Vitals:   04/24/23 1327  TempSrc:   PainSc: 0-No pain                 Lewie Loron

## 2023-04-29 DIAGNOSIS — M25579 Pain in unspecified ankle and joints of unspecified foot: Secondary | ICD-10-CM | POA: Diagnosis not present

## 2023-05-03 ENCOUNTER — Other Ambulatory Visit: Payer: Self-pay | Admitting: Internal Medicine

## 2023-05-06 DIAGNOSIS — M25579 Pain in unspecified ankle and joints of unspecified foot: Secondary | ICD-10-CM | POA: Diagnosis not present

## 2023-05-13 DIAGNOSIS — M25579 Pain in unspecified ankle and joints of unspecified foot: Secondary | ICD-10-CM | POA: Diagnosis not present

## 2023-05-19 DIAGNOSIS — Z79899 Other long term (current) drug therapy: Secondary | ICD-10-CM | POA: Diagnosis not present

## 2023-05-19 DIAGNOSIS — M7918 Myalgia, other site: Secondary | ICD-10-CM | POA: Diagnosis not present

## 2023-05-19 DIAGNOSIS — R2689 Other abnormalities of gait and mobility: Secondary | ICD-10-CM | POA: Diagnosis not present

## 2023-05-19 DIAGNOSIS — G35 Multiple sclerosis: Secondary | ICD-10-CM | POA: Diagnosis not present

## 2023-05-20 DIAGNOSIS — M25579 Pain in unspecified ankle and joints of unspecified foot: Secondary | ICD-10-CM | POA: Diagnosis not present

## 2023-05-27 DIAGNOSIS — M25579 Pain in unspecified ankle and joints of unspecified foot: Secondary | ICD-10-CM | POA: Diagnosis not present

## 2023-06-08 ENCOUNTER — Ambulatory Visit: Payer: Medicare Other | Attending: Neurology

## 2023-06-08 DIAGNOSIS — F40298 Other specified phobia: Secondary | ICD-10-CM | POA: Diagnosis present

## 2023-06-08 DIAGNOSIS — R269 Unspecified abnormalities of gait and mobility: Secondary | ICD-10-CM | POA: Diagnosis not present

## 2023-06-08 DIAGNOSIS — R2689 Other abnormalities of gait and mobility: Secondary | ICD-10-CM | POA: Diagnosis not present

## 2023-06-08 DIAGNOSIS — R2681 Unsteadiness on feet: Secondary | ICD-10-CM | POA: Diagnosis not present

## 2023-06-08 NOTE — Therapy (Unsigned)
OUTPATIENT PHYSICAL THERAPY NEURO EVALUATION   Patient Name: [name] MRN: [id number] DOB:12-17-1955, 67 y.o., female Today's Date: 06/09/2023   PCP: Madelin Headings, MD  REFERRING PROVIDER: Curt Bears, MD   END OF SESSION:  PT End of Session - 06/08/23 1534     Visit Number 1    Number of Visits 17    Date for PT Re-Evaluation 08/07/23    Authorization Type [employer]    PT Start Time 1532    PT Stop Time 1620    PT Time Calculation (min) 48 min    Equipment Utilized During Treatment Gait belt    Activity Tolerance Patient tolerated treatment well             Past Medical History:  Diagnosis Date   Ambulates with cane 04/08/2021   prn occ walker prn   Arthritis    left knee   Diverticulosis of colon    GERD (gastroesophageal reflux disease)    History of concussion 2019   no residual   Hypertension    Multiple sclerosis, relapsing-remitting (HCC) 2007   neurologist--- dr Theodosia Paling ([facility] [place])   Neurogenic bladder    Self-catheterizes urinary bladder 04/08/2021   self caths 3 to 4 x daily   Sjogren's syndrome (HCC) 10/23/2021   runs vaporizer in room @ night to help with   Torn ligament 04/01/2022   left knee   Urge incontinence    Uses walker 04/08/2021   prn   Varicose veins of both lower extremities    Past Surgical History:  Procedure Laterality Date   BOTOX INJECTION N/A 04/10/2021   Procedure: BOTOX INJECTION WITH CYSTOSCOPY;  Surgeon: Rene Paci, MD;  Location: [facility];  Service: Urology;  Laterality: N/A;   BOTOX INJECTION N/A 10/25/2021   Procedure: CYSTOSCOPY WITH BOTOX INJECTION 300 UNITS;  Surgeon: Rene Paci, MD;  Location: [facility];  Service: Urology;  Laterality: N/A;   BOTOX INJECTION N/A 04/09/2022   Procedure: BOTOX INJECTION 300 units WITH CYSTOSCOPY;  Surgeon: Rene Paci, MD;  Location: [facility];  Service: Urology;  Laterality: N/A;   BOTOX INJECTION N/A 10/15/2022   Procedure: BOTOX  300 UNITSINJECTION;  Surgeon: Rene Paci, MD;  Location: [facility];  Service: Urology;  Laterality: N/A;   CATARACT EXTRACTION W/ INTRAOCULAR LENS IMPLANT Left 2022   COLONOSCOPY WITH PROPOFOL  01/10/2015   dr d. Juanda Chance   CYSTOSCOPY WITH INJECTION N/A 10/15/2022   Procedure: CYSTOSCOPY WITH INJECTION;  Surgeon: Rene Paci, MD;  Location: [facility];  Service: Urology;  Laterality: N/A;  30 MINS   CYSTOSCOPY WITH INJECTION N/A 04/24/2023   Procedure: CYSTOSCOPY WITH BOTOX INJECTION 300 UNITS;  Surgeon: Rene Paci, MD;  Location: [facility];  Service: Urology;  Laterality: N/A;   SHOULDER ARTHROSCOPY WITH ROTATOR CUFF REPAIR AND SUBACROMIAL DECOMPRESSION  06/01/2012   Procedure: SHOULDER ARTHROSCOPY WITH ROTATOR CUFF REPAIR AND SUBACROMIAL DECOMPRESSION;  Surgeon: Drucilla Schmidt, MD;  Location: [facility];  Service: Orthopedics;  Laterality: Left;  LEFT SHOULDER ARTHROSCOPY WITH SUBACROMIAL DECOMPRESION AND DEBRIDEMENT OF PARTIAL ROTATOR CUFF TEAR   TONSILLECTOMY AND ADENOIDECTOMY  1962   TOTAL VAGINAL HYSTERECTOMY  12/01/2006   @[facility]  by dr m. Vincente Poli;   W/ ANTERIOR & POSTERIOR REPAIR AND SACROPINOUS LIGAMENT FIXATION OF VAGINAL VAULT   Patient Active Problem List   Diagnosis Date Noted  Positive ANA (antinuclear antibody) 11/28/2021   Discoloration of skin of toe 11/28/2021   Rectal bleeding 12/05/2016   Internal hemorrhoids 12/05/2016   Hx of falling 03/17/2016   Nausea alone 02/13/2014   Other malaise and fatigue 02/13/2014   Postmenopausal estrogen deficiency 02/24/2013   Visit for preventive health examination 02/24/2013   Family hx osteoporosis 02/21/2013   Recurrent UTI 01/09/2012   Suprapubic tenderness 12/01/2011   GERD (gastroesophageal reflux disease)  10/15/2011   Dysphagia, pharyngoesophageal 10/15/2011   Voiding dysfunction 09/24/2011   Hypertriglyceridemia 02/09/2011   Vitamin D deficiency 12/13/2008   Constipation 12/13/2008   MUSCLE SPASM 12/13/2008   DIZZINESS 12/13/2008   HYPOKALEMIA, HX OF 12/13/2008   DISORDER, ADJUSTMENT W/DEPRESSED MOOD 04/30/2007   SCLEROSIS, MULTIPLE 04/30/2007   Essential hypertension 04/30/2007   VARICOSE VEIN, LWR EXTREMITIES W/INFLAMMATION 04/30/2007   Allergic rhinitis 04/30/2007    ONSET DATE: 05/22/2023  REFERRING DIAG: G35 (ICD-10-CM) - Multiple sclerosis R26.89 (ICD-10-CM) - Other abnormalities of gait and mobility  Rationale for Evaluation and Treatment: Rehabilitation  SUBJECTIVE:                                                                                                                                                                                             SUBJECTIVE STATEMENT: Patient presented to physical therapy by herself with a rollator. She has a past history of MS for 20 years. She reported recent episodes of panic and fear of falling, worsening for the past 6 months. She goes to PT once a week a [facility] [facility] and also has a Systems analyst at [employer]. She has not fallen in the last year but has had many near falls and catches herself with nearby walls/furniture. Her last fall was November 2023 which resulted in an L1-L2 compression fracture. She has started using her rollator all the time, including in the home, for the past 3 months and reported "I feel unsteady". She describes these episodes are worse in crowded spaces and gave example of checking in at the front of the gym when multiple other people are also walking by to check in, she freezes and panics due to fear of falling. She can drive but also utilizes [employer] when her husband needs the car.   Pt accompanied by: self  PERTINENT HISTORY: MS relapsing-remitting, arthritis, hypertension   PAIN:  Are you having  pain? No  PRECAUTIONS: Fall  RED FLAGS: None   WEIGHT BEARING RESTRICTIONS: No  FALLS: Has patient fallen in last 6 months?  Last fall was one year ago, many near falls in the last 6 months  LIVING ENVIRONMENT: Lives with: lives with their spouse Lives in: House/apartment Stairs: Yes: External: 4 steps; on right going up Has following equipment at home: Single point cane and Walker - 4 wheeled  PLOF: Independent with basic ADLs and Independent with household mobility with device  PATIENT GOALS: "stand on my own and not panic about falling"   OBJECTIVE:  Note: Objective measures were completed at Evaluation unless otherwise noted.  DIAGNOSTIC FINDINGS: 10/09/2022 MR Lumbar Spine  IMPRESSION: 1. Chronic L1 vertebral body compression fracture with mild marrow edema along the anterior superior corner and approximately 60% height loss. 2. No acute osseous injury of the lumbar spine. 3. Mild lumbar spine spondylosis as described above.  COGNITION: Overall cognitive status: Within functional limits for tasks assessed   SENSATION: WFL   POSTURE: rounded shoulders, forward head, decreased lumbar lordosis, and increased thoracic kyphosis   LOWER EXTREMITY MMT:    MMT Right Eval Left Eval  Hip flexion 3 4  Hip extension    Hip abduction 3 3  Hip adduction 4 4  Hip internal rotation    Hip external rotation    Knee flexion 4 5  Knee extension 4 5  Ankle dorsiflexion 4 4  Ankle plantarflexion 4 4  Ankle inversion    Ankle eversion    (Blank rows = not tested)    FUNCTIONAL TESTS:     06/08/23   Standardized Balance Assessment  Standardized Balance Assessment Berg Balance Test  Five times sit to stand comments  28.25 (with UE assist)  Berg Balance Test  Sit to Stand 2  Standing Unsupported 4  Sitting with Back Unsupported but Feet Supported on Floor or Stool 4  Stand to Sit 1  Transfers 3  Standing Unsupported with Eyes Closed 4  Standing Unsupported  with Feet Together 4  From Standing, Reach Forward with Outstretched Arm 3  From Standing Position, Pick up Object from Floor 3  From Standing Position, Turn to Look Behind Over each Shoulder 4  Turn 360 Degrees 2  Standing Unsupported, Alternately Place Feet on Step/Stool 0  Standing Unsupported, One Foot in Front 1  Standing on One Leg 1  Total Score 36    TODAY'S TREATMENT:                                                                                                                               - Eval   PATIENT EDUCATION: Education details: PT exam findings, POC length Person educated: Patient Education method: Explanation Education comprehension: verbalized understanding  HOME EXERCISE PROGRAM: To be given at next session   GOALS: Goals reviewed with patient? Yes  SHORT TERM GOALS: Target date: 07/10/23  Pt will be independent with initial HEP for improved balance and decreased fear of falling.  Baseline: to be given  Goal status: INITIAL  2.  ABC goal Baseline: to be assessed  Goal status: INITIAL  3.  Pt will improve 5x STS  to </= 25 sec to demo improved functional LE strength and transfers.  Baseline: 28.25 sec Goal status: INITIAL  4.  Pt will improve Berg score to 42 to demonstrate improved balance with standing and weight shifting and decreased fall risk.  Baseline: 36 Goal status: INITIAL   LONG TERM GOALS: Target date: 08/07/23  Pt will be independent with final HEP for improved balance and decreased fear of falling.  Baseline: to be given Goal status: INITIAL  2.   ABC goal Baseline: to be assessed Goal status: INITIAL  3.  Pt will improve 5x STS to </= 22 sec to demo improved functional LE strength and transfers.  Baseline: 28.25 sec Goal status: INITIAL  4.  Pt will improve Berg score to 45 to demonstrate improved balance with standing and weight shifting and decreased fall risk.  Baseline: 36 Goal status:  INITIAL  ASSESSMENT:  CLINICAL IMPRESSION: Patient is a 67 y.o. female who was seen today for physical therapy evaluation and treatment for balance and gait. She discussed her fear of falling and episodes of panic and freezing, especially in crowded spaces. She is reliant on her rollator, her husband, and nearby walls/furniture to avoid falling. When performing sit to stands, she uses momentum and rocking techniques, as well as needing UE support to get up. In standing, she utilizes a wide BOS to maintain standing balance. Patient demonstrated increased fall risk noted by score of 36/56 on the [facility] Scale.  <45/56 = fall risk, <42/56 = predictive of recurrent falls, <40/56 = 100% fall risk  >41 = independent, 21-40 = assistive device, 0-20 = wheelchair level  MDC 6.9 (4 pts 45-56, 5 pts 35-44, 7 pts 25-34) (ANPTA Core Set of Outcome Measures for Adults with Neurologic Conditions, 2018) Patient would benefit from skilled physical therapy to address her balance and gait to prevent future falls.   OBJECTIVE IMPAIRMENTS: Abnormal gait, decreased balance, decreased coordination, decreased mobility, difficulty walking, and decreased strength.   ACTIVITY LIMITATIONS: carrying, lifting, bending, standing, stairs, locomotion level, and caring for others  PARTICIPATION LIMITATIONS: meal prep, cleaning, laundry, shopping, and community activity  PERSONAL FACTORS: Age, Time since onset of injury/illness/exacerbation, and Transportation are also affecting patient's functional outcome.   REHAB POTENTIAL: Good  CLINICAL DECISION MAKING: Stable/uncomplicated  EVALUATION COMPLEXITY: Low  PLAN:  PT FREQUENCY: 1-2x/week  PT DURATION: 8 weeks  PLANNED INTERVENTIONS: 97164- PT Re-evaluation, 97110-Therapeutic exercises, 97530- Therapeutic activity, 97112- Neuromuscular re-education, 97535- Self Care, 16109- Manual therapy, [phone]- Gait training, [phone]- Orthotic Fit/training, [phone]- Aquatic Therapy,  Patient/Family education, Balance training, Stair training, Dry Needling, Vestibular training, and DME instructions  PLAN FOR NEXT SESSION: Start HEP, give ABC, low level balance exercises    Teriah Muela M Shaunie Boehm, SPT Westley Foots, PT, DPT, CBIS  06/09/2023, 8:47 AM

## 2023-06-09 NOTE — Addendum Note (Signed)
Addended by: Merry Lofty A on: 06/09/2023 08:50 AM   Modules accepted: Orders

## 2023-06-11 DIAGNOSIS — H524 Presbyopia: Secondary | ICD-10-CM | POA: Diagnosis not present

## 2023-06-11 DIAGNOSIS — H2511 Age-related nuclear cataract, right eye: Secondary | ICD-10-CM | POA: Diagnosis not present

## 2023-06-11 DIAGNOSIS — H26492 Other secondary cataract, left eye: Secondary | ICD-10-CM | POA: Diagnosis not present

## 2023-06-12 ENCOUNTER — Telehealth: Payer: Self-pay

## 2023-06-12 NOTE — Progress Notes (Signed)
   06/12/2023  Patient ID: [name], female   DOB: 1956/06/17, 67 y.o.   MRN: [id number]  Returning a call from the patient who called the office last week asking to speak with me. Left HIPAA compliant message for patient to return my call at their convenience.  Sherrill Raring, PharmD Clinical Pharmacist [phone]

## 2023-06-15 ENCOUNTER — Ambulatory Visit: Payer: Medicare Other | Admitting: Physical Therapy

## 2023-06-15 ENCOUNTER — Encounter: Payer: Self-pay | Admitting: Physical Therapy

## 2023-06-15 DIAGNOSIS — R2689 Other abnormalities of gait and mobility: Secondary | ICD-10-CM | POA: Diagnosis not present

## 2023-06-15 DIAGNOSIS — R269 Unspecified abnormalities of gait and mobility: Secondary | ICD-10-CM | POA: Diagnosis not present

## 2023-06-15 DIAGNOSIS — R2681 Unsteadiness on feet: Secondary | ICD-10-CM | POA: Diagnosis not present

## 2023-06-15 NOTE — Therapy (Signed)
OUTPATIENT PHYSICAL THERAPY NEURO TREATMENT   Patient Name: [name] MRN: [id number] DOB:03-Nov-1955, 67 y.o., female Today's Date: 06/15/2023   PCP: Madelin Headings, MD  REFERRING PROVIDER: Curt Bears, MD   END OF SESSION:  PT End of Session - 06/15/23 1535     Visit Number 2    Number of Visits 17    Date for PT Re-Evaluation 08/07/23    Authorization Type [employer]    PT Start Time 1532    PT Stop Time 1611    PT Time Calculation (min) 39 min    Equipment Utilized During Treatment --    Activity Tolerance Patient tolerated treatment well    Behavior During Therapy [facility] for tasks assessed/performed             Past Medical History:  Diagnosis Date   Ambulates with cane 04/08/2021   prn occ walker prn   Arthritis    left knee   Diverticulosis of colon    GERD (gastroesophageal reflux disease)    History of concussion 2019   no residual   Hypertension    Multiple sclerosis, relapsing-remitting (HCC) 2007   neurologist--- dr Theodosia Paling ([facility] [place]   Neurogenic bladder    Self-catheterizes urinary bladder 04/08/2021   self caths 3 to 4 x daily   Sjogren's syndrome (HCC) 10/23/2021   runs vaporizer in room @ night to help with   Torn ligament 04/01/2022   left knee   Urge incontinence    Uses walker 04/08/2021   prn   Varicose veins of both lower extremities    Past Surgical History:  Procedure Laterality Date   BOTOX INJECTION N/A 04/10/2021   Procedure: BOTOX INJECTION WITH CYSTOSCOPY;  Surgeon: Rene Paci, MD;  Location: [facility];  Service: Urology;  Laterality: N/A;   BOTOX INJECTION N/A 10/25/2021   Procedure: CYSTOSCOPY WITH BOTOX INJECTION 300 UNITS;  Surgeon: Rene Paci, MD;  Location: [facility];  Service: Urology;  Laterality: N/A;   BOTOX INJECTION N/A 04/09/2022   Procedure: BOTOX INJECTION 300 units WITH CYSTOSCOPY;   Surgeon: Rene Paci, MD;  Location: [facility];  Service: Urology;  Laterality: N/A;   BOTOX INJECTION N/A 10/15/2022   Procedure: BOTOX  300 UNITSINJECTION;  Surgeon: Rene Paci, MD;  Location: [facility];  Service: Urology;  Laterality: N/A;   CATARACT EXTRACTION W/ INTRAOCULAR LENS IMPLANT Left 2022   COLONOSCOPY WITH PROPOFOL  01/10/2015   dr d. Juanda Chance   CYSTOSCOPY WITH INJECTION N/A 10/15/2022   Procedure: CYSTOSCOPY WITH INJECTION;  Surgeon: Rene Paci, MD;  Location: [facility];  Service: Urology;  Laterality: N/A;  30 MINS   CYSTOSCOPY WITH INJECTION N/A 04/24/2023   Procedure: CYSTOSCOPY WITH BOTOX INJECTION 300 UNITS;  Surgeon: Rene Paci, MD;  Location: [facility];  Service: Urology;  Laterality: N/A;   SHOULDER ARTHROSCOPY WITH ROTATOR CUFF REPAIR AND SUBACROMIAL DECOMPRESSION  06/01/2012   Procedure: SHOULDER ARTHROSCOPY WITH ROTATOR CUFF REPAIR AND SUBACROMIAL DECOMPRESSION;  Surgeon: Drucilla Schmidt, MD;  Location: [facility];  Service: Orthopedics;  Laterality: Left;  LEFT SHOULDER ARTHROSCOPY WITH SUBACROMIAL DECOMPRESION AND DEBRIDEMENT OF PARTIAL ROTATOR CUFF TEAR   TONSILLECTOMY AND ADENOIDECTOMY  1962   TOTAL VAGINAL HYSTERECTOMY  12/01/2006   @[facility]  by dr m. grewal;   W/ ANTERIOR & POSTERIOR REPAIR AND SACROPINOUS LIGAMENT FIXATION OF VAGINAL VAULT  Patient Active Problem List   Diagnosis Date Noted   Positive ANA (antinuclear antibody) 11/28/2021   Discoloration of skin of toe 11/28/2021   Rectal bleeding 12/05/2016   Internal hemorrhoids 12/05/2016   Hx of falling 03/17/2016   Nausea alone 02/13/2014   Other malaise and fatigue 02/13/2014   Postmenopausal estrogen deficiency 02/24/2013   Visit for preventive health examination 02/24/2013   Family hx osteoporosis 02/21/2013   Recurrent UTI 01/09/2012   Suprapubic tenderness  12/01/2011   GERD (gastroesophageal reflux disease) 10/15/2011   Dysphagia, pharyngoesophageal 10/15/2011   Voiding dysfunction 09/24/2011   Hypertriglyceridemia 02/09/2011   Vitamin D deficiency 12/13/2008   Constipation 12/13/2008   MUSCLE SPASM 12/13/2008   DIZZINESS 12/13/2008   HYPOKALEMIA, HX OF 12/13/2008   DISORDER, ADJUSTMENT W/DEPRESSED MOOD 04/30/2007   SCLEROSIS, MULTIPLE 04/30/2007   Essential hypertension 04/30/2007   VARICOSE VEIN, LWR EXTREMITIES W/INFLAMMATION 04/30/2007   Allergic rhinitis 04/30/2007    ONSET DATE: 05/22/2023  REFERRING DIAG: G35 (ICD-10-CM) - Multiple sclerosis R26.89 (ICD-10-CM) - Other abnormalities of gait and mobility  Rationale for Evaluation and Treatment: Rehabilitation  SUBJECTIVE:                                                                                                                                                                                             SUBJECTIVE STATEMENT: No changes, no falls. Does ok when she is at home, but when she is in an unfamiliar place, that's when she is afraid. Does not feel this way when she has her rollator. This happens when she is an open space by herself. Notes fear of falling has gotten worse in the past month. Notes more frozen episodes at night.   Pt accompanied by: self  PERTINENT HISTORY: MS relapsing-remitting, arthritis, hypertension   PAIN:  Are you having pain? No  PRECAUTIONS: Fall  RED FLAGS: None   WEIGHT BEARING RESTRICTIONS: No  FALLS: Has patient fallen in last 6 months?  Last fall was one year ago, many near falls in the last 6 months   LIVING ENVIRONMENT: Lives with: lives with their spouse Lives in: House/apartment Stairs: Yes: External: 4 steps; on right going up Has following equipment at home: Single point cane and Walker - 4 wheeled  PLOF: Independent with basic ADLs and Independent with household mobility with device  PATIENT GOALS: "stand on my own  and not panic about falling"   OBJECTIVE:  Note: Objective measures were completed at Evaluation unless otherwise noted.  DIAGNOSTIC FINDINGS: 10/09/2022 MR Lumbar Spine  IMPRESSION: 1. Chronic L1 vertebral body compression fracture with mild marrow edema along the anterior superior  corner and approximately 60% height loss. 2. No acute osseous injury of the lumbar spine. 3. Mild lumbar spine spondylosis as described above.  COGNITION: Overall cognitive status: Within functional limits for tasks assessed   SENSATION: WFL   POSTURE: rounded shoulders, forward head, decreased lumbar lordosis, and increased thoracic kyphosis   LOWER EXTREMITY MMT:    MMT Right Eval Left Eval  Hip flexion 3 4  Hip extension    Hip abduction 3 3  Hip adduction 4 4  Hip internal rotation    Hip external rotation    Knee flexion 4 5  Knee extension 4 5  Ankle dorsiflexion 4 4  Ankle plantarflexion 4 4  Ankle inversion    Ankle eversion    (Blank rows = not tested)    FUNCTIONAL TESTS:     06/08/23   Standardized Balance Assessment  Standardized Balance Assessment Berg Balance Test  Five times sit to stand comments  28.25 (with UE assist)  Berg Balance Test  Sit to Stand 2  Standing Unsupported 4  Sitting with Back Unsupported but Feet Supported on Floor or Stool 4  Stand to Sit 1  Transfers 3  Standing Unsupported with Eyes Closed 4  Standing Unsupported with Feet Together 4  From Standing, Reach Forward with Outstretched Arm 3  From Standing Position, Pick up Object from Floor 3  From Standing Position, Turn to Look Behind Over each Shoulder 4  Turn 360 Degrees 2  Standing Unsupported, Alternately Place Feet on Step/Stool 0  Standing Unsupported, One Foot in Front 1  Standing on One Leg 1  Total Score 36    TODAY'S TREATMENT:                                                                                                                               Therapeutic  Activity: ABC Scale: 36.25% confidence  Pt really wanting to work on section 8, 10, 11, 12, and 13    Access Code: [email or web address] URL: [email or web address] Date: 06/15/2023 Prepared by: Sherlie Ban  Initiated HEP and educated on purpose of exercises:   Exercises - Sit to Stand with Armchair  - 2 x daily - 5 x weekly - 2 sets - 5 reps - needs cues to scoot out towards the edge, tuck feet back, and incr nose over toes. Also cued to stand up with tall posture. Also performed additional reps during session with continued cues. Pt also needs cues to reach back and lower slowly to the mat table  - Clam with Resistance  - 1 x daily - 5 x weekly - 2 sets - 10 reps - with yellow resistance band around thighs  - Heel Toe Raises with Counter Support  - 1-2 x daily - 5 x weekly - 2 sets - 10 reps - Standing Single Leg Stance with Counter Support  - 1-2 x daily - 5 x weekly - 3 sets - 10-15 hold - needing  to use intermittent UE support  Also was going to add standing marching for hip flexion strength, but pt reports already doing this at home.   PATIENT EDUCATION: Education details: Results of ABC, initial HEP for balance and strength, bringing in rollator to session vs. No AD due to fall risk  Person educated: Patient Education method: Explanation, Demonstration, Verbal cues, and Handouts Education comprehension: verbalized understanding and returned demonstration  HOME EXERCISE PROGRAM: Access Code: [email or web address] URL: [email or web address] Date: 06/15/2023 Prepared by: Sherlie Ban  Exercises - Sit to Stand with Armchair  - 2 x daily - 5 x weekly - 2 sets - 5 reps - Clam with Resistance  - 1 x daily - 5 x weekly - 2 sets - 10 reps - Heel Toe Raises with Counter Support  - 1-2 x daily - 5 x weekly - 2 sets - 10 reps - Standing Single Leg Stance with Counter Support  - 1-2 x daily - 5 x weekly - 3 sets - 10-15 hold  GOALS: Goals reviewed with patient? Yes  SHORT  TERM GOALS: Target date: 07/10/23  Pt will be independent with initial HEP for improved balance and decreased fear of falling.  Baseline: to be given  Goal status: INITIAL  2.  ABC goal Baseline: 36.25% confidence, LTG written  Goal status: MET  3.  Pt will improve 5x STS to </= 25 sec to demo improved functional LE strength and transfers.  Baseline: 28.25 sec Goal status: INITIAL  4.  Pt will improve Berg score to 42 to demonstrate improved balance with standing and weight shifting and decreased fall risk.  Baseline: 36 Goal status: INITIAL   LONG TERM GOALS: Target date: 08/07/23  Pt will be independent with final HEP for improved balance and decreased fear of falling.  Baseline: to be given Goal status: INITIAL  2.   Pt will improve ABC scale goal to at least 45% in order to demo improved confidence.  Baseline: 36.25% confidence Goal status: INITIAL  3.  Pt will improve 5x STS to </= 22 sec to demo improved functional LE strength and transfers.  Baseline: 28.25 sec Goal status: INITIAL  4.  Pt will improve Berg score to 45 to demonstrate improved balance with standing and weight shifting and decreased fall risk.  Baseline: 36 Goal status: INITIAL  ASSESSMENT:  CLINICAL IMPRESSION:   Had pt fill out the ABC scale with pt scoring 36.25% confidence on items, indicating low confidence. LTG updated as appropriate. Remainder of session focused on initiating HEP for functional strength and balance. Pt needing cues for sit <> stands transfers, esp with scooting out to the edge and with making sure she performs with forward lean to get weight through legs instead of just trying to push through BUE. Will continue per POC.   OBJECTIVE IMPAIRMENTS: Abnormal gait, decreased balance, decreased coordination, decreased mobility, difficulty walking, and decreased strength.   ACTIVITY LIMITATIONS: carrying, lifting, bending, standing, stairs, locomotion level, and caring for  others  PARTICIPATION LIMITATIONS: meal prep, cleaning, laundry, shopping, and community activity  PERSONAL FACTORS: Age, Time since onset of injury/illness/exacerbation, and Transportation are also affecting patient's functional outcome.   REHAB POTENTIAL: Good  CLINICAL DECISION MAKING: Stable/uncomplicated  EVALUATION COMPLEXITY: Low  PLAN:  PT FREQUENCY: 1-2x/week  PT DURATION: 8 weeks  PLANNED INTERVENTIONS: 97164- PT Re-evaluation, 97110-Therapeutic exercises, 97530- Therapeutic activity, [id number]- Neuromuscular re-education, 97535- Self Care, 74259- Manual therapy, [phone]- Gait training, [phone]- Orthotic Fit/training, [phone]- Aquatic Therapy, Patient/Family education, Balance training, Stair training,  Dry Needling, Vestibular training, and DME instructions  PLAN FOR NEXT SESSION: add balance to HEP, work on strength, work on balance tasks with decr UE support   Sherlie Ban, PT, DPT 06/15/23 4:16 PM

## 2023-06-17 ENCOUNTER — Other Ambulatory Visit: Payer: Self-pay | Admitting: Internal Medicine

## 2023-06-17 ENCOUNTER — Ambulatory Visit: Payer: Medicare Other | Admitting: Physical Therapy

## 2023-06-17 ENCOUNTER — Encounter: Payer: Self-pay | Admitting: Physical Therapy

## 2023-06-17 DIAGNOSIS — R2681 Unsteadiness on feet: Secondary | ICD-10-CM | POA: Diagnosis not present

## 2023-06-17 DIAGNOSIS — R2689 Other abnormalities of gait and mobility: Secondary | ICD-10-CM | POA: Diagnosis not present

## 2023-06-17 DIAGNOSIS — R269 Unspecified abnormalities of gait and mobility: Secondary | ICD-10-CM | POA: Diagnosis not present

## 2023-06-17 NOTE — Therapy (Signed)
OUTPATIENT PHYSICAL THERAPY NEURO TREATMENT   Patient Name: [name] MRN: [id number] DOB:10/20/1955, 67 y.o., female Today's Date: 06/17/2023   PCP: Madelin Headings, MD  REFERRING PROVIDER: Curt Bears, MD   END OF SESSION:  PT End of Session - 06/17/23 1620     Visit Number 3    Number of Visits 17    Date for PT Re-Evaluation 08/07/23    Authorization Type [employer]    PT Start Time 1618    PT Stop Time 1658    PT Time Calculation (min) 40 min    Equipment Utilized During Treatment Gait belt    Activity Tolerance Patient tolerated treatment well    Behavior During Therapy WFL for tasks assessed/performed             Past Medical History:  Diagnosis Date   Ambulates with cane 04/08/2021   prn occ walker prn   Arthritis    left knee   Diverticulosis of colon    GERD (gastroesophageal reflux disease)    History of concussion 2019   no residual   Hypertension    Multiple sclerosis, relapsing-remitting (HCC) 2007   neurologist--- dr Theodosia Paling ([facility] [place]   Neurogenic bladder    Self-catheterizes urinary bladder 04/08/2021   self caths 3 to 4 x daily   Sjogren's syndrome (HCC) 10/23/2021   runs vaporizer in room @ night to help with   Torn ligament 04/01/2022   left knee   Urge incontinence    Uses walker 04/08/2021   prn   Varicose veins of both lower extremities    Past Surgical History:  Procedure Laterality Date   BOTOX INJECTION N/A 04/10/2021   Procedure: BOTOX INJECTION WITH CYSTOSCOPY;  Surgeon: Rene Paci, MD;  Location: [facility];  Service: Urology;  Laterality: N/A;   BOTOX INJECTION N/A 10/25/2021   Procedure: CYSTOSCOPY WITH BOTOX INJECTION 300 UNITS;  Surgeon: Rene Paci, MD;  Location: [facility];  Service: Urology;  Laterality: N/A;   BOTOX INJECTION N/A 04/09/2022   Procedure: BOTOX INJECTION 300 units WITH CYSTOSCOPY;   Surgeon: Rene Paci, MD;  Location: [facility];  Service: Urology;  Laterality: N/A;   BOTOX INJECTION N/A 10/15/2022   Procedure: BOTOX  300 UNITSINJECTION;  Surgeon: Rene Paci, MD;  Location: [facility];  Service: Urology;  Laterality: N/A;   CATARACT EXTRACTION W/ INTRAOCULAR LENS IMPLANT Left 2022   COLONOSCOPY WITH PROPOFOL  01/10/2015   dr d. Juanda Chance   CYSTOSCOPY WITH INJECTION N/A 10/15/2022   Procedure: CYSTOSCOPY WITH INJECTION;  Surgeon: Rene Paci, MD;  Location: [facility];  Service: Urology;  Laterality: N/A;  30 MINS   CYSTOSCOPY WITH INJECTION N/A 04/24/2023   Procedure: CYSTOSCOPY WITH BOTOX INJECTION 300 UNITS;  Surgeon: Rene Paci, MD;  Location: [facility];  Service: Urology;  Laterality: N/A;   SHOULDER ARTHROSCOPY WITH ROTATOR CUFF REPAIR AND SUBACROMIAL DECOMPRESSION  06/01/2012   Procedure: SHOULDER ARTHROSCOPY WITH ROTATOR CUFF REPAIR AND SUBACROMIAL DECOMPRESSION;  Surgeon: Drucilla Schmidt, MD;  Location: [facility];  Service: Orthopedics;  Laterality: Left;  LEFT SHOULDER ARTHROSCOPY WITH SUBACROMIAL DECOMPRESION AND DEBRIDEMENT OF PARTIAL ROTATOR CUFF TEAR   TONSILLECTOMY AND ADENOIDECTOMY  1962   TOTAL VAGINAL HYSTERECTOMY  12/01/2006   @[facility]  by dr m. Vincente Poli;   W/ ANTERIOR & POSTERIOR REPAIR AND SACROPINOUS LIGAMENT FIXATION OF VAGINAL VAULT  Patient Active Problem List   Diagnosis Date Noted   Positive ANA (antinuclear antibody) 11/28/2021   Discoloration of skin of toe 11/28/2021   Rectal bleeding 12/05/2016   Internal hemorrhoids 12/05/2016   Hx of falling 03/17/2016   Nausea alone 02/13/2014   Other malaise and fatigue 02/13/2014   Postmenopausal estrogen deficiency 02/24/2013   Visit for preventive health examination 02/24/2013   Family hx osteoporosis 02/21/2013   Recurrent UTI 01/09/2012   Suprapubic tenderness  12/01/2011   GERD (gastroesophageal reflux disease) 10/15/2011   Dysphagia, pharyngoesophageal 10/15/2011   Voiding dysfunction 09/24/2011   Hypertriglyceridemia 02/09/2011   Vitamin D deficiency 12/13/2008   Constipation 12/13/2008   MUSCLE SPASM 12/13/2008   DIZZINESS 12/13/2008   HYPOKALEMIA, HX OF 12/13/2008   DISORDER, ADJUSTMENT W/DEPRESSED MOOD 04/30/2007   SCLEROSIS, MULTIPLE 04/30/2007   Essential hypertension 04/30/2007   VARICOSE VEIN, LWR EXTREMITIES W/INFLAMMATION 04/30/2007   Allergic rhinitis 04/30/2007    ONSET DATE: 05/22/2023  REFERRING DIAG: G35 (ICD-10-CM) - Multiple sclerosis R26.89 (ICD-10-CM) - Other abnormalities of gait and mobility  Rationale for Evaluation and Treatment: Rehabilitation  SUBJECTIVE:                                                                                                                                                                                             SUBJECTIVE STATEMENT: Can't do the clamshells - they made her dizzy. Worked on the marching. Has a knot under her shoulder blade, and reports she is gonna have an injection under her shoulder blade.   Pt accompanied by: self  PERTINENT HISTORY: MS relapsing-remitting, arthritis, hypertension   PAIN:  Are you having pain? No Behind her shoulder is tender, has been moving around a lot today and it is not as bothersome.   PRECAUTIONS: Fall  RED FLAGS: None   WEIGHT BEARING RESTRICTIONS: No  FALLS: Has patient fallen in last 6 months?  Last fall was one year ago, many near falls in the last 6 months   LIVING ENVIRONMENT: Lives with: lives with their spouse Lives in: House/apartment Stairs: Yes: External: 4 steps; on right going up Has following equipment at home: Single point cane and Walker - 4 wheeled  PLOF: Independent with basic ADLs and Independent with household mobility with device  PATIENT GOALS: "stand on my own and not panic about falling"    OBJECTIVE:  Note: Objective measures were completed at Evaluation unless otherwise noted.  DIAGNOSTIC FINDINGS: 10/09/2022 MR Lumbar Spine  IMPRESSION: 1. Chronic L1 vertebral body compression fracture with mild marrow edema along the anterior superior corner and approximately 60% height loss. 2. No acute osseous  injury of the lumbar spine. 3. Mild lumbar spine spondylosis as described above.  COGNITION: Overall cognitive status: Within functional limits for tasks assessed   SENSATION: WFL   POSTURE: rounded shoulders, forward head, decreased lumbar lordosis, and increased thoracic kyphosis   LOWER EXTREMITY MMT:    MMT Right Eval Left Eval  Hip flexion 3 4  Hip extension    Hip abduction 3 3  Hip adduction 4 4  Hip internal rotation    Hip external rotation    Knee flexion 4 5  Knee extension 4 5  Ankle dorsiflexion 4 4  Ankle plantarflexion 4 4  Ankle inversion    Ankle eversion    (Blank rows = not tested)     TODAY'S TREATMENT:                                                                                                                               Therapeutic Exercise:  SciFit with BUE/BLE for strengthening, ROM, activity tolerance, multi-peaks at gear 4.0 for 5 minutes, pt reporting feeling it more in her arms than her legs.   Standing hip ABD 2 x 10 reps each side with BUE support, cues for proper technique and alignment. Replaced clamshells with this exercise as pt able to tolerate better  With BUE support: alternating forward step ups 10 reps each leg to 6" step, cues for hip/knee flexion when stepping foot up to step  Seated Hip ABD/flexion over yardstick with RLE 2 x 10 reps and 5# kettlebell with LLE (pt fatigued towards end of reps and had tendency to compensate by leaning backwards), pt very challenged by this exercise due to weakness  NMR: On air ex: EO with feet hip width, 10 reps head turns, 10 reps head nods, intermittent UE support for  balance  Alternating SLS taps to 4" step, 15 reps each leg, pt needing to use fingertip support for balance Wide BOS EC 3 x 30 seconds   Adjusted pt's rollator to proper height as was initially too high for pt  Pt reporting it feeling better for her shoulders at changed height   PATIENT EDUCATION: Education details: Gave pt standing hip ABD to HEP instead of clamshells as pt reporting incr dizziness when rolling  Person educated: Patient Education method: Programmer, multimedia, Demonstration, Verbal cues, and Handouts Education comprehension: verbalized understanding and returned demonstration  HOME EXERCISE PROGRAM: Access Code: [email or web address] URL: [email or web address] Date: 06/17/2023 Prepared by: Sherlie Ban  Exercises - Sit to Stand with Armchair  - 2 x daily - 5 x weekly - 2 sets - 5 reps - Heel Toe Raises with Counter Support  - 1-2 x daily - 5 x weekly - 2 sets - 10 reps - Standing Single Leg Stance with Counter Support  - 1-2 x daily - 5 x weekly - 3 sets - 10-15 hold - Standing March with Counter Support  - 1-2 x daily - 5 x weekly - 1-2 sets -  10 reps - Standing Hip Abduction with Counter Support  - 1-2 x daily - 5 x weekly - 2 sets - 10 reps  GOALS: Goals reviewed with patient? Yes  SHORT TERM GOALS: Target date: 07/10/23  Pt will be independent with initial HEP for improved balance and decreased fear of falling.  Baseline: to be given  Goal status: INITIAL  2.  ABC goal Baseline: 36.25% confidence, LTG written  Goal status: MET  3.  Pt will improve 5x STS to </= 25 sec to demo improved functional LE strength and transfers.  Baseline: 28.25 sec Goal status: INITIAL  4.  Pt will improve Berg score to 42 to demonstrate improved balance with standing and weight shifting and decreased fall risk.  Baseline: 36 Goal status: INITIAL   LONG TERM GOALS: Target date: 08/07/23  Pt will be independent with final HEP for improved balance and decreased fear of  falling.  Baseline: to be given Goal status: INITIAL  2.   Pt will improve ABC scale goal to at least 45% in order to demo improved confidence.  Baseline: 36.25% confidence Goal status: INITIAL  3.  Pt will improve 5x STS to </= 22 sec to demo improved functional LE strength and transfers.  Baseline: 28.25 sec Goal status: INITIAL  4.  Pt will improve Berg score to 45 to demonstrate improved balance with standing and weight shifting and decreased fall risk.  Baseline: 36 Goal status: INITIAL  ASSESSMENT:  CLINICAL IMPRESSION:   Changed hip ABD exercise for HEP to standing hip ABD as pt reporting that clamshells made her too dizzy for her to perform them at home. Remainder of session focused on BLE strengthening and standing balance tasks on air ex with trying to decr UE support. Pt very challenged by seated hip ABD/hip flexion tasks with RLE, when having to step over the yardstick as an obstacle due to weakness. Will continue per POC.   OBJECTIVE IMPAIRMENTS: Abnormal gait, decreased balance, decreased coordination, decreased mobility, difficulty walking, and decreased strength.   ACTIVITY LIMITATIONS: carrying, lifting, bending, standing, stairs, locomotion level, and caring for others  PARTICIPATION LIMITATIONS: meal prep, cleaning, laundry, shopping, and community activity  PERSONAL FACTORS: Age, Time since onset of injury/illness/exacerbation, and Transportation are also affecting patient's functional outcome.   REHAB POTENTIAL: Good  CLINICAL DECISION MAKING: Stable/uncomplicated  EVALUATION COMPLEXITY: Low  PLAN:  PT FREQUENCY: 1-2x/week  PT DURATION: 8 weeks  PLANNED INTERVENTIONS: 97164- PT Re-evaluation, 97110-Therapeutic exercises, 97530- Therapeutic activity, 97112- Neuromuscular re-education, 97535- Self Care, 56387- Manual therapy, [phone]- Gait training, [phone]- Orthotic Fit/training, [phone]- Aquatic Therapy, Patient/Family education, Balance training, Stair  training, Dry Needling, Vestibular training, and DME instructions  PLAN FOR NEXT SESSION: work on strength, work on Development worker, international aid tasks with decr UE support, sit <> stands, hip flexor/hip ABD strengthening   Sherlie Ban, PT, DPT 06/17/23 5:07 PM

## 2023-06-22 ENCOUNTER — Ambulatory Visit: Payer: Medicare Other | Admitting: Physical Therapy

## 2023-06-22 DIAGNOSIS — R269 Unspecified abnormalities of gait and mobility: Secondary | ICD-10-CM

## 2023-06-22 DIAGNOSIS — R2689 Other abnormalities of gait and mobility: Secondary | ICD-10-CM | POA: Diagnosis not present

## 2023-06-22 DIAGNOSIS — F40298 Other specified phobia: Secondary | ICD-10-CM

## 2023-06-22 DIAGNOSIS — R2681 Unsteadiness on feet: Secondary | ICD-10-CM | POA: Diagnosis not present

## 2023-06-22 NOTE — Therapy (Cosign Needed)
OUTPATIENT PHYSICAL THERAPY NEURO TREATMENT   Patient Name: [name] MRN: [id number] DOB:[birth year], 67 y.o., female Today's Date: 06/22/2023   PCP: Madelin Headings, MD  REFERRING PROVIDER: Curt Bears, MD   END OF SESSION:  PT End of Session - 06/22/23 1617     Visit Number 4    Number of Visits 17    Date for PT Re-Evaluation 08/07/23    Authorization Type [employer] Medicare    PT Start Time 1617    PT Stop Time 1658    PT Time Calculation (min) 41 min    Equipment Utilized During Treatment Gait belt    Activity Tolerance Patient tolerated treatment well    Behavior During Therapy WFL for tasks assessed/performed              Past Medical History:  Diagnosis Date   Ambulates with cane 04/08/2021   prn occ walker prn   Arthritis    left knee   Diverticulosis of colon    GERD (gastroesophageal reflux disease)    History of concussion 2019   no residual   Hypertension    Multiple sclerosis, relapsing-remitting (HCC) 2007   neurologist--- dr Theodosia Paling ([facility]-- [place])   Neurogenic bladder    Self-catheterizes urinary bladder 04/08/2021   self caths 3 to 4 x daily   Sjogren's syndrome (HCC) 10/23/2021   runs vaporizer in room @ night to help with   Torn ligament 04/01/2022   left knee   Urge incontinence    Uses walker 04/08/2021   prn   Varicose veins of both lower extremities    Past Surgical History:  Procedure Laterality Date   BOTOX INJECTION N/A 04/10/2021   Procedure: BOTOX INJECTION WITH CYSTOSCOPY;  Surgeon: Rene Paci, MD;  Location: [facility];  Service: Urology;  Laterality: N/A;   BOTOX INJECTION N/A 10/25/2021   Procedure: CYSTOSCOPY WITH BOTOX INJECTION 300 UNITS;  Surgeon: Rene Paci, MD;  Location: [facility];  Service: Urology;  Laterality: N/A;   BOTOX INJECTION N/A 04/09/2022   Procedure: BOTOX INJECTION 300 units WITH  CYSTOSCOPY;  Surgeon: Rene Paci, MD;  Location: [facility];  Service: Urology;  Laterality: N/A;   BOTOX INJECTION N/A 10/15/2022   Procedure: BOTOX  300 UNITSINJECTION;  Surgeon: Rene Paci, MD;  Location: [facility];  Service: Urology;  Laterality: N/A;   CATARACT EXTRACTION W/ INTRAOCULAR LENS IMPLANT Left 2022   COLONOSCOPY WITH PROPOFOL  01/10/2015   dr d. Juanda Chance   CYSTOSCOPY WITH INJECTION N/A 10/15/2022   Procedure: CYSTOSCOPY WITH INJECTION;  Surgeon: Rene Paci, MD;  Location: [facility];  Service: Urology;  Laterality: N/A;  30 MINS   CYSTOSCOPY WITH INJECTION N/A 04/24/2023   Procedure: CYSTOSCOPY WITH BOTOX INJECTION 300 UNITS;  Surgeon: Rene Paci, MD;  Location: [facility];  Service: Urology;  Laterality: N/A;   SHOULDER ARTHROSCOPY WITH ROTATOR CUFF REPAIR AND SUBACROMIAL DECOMPRESSION  06/01/2012   Procedure: SHOULDER ARTHROSCOPY WITH ROTATOR CUFF REPAIR AND SUBACROMIAL DECOMPRESSION;  Surgeon: Drucilla Schmidt, MD;  Location: [facility];  Service: Orthopedics;  Laterality: Left;  LEFT SHOULDER ARTHROSCOPY WITH SUBACROMIAL DECOMPRESION AND DEBRIDEMENT OF PARTIAL ROTATOR CUFF TEAR   TONSILLECTOMY AND ADENOIDECTOMY  1962   TOTAL VAGINAL HYSTERECTOMY  12/01/2006   @[facility]  by dr m. Vincente Poli;   W/ ANTERIOR & POSTERIOR REPAIR AND SACROPINOUS LIGAMENT FIXATION OF VAGINAL VAULT  Patient Active Problem List   Diagnosis Date Noted   Positive ANA (antinuclear antibody) 11/28/2021   Discoloration of skin of toe 11/28/2021   Rectal bleeding 12/05/2016   Internal hemorrhoids 12/05/2016   Hx of falling 03/17/2016   Nausea alone 02/13/2014   Other malaise and fatigue 02/13/2014   Postmenopausal estrogen deficiency 02/24/2013   Visit for preventive health examination 02/24/2013   Family hx osteoporosis 02/21/2013   Recurrent UTI 01/09/2012   Suprapubic  tenderness 12/01/2011   GERD (gastroesophageal reflux disease) 10/15/2011   Dysphagia, pharyngoesophageal 10/15/2011   Voiding dysfunction 09/24/2011   Hypertriglyceridemia 02/09/2011   Vitamin D deficiency 12/13/2008   Constipation 12/13/2008   MUSCLE SPASM 12/13/2008   DIZZINESS 12/13/2008   HYPOKALEMIA, HX OF 12/13/2008   DISORDER, ADJUSTMENT W/DEPRESSED MOOD 04/30/2007   SCLEROSIS, MULTIPLE 04/30/2007   Essential hypertension 04/30/2007   VARICOSE VEIN, LWR EXTREMITIES W/INFLAMMATION 04/30/2007   Allergic rhinitis 04/30/2007    ONSET DATE: 05/22/2023  REFERRING DIAG: G35 (ICD-10-CM) - Multiple sclerosis R26.89 (ICD-10-CM) - Other abnormalities of gait and mobility  THERAPY DIAG:  Abnormality of gait  Unsteady  Abnormality of gait due to impairment of balance  Impairment of balance  Fear of falling   Rationale for Evaluation and Treatment: Rehabilitation  SUBJECTIVE:                                                                                                                                                                                             SUBJECTIVE STATEMENT:  "Chloe lowered the handles on my walker last time, it is a whole new experience, my shoulders are so much more relaxed." Denies falls or any other acute changes. Reports HEP is going well.   Pt accompanied by: self  PERTINENT HISTORY: MS relapsing-remitting, arthritis, hypertension   PAIN:  Are you having pain? No  PRECAUTIONS: Fall  RED FLAGS: None   WEIGHT BEARING RESTRICTIONS: No  FALLS: Has patient fallen in last 6 months?  Last fall was one year ago, many near falls in the last 6 months   LIVING ENVIRONMENT: Lives with: lives with their spouse Lives in: House/apartment Stairs: Yes: External: 4 steps; on right going up Has following equipment at home: Single point cane and Walker - 4 wheeled  PLOF: Independent with basic ADLs and Independent with household mobility with  device  PATIENT GOALS: "stand on my own and not panic about falling"   OBJECTIVE:  Note: Objective measures were completed at Evaluation unless otherwise noted.  DIAGNOSTIC FINDINGS: 10/09/2022 MR Lumbar Spine  IMPRESSION: 1. Chronic L1 vertebral body compression fracture with mild marrow edema along the anterior  superior corner and approximately 60% height loss. 2. No acute osseous injury of the lumbar spine. 3. Mild lumbar spine spondylosis as described above.  COGNITION: Overall cognitive status: Within functional limits for tasks assessed   SENSATION: WFL   POSTURE: rounded shoulders, forward head, decreased lumbar lordosis, and increased thoracic kyphosis   LOWER EXTREMITY MMT:    MMT Right Eval Left Eval  Hip flexion 3 4  Hip extension    Hip abduction 3 3  Hip adduction 4 4  Hip internal rotation    Hip external rotation    Knee flexion 4 5  Knee extension 4 5  Ankle dorsiflexion 4 4  Ankle plantarflexion 4 4  Ankle inversion    Ankle eversion    (Blank rows = not tested)     TODAY'S TREATMENT:                                                                                                                              Therapeutic Exercise:  Sit to stand from mat table w/ red theraband around thighs, cued to keep knees about 1 fist's worth apart 2 sets x 5 repetitions, CGA throughout for safety Pt reports feeling muscle burn in quads, no pain  Standing closed chain hip abduction w/ ipsilateral swissball roll out on mat table w/ red theraband around thighs 2 sets x 8 repetitions, performed bilaterally Second set, instructed to lift contralateral limb leading w/ heel  Cone march overs w/ tap at top 2 sets x 10 repetitions  "They're hard on both sides"  Countertop BUE support alternating hip extension taps x10 repetitions, performed bilaterally Pt reports good stretch in front of hip  NMR Forwards ambulation eyes closed x4 laps x14 ft  Backwards  ambulation eyes open x4 laps x14 ft Pt reports this is hard  Appropriate exercises added to HEP, see bolded below  PATIENT EDUCATION:  Education details: POC, HEP additions Person educated: Patient Education method: Explanation, Demonstration, Tactile cues, Verbal cues, and Handouts Education comprehension: verbalized understanding, returned demonstration, verbal cues required, tactile cues required, and needs further education  HOME EXERCISE PROGRAM:  Access Code: [email or web address] URL: [email or web address] Date: 06/17/2023 Prepared by: Sherlie Ban  Exercises - Sit to Stand with Armchair  - 2 x daily - 5 x weekly - 2 sets - 5 reps - Heel Toe Raises with Counter Support  - 1-2 x daily - 5 x weekly - 2 sets - 10 reps - Standing Single Leg Stance with Counter Support  - 1-2 x daily - 5 x weekly - 3 sets - 10-15 hold - Standing March with Counter Support  - 1-2 x daily - 5 x weekly - 1-2 sets - 10 reps - Standing Hip Abduction with Counter Support  - 1-2 x daily - 5 x weekly - 2 sets - 10 reps - Standing Hip Extension with Counter Support  - 1 x daily - 7 x weekly - 3 sets -  10 reps - Side Stepping with Counter Support  - 1 x daily - 7 x weekly - 3 sets - 10 reps - Backward Walking with Counter Support  - 1 x daily - 7 x weekly - 3 sets - 10 reps  GOALS: Goals reviewed with patient? Yes  SHORT TERM GOALS: Target date: 07/10/23  Pt will be independent with initial HEP for improved balance and decreased fear of falling.  Baseline: to be given  Goal status: INITIAL  2.  ABC goal Baseline: 36.25% confidence, LTG written  Goal status: MET  3.  Pt will improve 5x STS to </= 25 sec to demo improved functional LE strength and transfers.  Baseline: 28.25 sec Goal status: INITIAL  4.  Pt will improve Berg score to 42 to demonstrate improved balance with standing and weight shifting and decreased fall risk.  Baseline: 36 Goal status: INITIAL   LONG TERM GOALS: Target  date: 08/07/23  Pt will be independent with final HEP for improved balance and decreased fear of falling.  Baseline: to be given Goal status: INITIAL  2.   Pt will improve ABC scale goal to at least 45% in order to demo improved confidence.  Baseline: 36.25% confidence Goal status: INITIAL  3.  Pt will improve 5x STS to </= 22 sec to demo improved functional LE strength and transfers.  Baseline: 28.25 sec Goal status: INITIAL  4.  Pt will improve Berg score to 45 to demonstrate improved balance with standing and weight shifting and decreased fall risk.  Baseline: 36 Goal status: INITIAL  ASSESSMENT:  CLINICAL IMPRESSION:  Emphasis of skilled PT session on hip flexion and abduction strengthening as well as dynamic balance challenges. Continue POC.   OBJECTIVE IMPAIRMENTS: Abnormal gait, decreased balance, decreased coordination, decreased mobility, difficulty walking, and decreased strength.   ACTIVITY LIMITATIONS: carrying, lifting, bending, standing, stairs, locomotion level, and caring for others  PARTICIPATION LIMITATIONS: meal prep, cleaning, laundry, shopping, and community activity  PERSONAL FACTORS: Age, Time since onset of injury/illness/exacerbation, and Transportation are also affecting patient's functional outcome.   REHAB POTENTIAL: Good  CLINICAL DECISION MAKING: Stable/uncomplicated  EVALUATION COMPLEXITY: Low  PLAN:  PT FREQUENCY: 1-2x/week  PT DURATION: 8 weeks  PLANNED INTERVENTIONS: 97164- PT Re-evaluation, 97110-Therapeutic exercises, 97530- Therapeutic activity, 97112- Neuromuscular re-education, 97535- Self Care, 09811- Manual therapy, [phone]- Gait training, [phone]- Orthotic Fit/training, [phone]- Aquatic Therapy, Patient/Family education, Balance training, Stair training, Dry Needling, Vestibular training, and DME instructions  PLAN FOR NEXT SESSION: work on strength, work on Development worker, international aid tasks with decr UE support, sit <> stands, hip flexor/hip ABD  strengthening   Beverely Low, SPT  06/22/23 5:19 PM

## 2023-06-23 DIAGNOSIS — Z79899 Other long term (current) drug therapy: Secondary | ICD-10-CM | POA: Diagnosis not present

## 2023-06-23 DIAGNOSIS — M7918 Myalgia, other site: Secondary | ICD-10-CM | POA: Diagnosis not present

## 2023-06-23 DIAGNOSIS — G35 Multiple sclerosis: Secondary | ICD-10-CM | POA: Diagnosis not present

## 2023-06-24 ENCOUNTER — Ambulatory Visit: Payer: Medicare Other | Admitting: Physical Therapy

## 2023-06-29 ENCOUNTER — Ambulatory Visit: Payer: Medicare Other | Attending: Internal Medicine | Admitting: Physical Therapy

## 2023-06-29 DIAGNOSIS — R269 Unspecified abnormalities of gait and mobility: Secondary | ICD-10-CM | POA: Insufficient documentation

## 2023-06-29 DIAGNOSIS — M6281 Muscle weakness (generalized): Secondary | ICD-10-CM | POA: Diagnosis not present

## 2023-06-29 DIAGNOSIS — R2681 Unsteadiness on feet: Secondary | ICD-10-CM | POA: Insufficient documentation

## 2023-06-29 DIAGNOSIS — R2689 Other abnormalities of gait and mobility: Secondary | ICD-10-CM | POA: Diagnosis not present

## 2023-06-29 DIAGNOSIS — R42 Dizziness and giddiness: Secondary | ICD-10-CM | POA: Insufficient documentation

## 2023-06-29 DIAGNOSIS — F40298 Other specified phobia: Secondary | ICD-10-CM | POA: Diagnosis present

## 2023-06-29 DIAGNOSIS — H8112 Benign paroxysmal vertigo, left ear: Secondary | ICD-10-CM | POA: Insufficient documentation

## 2023-06-29 NOTE — Therapy (Signed)
OUTPATIENT PHYSICAL THERAPY NEURO TREATMENT   Patient Name: [name] MRN: [id number] DOB:1955/12/16, 67 y.o., female Today's Date: 06/29/2023   PCP: Madelin Headings, MD  REFERRING PROVIDER: Curt Bears, MD   END OF SESSION:  PT End of Session - 06/29/23 1531     Visit Number 5    Number of Visits 17    Date for PT Re-Evaluation 08/07/23    Authorization Type [employer]    PT Start Time 1531    PT Stop Time 1613    PT Time Calculation (min) 42 min    Equipment Utilized During Treatment Gait belt    Activity Tolerance Patient tolerated treatment well    Behavior During Therapy WFL for tasks assessed/performed               Past Medical History:  Diagnosis Date   Ambulates with cane 04/08/2021   prn occ walker prn   Arthritis    left knee   Diverticulosis of colon    GERD (gastroesophageal reflux disease)    History of concussion 2019   no residual   Hypertension    Multiple sclerosis, relapsing-remitting (HCC) 2007   neurologist--- dr Theodosia Paling ([facility] [place])   Neurogenic bladder    Self-catheterizes urinary bladder 04/08/2021   self caths 3 to 4 x daily   Sjogren's syndrome (HCC) 10/23/2021   runs vaporizer in room @ night to help with   Torn ligament 04/01/2022   left knee   Urge incontinence    Uses walker 04/08/2021   prn   Varicose veins of both lower extremities    Past Surgical History:  Procedure Laterality Date   BOTOX INJECTION N/A 04/10/2021   Procedure: BOTOX INJECTION WITH CYSTOSCOPY;  Surgeon: Rene Paci, MD;  Location: [facility];  Service: Urology;  Laterality: N/A;   BOTOX INJECTION N/A 10/25/2021   Procedure: CYSTOSCOPY WITH BOTOX INJECTION 300 UNITS;  Surgeon: Rene Paci, MD;  Location: [facility];  Service: Urology;  Laterality: N/A;   BOTOX INJECTION N/A 04/09/2022   Procedure: BOTOX INJECTION 300 units WITH  CYSTOSCOPY;  Surgeon: Rene Paci, MD;  Location: [facility];  Service: Urology;  Laterality: N/A;   BOTOX INJECTION N/A 10/15/2022   Procedure: BOTOX  300 UNITSINJECTION;  Surgeon: Rene Paci, MD;  Location: [facility];  Service: Urology;  Laterality: N/A;   CATARACT EXTRACTION W/ INTRAOCULAR LENS IMPLANT Left 2022   COLONOSCOPY WITH PROPOFOL  01/10/2015   dr d. Juanda Chance   CYSTOSCOPY WITH INJECTION N/A 10/15/2022   Procedure: CYSTOSCOPY WITH INJECTION;  Surgeon: Rene Paci, MD;  Location: [facility];  Service: Urology;  Laterality: N/A;  30 MINS   CYSTOSCOPY WITH INJECTION N/A 04/24/2023   Procedure: CYSTOSCOPY WITH BOTOX INJECTION 300 UNITS;  Surgeon: Rene Paci, MD;  Location: [facility];  Service: Urology;  Laterality: N/A;   SHOULDER ARTHROSCOPY WITH ROTATOR CUFF REPAIR AND SUBACROMIAL DECOMPRESSION  06/01/2012   Procedure: SHOULDER ARTHROSCOPY WITH ROTATOR CUFF REPAIR AND SUBACROMIAL DECOMPRESSION;  Surgeon: Drucilla Schmidt, MD;  Location: [facility];  Service: Orthopedics;  Laterality: Left;  LEFT SHOULDER ARTHROSCOPY WITH SUBACROMIAL DECOMPRESION AND DEBRIDEMENT OF PARTIAL ROTATOR CUFF TEAR   TONSILLECTOMY AND ADENOIDECTOMY  1962   TOTAL VAGINAL HYSTERECTOMY  12/01/2006   @[facility]  by dr m. grewal;   W/ ANTERIOR & POSTERIOR REPAIR AND SACROPINOUS LIGAMENT FIXATION OF VAGINAL  VAULT   Patient Active Problem List   Diagnosis Date Noted   Positive ANA (antinuclear antibody) 11/28/2021   Discoloration of skin of toe 11/28/2021   Rectal bleeding 12/05/2016   Internal hemorrhoids 12/05/2016   Hx of falling 03/17/2016   Nausea alone 02/13/2014   Other malaise and fatigue 02/13/2014   Postmenopausal estrogen deficiency 02/24/2013   Visit for preventive health examination 02/24/2013   Family hx osteoporosis 02/21/2013   Recurrent UTI 01/09/2012   Suprapubic  tenderness 12/01/2011   GERD (gastroesophageal reflux disease) 10/15/2011   Dysphagia, pharyngoesophageal 10/15/2011   Voiding dysfunction 09/24/2011   Hypertriglyceridemia 02/09/2011   Vitamin D deficiency 12/13/2008   Constipation 12/13/2008   MUSCLE SPASM 12/13/2008   DIZZINESS 12/13/2008   HYPOKALEMIA, HX OF 12/13/2008   DISORDER, ADJUSTMENT W/DEPRESSED MOOD 04/30/2007   SCLEROSIS, MULTIPLE 04/30/2007   Essential hypertension 04/30/2007   VARICOSE VEIN, LWR EXTREMITIES W/INFLAMMATION 04/30/2007   Allergic rhinitis 04/30/2007    ONSET DATE: 05/22/2023  REFERRING DIAG: G35 (ICD-10-CM) - Multiple sclerosis R26.89 (ICD-10-CM) - Other abnormalities of gait and mobility  THERAPY DIAG:  Abnormality of gait  Unsteady  Abnormality of gait due to impairment of balance  Impairment of balance  Fear of falling   Rationale for Evaluation and Treatment: Rehabilitation  SUBJECTIVE:                                                                                                                                                                                             SUBJECTIVE STATEMENT:  "I got a flu shot on Saturday and I've had a headache since then". Had an injection in her back last Tuesday "It has made such a difference"- pt notes improved posture and balance. Denies falls or acute changes. Reports HEP is going good.   Pt accompanied by: self  PERTINENT HISTORY: MS relapsing-remitting, arthritis, hypertension   PAIN:  Are you having pain? No  PRECAUTIONS: Fall  RED FLAGS: None   WEIGHT BEARING RESTRICTIONS: No  FALLS: Has patient fallen in last 6 months?  Last fall was one year ago, many near falls in the last 6 months   LIVING ENVIRONMENT: Lives with: lives with their spouse Lives in: House/apartment Stairs: Yes: External: 4 steps; on right going up Has following equipment at home: Single point cane and Walker - 4 wheeled  PLOF: Independent with basic ADLs  and Independent with household mobility with device  PATIENT GOALS: "stand on my own and not panic about falling"   OBJECTIVE:  Note: Objective measures were completed at Evaluation unless otherwise noted.  DIAGNOSTIC FINDINGS: 10/09/2022 MR Lumbar Spine  IMPRESSION: 1.  Chronic L1 vertebral body compression fracture with mild marrow edema along the anterior superior corner and approximately 60% height loss. 2. No acute osseous injury of the lumbar spine. 3. Mild lumbar spine spondylosis as described above.  COGNITION: Overall cognitive status: Within functional limits for tasks assessed   SENSATION: WFL   POSTURE: rounded shoulders, forward head, decreased lumbar lordosis, and increased thoracic kyphosis   LOWER EXTREMITY MMT:    MMT Right Eval Left Eval  Hip flexion 3 4  Hip extension    Hip abduction 3 3  Hip adduction 4 4  Hip internal rotation    Hip external rotation    Knee flexion 4 5  Knee extension 4 5  Ankle dorsiflexion 4 4  Ankle plantarflexion 4 4  Ankle inversion    Ankle eversion    (Blank rows = not tested)     TODAY'S TREATMENT:                                                                                                                              TherAct Resisted sit to stands against green band resistance x8 repetitions, w/ BUE to boost to stand, CGA Elevated mat table and added 6# medicine ball held anteriorly for resisted sit to stands against green band resistance x8 repetitions, Min A to boost to stand and CGA in standing  TherEx Monster walks in parallel bars w/ yellow theraband resistance at ankles, close SBA>CGA as needed for safety X3 laps forwards X3 laps backwards X3 laps laterally, each LE leading Pt reports feeling good at completion, having increased difficulty with keeping toes forward at end of lateral stepping Added to HEP, see bolded below  4# medicine ball Lift/chop in standing 2 sets x 8 repetitions on each side,  CGA Pt reports liking this activity, believes it will help her with moving bedding off over her in the bed  PATIENT EDUCATION:  Education details: POC, HEP additions Person educated: Patient Education method: Explanation, Demonstration, Tactile cues, Verbal cues, and Handouts Education comprehension: verbalized understanding, returned demonstration, verbal cues required, tactile cues required, and needs further education  HOME EXERCISE PROGRAM:  Access Code: [email or web address] URL: [email or web address] Date: 06/17/2023 Prepared by: Sherlie Ban  Exercises - Sit to Stand with Armchair  - 2 x daily - 5 x weekly - 2 sets - 5 reps - Heel Toe Raises with Counter Support  - 1-2 x daily - 5 x weekly - 2 sets - 10 reps - Standing Single Leg Stance with Counter Support  - 1-2 x daily - 5 x weekly - 3 sets - 10-15 hold - Standing March with Counter Support  - 1-2 x daily - 5 x weekly - 1-2 sets - 10 reps - Standing Hip Abduction with Counter Support  - 1-2 x daily - 5 x weekly - 2 sets - 10 reps - Standing Hip Extension with Counter Support  - 1 x daily - 7 x weekly -  3 sets - 10 reps - Side Stepping with Counter Support  - 1 x daily - 7 x weekly - 3 sets - 10 reps - Backward Walking with Counter Support  - 1 x daily - 7 x weekly - 3 sets - 10 reps - Side Stepping with Resistance at Ankles and Counter Support  - 1 x daily - 7 x weekly - 1 sets - 3 reps - Forward and Backward Monster Walk with Resistance at Ankles and Counter Support  - 1 x daily - 7 x weekly - 1 sets - 3 reps  GOALS: Goals reviewed with patient? Yes  SHORT TERM GOALS: Target date: 07/10/23  Pt will be independent with initial HEP for improved balance and decreased fear of falling.  Baseline: to be given  Goal status: INITIAL  2.  ABC goal Baseline: 36.25% confidence, LTG written  Goal status: MET  3.  Pt will improve 5x STS to </= 25 sec to demo improved functional LE strength and transfers.  Baseline: 28.25  sec Goal status: INITIAL  4.  Pt will improve Berg score to 42 to demonstrate improved balance with standing and weight shifting and decreased fall risk.  Baseline: 36 Goal status: INITIAL   LONG TERM GOALS: Target date: 08/07/23  Pt will be independent with final HEP for improved balance and decreased fear of falling.  Baseline: to be given Goal status: INITIAL  2.   Pt will improve ABC scale goal to at least 45% in order to demo improved confidence.  Baseline: 36.25% confidence Goal status: INITIAL  3.  Pt will improve 5x STS to </= 22 sec to demo improved functional LE strength and transfers.  Baseline: 28.25 sec Goal status: INITIAL  4.  Pt will improve Berg score to 45 to demonstrate improved balance with standing and weight shifting and decreased fall risk.  Baseline: 36 Goal status: INITIAL  ASSESSMENT:  CLINICAL IMPRESSION:  Emphasis of skilled PT session on sit to stand challenges, hip abduction strengthening, and core/UE strengthening. Pt tolerated treatment well, w/ most difficulty not using BUEs to stand from lower mat table, requiring elevated mat table and Min A to stand when holding 6# medicine ball. Continue POC.   OBJECTIVE IMPAIRMENTS: Abnormal gait, decreased balance, decreased coordination, decreased mobility, difficulty walking, and decreased strength.   ACTIVITY LIMITATIONS: carrying, lifting, bending, standing, stairs, locomotion level, and caring for others  PARTICIPATION LIMITATIONS: meal prep, cleaning, laundry, shopping, and community activity  PERSONAL FACTORS: Age, Time since onset of injury/illness/exacerbation, and Transportation are also affecting patient's functional outcome.   REHAB POTENTIAL: Good  CLINICAL DECISION MAKING: Stable/uncomplicated  EVALUATION COMPLEXITY: Low  PLAN:  PT FREQUENCY: 1-2x/week  PT DURATION: 8 weeks  PLANNED INTERVENTIONS: 97164- PT Re-evaluation, 97110-Therapeutic exercises, 97530- Therapeutic activity,  97112- Neuromuscular re-education, 97535- Self Care, 78469- Manual therapy, [phone]- Gait training, [phone]- Orthotic Fit/training, [phone]- Aquatic Therapy, Patient/Family education, Balance training, Stair training, Dry Needling, Vestibular training, and DME instructions  PLAN FOR NEXT SESSION: work on strength, work on Development worker, international aid tasks with decr UE support, sit <> stands, hip flexor/hip ABD strengthening, staggered sit to stands, core strengthening, incorporate UE tasks, step ups/laterally  Beverely Low, SPT  06/29/23 5:01 PM

## 2023-07-01 ENCOUNTER — Encounter: Payer: Self-pay | Admitting: Physical Therapy

## 2023-07-01 ENCOUNTER — Ambulatory Visit: Payer: Medicare Other | Admitting: Physical Therapy

## 2023-07-01 DIAGNOSIS — M6281 Muscle weakness (generalized): Secondary | ICD-10-CM

## 2023-07-01 DIAGNOSIS — R2681 Unsteadiness on feet: Secondary | ICD-10-CM

## 2023-07-01 DIAGNOSIS — R269 Unspecified abnormalities of gait and mobility: Secondary | ICD-10-CM

## 2023-07-01 DIAGNOSIS — R42 Dizziness and giddiness: Secondary | ICD-10-CM | POA: Diagnosis not present

## 2023-07-01 DIAGNOSIS — R2689 Other abnormalities of gait and mobility: Secondary | ICD-10-CM

## 2023-07-01 DIAGNOSIS — H8112 Benign paroxysmal vertigo, left ear: Secondary | ICD-10-CM

## 2023-07-01 NOTE — Therapy (Signed)
OUTPATIENT PHYSICAL THERAPY NEURO TREATMENT/RE-CERT   Patient Name: [name] MRN: [id number] DOB:[birth year], 67 y.o., female Today's Date: 07/02/2023   PCP: Madelin Headings, MD  REFERRING PROVIDER: Curt Bears, MD   END OF SESSION:  PT End of Session - 07/01/23 1619     Visit Number 6    Number of Visits 17    Date for PT Re-Evaluation 08/07/23    Authorization Type [employer]    PT Start Time 1617    PT Stop Time 1658    PT Time Calculation (min) 41 min    Equipment Utilized During Treatment --    Activity Tolerance Patient tolerated treatment well    Behavior During Therapy [facility] for tasks assessed/performed               Past Medical History:  Diagnosis Date   Ambulates with cane 04/08/2021   prn occ walker prn   Arthritis    left knee   Diverticulosis of colon    GERD (gastroesophageal reflux disease)    History of concussion 2019   no residual   Hypertension    Multiple sclerosis, relapsing-remitting (HCC) 2007   neurologist--- dr Theodosia Paling ([facility]-- [place]   Neurogenic bladder    Self-catheterizes urinary bladder 04/08/2021   self caths 3 to 4 x daily   Sjogren's syndrome (HCC) 10/23/2021   runs vaporizer in room @ night to help with   Torn ligament 04/01/2022   left knee   Urge incontinence    Uses walker 04/08/2021   prn   Varicose veins of both lower extremities    Past Surgical History:  Procedure Laterality Date   BOTOX INJECTION N/A 04/10/2021   Procedure: BOTOX INJECTION WITH CYSTOSCOPY;  Surgeon: Rene Paci, MD;  Location: [facility];  Service: Urology;  Laterality: N/A;   BOTOX INJECTION N/A 10/25/2021   Procedure: CYSTOSCOPY WITH BOTOX INJECTION 300 UNITS;  Surgeon: Rene Paci, MD;  Location[facility];  Service: Urology;  Laterality: N/A;   BOTOX INJECTION N/A 04/09/2022   Procedure: BOTOX INJECTION 300 units WITH  CYSTOSCOPY;  Surgeon: Rene Paci, MD;  Location: [facility];  Service: Urology;  Laterality: N/A;   BOTOX INJECTION N/A 10/15/2022   Procedure: BOTOX  300 UNITSINJECTION;  Surgeon: Rene Paci, MD;  Location: [facility];  Service: Urology;  Laterality: N/A;   CATARACT EXTRACTION W/ INTRAOCULAR LENS IMPLANT Left 2022   COLONOSCOPY WITH PROPOFOL  01/10/2015   dr d. Juanda Chance   CYSTOSCOPY WITH INJECTION N/A 10/15/2022   Procedure: CYSTOSCOPY WITH INJECTION;  Surgeon: Rene Paci, MD;  Location: [facility];  Service: Urology;  Laterality: N/A;  30 MINS   CYSTOSCOPY WITH INJECTION N/A 04/24/2023   Procedure: CYSTOSCOPY WITH BOTOX INJECTION 300 UNITS;  Surgeon: Rene Paci, MD;  Location: [facility];  Service: Urology;  Laterality: N/A;   SHOULDER ARTHROSCOPY WITH ROTATOR CUFF REPAIR AND SUBACROMIAL DECOMPRESSION  06/01/2012   Procedure: SHOULDER ARTHROSCOPY WITH ROTATOR CUFF REPAIR AND SUBACROMIAL DECOMPRESSION;  Surgeon: Drucilla Schmidt, MD;  Location: [facility];  Service: Orthopedics;  Laterality: Left;  LEFT SHOULDER ARTHROSCOPY WITH SUBACROMIAL DECOMPRESION AND DEBRIDEMENT OF PARTIAL ROTATOR CUFF TEAR   TONSILLECTOMY AND ADENOIDECTOMY  1962   TOTAL VAGINAL HYSTERECTOMY  12/01/2006   @[facility]  by dr m. grewal;   W/ ANTERIOR & POSTERIOR REPAIR AND SACROPINOUS LIGAMENT FIXATION OF VAGINAL VAULT  Patient Active Problem List   Diagnosis Date Noted   Positive ANA (antinuclear antibody) 11/28/2021   Discoloration of skin of toe 11/28/2021   Rectal bleeding 12/05/2016   Internal hemorrhoids 12/05/2016   Hx of falling 03/17/2016   Nausea alone 02/13/2014   Other malaise and fatigue 02/13/2014   Postmenopausal estrogen deficiency 02/24/2013   Visit for preventive health examination 02/24/2013   Family hx osteoporosis 02/21/2013   Recurrent UTI 01/09/2012   Suprapubic  tenderness 12/01/2011   GERD (gastroesophageal reflux disease) 10/15/2011   Dysphagia, pharyngoesophageal 10/15/2011   Voiding dysfunction 09/24/2011   Hypertriglyceridemia 02/09/2011   Vitamin D deficiency 12/13/2008   Constipation 12/13/2008   MUSCLE SPASM 12/13/2008   DIZZINESS 12/13/2008   HYPOKALEMIA, HX OF 12/13/2008   DISORDER, ADJUSTMENT W/DEPRESSED MOOD 04/30/2007   SCLEROSIS, MULTIPLE 04/30/2007   Essential hypertension 04/30/2007   VARICOSE VEIN, LWR EXTREMITIES W/INFLAMMATION 04/30/2007   Allergic rhinitis 04/30/2007    ONSET DATE: 05/22/2023  REFERRING DIAG: G35 (ICD-10-CM) - Multiple sclerosis R26.89 (ICD-10-CM) - Other abnormalities of gait and mobility  THERAPY DIAG:  Dizziness and giddiness  Unsteadiness on feet  Other abnormalities of gait and mobility  BPPV (benign paroxysmal positional vertigo), left  Muscle weakness (generalized)   Rationale for Evaluation and Treatment: Rehabilitation  SUBJECTIVE:                                                                                                                                                                                             SUBJECTIVE STATEMENT:  No falls, has been using his rollator less at home. Got a shot in her rhomboid and that has really helped the pain. Notes sometimes she doesn't know where she is and she doesn't know how to regain her balance. Wondering about her vision and has dizziness when she looks up or rolls in the bed. Doesn't do the things that make her dizzy. Notes balance is worse in the dark and will just freeze.   Pt accompanied by: self  PERTINENT HISTORY: MS relapsing-remitting, arthritis, hypertension   PAIN:  Are you having pain? No  PRECAUTIONS: Fall  RED FLAGS: None   WEIGHT BEARING RESTRICTIONS: No  FALLS: Has patient fallen in last 6 months?  Last fall was one year ago, many near falls in the last 6 months   LIVING ENVIRONMENT: Lives with: lives with  their spouse Lives in: House/apartment Stairs: Yes: External: 4 steps; on right going up Has following equipment at home: Single point cane and Walker - 4 wheeled  PLOF: Independent with basic ADLs and Independent with household mobility with device  PATIENT GOALS: "stand on  my own and not panic about falling"   OBJECTIVE:  Note: Objective measures were completed at Evaluation unless otherwise noted.  DIAGNOSTIC FINDINGS: 10/09/2022 MR Lumbar Spine  IMPRESSION: 1. Chronic L1 vertebral body compression fracture with mild marrow edema along the anterior superior corner and approximately 60% height loss. 2. No acute osseous injury of the lumbar spine. 3. Mild lumbar spine spondylosis as described above.  COGNITION: Overall cognitive status: Within functional limits for tasks assessed   SENSATION: WFL   POSTURE: rounded shoulders, forward head, decreased lumbar lordosis, and increased thoracic kyphosis   LOWER EXTREMITY MMT:    MMT Right Eval Left Eval  Hip flexion 3 4  Hip extension    Hip abduction 3 3  Hip adduction 4 4  Hip internal rotation    Hip external rotation    Knee flexion 4 5  Knee extension 4 5  Ankle dorsiflexion 4 4  Ankle plantarflexion 4 4  Ankle inversion    Ankle eversion    (Blank rows = not tested)     TODAY'S TREATMENT:         VESTIBULAR ASSESSMENT    SYMPTOM BEHAVIOR:   Subjective history: Pt reports dizziness has been going on for at least 2 years.    Type of dizziness:  Off balance    Frequency: When putting self in situations    Duration: <1 minute    Aggravating factors: Induced by position change: rolling to the right and rolling to the left and Induced by motion: looking up at the ceiling, doing floor transfers    Relieving factors:  Time, closing eyes    Progression of symptoms: unchanged   OCULOMOTOR EXAM:   Ocular Alignment: normal   Ocular ROM: No Limitations   Spontaneous Nystagmus: absent   Gaze-Induced Nystagmus:  absent   Smooth Pursuits: intact and pt reporting it felt weird looking down and to the L   Saccades: intact, extra eye movements, and ~3 saccadic beats going to the R, pt reports feeling like her took more effort going this direction      VESTIBULAR - OCULAR REFLEX:    Slow VOR: Normal   VOR Cancellation: Normal, felt like this would make her dizzy if she did it for a long period of time.    Head-Impulse Test: HIT Right: negative HIT Left: negative No dizziness afterwards     POSITIONAL TESTING: Right Sidelying: no nystagmus Left Sidelying: upbeating, left nystagmus and lasting about 10-12 seconds, pt initially needing to close her eyes    VESTIBULAR TREATMENT: Canalith Repositioning: Epley Left: Number of Reps: 2, Response to Treatment: symptoms improved, and Comment: Due to limited ROM, performed modified with head over pillow instead of mat table. 2nd PT needed to help with rolling. After 2nd rep of Epley maneuver, pt reports feeling less dizzy when sitting upright. Did not get to re-assess at end of session due to time constraints.    PATIENT EDUCATION:  Education details: Vestibular assessment findings, pt positive for L posterior canal BPPV and provided BPPV education and how it relates to balance/dizziness and purpose of Epley maneuver  Person educated: Patient Education method: Explanation, Demonstration, Tactile cues, Verbal cues, and Handouts Education comprehension: verbalized understanding, returned demonstration, verbal cues required, tactile cues required, and needs further education  HOME EXERCISE PROGRAM:  Access Code: [email or web address] URL: [email or web address] Date: 06/17/2023 Prepared by: Sherlie Ban  Exercises - Sit to Stand with Armchair  - 2 x daily - 5 x weekly - 2 sets -  5 reps - Heel Toe Raises with Counter Support  - 1-2 x daily - 5 x weekly - 2 sets - 10 reps - Standing Single Leg Stance with Counter Support  - 1-2 x daily - 5 x weekly - 3 sets  - 10-15 hold - Standing March with Counter Support  - 1-2 x daily - 5 x weekly - 1-2 sets - 10 reps - Standing Hip Abduction with Counter Support  - 1-2 x daily - 5 x weekly - 2 sets - 10 reps - Standing Hip Extension with Counter Support  - 1 x daily - 7 x weekly - 3 sets - 10 reps - Side Stepping with Counter Support  - 1 x daily - 7 x weekly - 3 sets - 10 reps - Backward Walking with Counter Support  - 1 x daily - 7 x weekly - 3 sets - 10 reps - Side Stepping with Resistance at Ankles and Counter Support  - 1 x daily - 7 x weekly - 1 sets - 3 reps - Forward and Backward Monster Walk with Resistance at Ankles and Counter Support  - 1 x daily - 7 x weekly - 1 sets - 3 reps  GOALS: Goals reviewed with patient? Yes  SHORT TERM GOALS: Target date: 07/10/23  Pt will be independent with initial HEP for improved balance and decreased fear of falling.  Baseline: to be given  Goal status: INITIAL  2.  ABC goal Baseline: 36.25% confidence, LTG written  Goal status: MET  3.  Pt will improve 5x STS to </= 25 sec to demo improved functional LE strength and transfers.  Baseline: 28.25 sec Goal status: INITIAL  4.  Pt will improve Berg score to 42 to demonstrate improved balance with standing and weight shifting and decreased fall risk.  Baseline: 36 Goal status: INITIAL   LONG TERM GOALS: Target date: 08/07/23  Pt will be independent with final HEP for improved balance and decreased fear of falling.  Baseline: to be given Goal status: INITIAL  2.   Pt will improve ABC scale goal to at least 45% in order to demo improved confidence.  Baseline: 36.25% confidence Goal status: INITIAL  3.  Pt will improve 5x STS to </= 22 sec to demo improved functional LE strength and transfers.  Baseline: 28.25 sec Goal status: INITIAL  4.  Pt will improve Berg score to 45 to demonstrate improved balance with standing and weight shifting and decreased fall risk.  Baseline: 36 Goal status:  INITIAL  5.  Pt will demo negative L posterior canal BPPV in order to demo decr dizziness with daily life.  Baseline: positive Goal status: INITIAL  ASSESSMENT:  CLINICAL IMPRESSION: Pt reporting having dizziness with looking up and rolling in bed, so performed further vestibular assessment. Pt demonstrating slow/incr saccades to the R and did have L upbeating rotary nystagmus in L Sidelying and L DixHallpike, indicating L posterior canalithiasis. Provided education on BPPV and how it can also affect pt's balance. Treated with 2 reps of the Epley maneuver (modified with pt laying back over a pillow), pt reporting improved dizziness after the 2nd rep, but did not get to re-assess at end of session due to time constraints. Will send re-cert to include vestibular treatment for BPPV and updated goal. Will continue per POC.   OBJECTIVE IMPAIRMENTS: Abnormal gait, decreased balance, decreased coordination, decreased mobility, difficulty walking, and decreased strength.   ACTIVITY LIMITATIONS: carrying, lifting, bending, standing, stairs, locomotion level, and caring  for others  PARTICIPATION LIMITATIONS: meal prep, cleaning, laundry, shopping, and community activity  PERSONAL FACTORS: Age, Time since onset of injury/illness/exacerbation, and Transportation are also affecting patient's functional outcome.   REHAB POTENTIAL: Good  CLINICAL DECISION MAKING: Stable/uncomplicated  EVALUATION COMPLEXITY: Low  PLAN:  PT FREQUENCY: 1-2x/week  PT DURATION: 8 weeks  PLANNED INTERVENTIONS: 97164- PT Re-evaluation, 97110-Therapeutic exercises, 97530- Therapeutic activity, 97112- Neuromuscular re-education, 97535- Self Care, 16109- Manual therapy, [phone]- Gait training, [phone]- Orthotic Fit/training, [phone]- Canalith repositioning, [phone]- Aquatic Therapy, Patient/Family education, Balance training, Stair training, Dry Needling, Vestibular training, and DME instructions  PLAN FOR NEXT SESSION: work on  strength, work on Development worker, international aid tasks with decr UE support, sit <> stands, hip flexor/hip ABD strengthening, staggered sit to stands, core strengthening, incorporate UE tasks, step ups/laterally  Check L posterior canal and treat as needed   Sherlie Ban, PT, DPT 07/02/23 9:50 AM

## 2023-07-02 DIAGNOSIS — R339 Retention of urine, unspecified: Secondary | ICD-10-CM | POA: Diagnosis not present

## 2023-07-06 ENCOUNTER — Ambulatory Visit: Payer: Medicare Other | Admitting: Physical Therapy

## 2023-07-08 ENCOUNTER — Ambulatory Visit: Payer: Medicare Other | Admitting: Physical Therapy

## 2023-07-08 ENCOUNTER — Encounter: Payer: Self-pay | Admitting: Physical Therapy

## 2023-07-08 VITALS — BP 139/84 | HR 83

## 2023-07-08 DIAGNOSIS — R269 Unspecified abnormalities of gait and mobility: Secondary | ICD-10-CM | POA: Diagnosis not present

## 2023-07-08 DIAGNOSIS — R2681 Unsteadiness on feet: Secondary | ICD-10-CM | POA: Diagnosis not present

## 2023-07-08 DIAGNOSIS — M6281 Muscle weakness (generalized): Secondary | ICD-10-CM | POA: Diagnosis not present

## 2023-07-08 DIAGNOSIS — R42 Dizziness and giddiness: Secondary | ICD-10-CM

## 2023-07-08 DIAGNOSIS — H8112 Benign paroxysmal vertigo, left ear: Secondary | ICD-10-CM | POA: Diagnosis not present

## 2023-07-08 DIAGNOSIS — R2689 Other abnormalities of gait and mobility: Secondary | ICD-10-CM

## 2023-07-08 NOTE — Therapy (Signed)
OUTPATIENT PHYSICAL THERAPY NEURO TREATMENT  Patient Name: [name] MRN: [id number] DOB:11/13/1955, 67 y.o., female Today's Date: 07/08/2023   PCP: Madelin Headings, MD  REFERRING PROVIDER: Curt Bears, MD   END OF SESSION:  PT End of Session - 07/08/23 1536     Visit Number 7    Number of Visits 17    Date for PT Re-Evaluation 08/07/23    Authorization Type [employer]    PT Start Time 1534    PT Stop Time 1614    PT Time Calculation (min) 40 min    Equipment Utilized During Treatment Gait belt    Activity Tolerance Patient tolerated treatment well    Behavior During Therapy WFL for tasks assessed/performed               Past Medical History:  Diagnosis Date   Ambulates with cane 04/08/2021   prn occ walker prn   Arthritis    left knee   Diverticulosis of colon    GERD (gastroesophageal reflux disease)    History of concussion 2019   no residual   Hypertension    Multiple sclerosis, relapsing-remitting (HCC) 2007   neurologist--- dr Theodosia Paling ([facility] [place]   Neurogenic bladder    Self-catheterizes urinary bladder 04/08/2021   self caths 3 to 4 x daily   Sjogren's syndrome (HCC) 10/23/2021   runs vaporizer in room @ night to help with   Torn ligament 04/01/2022   left knee   Urge incontinence    Uses walker 04/08/2021   prn   Varicose veins of both lower extremities    Past Surgical History:  Procedure Laterality Date   BOTOX INJECTION N/A 04/10/2021   Procedure: BOTOX INJECTION WITH CYSTOSCOPY;  Surgeon: Rene Paci, MD;  Location: [facility];  Service: Urology;  Laterality: N/A;   BOTOX INJECTION N/A 10/25/2021   Procedure: CYSTOSCOPY WITH BOTOX INJECTION 300 UNITS;  Surgeon: Rene Paci, MD;  Location: [facility];  Service: Urology;  Laterality: N/A;   BOTOX INJECTION N/A 04/09/2022   Procedure: BOTOX INJECTION 300 units WITH  CYSTOSCOPY;  Surgeon: Rene Paci, MD;  Location: [facility];  Service: Urology;  Laterality: N/A;   BOTOX INJECTION N/A 10/15/2022   Procedure: BOTOX  300 UNITSINJECTION;  Surgeon: Rene Paci, MD;  Location: [facility];  Service: Urology;  Laterality: N/A;   CATARACT EXTRACTION W/ INTRAOCULAR LENS IMPLANT Left 2022   COLONOSCOPY WITH PROPOFOL  01/10/2015   dr d. Juanda Chance   CYSTOSCOPY WITH INJECTION N/A 10/15/2022   Procedure: CYSTOSCOPY WITH INJECTION;  Surgeon: Rene Paci, MD;  Location: [facility];  Service: Urology;  Laterality: N/A;  30 MINS   CYSTOSCOPY WITH INJECTION N/A 04/24/2023   Procedure: CYSTOSCOPY WITH BOTOX INJECTION 300 UNITS;  Surgeon: Rene Paci, MD;  Location: [facility];  Service: Urology;  Laterality: N/A;   SHOULDER ARTHROSCOPY WITH ROTATOR CUFF REPAIR AND SUBACROMIAL DECOMPRESSION  06/01/2012   Procedure: SHOULDER ARTHROSCOPY WITH ROTATOR CUFF REPAIR AND SUBACROMIAL DECOMPRESSION;  Surgeon: Drucilla Schmidt, MD;  Location: [facility];  Service: Orthopedics;  Laterality: Left;  LEFT SHOULDER ARTHROSCOPY WITH SUBACROMIAL DECOMPRESION AND DEBRIDEMENT OF PARTIAL ROTATOR CUFF TEAR   TONSILLECTOMY AND ADENOIDECTOMY  1962   TOTAL VAGINAL HYSTERECTOMY  12/01/2006   @[facility]  by dr m. grewal;   W/ ANTERIOR & POSTERIOR REPAIR AND SACROPINOUS LIGAMENT FIXATION OF VAGINAL VAULT  Patient Active Problem List   Diagnosis Date Noted   Positive ANA (antinuclear antibody) 11/28/2021   Discoloration of skin of toe 11/28/2021   Rectal bleeding 12/05/2016   Internal hemorrhoids 12/05/2016   Hx of falling 03/17/2016   Nausea alone 02/13/2014   Other malaise and fatigue 02/13/2014   Postmenopausal estrogen deficiency 02/24/2013   Visit for preventive health examination 02/24/2013   Family hx osteoporosis 02/21/2013   Recurrent UTI 01/09/2012   Suprapubic  tenderness 12/01/2011   GERD (gastroesophageal reflux disease) 10/15/2011   Dysphagia, pharyngoesophageal 10/15/2011   Voiding dysfunction 09/24/2011   Hypertriglyceridemia 02/09/2011   Vitamin D deficiency 12/13/2008   Constipation 12/13/2008   MUSCLE SPASM 12/13/2008   DIZZINESS 12/13/2008   HYPOKALEMIA, HX OF 12/13/2008   DISORDER, ADJUSTMENT W/DEPRESSED MOOD 04/30/2007   SCLEROSIS, MULTIPLE 04/30/2007   Essential hypertension 04/30/2007   VARICOSE VEIN, LWR EXTREMITIES W/INFLAMMATION 04/30/2007   Allergic rhinitis 04/30/2007    ONSET DATE: 05/22/2023  REFERRING DIAG: G35 (ICD-10-CM) - Multiple sclerosis R26.89 (ICD-10-CM) - Other abnormalities of gait and mobility  THERAPY DIAG:  Dizziness and giddiness  Unsteadiness on feet  Other abnormalities of gait and mobility   Rationale for Evaluation and Treatment: Rehabilitation  SUBJECTIVE:                                                                                                                                                                                             SUBJECTIVE STATEMENT:  Going on a cruise a week from Friday. Have not really noticed the dizziness, but does tend to keep herself out of these situations. Wants her dizziness to be re-checked again today.   Pt accompanied by: self  PERTINENT HISTORY: MS relapsing-remitting, arthritis, hypertension   PAIN:  Are you having pain? Notes L shoulder/rhomboid is bothersome a little bit.   PRECAUTIONS: Fall  RED FLAGS: None   WEIGHT BEARING RESTRICTIONS: No  FALLS: Has patient fallen in last 6 months?  Last fall was one year ago, many near falls in the last 6 months   LIVING ENVIRONMENT: Lives with: lives with their spouse Lives in: House/apartment Stairs: Yes: External: 4 steps; on right going up Has following equipment at home: Single point cane and Walker - 4 wheeled  PLOF: Independent with basic ADLs and Independent with household mobility  with device  PATIENT GOALS: "stand on my own and not panic about falling"   OBJECTIVE:  Note: Objective measures were completed at Evaluation unless otherwise noted.  DIAGNOSTIC FINDINGS: 10/09/2022 MR Lumbar Spine  IMPRESSION: 1. Chronic L1 vertebral body compression fracture with mild marrow edema along the anterior superior corner  and approximately 60% height loss. 2. No acute osseous injury of the lumbar spine. 3. Mild lumbar spine spondylosis as described above.  COGNITION: Overall cognitive status: Within functional limits for tasks assessed   SENSATION: WFL   POSTURE: rounded shoulders, forward head, decreased lumbar lordosis, and increased thoracic kyphosis   LOWER EXTREMITY MMT:    MMT Right Eval Left Eval  Hip flexion 3 4  Hip extension    Hip abduction 3 3  Hip adduction 4 4  Hip internal rotation    Hip external rotation    Knee flexion 4 5  Knee extension 4 5  Ankle dorsiflexion 4 4  Ankle plantarflexion 4 4  Ankle inversion    Ankle eversion    (Blank rows = not tested)     TODAY'S TREATMENT:       Therapeutic Activity:  Vitals:   07/08/23 1539  BP: 139/84  Pulse: 83       POSITIONAL TESTING: Left Dix-Hallpike: no nystagmus Left Sidelying: no nystagmus No dizziness or any with return to upright.   Pt wanting to walk to touch the gold buzzer just to see how it feels without any AD, ambulated 52' with CGA, pt ambulates with a wide BOS and decr stride length.    Goal Assessment: 5x sit <> stand: 26.7 seconds, 24.6 seconds for 2nd attempt, using BUE support    [facility] PT Assessment - 07/08/23 0001       Berg Balance Test   Sit to Stand Able to stand  independently using hands    Standing Unsupported Able to stand safely 2 minutes    Sitting with Back Unsupported but Feet Supported on Floor or Stool Able to sit safely and securely 2 minutes    Stand to Sit Sits safely with minimal use of hands    Transfers Able to transfer safely,  definite need of hands    Standing Unsupported with Eyes Closed Able to stand 10 seconds with supervision    Standing Unsupported with Feet Together Able to place feet together independently and stand for 1 minute with supervision    From Standing, Reach Forward with Outstretched Arm Can reach confidently >25 cm (10")    From Standing Position, Pick up Object from Floor Able to pick up shoe, needs supervision    From Standing Position, Turn to Look Behind Over each Shoulder Looks behind from both sides and weight shifts well    Turn 360 Degrees Able to turn 360 degrees safely but slowly   5.44 to L, 5.6 to R   Standing Unsupported, Alternately Place Feet on Step/Stool Able to complete >2 steps/needs minimal assist    Standing Unsupported, One Foot in Front Able to take small step independently and hold 30 seconds    Standing on One Leg Tries to lift leg/unable to hold 3 seconds but remains standing independently    Total Score 41    Berg comment: 41/56 = Significant Fall Risk             NMR:   With 6 blaze pods in a semi-circle on the floor to work on weight shifting, visual scanning, foot clearance, and SLS time. Performed on random tap setting. Mainly CGA for balance, but a couple instances of min A.  Performed 2 bouts  of 1 minute: 17 hits, 23 hits Performed 2 bouts  of 1 minute (put 2 pods on 4" step for incr foot clearance/SLS time): 23 hits, 25 hits    PATIENT EDUCATION:  Education details: Discussed resolution of BPPV at this time, results of goals, discussed continue using rollator due to incr fall risk.  Person educated: Patient Education method: Explanation, Demonstration, Tactile cues, and Verbal cues Education comprehension: verbalized understanding, returned demonstration, verbal cues required, tactile cues required, and needs further education  HOME EXERCISE PROGRAM:  Access Code: [email or web address] URL: [email or web address] Date: 06/17/2023 Prepared by: Sherlie Ban  Exercises - Sit to Stand with Armchair  - 2 x daily - 5 x weekly - 2 sets - 5 reps - Heel Toe Raises with Counter Support  - 1-2 x daily - 5 x weekly - 2 sets - 10 reps - Standing Single Leg Stance with Counter Support  - 1-2 x daily - 5 x weekly - 3 sets - 10-15 hold - Standing March with Counter Support  - 1-2 x daily - 5 x weekly - 1-2 sets - 10 reps - Standing Hip Abduction with Counter Support  - 1-2 x daily - 5 x weekly - 2 sets - 10 reps - Standing Hip Extension with Counter Support  - 1 x daily - 7 x weekly - 3 sets - 10 reps - Side Stepping with Counter Support  - 1 x daily - 7 x weekly - 3 sets - 10 reps - Backward Walking with Counter Support  - 1 x daily - 7 x weekly - 3 sets - 10 reps - Side Stepping with Resistance at Ankles and Counter Support  - 1 x daily - 7 x weekly - 1 sets - 3 reps - Forward and Backward Monster Walk with Resistance at Ankles and Counter Support  - 1 x daily - 7 x weekly - 1 sets - 3 reps  GOALS: Goals reviewed with patient? Yes  SHORT TERM GOALS: Target date: 07/10/23  Pt will be independent with initial HEP for improved balance and decreased fear of falling.  Baseline: pt has been performing, but still challenged with R leg   Goal status: MET  2.  ABC goal Baseline: 36.25% confidence, LTG written  Goal status: MET  3.  Pt will improve 5x STS to </= 25 sec to demo improved functional LE strength and transfers.  Baseline: 28.25 sec  24.6 seconds on 07/08/23 Goal status: MET  4.  Pt will improve Berg score to 42 to demonstrate improved balance with standing and weight shifting and decreased fall risk.  Baseline: 36; 41/56 on 12/11  Goal status: PARTIALLY MET    LONG TERM GOALS: Target date: 08/07/23  Pt will be independent with final HEP for improved balance and decreased fear of falling.  Baseline: to be given Goal status: INITIAL  2.   Pt will improve ABC scale goal to at least 45% in order to demo improved confidence.   Baseline: 36.25% confidence Goal status: INITIAL  3.  Pt will improve 5x STS to </= 22 sec to demo improved functional LE strength and transfers.  Baseline: 28.25 sec Goal status: INITIAL  4.  Pt will improve Berg score to 45 to demonstrate improved balance with standing and weight shifting and decreased fall risk.  Baseline: 36 Goal status: INITIAL  5.  Pt will demo negative L posterior canal BPPV in order to demo decr dizziness with daily life.  Baseline: positive Goal status: INITIAL  ASSESSMENT:  CLINICAL IMPRESSION: Re-assessed L posterior canal BPPV in L DixHallpike and L sidelying with pt negative for BPPV and did not have any nystagmus/dizziness. Pt also reports that she has not noticed  her dizziness. Checked pt's STGs today with pt meeting 3 out of 4 STGs. Pt improved 5x sit <> stand with BUE support to 24.6 seconds and pt also demonstrating improved eccentric control when lowering back to mat table. Pt partially met goal in regards to BERG. Pt improved score to a 41/56 (from 36/56), but not quite to goal level. Educated to continue to use rollator due to significant fall risk. Will continue per POC.   OBJECTIVE IMPAIRMENTS: Abnormal gait, decreased balance, decreased coordination, decreased mobility, difficulty walking, and decreased strength.   ACTIVITY LIMITATIONS: carrying, lifting, bending, standing, stairs, locomotion level, and caring for others  PARTICIPATION LIMITATIONS: meal prep, cleaning, laundry, shopping, and community activity  PERSONAL FACTORS: Age, Time since onset of injury/illness/exacerbation, and Transportation are also affecting patient's functional outcome.   REHAB POTENTIAL: Good  CLINICAL DECISION MAKING: Stable/uncomplicated  EVALUATION COMPLEXITY: Low  PLAN:  PT FREQUENCY: 1-2x/week  PT DURATION: 8 weeks  PLANNED INTERVENTIONS: 97164- PT Re-evaluation, 97110-Therapeutic exercises, 97530- Therapeutic activity, 97112- Neuromuscular  re-education, 97535- Self Care, [id number]- Manual therapy, [phone]- Gait training, [phone]- Orthotic Fit/training, [phone]- Canalith repositioning, [phone]- Aquatic Therapy, Patient/Family education, Balance training, Stair training, Dry Needling, Vestibular training, and DME instructions  PLAN FOR NEXT SESSION: work on strength, work on Development worker, international aid tasks with decr UE support, sit <> stands, hip flexor/hip ABD strengthening, staggered sit to stands, core strengthening, incorporate UE tasks, step ups/laterally  L posterior canal BPPV was clear on 07/08/23   Sherlie Ban, PT, DPT 07/08/23 4:25 PM

## 2023-07-10 ENCOUNTER — Ambulatory Visit: Payer: Medicare Other | Admitting: Physical Therapy

## 2023-07-10 VITALS — BP 150/89 | HR 86

## 2023-07-10 DIAGNOSIS — R2689 Other abnormalities of gait and mobility: Secondary | ICD-10-CM | POA: Diagnosis not present

## 2023-07-10 DIAGNOSIS — R42 Dizziness and giddiness: Secondary | ICD-10-CM | POA: Diagnosis not present

## 2023-07-10 DIAGNOSIS — M6281 Muscle weakness (generalized): Secondary | ICD-10-CM | POA: Diagnosis not present

## 2023-07-10 DIAGNOSIS — R269 Unspecified abnormalities of gait and mobility: Secondary | ICD-10-CM | POA: Diagnosis not present

## 2023-07-10 DIAGNOSIS — F40298 Other specified phobia: Secondary | ICD-10-CM

## 2023-07-10 DIAGNOSIS — R2681 Unsteadiness on feet: Secondary | ICD-10-CM

## 2023-07-10 DIAGNOSIS — H8112 Benign paroxysmal vertigo, left ear: Secondary | ICD-10-CM | POA: Diagnosis not present

## 2023-07-10 NOTE — Therapy (Signed)
OUTPATIENT PHYSICAL THERAPY NEURO TREATMENT  Patient Name: [name] MRN: [id number] DOB:[birth year], 67 y.o., female Today's Date: 07/10/2023   PCP: Madelin Headings, MD  REFERRING PROVIDER: Curt Bears, MD   END OF SESSION:  PT End of Session - 07/10/23 0807     Visit Number 8    Number of Visits 17    Date for PT Re-Evaluation 08/07/23    Authorization Type [employer]    PT Start Time 0805   pt arrived late   PT Stop Time 0845    PT Time Calculation (min) 40 min    Equipment Utilized During Treatment Gait belt    Activity Tolerance Patient tolerated treatment well    Behavior During Therapy [facility] for tasks assessed/performed                Past Medical History:  Diagnosis Date   Ambulates with cane 04/08/2021   prn occ walker prn   Arthritis    left knee   Diverticulosis of colon    GERD (gastroesophageal reflux disease)    History of concussion 2019   no residual   Hypertension    Multiple sclerosis, relapsing-remitting (HCC) 2007   neurologist--- dr Theodosia Paling ([facility] [place]   Neurogenic bladder    Self-catheterizes urinary bladder 04/08/2021   self caths 3 to 4 x daily   Sjogren's syndrome (HCC) 10/23/2021   runs vaporizer in room @ night to help with   Torn ligament 04/01/2022   left knee   Urge incontinence    Uses walker 04/08/2021   prn   Varicose veins of both lower extremities    Past Surgical History:  Procedure Laterality Date   BOTOX INJECTION N/A 04/10/2021   Procedure: BOTOX INJECTION WITH CYSTOSCOPY;  Surgeon: Rene Paci, MD;  Location: [facility];  Service: Urology;  Laterality: N/A;   BOTOX INJECTION N/A 10/25/2021   Procedure: CYSTOSCOPY WITH BOTOX INJECTION 300 UNITS;  Surgeon: Rene Paci, MD;  Location: [facility];  Service: Urology;  Laterality: N/A;   BOTOX INJECTION N/A 04/09/2022   Procedure: BOTOX INJECTION 300  units WITH CYSTOSCOPY;  Surgeon: Rene Paci, MD;  Location: [facility];  Service: Urology;  Laterality: N/A;   BOTOX INJECTION N/A 10/15/2022   Procedure: BOTOX  300 UNITSINJECTION;  Surgeon: Rene Paci, MD;  Location: [facility];  Service: Urology;  Laterality: N/A;   CATARACT EXTRACTION W/ INTRAOCULAR LENS IMPLANT Left 2022   COLONOSCOPY WITH PROPOFOL  01/10/2015   dr d. Juanda Chance   CYSTOSCOPY WITH INJECTION N/A 10/15/2022   Procedure: CYSTOSCOPY WITH INJECTION;  Surgeon: Rene Paci, MD;  Location: [facility];  Service: Urology;  Laterality: N/A;  30 MINS   CYSTOSCOPY WITH INJECTION N/A 04/24/2023   Procedure: CYSTOSCOPY WITH BOTOX INJECTION 300 UNITS;  Surgeon: Rene Paci, MD;  Location: [facility];  Service: Urology;  Laterality: N/A;   SHOULDER ARTHROSCOPY WITH ROTATOR CUFF REPAIR AND SUBACROMIAL DECOMPRESSION  06/01/2012   Procedure: SHOULDER ARTHROSCOPY WITH ROTATOR CUFF REPAIR AND SUBACROMIAL DECOMPRESSION;  Surgeon: Drucilla Schmidt, MD;  Location: [facility];  Service: Orthopedics;  Laterality: Left;  LEFT SHOULDER ARTHROSCOPY WITH SUBACROMIAL DECOMPRESION AND DEBRIDEMENT OF PARTIAL ROTATOR CUFF TEAR   TONSILLECTOMY AND ADENOIDECTOMY  1962   TOTAL VAGINAL HYSTERECTOMY  12/01/2006   @[facility]  by dr m. Vincente Poli;   W/ ANTERIOR & POSTERIOR REPAIR AND SACROPINOUS  LIGAMENT FIXATION OF VAGINAL VAULT   Patient Active Problem List   Diagnosis Date Noted   Positive ANA (antinuclear antibody) 11/28/2021   Discoloration of skin of toe 11/28/2021   Rectal bleeding 12/05/2016   Internal hemorrhoids 12/05/2016   Hx of falling 03/17/2016   Nausea alone 02/13/2014   Other malaise and fatigue 02/13/2014   Postmenopausal estrogen deficiency 02/24/2013   Visit for preventive health examination 02/24/2013   Family hx osteoporosis 02/21/2013   Recurrent UTI 01/09/2012    Suprapubic tenderness 12/01/2011   GERD (gastroesophageal reflux disease) 10/15/2011   Dysphagia, pharyngoesophageal 10/15/2011   Voiding dysfunction 09/24/2011   Hypertriglyceridemia 02/09/2011   Vitamin D deficiency 12/13/2008   Constipation 12/13/2008   MUSCLE SPASM 12/13/2008   DIZZINESS 12/13/2008   HYPOKALEMIA, HX OF 12/13/2008   DISORDER, ADJUSTMENT W/DEPRESSED MOOD 04/30/2007   SCLEROSIS, MULTIPLE 04/30/2007   Essential hypertension 04/30/2007   VARICOSE VEIN, LWR EXTREMITIES W/INFLAMMATION 04/30/2007   Allergic rhinitis 04/30/2007    ONSET DATE: 05/22/2023  REFERRING DIAG: G35 (ICD-10-CM) - Multiple sclerosis R26.89 (ICD-10-CM) - Other abnormalities of gait and mobility  THERAPY DIAG:  Unsteadiness on feet  Other abnormalities of gait and mobility  Muscle weakness (generalized)  Abnormality of gait  Fear of falling  Impairment of balance   Rationale for Evaluation and Treatment: Rehabilitation  SUBJECTIVE:                                                                                                                                                                                             SUBJECTIVE STATEMENT:  Pt denies any falls or acute changes since last visit. Pt reports ongoing chronic low back pain, gets better as the day progresses. Pt reports her dizziness is much better.  Pt reports her RLE is weaker today, just has to pay extra attention to it. Pt wanting to work on hip strength and mobility, has difficulty getting dressed and putting on shoes.  Pt accompanied by: self  PERTINENT HISTORY: MS relapsing-remitting, arthritis, hypertension   PAIN:  Are you having pain? Notes L shoulder/rhomboid is bothersome a little bit.   PRECAUTIONS: Fall  RED FLAGS: None   WEIGHT BEARING RESTRICTIONS: No  FALLS: Has patient fallen in last 6 months?  Last fall was one year ago, many near falls in the last 6 months   LIVING ENVIRONMENT: Lives with:  lives with their spouse Lives in: House/apartment Stairs: Yes: External: 4 steps; on right going up Has following equipment at home: Single point cane and Walker - 4 wheeled  PLOF: Independent with basic ADLs and Independent with household mobility with device  PATIENT  GOALS: "stand on my own and not panic about falling"   OBJECTIVE:  Note: Objective measures were completed at Evaluation unless otherwise noted.  DIAGNOSTIC FINDINGS: 10/09/2022 MR Lumbar Spine  IMPRESSION: 1. Chronic L1 vertebral body compression fracture with mild marrow edema along the anterior superior corner and approximately 60% height loss. 2. No acute osseous injury of the lumbar spine. 3. Mild lumbar spine spondylosis as described above.  COGNITION: Overall cognitive status: Within functional limits for tasks assessed   SENSATION: WFL   POSTURE: rounded shoulders, forward head, decreased lumbar lordosis, and increased thoracic kyphosis   LOWER EXTREMITY MMT:    MMT Right Eval Left Eval  Hip flexion 3 4  Hip extension    Hip abduction 3 3  Hip adduction 4 4  Hip internal rotation    Hip external rotation    Knee flexion 4 5  Knee extension 4 5  Ankle dorsiflexion 4 4  Ankle plantarflexion 4 4  Ankle inversion    Ankle eversion    (Blank rows = not tested)     TODAY'S TREATMENT:       Therapeutic Activity:  Vitals:   07/10/23 0811  BP: (!) 150/89  Pulse: 86   Seated BP assessed at beginning of session in LUE, elevated but [facility] for safe participation in therapy session.  To work on hip abductor strengthening in // bars with BUE support and SBA: Sidesteps over 4" hurdles with 3# ankle weights Difficulty clearing RLE without circumduction Regressed to sidesteps over foam beams with 3# ankle weights 3 x 10 ft L/R, improved performance compared to hurdles 3 x 10 ft L/R with weights removed and decreased to just fingertip support  5 Blaze pods on random setting for improved hip  abductor and flexor strengthening.  Performed on 1 minute intervals with 30 rest periods.  Pt requires SBA guarding. Round 1:  lateral with 2 end pieces setup.  8 hits. Round 2:  lateral with 2 end pieces on 2" steps setup.  12 hits. Round 3:  added 3# ankle weights setup.  12 hits.  Round 1:  lateral with 2 end pieces with 3# ankle weights setup, 2" and 4" steps.  13 hits. Round 2:  lateral with 2 end pieces with 3# ankle weights setup, 2" and 4" steps..  14 hits. Round 3:  lateral with 2 end pieces with 3# ankle weights setup, 2" and 4" steps. 11 hits.   PATIENT EDUCATION:  Education details: continue HEP Person educated: Patient Education method: Explanation, Demonstration, Tactile cues, and Verbal cues Education comprehension: verbalized understanding, returned demonstration, verbal cues required, tactile cues required, and needs further education  HOME EXERCISE PROGRAM:  Access Code: [email or web address] URL: [email or web address] Date: 06/17/2023 Prepared by: Sherlie Ban  Exercises - Sit to Stand with Armchair  - 2 x daily - 5 x weekly - 2 sets - 5 reps - Heel Toe Raises with Counter Support  - 1-2 x daily - 5 x weekly - 2 sets - 10 reps - Standing Single Leg Stance with Counter Support  - 1-2 x daily - 5 x weekly - 3 sets - 10-15 hold - Standing March with Counter Support  - 1-2 x daily - 5 x weekly - 1-2 sets - 10 reps - Standing Hip Abduction with Counter Support  - 1-2 x daily - 5 x weekly - 2 sets - 10 reps - Standing Hip Extension with Counter Support  - 1 x daily - 7 x weekly - 3  sets - 10 reps - Side Stepping with Counter Support  - 1 x daily - 7 x weekly - 3 sets - 10 reps - Backward Walking with Counter Support  - 1 x daily - 7 x weekly - 3 sets - 10 reps - Side Stepping with Resistance at Ankles and Counter Support  - 1 x daily - 7 x weekly - 1 sets - 3 reps - Forward and Backward Monster Walk with Resistance at Ankles and Counter Support  - 1 x daily - 7 x  weekly - 1 sets - 3 reps  GOALS: Goals reviewed with patient? Yes  SHORT TERM GOALS: Target date: 07/10/23  Pt will be independent with initial HEP for improved balance and decreased fear of falling.  Baseline: pt has been performing, but still challenged with R leg   Goal status: MET  2.  ABC goal Baseline: 36.25% confidence, LTG written  Goal status: MET  3.  Pt will improve 5x STS to </= 25 sec to demo improved functional LE strength and transfers.  Baseline: 28.25 sec  24.6 seconds on 07/08/23 Goal status: MET  4.  Pt will improve Berg score to 42 to demonstrate improved balance with standing and weight shifting and decreased fall risk.  Baseline: 36; 41/56 on 12/11  Goal status: PARTIALLY MET    LONG TERM GOALS: Target date: 08/07/23  Pt will be independent with final HEP for improved balance and decreased fear of falling.  Baseline: to be given Goal status: INITIAL  2.   Pt will improve ABC scale goal to at least 45% in order to demo improved confidence.  Baseline: 36.25% confidence Goal status: INITIAL  3.  Pt will improve 5x STS to </= 22 sec to demo improved functional LE strength and transfers.  Baseline: 28.25 sec Goal status: INITIAL  4.  Pt will improve Berg score to 45 to demonstrate improved balance with standing and weight shifting and decreased fall risk.  Baseline: 36 Goal status: INITIAL  5.  Pt will demo negative L posterior canal BPPV in order to demo decr dizziness with daily life.  Baseline: positive Goal status: INITIAL  ASSESSMENT:  CLINICAL IMPRESSION: Emphasis of skilled PT session on working on hip abductor and flexor strengthening with various tasks in // bars as well as SLS stability. Pt does initially struggle to lift her RLE laterally over 4" hurdles, improved performance with 2" foam beams and with Blaze pods on 2" steps. Pt continues to benefit from skilled therapy services to work towards LTGs. Patient also with improved therapy  buy-in with "fun" tasks as compared to repetitious exercises. Continue POC.   OBJECTIVE IMPAIRMENTS: Abnormal gait, decreased balance, decreased coordination, decreased mobility, difficulty walking, and decreased strength.   ACTIVITY LIMITATIONS: carrying, lifting, bending, standing, stairs, locomotion level, and caring for others  PARTICIPATION LIMITATIONS: meal prep, cleaning, laundry, shopping, and community activity  PERSONAL FACTORS: Age, Time since onset of injury/illness/exacerbation, and Transportation are also affecting patient's functional outcome.   REHAB POTENTIAL: Good  CLINICAL DECISION MAKING: Stable/uncomplicated  EVALUATION COMPLEXITY: Low  PLAN:  PT FREQUENCY: 1-2x/week  PT DURATION: 8 weeks  PLANNED INTERVENTIONS: 97164- PT Re-evaluation, 97110-Therapeutic exercises, 97530- Therapeutic activity, 97112- Neuromuscular re-education, 97535- Self Care, 16109- Manual therapy, [phone]- Gait training, [phone]- Orthotic Fit/training, [phone]- Canalith repositioning, [phone]- Aquatic Therapy, Patient/Family education, Balance training, Stair training, Dry Needling, Vestibular training, and DME instructions  PLAN FOR NEXT SESSION: work on strength, work on Development worker, international aid tasks with decr UE support, sit <>  stands, hip flexor/hip ABD strengthening, staggered sit to stands, core strengthening, incorporate UE tasks, step ups/laterally  L posterior canal BPPV was clear on 07/08/23    Peter Congo, PT Peter Congo, PT, DPT, CSRS  07/10/23 8:47 AM

## 2023-07-13 ENCOUNTER — Ambulatory Visit: Payer: Medicare Other | Admitting: Physical Therapy

## 2023-07-15 ENCOUNTER — Ambulatory Visit: Payer: Medicare Other | Admitting: Physical Therapy

## 2023-07-16 ENCOUNTER — Ambulatory Visit: Payer: Medicare Other | Admitting: Physical Therapy

## 2023-07-16 VITALS — BP 159/85 | HR 75

## 2023-07-16 DIAGNOSIS — F40298 Other specified phobia: Secondary | ICD-10-CM

## 2023-07-16 DIAGNOSIS — M6281 Muscle weakness (generalized): Secondary | ICD-10-CM | POA: Diagnosis not present

## 2023-07-16 DIAGNOSIS — R2689 Other abnormalities of gait and mobility: Secondary | ICD-10-CM | POA: Diagnosis not present

## 2023-07-16 DIAGNOSIS — R42 Dizziness and giddiness: Secondary | ICD-10-CM | POA: Diagnosis not present

## 2023-07-16 DIAGNOSIS — R2681 Unsteadiness on feet: Secondary | ICD-10-CM

## 2023-07-16 DIAGNOSIS — R269 Unspecified abnormalities of gait and mobility: Secondary | ICD-10-CM | POA: Diagnosis not present

## 2023-07-16 DIAGNOSIS — H8112 Benign paroxysmal vertigo, left ear: Secondary | ICD-10-CM | POA: Diagnosis not present

## 2023-07-16 NOTE — Therapy (Signed)
OUTPATIENT PHYSICAL THERAPY NEURO TREATMENT  Patient Name: [name] MRN: [id number] DOB:[birth year], 67 y.o., female Today's Date: 07/16/2023   PCP: Madelin Headings, MD  REFERRING PROVIDER: Curt Bears, MD   END OF SESSION:  PT End of Session - 07/16/23 0851     Visit Number 9    Number of Visits 17    Date for PT Re-Evaluation 08/07/23    Authorization Type [employer]    PT Start Time [phone]   pt arrived late   PT Stop Time 0930    PT Time Calculation (min) 40 min    Equipment Utilized During Treatment Gait belt    Activity Tolerance Patient tolerated treatment well    Behavior During Therapy [facility] for tasks assessed/performed                 Past Medical History:  Diagnosis Date   Ambulates with cane 04/08/2021   prn occ walker prn   Arthritis    left knee   Diverticulosis of colon    GERD (gastroesophageal reflux disease)    History of concussion 2019   no residual   Hypertension    Multiple sclerosis, relapsing-remitting (HCC) 2007   neurologist--- dr Theodosia Paling ([facility]-- [place]   Neurogenic bladder    Self-catheterizes urinary bladder 04/08/2021   self caths 3 to 4 x daily   Sjogren's syndrome (HCC) 10/23/2021   runs vaporizer in room @ night to help with   Torn ligament 04/01/2022   left knee   Urge incontinence    Uses walker 04/08/2021   prn   Varicose veins of both lower extremities    Past Surgical History:  Procedure Laterality Date   BOTOX INJECTION N/A 04/10/2021   Procedure: BOTOX INJECTION WITH CYSTOSCOPY;  Surgeon: Rene Paci, MD;  Location: [facility];  Service: Urology;  Laterality: N/A;   BOTOX INJECTION N/A 10/25/2021   Procedure: CYSTOSCOPY WITH BOTOX INJECTION 300 UNITS;  Surgeon: Rene Paci, MD;  Location: [facility];  Service: Urology;  Laterality: N/A;   BOTOX INJECTION N/A 04/09/2022   Procedure: BOTOX INJECTION 300  units WITH CYSTOSCOPY;  Surgeon: Rene Paci, MD;  Location: [facility];  Service: Urology;  Laterality: N/A;   BOTOX INJECTION N/A 10/15/2022   Procedure: BOTOX  300 UNITSINJECTION;  Surgeon: Rene Paci, MD;  Location: [facility];  Service: Urology;  Laterality: N/A;   CATARACT EXTRACTION W/ INTRAOCULAR LENS IMPLANT Left 2022   COLONOSCOPY WITH PROPOFOL  01/10/2015   dr d. Juanda Chance   CYSTOSCOPY WITH INJECTION N/A 10/15/2022   Procedure: CYSTOSCOPY WITH INJECTION;  Surgeon: Rene Paci, MD;  Location: [facility];  Service: Urology;  Laterality: N/A;  30 MINS   CYSTOSCOPY WITH INJECTION N/A 04/24/2023   Procedure: CYSTOSCOPY WITH BOTOX INJECTION 300 UNITS;  Surgeon: Rene Paci, MD;  Location: [facility];  Service: Urology;  Laterality: N/A;   SHOULDER ARTHROSCOPY WITH ROTATOR CUFF REPAIR AND SUBACROMIAL DECOMPRESSION  06/01/2012   Procedure: SHOULDER ARTHROSCOPY WITH ROTATOR CUFF REPAIR AND SUBACROMIAL DECOMPRESSION;  Surgeon: Drucilla Schmidt, MD;  Location: [facility];  Service: Orthopedics;  Laterality: Left;  LEFT SHOULDER ARTHROSCOPY WITH SUBACROMIAL DECOMPRESION AND DEBRIDEMENT OF PARTIAL ROTATOR CUFF TEAR   TONSILLECTOMY AND ADENOIDECTOMY  1962   TOTAL VAGINAL HYSTERECTOMY  12/01/2006   @[facility]  by dr m. Vincente Poli;   W/ ANTERIOR & POSTERIOR REPAIR AND  SACROPINOUS LIGAMENT FIXATION OF VAGINAL VAULT   Patient Active Problem List   Diagnosis Date Noted   Positive ANA (antinuclear antibody) 11/28/2021   Discoloration of skin of toe 11/28/2021   Rectal bleeding 12/05/2016   Internal hemorrhoids 12/05/2016   Hx of falling 03/17/2016   Nausea alone 02/13/2014   Other malaise and fatigue 02/13/2014   Postmenopausal estrogen deficiency 02/24/2013   Visit for preventive health examination 02/24/2013   Family hx osteoporosis 02/21/2013   Recurrent UTI 01/09/2012    Suprapubic tenderness 12/01/2011   GERD (gastroesophageal reflux disease) 10/15/2011   Dysphagia, pharyngoesophageal 10/15/2011   Voiding dysfunction 09/24/2011   Hypertriglyceridemia 02/09/2011   Vitamin D deficiency 12/13/2008   Constipation 12/13/2008   MUSCLE SPASM 12/13/2008   DIZZINESS 12/13/2008   HYPOKALEMIA, HX OF 12/13/2008   DISORDER, ADJUSTMENT W/DEPRESSED MOOD 04/30/2007   SCLEROSIS, MULTIPLE 04/30/2007   Essential hypertension 04/30/2007   VARICOSE VEIN, LWR EXTREMITIES W/INFLAMMATION 04/30/2007   Allergic rhinitis 04/30/2007    ONSET DATE: 05/22/2023  REFERRING DIAG: G35 (ICD-10-CM) - Multiple sclerosis R26.89 (ICD-10-CM) - Other abnormalities of gait and mobility  THERAPY DIAG:  Unsteadiness on feet  Other abnormalities of gait and mobility  Muscle weakness (generalized)  Abnormality of gait  Fear of falling  Impairment of balance  Abnormality of gait due to impairment of balance   Rationale for Evaluation and Treatment: Rehabilitation  SUBJECTIVE:                                                                                                                                                                                             SUBJECTIVE STATEMENT:  Pt's back pain (rhomboid pain) is bothering her again, shot is wearing off. Pt reports she feels better once she gets moving. Pt leaving for a week-long [place] cruise tomorrow.  Pt accompanied by: self  PERTINENT HISTORY: MS relapsing-remitting, arthritis, hypertension   PAIN:  Are you having pain? Notes L shoulder/rhomboid is bothersome a little bit.   PRECAUTIONS: Fall  RED FLAGS: None   WEIGHT BEARING RESTRICTIONS: No  FALLS: Has patient fallen in last 6 months?  Last fall was one year ago, many near falls in the last 6 months   LIVING ENVIRONMENT: Lives with: lives with their spouse Lives in: House/apartment Stairs: Yes: External: 4 steps; on right going up Has following  equipment at home: Single point cane and Walker - 4 wheeled  PLOF: Independent with basic ADLs and Independent with household mobility with device  PATIENT GOALS: "stand on my own and not panic about falling"   OBJECTIVE:  Note: Objective measures were completed at Evaluation unless otherwise  noted.  DIAGNOSTIC FINDINGS: 10/09/2022 MR Lumbar Spine  IMPRESSION: 1. Chronic L1 vertebral body compression fracture with mild marrow edema along the anterior superior corner and approximately 60% height loss. 2. No acute osseous injury of the lumbar spine. 3. Mild lumbar spine spondylosis as described above.  COGNITION: Overall cognitive status: Within functional limits for tasks assessed   SENSATION: WFL   POSTURE: rounded shoulders, forward head, decreased lumbar lordosis, and increased thoracic kyphosis   LOWER EXTREMITY MMT:    MMT Right Eval Left Eval  Hip flexion 3 4  Hip extension    Hip abduction 3 3  Hip adduction 4 4  Hip internal rotation    Hip external rotation    Knee flexion 4 5  Knee extension 4 5  Ankle dorsiflexion 4 4  Ankle plantarflexion 4 4  Ankle inversion    Ankle eversion    (Blank rows = not tested)     TODAY'S TREATMENT:       Therapeutic Activity:  Vitals:   07/16/23 0854  BP: (!) 159/85  Pulse: 75    Seated BP assessed at beginning of session in LUE, elevated but [facility] for safe participation in therapy session.  To work on static standing balance, SLS, and reaching outside BOS: 5 Blaze pods on random setting for improved reaching outside BOS with UE and LE.  Performed on 1 minute intervals with 30 rest periods.  Pt requires SBA guarding. On solid ground with ballet bar BUE support: Round 1:  3 on mirror, 2 on floor setup.  19 hits. Round 2:  3 on mirror, 2 on floor setup.  25 hits. Round 3:  3 on mirror, 2 on floor setup.  24 hits. Notable errors/deficits:  N/A On airex with ballet bar BUE support, R side floor pod on 6" step,  focus on decreased UE support, one LOB but recovers Round 1:  3 on mirror, 2 on floor setup.  23 hits. Round 2:  3 on mirror, 2 on floor setup.  27 hits. Round 3:  3 on mirror, 2 on floor setup.  25 hits. On airex with ballet bar BUE support, L side pod on 6" step with focus on reachng more to R side and stance on RLE, one LOB but recovers Round 1:  3 on mirror, 2 on floor setup.  22 hits. Round 2:  3 on mirror, 2 on floor setup.  25 hits. Round 3:  3 on mirror, 2 on floor setup.  24 hits.   TherEx Various stretches in attempt to address pain/soreness in rhomboids: Seated anterior leans on blue swiss ball 2 x 30 sec each Thread the needle stretch 3 x 30 sec each B  Demonstrated use of theracane and provided information for where to purchase device as pt does have some relief of pain and trigger points with use of device.   PATIENT EDUCATION:  Education details: continue HEP, theracane Person educated: Patient Education method: Explanation, Demonstration, Tactile cues, and Verbal cues Education comprehension: verbalized understanding, returned demonstration, verbal cues required, tactile cues required, and needs further education  HOME EXERCISE PROGRAM:  Access Code: [email or web address] URL: [email or web address] Date: 06/17/2023 Prepared by: Sherlie Ban  Exercises - Sit to Stand with Armchair  - 2 x daily - 5 x weekly - 2 sets - 5 reps - Heel Toe Raises with Counter Support  - 1-2 x daily - 5 x weekly - 2 sets - 10 reps - Standing Single Leg Stance with Counter Support  -  1-2 x daily - 5 x weekly - 3 sets - 10-15 hold - Standing March with Counter Support  - 1-2 x daily - 5 x weekly - 1-2 sets - 10 reps - Standing Hip Abduction with Counter Support  - 1-2 x daily - 5 x weekly - 2 sets - 10 reps - Standing Hip Extension with Counter Support  - 1 x daily - 7 x weekly - 3 sets - 10 reps - Side Stepping with Counter Support  - 1 x daily - 7 x weekly - 3 sets - 10 reps -  Backward Walking with Counter Support  - 1 x daily - 7 x weekly - 3 sets - 10 reps - Side Stepping with Resistance at Ankles and Counter Support  - 1 x daily - 7 x weekly - 1 sets - 3 reps - Forward and Backward Monster Walk with Resistance at Ankles and Counter Support  - 1 x daily - 7 x weekly - 1 sets - 3 reps  GOALS: Goals reviewed with patient? Yes  SHORT TERM GOALS: Target date: 07/10/23  Pt will be independent with initial HEP for improved balance and decreased fear of falling.  Baseline: pt has been performing, but still challenged with R leg   Goal status: MET  2.  ABC goal Baseline: 36.25% confidence, LTG written  Goal status: MET  3.  Pt will improve 5x STS to </= 25 sec to demo improved functional LE strength and transfers.  Baseline: 28.25 sec  24.6 seconds on 07/08/23 Goal status: MET  4.  Pt will improve Berg score to 42 to demonstrate improved balance with standing and weight shifting and decreased fall risk.  Baseline: 36; 41/56 on 12/11  Goal status: PARTIALLY MET    LONG TERM GOALS: Target date: 08/07/23  Pt will be independent with final HEP for improved balance and decreased fear of falling.  Baseline: to be given Goal status: INITIAL  2.   Pt will improve ABC scale goal to at least 45% in order to demo improved confidence.  Baseline: 36.25% confidence Goal status: INITIAL  3.  Pt will improve 5x STS to </= 22 sec to demo improved functional LE strength and transfers.  Baseline: 28.25 sec Goal status: INITIAL  4.  Pt will improve Berg score to 45 to demonstrate improved balance with standing and weight shifting and decreased fall risk.  Baseline: 36 Goal status: INITIAL  5.  Pt will demo negative L posterior canal BPPV in order to demo decr dizziness with daily life.  Baseline: positive Goal status: INITIAL  ASSESSMENT:  CLINICAL IMPRESSION: Emphasis of skilled PT session on continuing to work on static standing balance, SLS, reaching outside  BOS, and scanning environment. Pt with most difficulty reaching to her R outside of BOS and with SLS on her RLE. Pt with no onset of dizziness with visual scanning task, excited by this. Pt does have minor onset of dizziness in quadruped position during thread the needle stretch that resolves with seated rest break. Pt continues to benefit from skilled PT services to work towards LTGs. Continue POC.   OBJECTIVE IMPAIRMENTS: Abnormal gait, decreased balance, decreased coordination, decreased mobility, difficulty walking, and decreased strength.   ACTIVITY LIMITATIONS: carrying, lifting, bending, standing, stairs, locomotion level, and caring for others  PARTICIPATION LIMITATIONS: meal prep, cleaning, laundry, shopping, and community activity  PERSONAL FACTORS: Age, Time since onset of injury/illness/exacerbation, and Transportation are also affecting patient's functional outcome.   REHAB POTENTIAL: Good  CLINICAL DECISION MAKING: Stable/uncomplicated  EVALUATION COMPLEXITY: Low  PLAN:  PT FREQUENCY: 1-2x/week  PT DURATION: 8 weeks  PLANNED INTERVENTIONS: 97164- PT Re-evaluation, 97110-Therapeutic exercises, 97530- Therapeutic activity, 97112- Neuromuscular re-education, 97535- Self Care, [id number]- Manual therapy, [phone]- Gait training, [phone]- Orthotic Fit/training, [phone]- Canalith repositioning, [phone]- Aquatic Therapy, Patient/Family education, Balance training, Stair training, Dry Needling, Vestibular training, and DME instructions  PLAN FOR NEXT SESSION: 10th PN, work on strength, work on Development worker, international aid tasks with decr UE support, sit <> stands, hip flexor/hip ABD strengthening, staggered sit to stands, core strengthening, incorporate UE tasks, step ups/laterally, did she get theracane? Would she be interested in DN?  L posterior canal BPPV was clear on 07/08/23    Peter Congo, PT Peter Congo, PT, DPT, CSRS  07/16/23 9:37 AM

## 2023-07-27 ENCOUNTER — Ambulatory Visit: Payer: Medicare Other | Admitting: Physical Therapy

## 2023-07-27 DIAGNOSIS — J069 Acute upper respiratory infection, unspecified: Secondary | ICD-10-CM | POA: Diagnosis not present

## 2023-07-28 DIAGNOSIS — G35 Multiple sclerosis: Secondary | ICD-10-CM | POA: Diagnosis not present

## 2023-07-28 DIAGNOSIS — M7918 Myalgia, other site: Secondary | ICD-10-CM | POA: Diagnosis not present

## 2023-07-28 DIAGNOSIS — Z79899 Other long term (current) drug therapy: Secondary | ICD-10-CM | POA: Diagnosis not present

## 2023-07-31 ENCOUNTER — Ambulatory Visit: Payer: Medicare Other | Admitting: Physical Therapy

## 2023-08-03 ENCOUNTER — Ambulatory Visit: Payer: Medicare Other | Attending: Neurology | Admitting: Physical Therapy

## 2023-08-03 ENCOUNTER — Encounter: Payer: Self-pay | Admitting: Physical Therapy

## 2023-08-03 VITALS — BP 142/83 | HR 85

## 2023-08-03 DIAGNOSIS — F40298 Other specified phobia: Secondary | ICD-10-CM | POA: Diagnosis present

## 2023-08-03 DIAGNOSIS — M6281 Muscle weakness (generalized): Secondary | ICD-10-CM | POA: Insufficient documentation

## 2023-08-03 DIAGNOSIS — R269 Unspecified abnormalities of gait and mobility: Secondary | ICD-10-CM | POA: Insufficient documentation

## 2023-08-03 DIAGNOSIS — R2681 Unsteadiness on feet: Secondary | ICD-10-CM | POA: Diagnosis not present

## 2023-08-03 DIAGNOSIS — R2689 Other abnormalities of gait and mobility: Secondary | ICD-10-CM | POA: Diagnosis not present

## 2023-08-03 NOTE — Therapy (Signed)
 OUTPATIENT PHYSICAL THERAPY NEURO TREATMENT/10TH VISIT PN  Patient Name: [name] MRN: [id number] DOB:1955-10-06, 68 y.o., female Today's Date: 08/03/2023   PCP: Charlett Apolinar POUR, MD  REFERRING PROVIDER: Tonuzi, Lirim, MD  10th Visit Physical Therapy Progress Note  Dates of Reporting Period: 06/08/23 to 08/03/23     END OF SESSION:  PT End of Session - 08/03/23 1319     Visit Number 10    Number of Visits 17    Date for PT Re-Evaluation 08/07/23    Authorization Type [employer]    PT Start Time 1318    PT Stop Time 1358    PT Time Calculation (min) 40 min    Equipment Utilized During Treatment Gait belt    Activity Tolerance Patient tolerated treatment well    Behavior During Therapy WFL for tasks assessed/performed                 Past Medical History:  Diagnosis Date   Ambulates with cane 04/08/2021   prn occ walker prn   Arthritis    left knee   Diverticulosis of colon    GERD (gastroesophageal reflux disease)    History of concussion 2019   no residual   Hypertension    Multiple sclerosis, relapsing-remitting (HCC) 2007   neurologist--- dr sharri magyar ([facility]-- [place])   Neurogenic bladder    Self-catheterizes urinary bladder 04/08/2021   self caths 3 to 4 x daily   Sjogren's syndrome (HCC) 10/23/2021   runs vaporizer in room @ night to help with   Torn ligament 04/01/2022   left knee   Urge incontinence    Uses walker 04/08/2021   prn   Varicose veins of both lower extremities    Past Surgical History:  Procedure Laterality Date   BOTOX  INJECTION N/A 04/10/2021   Procedure: BOTOX  INJECTION WITH CYSTOSCOPY;  Surgeon: Devere Lonni Righter, MD;  Location: [facility];  Service: Urology;  Laterality: N/A;   BOTOX  INJECTION N/A 10/25/2021   Procedure: CYSTOSCOPY WITH BOTOX  INJECTION 300 UNITS;  Surgeon: Devere Lonni Righter, MD;  Location: [facility];  Service:  Urology;  Laterality: N/A;   BOTOX  INJECTION N/A 04/09/2022   Procedure: BOTOX  INJECTION 300 units WITH CYSTOSCOPY;  Surgeon: Devere Lonni Righter, MD;  Location: [facility];  Service: Urology;  Laterality: N/A;   BOTOX  INJECTION N/A 10/15/2022   Procedure: BOTOX   300 UNITSINJECTION;  Surgeon: Devere Lonni Righter, MD;  Location: [facility];  Service: Urology;  Laterality: N/A;   CATARACT EXTRACTION W/ INTRAOCULAR LENS IMPLANT Left 2022   COLONOSCOPY WITH PROPOFOL   01/10/2015   dr d. brodie   CYSTOSCOPY WITH INJECTION N/A 10/15/2022   Procedure: CYSTOSCOPY WITH INJECTION;  Surgeon: Devere Lonni Righter, MD;  Location: [facility];  Service: Urology;  Laterality: N/A;  30 MINS   CYSTOSCOPY WITH INJECTION N/A 04/24/2023   Procedure: CYSTOSCOPY WITH BOTOX  INJECTION 300 UNITS;  Surgeon: Devere Lonni Righter, MD;  Location: [facility];  Service: Urology;  Laterality: N/A;   SHOULDER ARTHROSCOPY WITH ROTATOR CUFF REPAIR AND SUBACROMIAL DECOMPRESSION  06/01/2012   Procedure: SHOULDER ARTHROSCOPY WITH ROTATOR CUFF REPAIR AND SUBACROMIAL DECOMPRESSION;  Surgeon: Lynwood SHAUNNA Bern, MD;  Location: [facility];  Service: Orthopedics;  Laterality: Left;  LEFT SHOULDER ARTHROSCOPY WITH SUBACROMIAL DECOMPRESION AND DEBRIDEMENT OF PARTIAL ROTATOR CUFF TEAR   TONSILLECTOMY AND ADENOIDECTOMY  1962   TOTAL VAGINAL HYSTERECTOMY  12/01/2006   @  [facility] by dr m. mat;   W/ ANTERIOR & POSTERIOR REPAIR AND SACROPINOUS LIGAMENT FIXATION OF VAGINAL VAULT   Patient Active Problem List   Diagnosis Date Noted   Positive ANA (antinuclear antibody) 11/28/2021   Discoloration of skin of toe 11/28/2021   Rectal bleeding 12/05/2016   Internal hemorrhoids 12/05/2016   Hx of falling 03/17/2016   Nausea alone 02/13/2014   Other malaise and fatigue 02/13/2014   Postmenopausal estrogen deficiency 02/24/2013   Visit for preventive  health examination 02/24/2013   Family hx osteoporosis 02/21/2013   Recurrent UTI 01/09/2012   Suprapubic tenderness 12/01/2011   GERD (gastroesophageal reflux disease) 10/15/2011   Dysphagia, pharyngoesophageal 10/15/2011   Voiding dysfunction 09/24/2011   Hypertriglyceridemia 02/09/2011   Vitamin D  deficiency 12/13/2008   Constipation 12/13/2008   MUSCLE SPASM 12/13/2008   DIZZINESS 12/13/2008   HYPOKALEMIA, HX OF 12/13/2008   DISORDER, ADJUSTMENT W/DEPRESSED MOOD 04/30/2007   SCLEROSIS, MULTIPLE 04/30/2007   Essential hypertension 04/30/2007   VARICOSE VEIN, LWR EXTREMITIES W/INFLAMMATION 04/30/2007   Allergic rhinitis 04/30/2007    ONSET DATE: 05/22/2023  REFERRING DIAG: G35 (ICD-10-CM) - Multiple sclerosis R26.89 (ICD-10-CM) - Other abnormalities of gait and mobility  THERAPY DIAG:  Unsteadiness on feet  Other abnormalities of gait and mobility  Muscle weakness (generalized)   Rationale for Evaluation and Treatment: Rehabilitation  SUBJECTIVE:                                                                                                                                                                                             SUBJECTIVE STATEMENT:  Reports she had a good cruise. Was sick last week and feeling better today. Nothing else new. Did a lot of walking. No dizziness. Feels so much more stable. Wants to try dry needling to her L rhomboid. Got a theracane, haven't had the chance to use it recently. Went for her infusion last Tuesday.   Pt accompanied by: self  PERTINENT HISTORY: MS relapsing-remitting, arthritis, hypertension   PAIN:  Are you having pain? Notes L shoulder/rhomboid is bothersome a little bit.   PRECAUTIONS: Fall  RED FLAGS: None   WEIGHT BEARING RESTRICTIONS: No  FALLS: Has patient fallen in last 6 months?  Last fall was one year ago, many near falls in the last 6 months   LIVING ENVIRONMENT: Lives with: lives with their  spouse Lives in: House/apartment Stairs: Yes: External: 4 steps; on right going up Has following equipment at home: Single point cane and Walker - 4 wheeled  PLOF: Independent with basic ADLs and Independent with household mobility with device  PATIENT GOALS: stand on my own  and not panic about falling   OBJECTIVE:  Note: Objective measures were completed at Evaluation unless otherwise noted.  DIAGNOSTIC FINDINGS: 10/09/2022 MR Lumbar Spine  IMPRESSION: 1. Chronic L1 vertebral body compression fracture with mild marrow edema along the anterior superior corner and approximately 60% height loss. 2. No acute osseous injury of the lumbar spine. 3. Mild lumbar spine spondylosis as described above.  COGNITION: Overall cognitive status: Within functional limits for tasks assessed   SENSATION: WFL   POSTURE: rounded shoulders, forward head, decreased lumbar lordosis, and increased thoracic kyphosis   LOWER EXTREMITY MMT:    MMT Right Eval Left Eval  Hip flexion 3 4  Hip extension    Hip abduction 3 3  Hip adduction 4 4  Hip internal rotation    Hip external rotation    Knee flexion 4 5  Knee extension 4 5  Ankle dorsiflexion 4 4  Ankle plantarflexion 4 4  Ankle inversion    Ankle eversion    (Blank rows = not tested)     TODAY'S TREATMENT:       Therapeutic Activity:  Vitals:   08/03/23 1330  BP: (!) 142/83  Pulse: 85    Seated BP assessed at beginning of session in LUE  Discussed POC going forwards: pt agreeable to D/C at following session due to progress  Pt wanting to try dry needling for her L rhomboid area, re-scheduled last appt to be with [place], PT who is dry needling certified  Discussed if pt has a return of BPPV in the future or a worsening in her balance, then can get a new referral to return to PT   ABC Scale: 55% confident   5x sit <> stand: 28 seconds with BUE support from standard chair   On air ex: 10 reps sit <> stands from mat table  with pt using BUE support to stand, with cues to sit down without UE support with focus on slowed pace    [facility] PT Assessment - 08/03/23 0001       Berg Balance Test   Sit to Stand Able to stand  independently using hands    Standing Unsupported Able to stand safely 2 minutes    Sitting with Back Unsupported but Feet Supported on Floor or Stool Able to sit safely and securely 2 minutes    Stand to Sit Sits safely with minimal use of hands    Transfers Able to transfer safely, definite need of hands    Standing Unsupported with Eyes Closed Able to stand 10 seconds safely    Standing Unsupported with Feet Together Able to place feet together independently and stand 1 minute safely    From Standing, Reach Forward with Outstretched Arm Can reach confidently >25 cm (10)    From Standing Position, Pick up Object from Floor Able to pick up shoe, needs supervision    From Standing Position, Turn to Look Behind Over each Shoulder Looks behind from both sides and weight shifts well    Turn 360 Degrees Able to turn 360 degrees safely but slowly   6.6 seconds to both sides   Standing Unsupported, Alternately Place Feet on Step/Stool Able to complete >2 steps/needs minimal assist    Standing Unsupported, One Foot in Front Able to take small step independently and hold 30 seconds    Standing on One Leg Tries to lift leg/unable to hold 3 seconds but remains standing independently   Standing on LLE   Total Score 43  PATIENT EDUCATION:  Education details: continue HEP,  results of goals, plan for D/C at next session, getting pt scheduled for dry needling at next session.  Person educated: Patient Education method: Explanation, Demonstration, Tactile cues, and Verbal cues Education comprehension: verbalized understanding, returned demonstration, verbal cues required, tactile cues required, and needs further education  HOME EXERCISE PROGRAM:  Access Code: [email or web address] URL:  [email or web address] Date: 06/17/2023 Prepared by: Sheffield Senate  Exercises - Sit to Stand with Armchair  - 2 x daily - 5 x weekly - 2 sets - 5 reps - Heel Toe Raises with Counter Support  - 1-2 x daily - 5 x weekly - 2 sets - 10 reps - Standing Single Leg Stance with Counter Support  - 1-2 x daily - 5 x weekly - 3 sets - 10-15 hold - Standing March with Counter Support  - 1-2 x daily - 5 x weekly - 1-2 sets - 10 reps - Standing Hip Abduction with Counter Support  - 1-2 x daily - 5 x weekly - 2 sets - 10 reps - Standing Hip Extension with Counter Support  - 1 x daily - 7 x weekly - 3 sets - 10 reps - Side Stepping with Counter Support  - 1 x daily - 7 x weekly - 3 sets - 10 reps - Backward Walking with Counter Support  - 1 x daily - 7 x weekly - 3 sets - 10 reps - Side Stepping with Resistance at Ankles and Counter Support  - 1 x daily - 7 x weekly - 1 sets - 3 reps - Forward and Backward Monster Walk with Resistance at Ankles and Counter Support  - 1 x daily - 7 x weekly - 1 sets - 3 reps  GOALS: Goals reviewed with patient? Yes  SHORT TERM GOALS: Target date: 07/10/23  Pt will be independent with initial HEP for improved balance and decreased fear of falling.  Baseline: pt has been performing, but still challenged with R leg   Goal status: MET  2.  ABC goal Baseline: 36.25% confidence, LTG written  Goal status: MET  3.  Pt will improve 5x STS to </= 25 sec to demo improved functional LE strength and transfers.  Baseline: 28.25 sec  24.6 seconds on 07/08/23 Goal status: MET  4.  Pt will improve Berg score to 42 to demonstrate improved balance with standing and weight shifting and decreased fall risk.  Baseline: 36; 41/56 on 12/11  Goal status: PARTIALLY MET    LONG TERM GOALS: Target date: 08/07/23  Pt will be independent with final HEP for improved balance and decreased fear of falling.  Baseline: to be given Goal status: INITIAL  2.   Pt will improve  ABC scale goal to at least 45% in order to demo improved confidence.  Baseline: 36.25% confidence  55% confidence (1/6) Goal status: MET   3.  Pt will improve 5x STS to </= 22 sec to demo improved functional LE strength and transfers.  Baseline: 28.25 sec  28 seconds with BUE support (08/03/23) Goal status: NOT MET  4.  Pt will improve Berg score to 45 to demonstrate improved balance with standing and weight shifting and decreased fall risk.  Baseline: 36  43/56 (08/03/23) Goal status: PARTIALLY MET   5.  Pt will demo negative L posterior canal BPPV in order to demo decr dizziness with daily life.  Baseline: NEGATIVE for BPPV, pt with no dizziness  Goal status: MET  ASSESSMENT:  CLINICAL IMPRESSION:  10th visit PN: Today's skilled session focused on assessing LTGs. Pt has met LTGs #2 and #5. Pt improved ABC scale to 55% confidence with her balance (previously was 36.25%). Pt also negative for BPPV and pt has not had any dizziness. Pt partially met LTG #4, improve BERG to a 43/56, but not quite to goal level. Pt did not meet goal in regards to sit <> stands, performed in 28 seconds today with BUE support (previously was also 28 seconds), but pt does report improvements with sit <> stands. Due to progress, pt in agreement to D/C at next session. Pt also wants to try dry needling to L rhomboid area, will trial at next session. Will continue per POC.    OBJECTIVE IMPAIRMENTS: Abnormal gait, decreased balance, decreased coordination, decreased mobility, difficulty walking, and decreased strength.   ACTIVITY LIMITATIONS: carrying, lifting, bending, standing, stairs, locomotion level, and caring for others  PARTICIPATION LIMITATIONS: meal prep, cleaning, laundry, shopping, and community activity  PERSONAL FACTORS: Age, Time since onset of injury/illness/exacerbation, and Transportation are also affecting patient's functional outcome.   REHAB POTENTIAL: Good  CLINICAL DECISION MAKING:  Stable/uncomplicated  EVALUATION COMPLEXITY: Low  PLAN:  PT FREQUENCY: 1-2x/week  PT DURATION: 8 weeks  PLANNED INTERVENTIONS: [id number]- PT Re-evaluation, 97110-Therapeutic exercises, 97530- Therapeutic activity, [id number]- Neuromuscular re-education, 97535- Self Care, [id number]- Manual therapy, [id number]- Gait training, [phone]- Orthotic Fit/training, [phone]- Canalith repositioning, [phone]- Aquatic Therapy, Patient/Family education, Balance training, Stair training, Dry Needling, Vestibular training, and DME instructions  PLAN FOR NEXT SESSION: try dry needling, review/finalize HEP and D/C!   Sheffield Senate, PT, DPT 08/03/23 2:26 PM

## 2023-08-04 ENCOUNTER — Telehealth (INDEPENDENT_AMBULATORY_CARE_PROVIDER_SITE_OTHER): Payer: Medicare Other | Admitting: Internal Medicine

## 2023-08-04 VITALS — Temp 97.3°F

## 2023-08-04 DIAGNOSIS — J019 Acute sinusitis, unspecified: Secondary | ICD-10-CM

## 2023-08-04 DIAGNOSIS — T887XXA Unspecified adverse effect of drug or medicament, initial encounter: Secondary | ICD-10-CM | POA: Diagnosis not present

## 2023-08-04 DIAGNOSIS — J40 Bronchitis, not specified as acute or chronic: Secondary | ICD-10-CM

## 2023-08-04 MED ORDER — AZITHROMYCIN 250 MG PO TABS: ORAL_TABLET | ORAL | 0 refills | Status: AC

## 2023-08-04 NOTE — Progress Notes (Signed)
 Virtual Visit via Video Note  I connected with [name] on 08/04/23 at  2:00 PM EST by a video enabled telemedicine application and verified that I am speaking with the correct person using two identifiers. Location patient: home Location provider:work office Persons participating in the virtual visit: patient, provider   Patient aware  of the limitations of evaluation and management by telemedicine and  availability of in person appointments. and agreed to proceed.   HPI: [name] presents for video visit See chief complaint   Chief Complaint  Patient presents with   Sinus Problem    Pt reports she is still having sinus issues after taking amoxillicin. Pt states she did not complete it as it causes her to have diarrhea.  Sx started on 07/24/2023. With Sinus drainage, cough, sore throat, teeth pain, facial pain. Developed fever on the following Monday. Went to see [facility] on Monday 07/27/2023. Currently, pt reports lots of yellow discharge when blowing nose, fatigue, cough- productive white phlegm. Taking robutussin DM    Had virtual visit with [facility]  after 4 days of  uri sinus sx  and   then began Augmentin   and after a few days developed  diarrhea and had to stop  face pain isi better and no more fever but drainage is still bright orange thick and not seen before . Cough loose but no sob .  Went back on low dose keflex  for suppression in interim  No new  sx change  Ms and bladder  issues are stable  self caths and on  antibiotic suppression ROS: See pertinent positives and negatives per HPI.  Past Medical History:  Diagnosis Date   Ambulates with cane 04/08/2021   prn occ walker prn   Arthritis    left knee   Diverticulosis of colon    GERD (gastroesophageal reflux disease)    History of concussion 2019   no residual   Hypertension    Multiple sclerosis, relapsing-remitting (HCC) 2007   neurologist--- dr sharri magyar ([facility] [place]   Neurogenic bladder    Self-catheterizes urinary bladder 04/08/2021   self caths 3 to 4 x daily   Sjogren's syndrome (HCC) 10/23/2021   runs vaporizer in room @ night to help with   Torn ligament 04/01/2022   left knee   Urge incontinence    Uses walker 04/08/2021   prn   Varicose veins of both lower extremities     Past Surgical History:  Procedure Laterality Date   BOTOX  INJECTION N/A 04/10/2021   Procedure: BOTOX  INJECTION WITH CYSTOSCOPY;  Surgeon: Devere Lonni Righter, MD;  Location: [facility];  Service: Urology;  Laterality: N/A;   BOTOX  INJECTION N/A 10/25/2021   Procedure: CYSTOSCOPY WITH BOTOX  INJECTION 300 UNITS;  Surgeon: Devere Lonni Righter, MD;  Location: [facility];  Service: Urology;  Laterality: N/A;   BOTOX  INJECTION N/A 04/09/2022   Procedure: BOTOX  INJECTION 300 units WITH CYSTOSCOPY;  Surgeon: Devere Lonni Righter, MD;  Location: [facility];  Service: Urology;  Laterality: N/A;   BOTOX  INJECTION N/A 10/15/2022   Procedure: BOTOX   300 UNITSINJECTION;  Surgeon: Devere Lonni Righter, MD;  Location: [facility];  Service: Urology;  Laterality: N/A;   CATARACT EXTRACTION W/ INTRAOCULAR LENS IMPLANT Left 2022   COLONOSCOPY WITH PROPOFOL   01/10/2015   dr d. brodie   CYSTOSCOPY WITH INJECTION N/A 10/15/2022   Procedure: CYSTOSCOPY WITH INJECTION;  Surgeon:  Devere Lonni Righter, MD;  Location: [facility];  Service: Urology;  Laterality: N/A;  30 MINS   CYSTOSCOPY WITH INJECTION N/A 04/24/2023   Procedure: CYSTOSCOPY WITH BOTOX  INJECTION 300 UNITS;  Surgeon: Devere Lonni Righter, MD;  Location: [facility];  Service: Urology;  Laterality: N/A;   SHOULDER ARTHROSCOPY WITH ROTATOR CUFF REPAIR AND SUBACROMIAL DECOMPRESSION  06/01/2012   Procedure: SHOULDER ARTHROSCOPY WITH ROTATOR CUFF REPAIR AND SUBACROMIAL DECOMPRESSION;  Surgeon:  Lynwood SHAUNNA Bern, MD;  Location: [facility];  Service: Orthopedics;  Laterality: Left;  LEFT SHOULDER ARTHROSCOPY WITH SUBACROMIAL DECOMPRESION AND DEBRIDEMENT OF PARTIAL ROTATOR CUFF TEAR   TONSILLECTOMY AND ADENOIDECTOMY  1962   TOTAL VAGINAL HYSTERECTOMY  12/01/2006   @[facility]  by dr m. mat;   W/ ANTERIOR & POSTERIOR REPAIR AND SACROPINOUS LIGAMENT FIXATION OF VAGINAL VAULT    Family History  Problem Relation Age of Onset   Lung cancer Father    Colon cancer Neg Hx     Social History   Tobacco Use   Smoking status: Never    Passive exposure: Never   Smokeless tobacco: Never  Vaping Use   Vaping status: Never Used  Substance Use Topics   Alcohol use: Yes    Comment: 2 glasses wine per day   Drug use: No      Current Outpatient Medications:    amLODipine  (NORVASC ) 2.5 MG tablet, TAKE 1 TABLET BY MOUTH DAILY, Disp: 90 tablet, Rfl: 3   Ascorbic Acid  500 MG CAPS, Take 500 mg by mouth at bedtime., Disp: , Rfl:    azithromycin  (ZITHROMAX ) 250 MG tablet, Take 2 tablets on day 1, then 1 tablet daily on days 2 through 5, Disp: 6 tablet, Rfl: 0   Biotin  10000 MCG TABS, Take 1 tablet by mouth daily at 6 (six) AM. 1000 mg daily, Disp: , Rfl:    Calcium  Citrate-Vitamin D  (CALCIUM  CITRATE+D3 PO), Take 600 mg by mouth daily., Disp: , Rfl:    cephALEXin  (KEFLEX ) 250 MG capsule, Take 250 mg by mouth at bedtime., Disp: , Rfl:    Cholecalciferol  (VITAMIN D3) 50 MCG (2000 UT) capsule, Take 2,000 Units by mouth daily., Disp: , Rfl:    CRANBERRY PO, Take 1 capsule by mouth in the morning and at bedtime. 500 mg, Disp: , Rfl:    Cyanocobalamin  (VITAMIN B 12) 500 MCG TABS, Take 1,000 mcg by mouth daily., Disp: 30 tablet, Rfl: 11   D-MANNOSE PO, Take 1 capsule by mouth in the morning and at bedtime., Disp: , Rfl:    DULoxetine  (CYMBALTA ) 60 MG capsule, Take 2 capsules by mouth at bedtime., Disp: , Rfl:    esomeprazole  (NEXIUM ) 20 MG capsule, Take 20 mg by mouth daily., Disp: , Rfl:     fluticasone  (FLONASE ) 50 MCG/ACT nasal spray, Place 1 spray into both nostrils daily., Disp: , Rfl:    gabapentin  (NEURONTIN ) 300 MG capsule, Take 300 mg by mouth at bedtime. Takes 2 caps a day., Disp: , Rfl:    ibuprofen  (ADVIL ) 200 MG tablet, Take 600 mg by mouth 3 (three) times a week. 3 x weekly takes bid, Disp: , Rfl:    losartan  (COZAAR ) 25 MG tablet, TAKE 1 TABLET BY MOUTH DAILY FOR HIGH BLOOD PRESSURE INSTEAD OF VALSARTAN , Disp: 90 tablet, Rfl: 1   Meth-Hyo-M Bl-Na Phos-Ph Sal (URIBEL) 118 MG CAPS, Take 1 capsule by mouth daily as needed (bladder spasms). Using PRN, Disp: , Rfl:    natalizumab  (TYSABRI ) 300 MG/15ML injection,  Inject into the vein every 6 (six) weeks. Every  6 weeks, Disp: , Rfl:    Probiotic Product (PROBIOTIC PEARLS) CAPS, Take 1 capsule by mouth in the morning and at bedtime., Disp: , Rfl:    White Petrolatum -Mineral Oil (SYSTANE NIGHTTIME) OINT, Apply to eye at bedtime., Disp: , Rfl:    zoledronic  acid (RECLAST ) 5 MG/100ML SOLN injection, Inject 5 mg into the vein once., Disp: , Rfl:   EXAM: BP Readings from Last 3 Encounters:  08/03/23 (!) 142/83  07/16/23 (!) 159/85  07/10/23 (!) 150/89    VITALS per patient if applicable: congested but no facial edema and nad  loose cough   GENERAL: alert, oriented, appears well and in no acute distress  HEENT: atraumatic, conjunttiva clear, no obvious abnormalities on inspection of external nose and ears  NECK: normal movements of the head and neck  LUNGS: on inspection no signs of respiratory distress, breathing rate appears normal, no obvious gross SOB, gasping or wheezing nl speech   CV: no obvious cyanosis  PSYCH/NEURO: pleasant and cooperative, no obvious depression or anxiety, speech and thought processing grossly intact Lab Results  Component Value Date   WBC 8.9 07/22/2022   HGB 11.9 (L) 04/24/2023   HCT 35.0 (L) 04/24/2023   PLT 208 07/22/2022   GLUCOSE 96 04/24/2023   CHOL 172 04/24/2021   TRIG 112.0  04/24/2021   HDL 55.80 04/24/2021   LDLDIRECT 110.0 04/23/2015   LDLCALC 94 04/24/2021   ALT 20 09/24/2021   AST 17 09/24/2021   NA 139 04/24/2023   K 4.3 04/24/2023   CL 102 04/24/2023   CREATININE 0.60 04/24/2023   BUN 16 04/24/2023   CO2 20 (L) 07/22/2022   TSH 1.12 04/24/2021   HGBA1C 5.2 02/13/2014    ASSESSMENT AND PLAN:  Discussed the following assessment and plan:    ICD-10-CM   1. Medication side effect  T88.7XXA    diarrhea on augmentin     2. Acute sinusitis, recurrence not specified, unspecified location  J01.90    partly rx but no improved with days of augmentin  primary was prob viral    3. Bronchitis  J40    no alarm features    Partly rx sinusitis and se of med  initial  may be viral and now some sx of secondary findings infection  Other antibiotic options  Chief Complaint  Patient presents with   Sinus Problem    Pt reports she is still having sinus issues after taking amoxillicin. Pt states she did not complete it as it causes her to have diarrhea.  Sx started on 07/24/2023. With Sinus drainage, cough, sore throat, teeth pain, facial pain. Developed fever on the following Monday. Went to see [facility] on Monday 07/27/2023. Currently, pt reports lots of yellow discharge when blowing nose, fatigue, cough- productive white phlegm. Taking robutussin DM    HPI: [name] 69 y.o. comes in today for Preventive Medicare exam/ wellness visit .Since last visit.  Health Maintenance  Topic Date Due   INFLUENZA VACCINE  02/26/2023   COVID-19 Vaccine (5 - 2024-25 season) 03/29/2023   Medicare Annual Wellness (AWV)  09/19/2023   MAMMOGRAM  05/13/2024   Colonoscopy  01/09/2025   DTaP/Tdap/Td (3 - Td or Tdap) 07/11/2025   Pneumonia Vaccine 10+ Years old  Completed   DEXA SCAN  Completed   Zoster Vaccines- Shingrix  Completed   HPV VACCINES  Aged Out   Hepatitis C Screening  Discontinued   Health  Maintenance Review LIFESTYLE:  Exercise:    Tobacco/ETS: Alcohol:  Sugar beverages: Sleep: Drug use: no HH:     Hearing:  Vision:  No limitations at present . Last eye check UTD Safety:  Has smoke detector and wears seat belts.  No firearms. No excess sun exposure. Sees dentist regularly. Falls:  Advance directive :  Reviewed  Has one. Memory: Felt to be good  , no concern from her or her family. Depression: No anhedonia unusual crying or depressive symptoms Nutrition: Eats well balanced diet; adequate calcium  and vitamin D . No swallowing chewing problems. Injury: no major injuries in the last six months. Other healthcare providers:  Reviewed today . Social:  Lives with spouse married. No pets.  Preventive parameters: up-to-date  Reviewed  ADLS:   There are no problems or need for assistance  driving, feeding, obtaining food, dressing, toileting and bathing, managing money using phone. She is independent. EXERCISE/ HABITS  Per week   No tobacco    etoh   ROS:  GEN/ HEENT: No fever, significant weight changes sweats headaches vision problems hearing changes, CV/ PULM; No chest pain shortness of breath cough, syncope,edema  change in exercise tolerance. GI /GU: No adominal pain, vomiting, change in bowel habits. No blood in the stool. No significant GU symptoms. SKIN/HEME: ,no acute skin rashes suspicious lesions or bleeding. No lymphadenopathy, nodules, masses.  NEURO/ PSYCH:  No neurologic signs such as weakness numbness. No depression anxiety. IMM/ Allergy: No unusual infections.  Allergy .   REST of 12 system review negative except as per HPI   Past Medical History:  Diagnosis Date   Ambulates with cane 04/08/2021   prn occ walker prn   Arthritis    left knee   Diverticulosis of colon    GERD (gastroesophageal reflux disease)    History of concussion 2019   no residual   Hypertension    Multiple sclerosis, relapsing-remitting (HCC) 2007   neurologist--- dr sharri magyar ([facility]-- [place])    Neurogenic bladder    Self-catheterizes urinary bladder 04/08/2021   self caths 3 to 4 x daily   Sjogren's syndrome (HCC) 10/23/2021   runs vaporizer in room @ night to help with   Torn ligament 04/01/2022   left knee   Urge incontinence    Uses walker 04/08/2021   prn   Varicose veins of both lower extremities     Family History  Problem Relation Age of Onset   Lung cancer Father    Colon cancer Neg Hx     Social History   Socioeconomic History   Marital status: Married    Spouse name: Not on file   Number of children: 4   Years of education: Not on file   Highest education level: Bachelor's degree (e.g., BA, AB, BS)  Occupational History   Occupation: Disability  Tobacco Use   Smoking status: Never    Passive exposure: Never   Smokeless tobacco: Never  Vaping Use   Vaping status: Never Used  Substance and Sexual Activity   Alcohol use: Yes    Comment: 2 glasses wine per day   Drug use: No   Sexual activity: Not on file  Other Topics Concern   Not on file  Social History Narrative   Unable to volunteer    Now on disability   Children now out of home   Daily caffeine    Social Drivers of Health   Financial Resource Strain: Low Risk  (08/04/2023)  Overall Financial Resource Strain (CARDIA)    Difficulty of Paying Living Expenses: Not hard at all  Food Insecurity: No Food Insecurity (08/04/2023)   Hunger Vital Sign    Worried About Running Out of Food in the Last Year: Never true    Ran Out of Food in the Last Year: Never true  Transportation Needs: No Transportation Needs (08/04/2023)   PRAPARE - Administrator, Civil Service (Medical): No    Lack of Transportation (Non-Medical): No  Physical Activity: Insufficiently Active (08/04/2023)   Exercise Vital Sign    Days of Exercise per Week: 2 days    Minutes of Exercise per Session: 30 min  Stress: No Stress Concern Present (08/04/2023)   [employer] of Occupational Health - Occupational Stress  Questionnaire    Feeling of Stress : Not at all  Social Connections: Socially Integrated (08/04/2023)   Social Connection and Isolation Panel [NHANES]    Frequency of Communication with Friends and Family: Twice a week    Frequency of Social Gatherings with Friends and Family: Once a week    Attends Religious Services: More than 4 times per year    Active Member of [employer] or Organizations: Yes    Attends Banker Meetings: More than 4 times per year    Marital Status: Married    Outpatient Encounter Medications as of 08/04/2023  Medication Sig   amLODipine  (NORVASC ) 2.5 MG tablet TAKE 1 TABLET BY MOUTH DAILY   Ascorbic Acid  500 MG CAPS Take 500 mg by mouth at bedtime.   azithromycin  (ZITHROMAX ) 250 MG tablet Take 2 tablets on day 1, then 1 tablet daily on days 2 through 5   Biotin  10000 MCG TABS Take 1 tablet by mouth daily at 6 (six) AM. 1000 mg daily   Calcium  Citrate-Vitamin D  (CALCIUM  CITRATE+D3 PO) Take 600 mg by mouth daily.   cephALEXin  (KEFLEX ) 250 MG capsule Take 250 mg by mouth at bedtime.   Cholecalciferol  (VITAMIN D3) 50 MCG (2000 UT) capsule Take 2,000 Units by mouth daily.   CRANBERRY PO Take 1 capsule by mouth in the morning and at bedtime. 500 mg   Cyanocobalamin  (VITAMIN B 12) 500 MCG TABS Take 1,000 mcg by mouth daily.   D-MANNOSE PO Take 1 capsule by mouth in the morning and at bedtime.   DULoxetine  (CYMBALTA ) 60 MG capsule Take 2 capsules by mouth at bedtime.   esomeprazole  (NEXIUM ) 20 MG capsule Take 20 mg by mouth daily.   fluticasone  (FLONASE ) 50 MCG/ACT nasal spray Place 1 spray into both nostrils daily.   gabapentin  (NEURONTIN ) 300 MG capsule Take 300 mg by mouth at bedtime. Takes 2 caps a day.   ibuprofen  (ADVIL ) 200 MG tablet Take 600 mg by mouth 3 (three) times a week. 3 x weekly takes bid   losartan  (COZAAR ) 25 MG tablet TAKE 1 TABLET BY MOUTH DAILY FOR HIGH BLOOD PRESSURE INSTEAD OF VALSARTAN    Meth-Hyo-M Bl-Na Phos-Ph Sal (URIBEL) 118 MG CAPS Take 1  capsule by mouth daily as needed (bladder spasms). Using PRN   natalizumab  (TYSABRI ) 300 MG/15ML injection Inject into the vein every 6 (six) weeks. Every  6 weeks   Probiotic Product (PROBIOTIC PEARLS) CAPS Take 1 capsule by mouth in the morning and at bedtime.   White Petrolatum -Mineral Oil (SYSTANE NIGHTTIME) OINT Apply to eye at bedtime.   zoledronic  acid (RECLAST ) 5 MG/100ML SOLN injection Inject 5 mg into the vein once.   [DISCONTINUED] methocarbamol  (ROBAXIN ) 750 MG tablet Take  750 mg by mouth daily.   No facility-administered encounter medications on file as of 08/04/2023.    EXAM:  Temp (!) 97.3 F (36.3 C) (Oral)   There is no height or weight on file to calculate BMI.  Physical Exam: Vital signs reviewed HZW:Uypd is a well-developed well-nourished alert cooperative   who appears stated age in no acute distress.  HEENT: normocephalic atraumatic , Eyes: PERRL EOM's full, conjunctiva clear, Nares: paten,t no deformity discharge or tenderness., Ears: no deformity EAC's clear TMs with normal landmarks. Mouth: clear OP, no lesions, edema.  Moist mucous membranes. Dentition in adequate repair. NECK: supple without masses, thyromegaly or bruits. CHEST/PULM:  Clear to auscultation and percussion breath sounds equal no wheeze , rales or rhonchi. No chest wall deformities or tenderness. CV: PMI is nondisplaced, S1 S2 no gallops, murmurs, rubs. Peripheral pulses are full without delay.No JVD .  ABDOMEN: Bowel sounds normal nontender  No guard or rebound, no hepato splenomegal no CVA tenderness.   Extremtities:  No clubbing cyanosis or edema, no acute joint swelling or redness no focal atrophy NEURO:  Oriented x3, cranial nerves 3-12 appear to be intact, no obvious focal weakness,gait within normal limits no abnormal reflexes or asymmetrical SKIN: No acute rashes normal turgor, color, no bruising or petechiae. PSYCH: Oriented, good eye contact, no obvious depression anxiety, cognition and  judgment appear normal. LN: no cervical axillary inguinal adenopathy No noted deficits in memory, attention, and speech.   Lab Results  Component Value Date   WBC 8.9 07/22/2022   HGB 11.9 (L) 04/24/2023   HCT 35.0 (L) 04/24/2023   PLT 208 07/22/2022   GLUCOSE 96 04/24/2023   CHOL 172 04/24/2021   TRIG 112.0 04/24/2021   HDL 55.80 04/24/2021   LDLDIRECT 110.0 04/23/2015   LDLCALC 94 04/24/2021   ALT 20 09/24/2021   AST 17 09/24/2021   NA 139 04/24/2023   K 4.3 04/24/2023   CL 102 04/24/2023   CREATININE 0.60 04/24/2023   BUN 16 04/24/2023   CO2 20 (L) 07/22/2022   TSH 1.12 04/24/2021   HGBA1C 5.2 02/13/2014    ASSESSMENT AND PLAN:  Discussed the following assessment and plan:  Medication side effect - diarrhea on augmentin   Acute sinusitis, recurrence not specified, unspecified location - partly rx but no improved with days of augmentin  primary was prob viral  Bronchitis - no alarm features  Patient Care Team: Charlett Apolinar POUR, MD as PCP - Diedre Marigene Kerns, MD as Referring Physician (Obstetrics and Gynecology) Devere Lonni Righter, MD as Consulting Physician (Urology) Court Pulling, MD as Consulting Physician (Dermatology) Charlyne Helling, MD as Referring Physician (Neurology) Lionell Jon DEL, [facility] (Pharmacist)  There are no Patient Instructions on file for this visit.  Priti Consoli K. Sarinah Doetsch M.D.   discussed and she reports  z pack has been helpful and no se in past   Counseled.   Expectant management and discussion of plan and treatment with opportunity to ask questions and all were answered. The patient agreed with the plan and demonstrated an understanding of the instructions.   Advised to call back or seek an in-person evaluation if worsening  or having  further concerns  in interim. Return if symptoms worsen or fail to improve.    Apolinar Charlett, MD

## 2023-08-05 ENCOUNTER — Ambulatory Visit: Payer: Medicare Other | Admitting: Physical Therapy

## 2023-08-05 DIAGNOSIS — M6281 Muscle weakness (generalized): Secondary | ICD-10-CM | POA: Diagnosis not present

## 2023-08-05 DIAGNOSIS — F40298 Other specified phobia: Secondary | ICD-10-CM

## 2023-08-05 DIAGNOSIS — R2681 Unsteadiness on feet: Secondary | ICD-10-CM

## 2023-08-05 DIAGNOSIS — R269 Unspecified abnormalities of gait and mobility: Secondary | ICD-10-CM

## 2023-08-05 DIAGNOSIS — R2689 Other abnormalities of gait and mobility: Secondary | ICD-10-CM | POA: Diagnosis not present

## 2023-08-05 NOTE — Therapy (Signed)
 OUTPATIENT PHYSICAL THERAPY NEURO TREATMENT - DISCHARGE NOTE  Patient Name: [name] MRN: [id number] DOB:[birth year], 68 y.o., female Today's Date: 08/05/2023   PCP: Charlett Apolinar POUR, MD  REFERRING PROVIDER: Tonuzi, Lirim, MD  PHYSICAL THERAPY DISCHARGE SUMMARY  Visits from Start of Care: 11  Current functional level related to goals / functional outcomes: Mod I   Remaining deficits: Slightly impaired balance and functional LE strength   Education / Equipment: Handout for HEP, continue to use rollator   Patient agrees to discharge. Patient goals were partially met. Patient is being discharged due to being pleased with the current functional level.      END OF SESSION:  PT End of Session - 08/05/23 1529     Visit Number 11    Number of Visits 17    Date for PT Re-Evaluation 08/07/23    Authorization Type [employer]    PT Start Time 1527    PT Stop Time 1551    PT Time Calculation (min) 24 min    Equipment Utilized During Treatment --    Activity Tolerance Patient tolerated treatment well    Behavior During Therapy WFL for tasks assessed/performed                  Past Medical History:  Diagnosis Date   Ambulates with cane 04/08/2021   prn occ walker prn   Arthritis    left knee   Diverticulosis of colon    GERD (gastroesophageal reflux disease)    History of concussion 2019   no residual   Hypertension    Multiple sclerosis, relapsing-remitting (HCC) 2007   neurologist--- dr sharri ronuzi ([facility] [place])   Neurogenic bladder    Self-catheterizes urinary bladder 04/08/2021   self caths 3 to 4 x daily   Sjogren's syndrome (HCC) 10/23/2021   runs vaporizer in room @ night to help with   Torn ligament 04/01/2022   left knee   Urge incontinence    Uses walker 04/08/2021   prn   Varicose veins of both lower extremities    Past Surgical History:  Procedure Laterality Date   BOTOX  INJECTION N/A  04/10/2021   Procedure: BOTOX  INJECTION WITH CYSTOSCOPY;  Surgeon: Devere Lonni Righter, MD;  Location: [facility];  Service: Urology;  Laterality: N/A;   BOTOX  INJECTION N/A 10/25/2021   Procedure: CYSTOSCOPY WITH BOTOX  INJECTION 300 UNITS;  Surgeon: Devere Lonni Righter, MD;  Location: [facility];  Service: Urology;  Laterality: N/A;   BOTOX  INJECTION N/A 04/09/2022   Procedure: BOTOX  INJECTION 300 units WITH CYSTOSCOPY;  Surgeon: Devere Lonni Righter, MD;  Location: [facility];  Service: Urology;  Laterality: N/A;   BOTOX  INJECTION N/A 10/15/2022   Procedure: BOTOX   300 UNITSINJECTION;  Surgeon: Devere Lonni Righter, MD;  Location[facility];  Service: Urology;  Laterality: N/A;   CATARACT EXTRACTION W/ INTRAOCULAR LENS IMPLANT Left 2022   COLONOSCOPY WITH PROPOFOL   01/10/2015   dr d. brodie   CYSTOSCOPY WITH INJECTION N/A 10/15/2022   Procedure: CYSTOSCOPY WITH INJECTION;  Surgeon: Devere Lonni Righter, MD;  Location: [facility];  Service: Urology;  Laterality: N/A;  30 MINS   CYSTOSCOPY WITH INJECTION N/A 04/24/2023   Procedure: CYSTOSCOPY WITH BOTOX  INJECTION 300 UNITS;  Surgeon: Devere Lonni Righter, MD;  Location: [facility];  Service: Urology;  Laterality: N/A;   SHOULDER ARTHROSCOPY WITH ROTATOR CUFF REPAIR AND SUBACROMIAL DECOMPRESSION  06/01/2012  Procedure: SHOULDER ARTHROSCOPY WITH ROTATOR CUFF REPAIR AND SUBACROMIAL DECOMPRESSION;  Surgeon: Lynwood SHAUNNA Bern, MD;  Location: [facility];  Service: Orthopedics;  Laterality: Left;  LEFT SHOULDER ARTHROSCOPY WITH SUBACROMIAL DECOMPRESION AND DEBRIDEMENT OF PARTIAL ROTATOR CUFF TEAR   TONSILLECTOMY AND ADENOIDECTOMY  1962   TOTAL VAGINAL HYSTERECTOMY  12/01/2006   @[facility]  by dr m. mat;   W/ ANTERIOR & POSTERIOR REPAIR AND SACROPINOUS LIGAMENT FIXATION OF VAGINAL VAULT   Patient Active Problem List    Diagnosis Date Noted   Positive ANA (antinuclear antibody) 11/28/2021   Discoloration of skin of toe 11/28/2021   Rectal bleeding 12/05/2016   Internal hemorrhoids 12/05/2016   Hx of falling 03/17/2016   Nausea alone 02/13/2014   Other malaise and fatigue 02/13/2014   Postmenopausal estrogen deficiency 02/24/2013   Visit for preventive health examination 02/24/2013   Family hx osteoporosis 02/21/2013   Recurrent UTI 01/09/2012   Suprapubic tenderness 12/01/2011   GERD (gastroesophageal reflux disease) 10/15/2011   Dysphagia, pharyngoesophageal 10/15/2011   Voiding dysfunction 09/24/2011   Hypertriglyceridemia 02/09/2011   Vitamin D  deficiency 12/13/2008   Constipation 12/13/2008   MUSCLE SPASM 12/13/2008   DIZZINESS 12/13/2008   HYPOKALEMIA, HX OF 12/13/2008   DISORDER, ADJUSTMENT W/DEPRESSED MOOD 04/30/2007   SCLEROSIS, MULTIPLE 04/30/2007   Essential hypertension 04/30/2007   VARICOSE VEIN, LWR EXTREMITIES W/INFLAMMATION 04/30/2007   Allergic rhinitis 04/30/2007    ONSET DATE: 05/22/2023  REFERRING DIAG: G35 (ICD-10-CM) - Multiple sclerosis R26.89 (ICD-10-CM) - Other abnormalities of gait and mobility  THERAPY DIAG:  Unsteadiness on feet  Other abnormalities of gait and mobility  Muscle weakness (generalized)  Abnormality of gait  Fear of falling  Impairment of balance   Rationale for Evaluation and Treatment: Rehabilitation  SUBJECTIVE:                                                                                                                                                                                             SUBJECTIVE STATEMENT:  Pt denies any falls or acute changes since last visit. Pt reports she is having no real pain but is aware of sore spot around R rhomboids. Pt agreeable to d/c today.  Pt accompanied by: self  PERTINENT HISTORY: MS relapsing-remitting, arthritis, hypertension   PAIN:  Are you having pain? Notes L  shoulder/rhomboid is bothersome a little bit.   PRECAUTIONS: Fall  RED FLAGS: None   WEIGHT BEARING RESTRICTIONS: No  FALLS: Has patient fallen in last 6 months?  Last fall was one year ago, many near falls in the last 6 months   LIVING ENVIRONMENT: Lives with: lives with their  spouse Lives in: House/apartment Stairs: Yes: External: 4 steps; on right going up Has following equipment at home: Single point cane and Walker - 4 wheeled  PLOF: Independent with basic ADLs and Independent with household mobility with device  PATIENT GOALS: stand on my own and not panic about falling   OBJECTIVE:  Note: Objective measures were completed at Evaluation unless otherwise noted.  DIAGNOSTIC FINDINGS: 10/09/2022 MR Lumbar Spine  IMPRESSION: 1. Chronic L1 vertebral body compression fracture with mild marrow edema along the anterior superior corner and approximately 60% height loss. 2. No acute osseous injury of the lumbar spine. 3. Mild lumbar spine spondylosis as described above.  COGNITION: Overall cognitive status: Within functional limits for tasks assessed   SENSATION: WFL   POSTURE: rounded shoulders, forward head, decreased lumbar lordosis, and increased thoracic kyphosis   LOWER EXTREMITY MMT:    MMT Right Eval Left Eval  Hip flexion 3 4  Hip extension    Hip abduction 3 3  Hip adduction 4 4  Hip internal rotation    Hip external rotation    Knee flexion 4 5  Knee extension 4 5  Ankle dorsiflexion 4 4  Ankle plantarflexion 4 4  Ankle inversion    Ankle eversion    (Blank rows = not tested)     TODAY'S TREATMENT:       Therapeutic Activity:    Trigger Point Dry Needling  Initial Treatment: Pt instructed on Dry Needling rational, procedures, and possible side effects. Pt instructed to expect mild to moderate muscle soreness later in the day and/or into the next day.  Pt instructed in methods to reduce muscle soreness. Pt instructed to continue  prescribed HEP. Because Dry Needling was performed over or adjacent to a lung field, pt was educated on S/S of pneumothorax and to seek immediate medical attention should they occur.  Patient was educated on signs and symptoms of infection and other risk factors and advised to seek medical attention should they occur.  Patient verbalized understanding of these instructions and education.   Patient Verbal Consent Given: Yes Education Handout Provided: Yes Muscles Treated: R rhomboids Electrical Stimulation Performed: No Treatment Response/Outcome: deep ache/pressure; muscle twitch detected  Trigger Point Dry Needling  What is Trigger Point Dry Needling (DN)? DN is a physical therapy technique used to treat muscle pain and dysfunction. Specifically, DN helps deactivate muscle trigger points (muscle knots).  A thin filiform needle is used to penetrate the skin and stimulate the underlying trigger point. The goal is for a local twitch response (LTR) to occur and for the trigger point to relax. No medication of any kind is injected during the procedure.   What Does Trigger Point Dry Needling Feel Like?  The procedure feels different for each individual patient. Some patients report that they do not actually feel the needle enter the skin and overall the process is not painful. Very mild bleeding may occur. However, many patients feel a deep cramping in the muscle in which the needle was inserted. This is the local twitch response.   How Will I feel after the treatment? Soreness is normal, and the onset of soreness may not occur for a few hours. Typically this soreness does not last longer than two days.  Bruising is uncommon, however; ice can be used to decrease any possible bruising.  In rare cases feeling tired or nauseous after the treatment is normal. In addition, your symptoms may get worse before they get better, this period will typically  not last longer than 24 hours.   What Can I do  After My Treatment? Increase your hydration by drinking more water  for the next 24 hours.  You may place ice or heat on the areas treated that have become sore, however, do not use heat on inflamed or bruised areas. Heat often brings more relief post needling. You can continue your regular activities, but vigorous activity is not recommended initially after the treatment for 24 hours. DN is best combined with other physical therapy such as strengthening, stretching, and other therapies.   What are the complications? While your therapist has had extensive training in minimizing the risks of trigger point dry needling, it is important to understand the risks of any procedure.  Risks include bleeding, pain, fatigue, hematoma, infection, vertigo, nausea or nerve involvement. Monitor for any changes to your skin or sensation. Contact your therapist or MD with concerns.  A rare but serious complication is a pneumothorax over or near your middle and upper chest and back If you have dry needling in this area, monitor for the following symptoms: Shortness of breath on exertion and/or Difficulty taking a deep breath and/or Chest Pain and/or A dry cough If any of the above symptoms develop, please go to the nearest emergency room or call 911. Tell them you had dry needling over your thorax and report any symptoms you are having. Please follow-up with your treating therapist after you complete the medical evaluation.      PATIENT EDUCATION:  Education details: continue HEP, TPDN (see above) Person educated: Patient Education method: Explanation and Handouts Education comprehension: verbalized understanding  HOME EXERCISE PROGRAM:  Access Code: [phone] URL: [email or web address] Date: 06/17/2023 Prepared by: Sheffield Senate  Exercises - Sit to Stand with Armchair  - 2 x daily - 5 x weekly - 2 sets - 5 reps - Heel Toe Raises with Counter Support  - 1-2 x daily - 5 x weekly - 2 sets  - 10 reps - Standing Single Leg Stance with Counter Support  - 1-2 x daily - 5 x weekly - 3 sets - 10-15 hold - Standing March with Counter Support  - 1-2 x daily - 5 x weekly - 1-2 sets - 10 reps - Standing Hip Abduction with Counter Support  - 1-2 x daily - 5 x weekly - 2 sets - 10 reps - Standing Hip Extension with Counter Support  - 1 x daily - 7 x weekly - 3 sets - 10 reps - Side Stepping with Counter Support  - 1 x daily - 7 x weekly - 3 sets - 10 reps - Backward Walking with Counter Support  - 1 x daily - 7 x weekly - 3 sets - 10 reps - Side Stepping with Resistance at Ankles and Counter Support  - 1 x daily - 7 x weekly - 1 sets - 3 reps - Forward and Backward Monster Walk with Resistance at Ankles and Counter Support  - 1 x daily - 7 x weekly - 1 sets - 3 reps  GOALS: Goals reviewed with patient? Yes  SHORT TERM GOALS: Target date: 07/10/23  Pt will be independent with initial HEP for improved balance and decreased fear of falling.  Baseline: pt has been performing, but still challenged with R leg   Goal status: MET  2.  ABC goal Baseline: 36.25% confidence, LTG written  Goal status: MET  3.  Pt will improve 5x STS to </= 25 sec to demo improved functional LE  strength and transfers.  Baseline: 28.25 sec  24.6 seconds on 07/08/23 Goal status: MET  4.  Pt will improve Berg score to 42 to demonstrate improved balance with standing and weight shifting and decreased fall risk.  Baseline: 36; 41/56 on 12/11  Goal status: PARTIALLY MET    LONG TERM GOALS: Target date: 08/07/23  Pt will be independent with final HEP for improved balance and decreased fear of falling.  Baseline: to be given Goal status: MET  2.   Pt will improve ABC scale goal to at least 45% in order to demo improved confidence.  Baseline: 36.25% confidence  55% confidence (1/6) Goal status: MET   3.  Pt will improve 5x STS to </= 22 sec to demo improved functional LE strength and transfers.  Baseline:  28.25 sec  28 seconds with BUE support (08/03/23) Goal status: NOT MET  4.  Pt will improve Berg score to 45 to demonstrate improved balance with standing and weight shifting and decreased fall risk.  Baseline: 36  43/56 (08/03/23) Goal status: PARTIALLY MET   5.  Pt will demo negative L posterior canal BPPV in order to demo decr dizziness with daily life.  Baseline: NEGATIVE for BPPV, pt with no dizziness  Goal status: MET  ASSESSMENT:  CLINICAL IMPRESSION: Emphasis of skilled PT session on assessing remaining LTG and performing TPDN to address ongoing pain in R rhomboids region. Pt has met remaining 1/1 LTG assessed this date due to being independent with her final HEP. Pt with good response to DN this date with decrease in pain/soreness following treatment. Pt agreeable to d/c from OPPT services this date and continue independently with her HEP at home.    OBJECTIVE IMPAIRMENTS: Abnormal gait, decreased balance, decreased coordination, decreased mobility, difficulty walking, and decreased strength.   ACTIVITY LIMITATIONS: carrying, lifting, bending, standing, stairs, locomotion level, and caring for others  PARTICIPATION LIMITATIONS: meal prep, cleaning, laundry, shopping, and community activity  PERSONAL FACTORS: Age, Time since onset of injury/illness/exacerbation, and Transportation are also affecting patient's functional outcome.   REHAB POTENTIAL: Good  CLINICAL DECISION MAKING: Stable/uncomplicated  EVALUATION COMPLEXITY: Low    Waddell Southgate, PT, DPT, CSRS  08/05/23 3:51 PM

## 2023-08-06 DIAGNOSIS — R339 Retention of urine, unspecified: Secondary | ICD-10-CM | POA: Diagnosis not present

## 2023-08-07 ENCOUNTER — Encounter: Payer: Self-pay | Admitting: Internal Medicine

## 2023-08-08 DIAGNOSIS — N3001 Acute cystitis with hematuria: Secondary | ICD-10-CM | POA: Diagnosis not present

## 2023-08-08 DIAGNOSIS — R300 Dysuria: Secondary | ICD-10-CM | POA: Diagnosis not present

## 2023-08-12 DIAGNOSIS — M25579 Pain in unspecified ankle and joints of unspecified foot: Secondary | ICD-10-CM | POA: Diagnosis not present

## 2023-08-19 DIAGNOSIS — M25579 Pain in unspecified ankle and joints of unspecified foot: Secondary | ICD-10-CM | POA: Diagnosis not present

## 2023-08-21 DIAGNOSIS — N3000 Acute cystitis without hematuria: Secondary | ICD-10-CM | POA: Diagnosis not present

## 2023-09-03 ENCOUNTER — Encounter: Payer: Self-pay | Admitting: Physical Therapy

## 2023-09-10 DIAGNOSIS — Z79899 Other long term (current) drug therapy: Secondary | ICD-10-CM | POA: Diagnosis not present

## 2023-09-10 DIAGNOSIS — R2689 Other abnormalities of gait and mobility: Secondary | ICD-10-CM | POA: Diagnosis not present

## 2023-09-10 DIAGNOSIS — M7918 Myalgia, other site: Secondary | ICD-10-CM | POA: Diagnosis not present

## 2023-09-10 DIAGNOSIS — G35 Multiple sclerosis: Secondary | ICD-10-CM | POA: Diagnosis not present

## 2023-09-21 DIAGNOSIS — R930 Abnormal findings on diagnostic imaging of skull and head, not elsewhere classified: Secondary | ICD-10-CM | POA: Diagnosis not present

## 2023-09-21 DIAGNOSIS — G35 Multiple sclerosis: Secondary | ICD-10-CM | POA: Diagnosis not present

## 2023-09-22 DIAGNOSIS — M25579 Pain in unspecified ankle and joints of unspecified foot: Secondary | ICD-10-CM | POA: Diagnosis not present

## 2023-09-23 DIAGNOSIS — G35 Multiple sclerosis: Secondary | ICD-10-CM | POA: Diagnosis not present

## 2023-09-23 DIAGNOSIS — M8580 Other specified disorders of bone density and structure, unspecified site: Secondary | ICD-10-CM | POA: Diagnosis not present

## 2023-09-29 DIAGNOSIS — N3020 Other chronic cystitis without hematuria: Secondary | ICD-10-CM | POA: Diagnosis not present

## 2023-09-30 DIAGNOSIS — M25579 Pain in unspecified ankle and joints of unspecified foot: Secondary | ICD-10-CM | POA: Diagnosis not present

## 2023-10-02 DIAGNOSIS — N3020 Other chronic cystitis without hematuria: Secondary | ICD-10-CM | POA: Diagnosis not present

## 2023-10-06 DIAGNOSIS — M25579 Pain in unspecified ankle and joints of unspecified foot: Secondary | ICD-10-CM | POA: Diagnosis not present

## 2023-10-08 ENCOUNTER — Other Ambulatory Visit: Payer: Self-pay | Admitting: Urology

## 2023-10-08 NOTE — Progress Notes (Signed)
 COVID Vaccine Completed:  Yes  Date of COVID positive in last 90 days:  PCP - Berniece Andreas, MD Cardiologist -  Neurologist - Curt Bears, MD  Chest x-ray -  EKG -  Day of surgery, last EKG 10-15-22 Epic Stress Test -  ECHO -  Cardiac Cath -  Pacemaker/ICD device last checked: Spinal Cord Stimulator:  Bowel Prep -   Sleep Study -  CPAP -   Fasting Blood Sugar -  Checks Blood Sugar _____ times a day  Last dose of GLP1 agonist-  N/A GLP1 instructions:  Hold 7 days before surgery    Last dose of SGLT-2 inhibitors-  N/A SGLT-2 instructions:  Hold 3 days before surgery    Blood Thinner Instructions:  Last dose:   Time: Aspirin Instructions: Last Dose:  Activity level:  Can go up a flight of stairs and perform activities of daily living without stopping and without symptoms of chest pain or shortness of breath.  Able to exercise without symptoms  Unable to go up a flight of stairs without symptoms of     Anesthesia review:   MS  Patient denies shortness of breath, fever, cough and chest pain at PAT appointment  Patient verbalized understanding of instructions that were given to them at the PAT appointment. Patient was also instructed that they will need to review over the PAT instructions again at home before surgery.

## 2023-10-09 ENCOUNTER — Encounter (HOSPITAL_COMMUNITY): Payer: Self-pay | Admitting: Urology

## 2023-10-09 ENCOUNTER — Other Ambulatory Visit: Payer: Self-pay

## 2023-10-14 ENCOUNTER — Encounter (HOSPITAL_COMMUNITY)
Admission: RE | Disposition: A | Discharge status: Home / Self Care | Payer: Self-pay | Source: Home / Self Care | Attending: Urology

## 2023-10-14 ENCOUNTER — Ambulatory Visit (HOSPITAL_COMMUNITY)
Admission: RE | Admit: 2023-10-14 | Discharge: 2023-10-14 | Disposition: A | Discharge status: Home / Self Care | Attending: Urology | Admitting: Urology

## 2023-10-14 ENCOUNTER — Ambulatory Visit (HOSPITAL_COMMUNITY): Admitting: Physician Assistant

## 2023-10-14 ENCOUNTER — Ambulatory Visit (HOSPITAL_BASED_OUTPATIENT_CLINIC_OR_DEPARTMENT_OTHER): Admitting: Physician Assistant

## 2023-10-14 ENCOUNTER — Encounter (HOSPITAL_COMMUNITY): Payer: Self-pay | Admitting: Urology

## 2023-10-14 DIAGNOSIS — Z87440 Personal history of urinary (tract) infections: Secondary | ICD-10-CM | POA: Diagnosis not present

## 2023-10-14 DIAGNOSIS — K5900 Constipation, unspecified: Secondary | ICD-10-CM | POA: Insufficient documentation

## 2023-10-14 DIAGNOSIS — F32A Depression, unspecified: Secondary | ICD-10-CM | POA: Insufficient documentation

## 2023-10-14 DIAGNOSIS — I10 Essential (primary) hypertension: Secondary | ICD-10-CM | POA: Insufficient documentation

## 2023-10-14 DIAGNOSIS — K219 Gastro-esophageal reflux disease without esophagitis: Secondary | ICD-10-CM | POA: Diagnosis not present

## 2023-10-14 DIAGNOSIS — I447 Left bundle-branch block, unspecified: Secondary | ICD-10-CM | POA: Diagnosis not present

## 2023-10-14 DIAGNOSIS — N3020 Other chronic cystitis without hematuria: Secondary | ICD-10-CM | POA: Insufficient documentation

## 2023-10-14 DIAGNOSIS — G35 Multiple sclerosis: Secondary | ICD-10-CM | POA: Diagnosis not present

## 2023-10-14 DIAGNOSIS — N3941 Urge incontinence: Secondary | ICD-10-CM | POA: Insufficient documentation

## 2023-10-14 DIAGNOSIS — Z79899 Other long term (current) drug therapy: Secondary | ICD-10-CM | POA: Diagnosis not present

## 2023-10-14 HISTORY — PX: CYSTOSCOPY WITH INJECTION: SHX1424

## 2023-10-14 LAB — BASIC METABOLIC PANEL
Anion gap: 8 (ref 5–15)
BUN: 17 mg/dL (ref 8–23)
CO2: 27 mmol/L (ref 22–32)
Calcium: 9 mg/dL (ref 8.9–10.3)
Chloride: 105 mmol/L (ref 98–111)
Creatinine, Ser: 0.56 mg/dL (ref 0.44–1.00)
GFR, Estimated: >60 mL/min (ref >60)
Glucose, Bld: 97 mg/dL (ref 70–99)
Potassium: 4.2 mmol/L (ref 3.5–5.1)
Sodium: 140 mmol/L (ref 135–145)

## 2023-10-14 SURGERY — CYSTOSCOPY, WITH INJECTION OF BLADDER NECK OR BLADDER WALL
Anesthesia: Monitor Anesthesia Care

## 2023-10-14 MED ORDER — FENTANYL CITRATE (PF) 100 MCG/2ML IJ SOLN
INTRAMUSCULAR | Status: DC | PRN
Start: 2023-10-14 — End: 2023-10-14
  Administered 2023-10-14: 50 ug via INTRAVENOUS

## 2023-10-14 MED ORDER — PROPOFOL 500 MG/50ML IV EMUL
INTRAVENOUS | Status: DC | PRN
  Administered 2023-10-14: 150 ug/kg/min via INTRAVENOUS

## 2023-10-14 MED ORDER — MIDAZOLAM HCL 2 MG/2ML IJ SOLN
INTRAMUSCULAR | Status: AC
  Filled 2023-10-14: qty 2

## 2023-10-14 MED ORDER — ONDANSETRON HCL 4 MG/2ML IJ SOLN: 4.0000 mg | Freq: Once | INTRAMUSCULAR | Status: DC | PRN

## 2023-10-14 MED ORDER — FENTANYL CITRATE (PF) 100 MCG/2ML IJ SOLN
INTRAMUSCULAR | Status: AC
  Filled 2023-10-14: qty 2

## 2023-10-14 MED ORDER — MIDAZOLAM HCL 2 MG/2ML IJ SOLN
INTRAMUSCULAR | Status: DC | PRN
  Administered 2023-10-14 (×2): 1 mg via INTRAVENOUS

## 2023-10-14 MED ORDER — LACTATED RINGERS IV SOLN: INTRAVENOUS | Status: DC | PRN

## 2023-10-14 MED ORDER — PROPOFOL 10 MG/ML IV BOLUS
INTRAVENOUS | Status: DC | PRN
  Administered 2023-10-14: 20 mg via INTRAVENOUS

## 2023-10-14 MED ORDER — ONABOTULINUMTOXINA 100 UNITS IJ SOLR
INTRAMUSCULAR | Status: DC | PRN
  Administered 2023-10-14: 300 [IU] via INTRAMUSCULAR

## 2023-10-14 MED ORDER — PROPOFOL 1000 MG/100ML IV EMUL
INTRAVENOUS | Status: AC
  Filled 2023-10-14: qty 100

## 2023-10-14 MED ORDER — ONABOTULINUMTOXINA 100 UNITS IJ SOLR
INTRAMUSCULAR | Status: AC
  Filled 2023-10-14: qty 100

## 2023-10-14 MED ORDER — CHLORHEXIDINE GLUCONATE 0.12 % MT SOLN
15.0000 mL | Freq: Once | OROMUCOSAL | Status: AC
  Administered 2023-10-14: 15 mL via OROMUCOSAL

## 2023-10-14 MED ORDER — ORAL CARE MOUTH RINSE: 15.0000 mL | Freq: Once | OROMUCOSAL | Status: AC

## 2023-10-14 MED ORDER — ONDANSETRON HCL 4 MG/2ML IJ SOLN
INTRAMUSCULAR | Status: DC | PRN
  Administered 2023-10-14: 4 mg via INTRAVENOUS

## 2023-10-14 MED ORDER — FENTANYL CITRATE PF 50 MCG/ML IJ SOSY: 25.0000 ug | PREFILLED_SYRINGE | INTRAMUSCULAR | Status: DC | PRN

## 2023-10-14 MED ORDER — PHENYLEPHRINE 80 MCG/ML (10ML) SYRINGE FOR IV PUSH (FOR BLOOD PRESSURE SUPPORT)
PREFILLED_SYRINGE | INTRAVENOUS | Status: DC | PRN
Start: 2023-10-14 — End: 2023-10-14
  Administered 2023-10-14 (×2): 40 ug via INTRAVENOUS

## 2023-10-14 MED ORDER — SODIUM CHLORIDE (PF) 0.9 % IJ SOLN
INTRAMUSCULAR | Status: DC | PRN
  Administered 2023-10-14: 30 mL

## 2023-10-14 MED ORDER — SODIUM CHLORIDE (PF) 0.9 % IJ SOLN
INTRAMUSCULAR | Status: AC
  Filled 2023-10-14: qty 10

## 2023-10-14 MED ORDER — CEFAZOLIN SODIUM-DEXTROSE 2-4 GM/100ML-% IV SOLN
2.0000 g | Freq: Once | INTRAVENOUS | Status: AC
  Administered 2023-10-14: 2 g via INTRAVENOUS
  Filled 2023-10-14: qty 100

## 2023-10-14 MED ORDER — ONABOTULINUMTOXINA 100 UNITS IJ SOLR
INTRAMUSCULAR | Status: AC
  Filled 2023-10-14: qty 200

## 2023-10-14 MED ORDER — SODIUM CHLORIDE 0.9 % IR SOLN
Status: DC | PRN
  Administered 2023-10-14: 3000 mL via INTRAVESICAL

## 2023-10-14 MED ORDER — ACETAMINOPHEN 500 MG PO TABS
1000.0000 mg | ORAL_TABLET | Freq: Once | ORAL | Status: AC
  Administered 2023-10-14: 1000 mg via ORAL
  Filled 2023-10-14: qty 2

## 2023-10-14 MED ORDER — PROPOFOL 10 MG/ML IV BOLUS
INTRAVENOUS | Status: AC
  Filled 2023-10-14: qty 20

## 2023-10-14 MED ORDER — LIDOCAINE HCL (PF) 2 % IJ SOLN
INTRAMUSCULAR | Status: AC
  Filled 2023-10-14: qty 5

## 2023-10-14 MED ORDER — DEXAMETHASONE SODIUM PHOSPHATE 10 MG/ML IJ SOLN
INTRAMUSCULAR | Status: DC | PRN
  Administered 2023-10-14: 4 mg via INTRAVENOUS

## 2023-10-14 SURGICAL SUPPLY — 14 items
BAG URO CATCHER STRL LF (MISCELLANEOUS) ×1 IMPLANT
CLOTH BEACON ORANGE TIMEOUT ST (SAFETY) ×1 IMPLANT
GLOVE SURG LX STRL 7.5 STRW (GLOVE) ×1 IMPLANT
GOWN STRL REUS W/ TWL XL LVL3 (GOWN DISPOSABLE) ×2 IMPLANT
KIT TURNOVER KIT A (KITS) IMPLANT
MANIFOLD NEPTUNE II (INSTRUMENTS) ×1 IMPLANT
NDL ASPIRATION 22 (NEEDLE) ×1 IMPLANT
NDL SAFETY ECLIPSE 18X1.5 (NEEDLE) IMPLANT
NEEDLE ASPIRATION 22 (NEEDLE) ×1 IMPLANT
PACK CYSTO (CUSTOM PROCEDURE TRAY) ×1 IMPLANT
PENCIL SMOKE EVACUATOR (MISCELLANEOUS) IMPLANT
SYR CONTROL 10ML LL (SYRINGE) IMPLANT
TUBING CONNECTING 10 (TUBING) IMPLANT
WATER STERILE IRR 3000ML UROMA (IV SOLUTION) ×1 IMPLANT

## 2023-10-14 NOTE — Op Note (Signed)
Operative Note  Preoperative diagnosis:  1.  Refractory urge incontinence 2.  History of multiple sclerosis  Postoperative diagnosis: Same  Procedure(s): 1.  Cystoscopy with intravesical Botox injections (300 units)  Surgeon: Rhoderick Moody, MD  Assistants:  None  Anesthesia:  General MAC  Complications:  None  EBL: Less than 5 mL  Specimens: 1.  None  Drains/Catheters: 1.  None  Intraoperative findings:   Moderate to severe bladder trabeculation with no other intravesical abnormalities  Indication:  [name] is a 68 y.o. female with a history of refractory urge incontinence and multiple sclerosis.  Her urge incontinence has been well-managed with intravesical Botox injections in the past and she is here today for repeat injection.  She has been consented for the above procedures, voices understanding and wishes to proceed.  Description of procedure:  After informed consent was obtained, the patient was brought to the operating room and general MAC anesthesia was administered. The patient was then placed in the dorsolithotomy position and prepped and draped in the usual sterile fashion. A timeout was performed. A 23 French rigid cystoscope was then inserted into the urethral meatus and advanced into the bladder under direct vision. A complete bladder survey revealed no intravesical pathology.  I then injected a total of 300 units of Botox in the detrusor musculature in a grid like fashion and 0.5 mL aliquots.  There was no significant bleeding following the series of injections.  Her bladder was then drained.  She tolerated the procedure well and was transferred to the postanesthesia in stable condition.  Plan: Discharge home.  Resume clean remittent catheterization

## 2023-10-14 NOTE — Anesthesia Postprocedure Evaluation (Signed)
 Anesthesia Post Note  Patient: [name]  Procedure(s) Performed: CYSTOSCOPY, WITH INJECTION OF BLADDER NECK OR BLADDER WALL     Patient location during evaluation: PACU Anesthesia Type: MAC Level of consciousness: awake and alert and oriented Pain management: pain level controlled Vital Signs Assessment: post-procedure vital signs reviewed and stable Respiratory status: spontaneous breathing, nonlabored ventilation and respiratory function stable Cardiovascular status: stable and blood pressure returned to baseline Postop Assessment: no apparent nausea or vomiting Anesthetic complications: no   No notable events documented.  Last Vitals:  Vitals:   10/14/23 1309 10/14/23 1315  BP: (!) 110/56 125/60  Pulse: 73 72  Resp: 18 15  Temp: (!) 36.2 C   SpO2: 100% 100%    Last Pain:  Vitals:   10/14/23 1315  TempSrc:   PainSc: 0-No pain                 Ketra Duchesne A.

## 2023-10-14 NOTE — Transfer of Care (Signed)
 Immediate Anesthesia Transfer of Care Note  Patient: [name]  Procedure(s) Performed: CYSTOSCOPY, WITH INJECTION OF BLADDER NECK OR BLADDER WALL  Patient Location: PACU  Anesthesia Type:MAC  Level of Consciousness: awake, alert , and patient cooperative  Airway & Oxygen Therapy: Patient Spontanous Breathing and Patient connected to face mask oxygen  Post-op Assessment: Report given to RN and Post -op Vital signs reviewed and stable  Post vital signs: Reviewed and stable  Last Vitals:  Vitals Value Taken Time  BP 110/56 10/14/23 1309  Temp    Pulse 73 10/14/23 1311  Resp 17 10/14/23 1311  SpO2 100 % 10/14/23 1311  Vitals shown include unfiled device data.  Last Pain:  Vitals:   10/14/23 1055  TempSrc:   PainSc: 0-No pain         Complications: No notable events documented.

## 2023-10-14 NOTE — H&P (Signed)
 Office Visit Report     10/02/2023   --------------------------------------------------------------------------------   [name]  MRN: [id number]  DOB: 04/16/1956, 68 year old Female  PRIMARY CARE:  Berniece Andreas, MD  PRIMARY CARE FAX:  [phone]  REFERRING:  Bartholomew Crews, NP  PROVIDER:  Rhoderick Moody, M.D.  LOCATION:  [facility] [phone]     --------------------------------------------------------------------------------   CC/HPI: CC: Bladder infections   HPI: [name] is a 68 year old female with a history multiple sclerosis incontinence w/o sensation and recurrent urinary tract infections. She performs self-catheterization BID/TID when she feels suprapubic fullness/discomfort. s/p intravesical botox injections (300 units) on 04/10/21.   04/13/19: Patient with above noted history. She presents today with concerns for UTI. She states that currently she is having increased issues with her MS and is going for further testing this week. She states that for the past 1-2 weeks she has been having increased incontinence between spontaneous voids and CIC. She continues to perform CIC x 2-3 times daily. She denies difficulties performing CIC, but did not some discomfort earlier this week. She denies gross hematuria or fevers. She has been using Uribel, which does help. She states that she was treated about 6 weeks ago for an episode of cystitis, but it has been approximately one year prior to this event. She remains on keflex, cranberry tablets, D-mannose, probiotics and Vit C for UTI prevention.   02/29/20: The patient his here today for a f/u after developing dysuria and a suspected UTI. She states that her urinary sxs cleared after a course of keflex. She had a urine culture performed on 7/28 that showed no growth. UA today is clear. She continues to perform CIC w/o difficulty. Of note, she is frustrated with the lack of communication between the triage and  nursing staff concerning her recent issues.   10/31/2020: Patient with above noted past medical history. She is currently not having any issues but is going on a cruise and wanted to ensure she was not having any infections. She also wanted something for urgency and frequency if needed. She currently denies any increased urgency, frequency, dysuria. Her urinalysis is not overly concerning for an infection today. She recently picked up her cephalexin which she takes as a suppressive antibiotic. Marland Kitchen She is going on a cruise to [place] and is very excited about this.   03/11/21: [name] for a routine follow-up. Diarrhea has worsened over the pat 6 months--currently on daily keflex. She was prescribed oxybutynin at her last visit, but admits that she has not started the medication out of concern for dry eye side effects. She reports that she is completely incontinent and wears 3-5 pads per day. She continues to perform CIC 3 times daily w/o any discomfort.   04/26/21: [name] for a routine follow-up after her recent botox injections. She reports marked improvement in her incontinence. She is not requiring protective pads (was previously wearing 3-5). No issues cathing and denies malodorous urine, hematuria or dysuria.   09/16/21: [name] for a routine follow-up. The patient states that her incontinence remains very well managed by her previous Botox injections. She reports minimal incontinence and is not requiring any protective pads. She continues to perform CIC every 4-6 hours throughout the day. She denies any interval UTIs or dysuria. She does note 1 episode of gross hematuria immediately following catheterization that quickly resolved.   03/17/22: [name]  for a routine follow-up. She is doing well and reports no new health issues. She notes that over the past 3 to 4 weeks she has seen an increase in urinary urgency and urge incontinence. She  continues to catheterize every 4-6 hours without any pain. She denies interval UTIs, dysuria or gross hematuria. She continues to take Keflex 250 mg daily for UTI prophylaxis.   03/27/22: The patient presents back today with crampy lower abdominal pain and increased urinary urgency. Her urine culture from May 2023 showed no growth and her UA today is unremarkable. She denies any significant dysuria, flank pain or hematuria. She does have intermittent issues with constipation, but states that her bowel movements have been regular for her. She denies fevers or chills. No issues cathing.   09/15/22: [name] for a routine follow-up. She notes increased urgency and urge incontinence over the past 3-4 weeks. No interval UTIs since her last visit. No issues cathing. Her daughter was recently diagnosed with MS as well.   04/03/23: [name] for a routine follow-up. Her only complaint today is LLQ pain that has gradually worsened over the past 2-3 days. Hx of diverticulosis/-itis. No hx of stones. Denies fever/chills, nausea/vomiting. From a urinary standpoint, she notes a slight increase in urinary urgency/incontinence. She also notes a small amount of blood per urethra shortly after cathing.   08/21/23: [name] presents today for concerns of cystits. over the past few days she is having pain with performing CIC, increased sensation of urgency and frequency. She denies fevers.   09/29/2023: 68 year old female who presents today for follow-up. At last office visit she was treated for UTI and started on methenamine. She reports she is doing very well on this and is very pleased thus far. She is seeing Dr. Liliane Shi in the next few days for discussion of Botox. She is tolerating the methenamine very well.   10/02/2023: Above history noted. [name] for routine follow-up. UTI sxs have resolved--still taking methenamine. She notes more urge incontinence over the past 2-3 weeks  and is ready for a repeat botox injection. No new health issues.     ALLERGIES: Meclizine HCl TABS - Other Reaction, hallucination Omeprazole TBEC - Hives Tramadol - Itching, Hives    MEDICATIONS: Oxybutynin Chloride 5 mg tablet 1 tablet PO BID PRN  Amlodipine Besylate 2.5 mg tablet  Aubagio  Biotin  Cranberry  Cymbalta 30 mg capsule,delayed release  Florajen Acidophilus 20 billion cell capsule 1 capsule PO Daily  Gabapentin 600 mg tablet  Losartan-Hydrochlorothiazide  Methenamine Hippurate 1 gram tablet 1 tablet PO BID Start 24 hours after last antibiotic  NexIUM PACK Oral  Tysabri 300 MG/15ML Intravenous Concentrate Intravenous  Uribel 118 mg-10 mg-40.8 mg-36 mg-0.12 mg capsule 1 capsule PO Q 8 H PRN Take as needed for painful urination  Vitamin B12  Vitamin C  Vitamin D3 5000 UNIT Oral Tablet Oral     GU PSH: Cystourethroscopy, W/Injection For Chemodenervation Of Bladder - 10/15/2022, 04/09/2022, 10/25/2021, 04/10/2021 Hysterectomy Unilat SO - 2013       PSH Notes: Bladder Surgery   NON-GU PSH: Rotator Cuff Surgery.. Shoulder Surgery (Unspecified) Visit Complexity (formerly GPC1X) - 09/29/2023, 08/21/2023, 04/03/2023, 09/15/2022     GU PMH: Areflexic bladder - 09/29/2023, - 04/03/2023, - 09/15/2022, - 03/27/2022, - 03/17/2022, - 2023, - 04/26/2021, - 2022, - 2022, - 2019, - 2017, Hypotonic bladder, - 2016 Chronic cystitis (w/o hematuria) - 09/29/2023, - 03/17/2022, - 2023, -  2022, - 2019, - 2017, Chronic cystitis, - 2015 Acute Cystitis/UTI - 08/21/2023, - 2019 Incontinence w/o Sensation - 04/03/2023, - 09/15/2022, - 03/27/2022, - 2023, - 04/26/2021, - 2022, Urinary incontinence without sensory awareness, - 2016 LLQ pain - 04/03/2023 Pelvic/perineal pain (Stable) - 03/27/2022, Abdominal pain, suprapubic, - 2015 Dysuria, Dysuria - 2016 Urinary Tract Inf, Unspec site, Pyuria - 2015, Urinary tract infection, - 2014    NON-GU PMH: Multiple sclerosis - 09/15/2022, - 03/17/2022, - 2022, Multiple  sclerosis, - 2016 Encounter for general adult medical examination without abnormal findings, Encounter for preventive health examination - 2015 Anxiety, Anxiety (Symptom) - 2014 Personal history of other mental and behavioral disorders, History of depression - 2014 Personal history of other specified conditions, History of heartburn - 2014 Migraine, unspecified, not intractable, without status migrainosus    FAMILY HISTORY: Death In The Family Father - Father Family Health Status - Mother's Age - Mother Family Health Status Number - Runs In Family Lung Cancer - Father   SOCIAL HISTORY: Marital Status: Married Preferred Language: English; Ethnicity: Not Hispanic Or Latino; Race: White Current Smoking Status: Patient has never smoked.  Does not use smokeless tobacco. Drinks 2 drinks per day.  Does not use drugs. Drinks 2 caffeinated drinks per day. Patient's occupation is/was Retired.    REVIEW OF SYSTEMS:    GU Review Female:   Patient denies frequent urination, hard to postpone urination, burning /pain with urination, get up at night to urinate, leakage of urine, stream starts and stops, trouble starting your stream, have to strain to urinate, and being pregnant.  Gastrointestinal (Upper):   Patient denies nausea, vomiting, and indigestion/ heartburn.  Gastrointestinal (Lower):   Patient denies constipation and diarrhea.  Constitutional:   Patient denies fever, night sweats, weight loss, and fatigue.  Skin:   Patient denies skin rash/ lesion and itching.  Eyes:   Patient denies blurred vision and double vision.  Ears/ Nose/ Throat:   Patient denies sore throat and sinus problems.  Hematologic/Lymphatic:   Patient denies swollen glands and easy bruising.  Cardiovascular:   Patient denies leg swelling and chest pains.  Respiratory:   Patient denies cough and shortness of breath.  Endocrine:   Patient denies excessive thirst.  Musculoskeletal:   Patient denies back pain and joint  pain.  Neurological:   Patient denies headaches and dizziness.  Psychologic:   Patient denies depression and anxiety.   VITAL SIGNS: None   Complexity of Data:  Records Review:   Previous Patient Records   PROCEDURES:          Visit Complexity - G2211    ASSESSMENT:      ICD-10 Details  1 GU:   Chronic cystitis (w/o hematuria) - N30.20 Chronic, Stable  2   Incontinence w/o Sensation - N39.42 Chronic, Stable  3   Areflexic bladder - N31.2 Chronic, Stable     PLAN:           Schedule Return Visit/Planned Activity: Next Available Appointment - Schedule Surgery          Document Letter(s):  Created for Patient: Clinical Summary         Notes:   -Continue to perform CIC 5 times daily  -Continue methenamine BID  -Continue UTI prevention supplements  -Repeat botox (300 units) in the coming weeks

## 2023-10-14 NOTE — Anesthesia Procedure Notes (Signed)
 Procedure Name: MAC Date/Time: 10/14/2023 12:40 PM  Performed by: Sindy Guadeloupe, CRNAPre-anesthesia Checklist: Patient identified, Emergency Drugs available, Suction available, Patient being monitored and Timeout performed Oxygen Delivery Method: Simple face mask Placement Confirmation: positive ETCO2

## 2023-10-14 NOTE — Anesthesia Preprocedure Evaluation (Signed)
 Anesthesia Evaluation  Patient identified by MRN, date of birth, ID band Patient awake    Reviewed: Allergy & Precautions, NPO status , Patient's Chart, lab work & pertinent test results  Airway Mallampati: II  TM Distance: >3 FB Neck ROM: Full    Dental  (+) Teeth Intact, Dental Advisory Given   Pulmonary neg pulmonary ROS   Pulmonary exam normal breath sounds clear to auscultation       Cardiovascular hypertension, Pt. on medications Normal cardiovascular exam Rhythm:Regular Rate:Normal     Neuro/Psych  PSYCHIATRIC DISORDERS  Depression    MS    GI/Hepatic Neg liver ROS,GERD  Medicated,,  Endo/Other  negative endocrine ROS    Renal/GU negative Renal ROS Bladder dysfunction (Incontinence without sensory awareness)      Musculoskeletal  (+) Arthritis ,    Abdominal   Peds  Hematology negative hematology ROS (+)   Anesthesia Other Findings Day of surgery medications reviewed with the patient.  Reproductive/Obstetrics                              Anesthesia Physical Anesthesia Plan  ASA: 3  Anesthesia Plan: MAC   Post-op Pain Management: Tylenol PO (pre-op)*   Induction: Intravenous  PONV Risk Score and Plan: 4 or greater and Dexamethasone, Ondansetron, Treatment may vary due to age or medical condition and TIVA  Airway Management Planned: Natural Airway and Simple Face Mask  Additional Equipment:   Intra-op Plan:   Post-operative Plan:   Informed Consent: I have reviewed the patients History and Physical, chart, labs and discussed the procedure including the risks, benefits and alternatives for the proposed anesthesia with the patient or authorized representative who has indicated his/her understanding and acceptance.     Dental advisory given  Plan Discussed with: CRNA  Anesthesia Plan Comments:          Anesthesia Quick Evaluation

## 2023-10-15 ENCOUNTER — Encounter (HOSPITAL_COMMUNITY): Payer: Self-pay | Admitting: Urology

## 2023-10-15 ENCOUNTER — Ambulatory Visit: Admitting: Adult Health

## 2023-10-19 DIAGNOSIS — M25579 Pain in unspecified ankle and joints of unspecified foot: Secondary | ICD-10-CM | POA: Diagnosis not present

## 2023-10-20 DIAGNOSIS — N3000 Acute cystitis without hematuria: Secondary | ICD-10-CM | POA: Diagnosis not present

## 2023-10-20 DIAGNOSIS — N390 Urinary tract infection, site not specified: Secondary | ICD-10-CM | POA: Diagnosis not present

## 2023-10-27 DIAGNOSIS — M25579 Pain in unspecified ankle and joints of unspecified foot: Secondary | ICD-10-CM | POA: Diagnosis not present

## 2023-10-28 DIAGNOSIS — K649 Unspecified hemorrhoids: Secondary | ICD-10-CM | POA: Diagnosis not present

## 2023-10-28 DIAGNOSIS — M6289 Other specified disorders of muscle: Secondary | ICD-10-CM | POA: Diagnosis not present

## 2023-10-28 DIAGNOSIS — K5900 Constipation, unspecified: Secondary | ICD-10-CM | POA: Diagnosis not present

## 2023-10-30 DIAGNOSIS — Z79899 Other long term (current) drug therapy: Secondary | ICD-10-CM | POA: Diagnosis not present

## 2023-10-30 DIAGNOSIS — Z780 Asymptomatic menopausal state: Secondary | ICD-10-CM | POA: Diagnosis not present

## 2023-10-30 DIAGNOSIS — E559 Vitamin D deficiency, unspecified: Secondary | ICD-10-CM | POA: Diagnosis not present

## 2023-10-30 DIAGNOSIS — Z8781 Personal history of (healed) traumatic fracture: Secondary | ICD-10-CM | POA: Diagnosis not present

## 2023-10-30 DIAGNOSIS — Z8262 Family history of osteoporosis: Secondary | ICD-10-CM | POA: Diagnosis not present

## 2023-10-30 DIAGNOSIS — G35 Multiple sclerosis: Secondary | ICD-10-CM | POA: Diagnosis not present

## 2023-10-30 DIAGNOSIS — Z8489 Family history of other specified conditions: Secondary | ICD-10-CM | POA: Diagnosis not present

## 2023-10-30 DIAGNOSIS — M810 Age-related osteoporosis without current pathological fracture: Secondary | ICD-10-CM | POA: Diagnosis not present

## 2023-11-03 DIAGNOSIS — M25579 Pain in unspecified ankle and joints of unspecified foot: Secondary | ICD-10-CM | POA: Diagnosis not present

## 2023-11-04 ENCOUNTER — Encounter: Payer: Self-pay | Admitting: Internal Medicine

## 2023-11-04 ENCOUNTER — Ambulatory Visit: Admitting: Internal Medicine

## 2023-11-04 ENCOUNTER — Ambulatory Visit

## 2023-11-04 VITALS — BP 136/78 | HR 77 | Temp 97.9°F | Ht 65.0 in | Wt 153.0 lb

## 2023-11-04 DIAGNOSIS — G8929 Other chronic pain: Secondary | ICD-10-CM

## 2023-11-04 DIAGNOSIS — M51379 Other intervertebral disc degeneration, lumbosacral region without mention of lumbar back pain or lower extremity pain: Secondary | ICD-10-CM | POA: Diagnosis not present

## 2023-11-04 DIAGNOSIS — Z79899 Other long term (current) drug therapy: Secondary | ICD-10-CM

## 2023-11-04 DIAGNOSIS — M545 Low back pain, unspecified: Secondary | ICD-10-CM | POA: Diagnosis not present

## 2023-11-04 DIAGNOSIS — I10 Essential (primary) hypertension: Secondary | ICD-10-CM | POA: Diagnosis not present

## 2023-11-04 DIAGNOSIS — G35 Multiple sclerosis: Secondary | ICD-10-CM

## 2023-11-04 DIAGNOSIS — M79672 Pain in left foot: Secondary | ICD-10-CM

## 2023-11-04 DIAGNOSIS — M4856XA Collapsed vertebra, not elsewhere classified, lumbar region, initial encounter for fracture: Secondary | ICD-10-CM | POA: Diagnosis not present

## 2023-11-04 DIAGNOSIS — M549 Dorsalgia, unspecified: Secondary | ICD-10-CM | POA: Diagnosis not present

## 2023-11-04 NOTE — Progress Notes (Signed)
 Chief Complaint  Patient presents with   Medical Management of Chronic Issues   Back Pain    Pt c/o ongoing back pain. Pt would like to discuss on muscle relazer   Foot Pain    Pt reports ongoing foot pain on L side. Would like a referral to foot doctor.     HPI: [name] 68 y.o. come in for   help with  left foot pain and aggravated mid back pain  without fall .  Has been under PT    Off  tysabri and now on augrago.  And thus less check with neuro.  Muscle relaxers tried  ? If one helped or not  Mobility dec from last year ; uses   walker now except at home.  Left knee .  And had  had lateral knee  chip .  Left foot .  Top of foot   and contracture toes  burning pain and something snaps at times   Due for reclast needs bmp level  before infusion  ROS: See pertinent positives and negatives per HPI.  Past Medical History:  Diagnosis Date   Ambulates with cane 04/08/2021   prn occ walker prn   Arthritis    left knee   Diverticulosis of colon    GERD (gastroesophageal reflux disease)    History of concussion 2019   no residual   Hypertension    Multiple sclerosis, relapsing-remitting (HCC) 2007   neurologist--- dr Theodosia Paling ([facility] [place] [address]   Neurogenic bladder    Self-catheterizes urinary bladder 04/08/2021   self caths 3 to 4 x daily   Sjogren's syndrome (HCC) 10/23/2021   runs vaporizer in room @ night to help with   Torn ligament 04/01/2022   left knee   Urge incontinence    Uses walker 04/08/2021   prn   Varicose veins of both lower extremities     Family History  Problem Relation Age of Onset   Lung cancer Father    Colon cancer Neg Hx     Social History   Socioeconomic History   Marital status: Married    Spouse name: Not on file   Number of children: 4   Years of education: Not on file   Highest education level: Bachelor's degree (e.g., BA, AB, BS)  Occupational History   Occupation: Disability  Tobacco Use    Smoking status: Never    Passive exposure: Never   Smokeless tobacco: Never  Vaping Use   Vaping status: Never Used  Substance and Sexual Activity   Alcohol use: Yes    Comment: 2 glasses wine per day   Drug use: No   Sexual activity: Not on file  Other Topics Concern   Not on file  Social History Narrative   Unable to volunteer    Now on disability   Children now out of home   Daily caffeine    Social Drivers of Health   Financial Resource Strain: Low Risk  (09/22/2023)   Received from [facility]   Overall Financial Resource Strain (CARDIA)    Difficulty of Paying Living Expenses: Not hard at all  Food Insecurity: No Food Insecurity (09/22/2023)   [facility] from [facility]   Hunger Vital Sign    Worried About Running Out of Food in the Last Year: Never true    Ran Out of Food in the Last Year: Never true  Transportation Needs: No Transportation Needs (09/22/2023)   Received from  [facility]   PRAPARE - Administrator, Civil Service (Medical): No    Lack of Transportation (Non-Medical): No  Physical Activity: Insufficiently Active (09/22/2023)   Received from [facility]   Exercise Vital Sign    Days of Exercise per Week: 3 days    Minutes of Exercise per Session: 40 min  Stress: No Stress Concern Present (09/22/2023)   Received from [facility] Occupational Health - Occupational Stress Questionnaire    Feeling of Stress : Not at all  Social Connections: Socially Integrated (09/22/2023)   Received from [facility]   Social Network    How would you rate your social network (family, work, friends)?: Good participation with social networks    Outpatient Medications Prior to Visit  Medication Sig Dispense Refill   amLODipine (NORVASC) 2.5 MG tablet TAKE 1 TABLET BY MOUTH DAILY 90 tablet 3   ascorbic acid (VITAMIN C) 500 MG tablet Take 500 mg by mouth daily.     Biotin 72536 MCG TABS Take 1 tablet by mouth daily at 6 (six) AM.  1000 mg daily     Calcium Citrate-Vitamin D (CALCIUM CITRATE+D3 PO) Take 600 mg by mouth daily.     Cholecalciferol (VITAMIN D3) 50 MCG (2000 UT) capsule Take 2,000 Units by mouth daily.     CRANBERRY PO Take 500 mg by mouth daily. 500 mg     D-MANNOSE PO Take 1 capsule by mouth daily.     DULoxetine (CYMBALTA) 60 MG capsule Take 60 mg by mouth 2 (two) times daily.     esomeprazole (NEXIUM) 20 MG capsule Take 20 mg by mouth daily.     fluticasone (FLONASE) 50 MCG/ACT nasal spray Place 1 spray into both nostrils daily.     gabapentin (NEURONTIN) 300 MG capsule Take 600 mg by mouth at bedtime.     ibuprofen (ADVIL) 200 MG tablet Take 600 mg by mouth every 8 (eight) hours as needed for moderate pain (pain score 4-6).     losartan (COZAAR) 25 MG tablet TAKE 1 TABLET BY MOUTH DAILY FOR HIGH BLOOD PRESSURE INSTEAD OF VALSARTAN 90 tablet 1   Meth-Hyo-M Bl-Na Phos-Ph Sal (URIBEL) 118 MG CAPS Take 1 capsule by mouth daily as needed (bladder spasms). Using PRN     Probiotic Product (PROBIOTIC PEARLS) CAPS Take 1 capsule by mouth in the morning and at bedtime.     Teriflunomide (AUBAGIO) 7 MG TABS Take 7 mg by mouth every morning.     White Petrolatum-Mineral Oil (SYSTANE NIGHTTIME) OINT Place 1 Application into both eyes at bedtime.     zoledronic acid (RECLAST) 5 MG/100ML SOLN injection Inject 5 mg into the vein See admin instructions. 1 time a year     natalizumab (TYSABRI) 300 MG/15ML injection Inject 300 mg into the vein See admin instructions. Every 5 weeks     No facility-administered medications prior to visit.     EXAM:  BP 136/78 (BP Location: Right Arm, Patient Position: Sitting, Cuff Size: Normal)   Pulse 77   Temp 97.9 F (36.6 C) (Oral)   Ht 5\' 5"  (1.651 m)   Wt 153 lb (69.4 kg)   SpO2 98%   BMI 25.46 kg/m   Body mass index is 25.46 kg/m.  GENERAL: vitals reviewed and listed above, alert, oriented, appears well hydrated and in no acute distress roller walker independent    HEENT: atraumatic, conjunctiva  clear, no obvious abnormalities on inspection of external nose and  ears  NECK: no obvious masses on inspection palpation  Chest  some kyphoscoliosis    area of pain is mid line upper lumbar  but also infrascapular on left  CV: HRRR, no clubbing cyanosis or  peripheral edema nl cap refill  MS: left foot some toe contracture left little toe with corm ? An redness pressure point  PSYCH: pleasant and cooperative, no obvious depression or anxiety Lab Results  Component Value Date   WBC 8.9 07/22/2022   HGB 11.9 (L) 04/24/2023   HCT 35.0 (L) 04/24/2023   PLT 208 07/22/2022   GLUCOSE 97 10/14/2023   CHOL 172 04/24/2021   TRIG 112.0 04/24/2021   HDL 55.80 04/24/2021   LDLDIRECT 110.0 04/23/2015   LDLCALC 94 04/24/2021   ALT 20 09/24/2021   AST 17 09/24/2021   NA 140 10/14/2023   K 4.2 10/14/2023   CL 105 10/14/2023   CREATININE 0.56 10/14/2023   BUN 17 10/14/2023   CO2 27 10/14/2023   TSH 1.12 04/24/2021   HGBA1C 5.2 02/13/2014   BP Readings from Last 3 Encounters:  11/04/23 136/78  10/14/23 130/77  08/03/23 (!) 142/83    ASSESSMENT AND PLAN:  Discussed the following assessment and plan:  Left foot pain - Plan: Ambulatory referral to Podiatry  Chronic midline low back pain without sciatica - Plan: DG Lumbar Spine Complete, Basic Metabolic Panel  Essential hypertension  Medication management - Plan: Basic Metabolic Panel  Multiple sclerosis (HCC) - Plan: Basic Metabolic Panel, Ambulatory referral to Podiatry Foot back and periscapular area [place]  with underlyin gms .   Work with PT who knows her gut knee foot and back can all work to cause   some pain  I think mobic helped her in past for joint pain shoulder but not a muscle relaxant  Get dg x ray lumbar spine to r/o surprises  Due for reclast and order bmp.  Podiatry referral planned   -Patient advised to return or notify health care team  if  new concerns arise.  Patient Instructions   Will  do podiatry  about left foot pain and mechanics  X ray to be sure . No  surprises .  on spine Involve your PT.  Neta Mends. Jiovanny Burdell M.D.

## 2023-11-04 NOTE — Patient Instructions (Addendum)
 Will  do podiatry  about left foot pain and mechanics  X ray to be sure . No  surprises .  on spine Involve your PT.

## 2023-11-05 ENCOUNTER — Encounter: Payer: Self-pay | Admitting: Internal Medicine

## 2023-11-05 LAB — BASIC METABOLIC PANEL WITH GFR
BUN: 10 mg/dL (ref 6–23)
CO2: 29 meq/L (ref 19–32)
Calcium: 9.1 mg/dL (ref 8.4–10.5)
Chloride: 101 meq/L (ref 96–112)
Creatinine, Ser: 0.58 mg/dL (ref 0.40–1.20)
GFR: 92.26 mL/min (ref >60.00)
Glucose, Bld: 84 mg/dL (ref 70–99)
Potassium: 3.8 meq/L (ref 3.5–5.1)
Sodium: 139 meq/L (ref 135–145)

## 2023-11-05 NOTE — Progress Notes (Signed)
 Chemistry normal   done  pre reclast  infusion

## 2023-11-13 DIAGNOSIS — G35 Multiple sclerosis: Secondary | ICD-10-CM | POA: Diagnosis not present

## 2023-11-13 DIAGNOSIS — R413 Other amnesia: Secondary | ICD-10-CM | POA: Diagnosis not present

## 2023-11-13 DIAGNOSIS — M546 Pain in thoracic spine: Secondary | ICD-10-CM | POA: Diagnosis not present

## 2023-11-16 ENCOUNTER — Encounter: Payer: Self-pay | Admitting: Internal Medicine

## 2023-11-16 NOTE — Progress Notes (Signed)
 So x ray  reports some ?progression of the compression fracture that you have at l1  Please share this with your osteoporosis team    No new findings

## 2023-11-18 DIAGNOSIS — M810 Age-related osteoporosis without current pathological fracture: Secondary | ICD-10-CM | POA: Diagnosis not present

## 2023-11-25 ENCOUNTER — Ambulatory Visit: Admitting: Podiatrist

## 2023-11-25 ENCOUNTER — Encounter: Payer: Self-pay | Admitting: Podiatrist

## 2023-11-25 DIAGNOSIS — M2042 Other hammer toe(s) (acquired), left foot: Secondary | ICD-10-CM

## 2023-11-25 NOTE — Progress Notes (Signed)
 Chief Complaint  Patient presents with   Toe Pain    L4 toe tendon bothersome. Wears a toe separator. 2 pain.      HPI: [name] who presents today for pain left fourth toe.  She states the tendon is bothersome and sometimes it tends to get stuck.  She does have multiple sclerosis and uses a walker for ambulation assistance.  Relates in the past she has had an injection into the toe which helped with the same symptoms.  Patient Active Problem List   Diagnosis Date Noted   Positive ANA (antinuclear antibody) 11/28/2021   Discoloration of skin of toe 11/28/2021   Rectal bleeding 12/05/2016   Internal hemorrhoids 12/05/2016   Hx of falling 03/17/2016   Nausea alone 02/13/2014   Other malaise and fatigue 02/13/2014   Postmenopausal estrogen deficiency 02/24/2013   Visit for preventive health examination 02/24/2013   Family hx osteoporosis 02/21/2013   Recurrent UTI 01/09/2012   Suprapubic tenderness 12/01/2011   GERD (gastroesophageal reflux disease) 10/15/2011   Dysphagia, pharyngoesophageal 10/15/2011   Voiding dysfunction 09/24/2011   Hypertriglyceridemia 02/09/2011   Vitamin D  deficiency 12/13/2008   Constipation 12/13/2008   MUSCLE SPASM 12/13/2008   DIZZINESS 12/13/2008   HYPOKALEMIA, HX OF 12/13/2008   DISORDER, ADJUSTMENT W/DEPRESSED MOOD 04/30/2007   SCLEROSIS, MULTIPLE 04/30/2007   Essential hypertension 04/30/2007   VARICOSE VEIN, LWR EXTREMITIES W/INFLAMMATION 04/30/2007   Allergic rhinitis 04/30/2007    Current Outpatient Medications on File Prior to Visit  Medication Sig Dispense Refill   amLODipine  (NORVASC ) 2.5 MG tablet TAKE 1 TABLET BY MOUTH DAILY 90 tablet 3   ascorbic acid (VITAMIN C) 500 MG tablet Take 500 mg by mouth daily.     Biotin 87564 MCG TABS Take 1 tablet by mouth daily at 6 (six) AM. 1000 mg daily     Calcium Citrate-Vitamin D  (CALCIUM CITRATE+D3 PO) Take 600 mg by mouth daily.     Cholecalciferol (VITAMIN D3) 50 MCG (2000 UT)  capsule Take 2,000 Units by mouth daily.     CRANBERRY PO Take 500 mg by mouth daily. 500 mg     D-MANNOSE PO Take 1 capsule by mouth daily.     DULoxetine (CYMBALTA) 60 MG capsule Take 60 mg by mouth 2 (two) times daily.     esomeprazole  (NEXIUM ) 20 MG capsule Take 20 mg by mouth daily.     fluticasone (FLONASE) 50 MCG/ACT nasal spray Place 1 spray into both nostrils daily.     gabapentin (NEURONTIN) 300 MG capsule Take 600 mg by mouth at bedtime.     ibuprofen  (ADVIL ) 200 MG tablet Take 600 mg by mouth every 8 (eight) hours as needed for moderate pain (pain score 4-6).     losartan  (COZAAR ) 25 MG tablet TAKE 1 TABLET BY MOUTH DAILY FOR HIGH BLOOD PRESSURE INSTEAD OF VALSARTAN  90 tablet 1   Meth-Hyo-M Bl-Na Phos-Ph Sal (URIBEL) 118 MG CAPS Take 1 capsule by mouth daily as needed (bladder spasms). Using PRN     Probiotic Product (PROBIOTIC PEARLS) CAPS Take 1 capsule by mouth in the morning and at bedtime.     Teriflunomide (AUBAGIO) 7 MG TABS Take 7 mg by mouth every morning.     White Petrolatum -Mineral Oil (SYSTANE NIGHTTIME) OINT Place 1 Application into both eyes at bedtime.     zoledronic  acid (RECLAST ) 5 MG/100ML SOLN injection Inject 5 mg into the vein See admin instructions. 1 time a year     No current facility-administered  medications on file prior to visit.    Allergies  Allergen Reactions   Meclizine Hcl     REACTION: Psychotic reaction   Tramadol Itching   Prilosec [Omeprazole  Magnesium ] Hives    Review of Systems No fevers, chills, nausea, muscle aches, no difficulty breathing, no calf pain, no chest pain or shortness of breath.   Physical Exam  GENERAL APPEARANCE: Alert, conversant. Appropriately groomed. No acute distress.   VASCULAR: Pedal pulses palpable 2/4 DP and 1/4 PT bilateral.  Capillary refill time is immediate to all digits,  Proximal to distal cooling is warm to warm.  Digital perfusion adequate.   NEUROLOGIC: sensation is intact to 5.07 monofilament at  5/5 sites bilateral.  Light touch is intact bilateral, vibratory sensation intact bilateral  MUSCULOSKELETAL: Decreased muscle strength tone instability bilateral.  Contracture of lesser digits is noted.  Flexible hammertoe contracture of the left fourth digit is noted.  DERMATOLOGIC: skin is warm, supple, and dry.  Color, texture, and turgor of skin within normal limits.  No open wounds are noted.  No preulcerative lesions are seen.  Digital nails are asymptomatic.      Assessment   Hammertoe left fourth digit  Plan  Discussed treatment options and alternatives.  She is already tried spacers and shoe gear changes.  Discussed we could try an injection to see if this would help.  She agreed to prepped the skin with alcohol infiltrated 3 mg of dexamethasone  phosphate with 0.5% Marcaine  plain along the dorsal tendon of the left fourth toe.  She tolerated this well.  She will see how this does feels relief her pain she will call otherwise to be seen back as needed for follow-up.

## 2023-12-02 DIAGNOSIS — Z780 Asymptomatic menopausal state: Secondary | ICD-10-CM | POA: Diagnosis not present

## 2023-12-02 DIAGNOSIS — G35 Multiple sclerosis: Secondary | ICD-10-CM | POA: Diagnosis not present

## 2023-12-02 DIAGNOSIS — Z1231 Encounter for screening mammogram for malignant neoplasm of breast: Secondary | ICD-10-CM | POA: Diagnosis not present

## 2023-12-02 DIAGNOSIS — M8588 Other specified disorders of bone density and structure, other site: Secondary | ICD-10-CM | POA: Diagnosis not present

## 2023-12-02 DIAGNOSIS — Z8262 Family history of osteoporosis: Secondary | ICD-10-CM | POA: Diagnosis not present

## 2023-12-03 ENCOUNTER — Encounter (HOSPITAL_COMMUNITY): Payer: Self-pay

## 2023-12-08 DIAGNOSIS — N6489 Other specified disorders of breast: Secondary | ICD-10-CM | POA: Diagnosis not present

## 2023-12-09 ENCOUNTER — Other Ambulatory Visit: Payer: Self-pay | Admitting: Internal Medicine

## 2023-12-09 ENCOUNTER — Other Ambulatory Visit: Payer: Self-pay | Admitting: Medical Genetics

## 2023-12-10 ENCOUNTER — Encounter: Payer: Self-pay | Admitting: Family Medicine

## 2023-12-10 ENCOUNTER — Ambulatory Visit: Admitting: Family Medicine

## 2023-12-10 DIAGNOSIS — Z0000 Encounter for general adult medical examination without abnormal findings: Secondary | ICD-10-CM | POA: Diagnosis not present

## 2023-12-10 NOTE — Progress Notes (Signed)
 PATIENT CHECK-IN and HEALTH RISK ASSESSMENT QUESTIONNAIRE:  -completed by phone/video for upcoming Medicare Preventive Visit   Pre-Visit Check-in: 1)Vitals (height, wt, BP, etc) - record in vitals section for visit on day of visit Request home vitals (wt, BP, etc.) and enter into vitals, THEN update Vital Signs SmartPhrase below at the top of the HPI. See below.  2)Review and Update Medications, Allergies PMH, Surgeries, Social history in Epic 3)Hospitalizations in the last year with date/reason? no  4)Review and Update Care Team (patient's specialists) in Epic 5) Complete PHQ9 in Epic  6) Complete Fall Screening in Epic 7)Review all Health Maintenance Due and order under PCP if not done.  Medicare Wellness Patient Questionnaire:  Answer theses question about your habits: How often do you have a drink containing alcohol? 2 glasses a night How often do you have six or more drinks on one occasion? never Have you ever smoked? Yes  Quit date if applicable? 50 years  How many packs a day do/did you smoke? Less than 1  Do you use smokeless tobacco?no Do you use an illicit drugs?no On average, how many days per week do you engage in moderate to strenuous exercise (like a brisk walk)? Walks 3 days a week, 30 minutes, stretching Pretty healthy diet, eggs, oatmeal veggies and proteins Typical breakfast: Varies  Typical lunch: Varies  Typical dinner: Varies  Typical snacks: N/A   Beverages:  Water  and tea   Answer theses question about your everyday activities: Can you perform most household chores? no Are you deaf or have significant trouble hearing? no Do you feel that you have a problem with memory? Yes  Do you feel safe at home? Yes  Last dentist visit? 4 months ago 8. Do you have any difficulty performing your everyday activities? no Are you having any difficulty walking, taking medications on your own, and or difficulty managing daily home needs?Yes  Do you have difficulty walking  or climbing stairs?Yes  Do you have difficulty dressing or bathing? No Do you have difficulty doing errands alone such as visiting a doctor's office or shopping? Yes  Do you currently have any difficulty preparing food and eating? no Do you currently have any difficulty using the toilet? no Do you have any difficulty managing your finances?no Do you have any difficulties with housekeeping of managing your housekeeping? no   Do you have Advanced Directives in place (Living Will, Healthcare Power or Attorney)?  Yes    Last eye Exam and location? 6 months ago, [facility]    Do you currently use prescribed or non-prescribed narcotic or opioid pain medications? no  Do you have a history or close family history of breast, ovarian, tubal or peritoneal cancer or a family member with BRCA (breast cancer susceptibility 1 and 2) gene mutations? no   Nurse/Assistant Credentials/time stamp: MG 4:18 pm    ----------------------------------------------------------------------------------------------------------------------------------------------------------------------------------------------------------------------  Because this visit was a virtual/telehealth visit, some criteria may be missing or patient reported. Any vitals not documented were not able to be obtained and vitals that have been documented are patient reported.    MEDICARE ANNUAL PREVENTIVE VISIT WITH PROVIDER: (Welcome to Medicare, initial annual wellness or annual wellness exam)  Virtual Visit via Phone Note  I connected with [name] on 12/10/23 by phone and verified that I am speaking with the correct person using two identifiers.pt prefers phone.   Location patient: home Location provider:work or home office Persons participating in the virtual visit: patient, provider  Concerns and/or follow  up today: stable   See HM section in Epic for other details of completed HM.    ROS: negative for  report of fevers, unintentional weight loss, vision changes, vision loss, hearing loss or change, chest pain, sob, hemoptysis, melena, hematochezia, hematuria, falls, bleeding or bruising, thoughts of suicide or self harm, memory loss  Patient-completed extensive health risk assessment - reviewed and discussed with the patient: See Health Risk Assessment completed with patient prior to the visit either above or in recent phone note. This was reviewed in detailed with the patient today and appropriate recommendations, orders and referrals were placed as needed per Summary below and patient instructions.   Review of Medical History: -PMH, PSH, Family History and current specialty and care providers reviewed and updated and listed below   Patient Care Team: [name], Joaquim Muir, MD as PCP - General Davey Erp, MD as Referring Physician (Obstetrics and Gynecology) Adelbert Homans, MD as Consulting Physician (Urology) Glory Larsen, MD as Consulting Physician (Dermatology) Elisa Guest, MD as Referring Physician (Neurology) Carnell Christian, [facility] (Pharmacist)   Past Medical History:  Diagnosis Date   Ambulates with cane 04/08/2021   prn occ walker prn   Arthritis    left knee   Diverticulosis of colon    GERD (gastroesophageal reflux disease)    History of concussion 2019   no residual   Hypertension    Multiple sclerosis, relapsing-remitting (HCC) 2007   neurologist--- dr Leroy Ranks ([facility]-- [place]   Neurogenic bladder    Self-catheterizes urinary bladder 04/08/2021   self caths 3 to 4 x daily   Sjogren's syndrome (HCC) 10/23/2021   runs vaporizer in room @ night to help with   Torn ligament 04/01/2022   left knee   Urge incontinence    Uses walker 04/08/2021   prn   Varicose veins of both lower extremities     Past Surgical History:  Procedure Laterality Date   BOTOX  INJECTION N/A 04/10/2021   Procedure: BOTOX  INJECTION WITH CYSTOSCOPY;   Surgeon: Adelbert Homans, MD;  Location: [facility];  Service: Urology;  Laterality: N/A;   BOTOX  INJECTION N/A 10/25/2021   Procedure: CYSTOSCOPY WITH BOTOX  INJECTION 300 UNITS;  Surgeon: Adelbert Homans, MD;  Location: [facility];  Service: Urology;  Laterality: N/A;   BOTOX  INJECTION N/A 04/09/2022   Procedure: BOTOX  INJECTION 300 units WITH CYSTOSCOPY;  Surgeon: Adelbert Homans, MD;  Location: [facility];  Service: Urology;  Laterality: N/A;   BOTOX  INJECTION N/A 10/15/2022   Procedure: BOTOX   300 UNITSINJECTION;  Surgeon: Adelbert Homans, MD;  Location: [facility];  Service: Urology;  Laterality: N/A;   CATARACT EXTRACTION W/ INTRAOCULAR LENS IMPLANT Left 2022   COLONOSCOPY WITH PROPOFOL   01/10/2015   dr d. Grandville Lax   CYSTOSCOPY WITH INJECTION N/A 10/15/2022   Procedure: CYSTOSCOPY WITH INJECTION;  Surgeon: Adelbert Homans, MD;  Location: [facility];  Service: Urology;  Laterality: N/A;  30 MINS   CYSTOSCOPY WITH INJECTION N/A 04/24/2023   Procedure: CYSTOSCOPY WITH BOTOX  INJECTION 300 UNITS;  Surgeon: Adelbert Homans, MD;  Location: [facility];  Service: Urology;  Laterality: N/A;   CYSTOSCOPY WITH INJECTION N/A 10/14/2023   Procedure: CYSTOSCOPY, WITH INJECTION OF BLADDER NECK OR BLADDER WALL;  Surgeon: Adelbert Homans, MD;  Location: [facility];  Service: Urology;  Laterality: N/A;  CYSTOSCOPY WITH BOTOX  INJECTIONS (300 UNITS)   SHOULDER ARTHROSCOPY WITH  ROTATOR CUFF REPAIR AND SUBACROMIAL DECOMPRESSION  06/01/2012   Procedure: SHOULDER ARTHROSCOPY WITH ROTATOR CUFF REPAIR AND SUBACROMIAL DECOMPRESSION;  Surgeon: Verlinda Gloss, MD;  Location: [facility];  Service: Orthopedics;  Laterality: Left;  LEFT SHOULDER ARTHROSCOPY WITH SUBACROMIAL DECOMPRESION AND DEBRIDEMENT OF PARTIAL ROTATOR CUFF TEAR   TONSILLECTOMY AND  ADENOIDECTOMY  1962   TOTAL VAGINAL HYSTERECTOMY  12/01/2006   @[facility]  by dr m. Serge Dancer;   W/ ANTERIOR & POSTERIOR REPAIR AND SACROPINOUS LIGAMENT FIXATION OF VAGINAL VAULT    Social History   Socioeconomic History   Marital status: Married    Spouse name: Not on file   Number of children: 4   Years of education: Not on file   Highest education level: Bachelor's degree (e.g., BA, AB, BS)  Occupational History   Occupation: Disability  Tobacco Use   Smoking status: Never    Passive exposure: Never   Smokeless tobacco: Never  Vaping Use   Vaping status: Never Used  Substance and Sexual Activity   Alcohol use: Yes    Comment: 2 glasses wine per day   Drug use: No   Sexual activity: Not on file  Other Topics Concern   Not on file  Social History Narrative   Unable to volunteer    Now on disability   Children now out of home   Daily caffeine    Social Drivers of Health   Financial Resource Strain: Low Risk  (12/10/2023)   Overall Financial Resource Strain (CARDIA)    Difficulty of Paying Living Expenses: Not hard at all  Food Insecurity: No Food Insecurity (12/10/2023)   Hunger Vital Sign    Worried About Running Out of Food in the Last Year: Never true    Ran Out of Food in the Last Year: Never true  Transportation Needs: No Transportation Needs (12/10/2023)   PRAPARE - Administrator, Civil Service (Medical): No    Lack of Transportation (Non-Medical): No  Physical Activity: Inactive (12/10/2023)   Exercise Vital Sign    Days of Exercise per Week: 0 days    Minutes of Exercise per Session: 0 min  Stress: No Stress Concern Present (12/10/2023)   [employer] of Occupational Health - Occupational Stress Questionnaire    Feeling of Stress : Not at all  Social Connections: Socially Integrated (12/10/2023)   Social Connection and Isolation Panel [NHANES]    Frequency of Communication with Friends and Family: More than three times a week    Frequency of Social  Gatherings with Friends and Family: Three times a week    Attends Religious Services: More than 4 times per year    Active Member of Clubs or Organizations: Yes    Attends Banker Meetings: More than 4 times per year    Marital Status: Married  Catering manager Violence: Not At Risk (12/10/2023)   Humiliation, Afraid, Rape, and Kick questionnaire    Fear of Current or Ex-Partner: No    Emotionally Abused: No    Physically Abused: No    Sexually Abused: No    Family History  Problem Relation Age of Onset   Lung cancer Father    Colon cancer Neg Hx     Current Outpatient Medications on File Prior to Visit  Medication Sig Dispense Refill   amLODipine  (NORVASC ) 2.5 MG tablet TAKE 1 TABLET BY MOUTH DAILY 90 tablet 3   ascorbic acid (VITAMIN C) 500 MG tablet Take 500 mg by mouth daily.  Biotin 69629 MCG TABS Take 1 tablet by mouth daily at 6 (six) AM. 1000 mg daily     Calcium Citrate-Vitamin D  (CALCIUM CITRATE+D3 PO) Take 600 mg by mouth daily.     Cholecalciferol (VITAMIN D3) 50 MCG (2000 UT) capsule Take 2,000 Units by mouth daily.     CRANBERRY PO Take 500 mg by mouth daily. 500 mg     D-MANNOSE PO Take 1 capsule by mouth daily.     DULoxetine (CYMBALTA) 60 MG capsule Take 60 mg by mouth 2 (two) times daily.     esomeprazole  (NEXIUM ) 20 MG capsule Take 20 mg by mouth daily.     fluticasone (FLONASE) 50 MCG/ACT nasal spray Place 1 spray into both nostrils daily.     gabapentin (NEURONTIN) 300 MG capsule Take 600 mg by mouth at bedtime.     ibuprofen  (ADVIL ) 200 MG tablet Take 600 mg by mouth every 8 (eight) hours as needed for moderate pain (pain score 4-6).     losartan  (COZAAR ) 25 MG tablet TAKE 1 TABLET BY MOUTH DAILY FOR HIGH BLOOD PRESSURE INSTEAD OF VALSARTAN  90 tablet 1   Meth-Hyo-M Bl-Na Phos-Ph Sal (URIBEL) 118 MG CAPS Take 1 capsule by mouth daily as needed (bladder spasms). Using PRN     Probiotic Product (PROBIOTIC PEARLS) CAPS Take 1 capsule by mouth in the  morning and at bedtime.     Teriflunomide (AUBAGIO) 7 MG TABS Take 7 mg by mouth every morning.     tiZANidine (ZANAFLEX) 4 MG tablet Take 1-1.5 tabs po TID prn.     White Petrolatum -Mineral Oil (SYSTANE NIGHTTIME) OINT Place 1 Application into both eyes at bedtime.     zoledronic  acid (RECLAST ) 5 MG/100ML SOLN injection Inject 5 mg into the vein See admin instructions. 1 time a year     No current facility-administered medications on file prior to visit.    Allergies  Allergen Reactions   Meclizine Hcl     REACTION: Psychotic reaction   Tramadol Itching   Prilosec [Omeprazole  Magnesium ] Hives       Physical Exam Vitals requested from patient and listed below if patient had equipment and was able to obtain at home for this virtual visit: There were no vitals filed for this visit. Estimated body mass index is 25.46 kg/m as calculated from the following:   Height as of 11/04/23: 5\' 5"  (1.651 m).   Weight as of 11/04/23: 153 lb (69.4 kg).  EKG (optional): deferred due to virtual visit  GENERAL: alert, oriented, no acute distress detected, full vision exam deferred due to pandemic and/or virtual encounter   PSYCH/NEURO: pleasant and cooperative, no obvious depression or anxiety, speech and thought processing grossly intact, Cognitive function grossly intact  Flowsheet Row Clinical Support from 12/10/2023 in [facility]  PHQ-9 Total Score 6           12/10/2023    4:09 PM 11/04/2023    2:38 PM 10/07/2022    5:09 PM 09/18/2022    4:40 PM 05/13/2022   12:19 PM  Depression screen PHQ 2/9  Decreased Interest 0 0 0 0 0  Down, Depressed, Hopeless 0 0 0 0 0  PHQ - 2 Score 0 0 0 0 0  Altered sleeping 0  0 0 0  Tired, decreased energy 0  0 2 1  Change in appetite 0  0 0 0  Feeling bad or failure about yourself  0  0 0 0  Trouble concentrating  3  1 0 1  Moving slowly or fidgety/restless 3  1 0 1  Suicidal thoughts 0  0 0 0  PHQ-9 Score 6  2 2 3    Difficult doing work/chores Somewhat difficult  Somewhat difficult Not difficult at all Somewhat difficult       05/13/2022   12:19 PM 07/22/2022    6:20 PM 09/18/2022    4:38 PM 10/07/2022    5:09 PM 12/10/2023    4:09 PM  Fall Risk  Falls in the past year? 1  1 1  0  Number of falls in past year - Comments   compression fracture of spine    Was there an injury with Fall? 0  1 1 0  Was there an injury with Fall? - Comments   compression fracture of her spine    Fall Risk Category Calculator 1  3 2  0  Fall Risk Category (Retired) Low      (RETIRED) Patient Fall Risk Level Low fall risk Moderate fall risk     Patient at Risk for Falls Due to Impaired balance/gait   Other (Comment) No Fall Risks  Fall risk Follow up Falls evaluation completed   Falls evaluation completed Falls evaluation completed     SUMMARY AND PLAN:  Encounter for Medicare annual wellness exam  Discussed applicable health maintenance/preventive health measures and advised and referred or ordered per patient preferences: -had her bone density and mammogram last week w/ [facility], do not see report yet -she declined covid vaccine, discussed recs Health Maintenance  Topic Date Due   COVID-19 Vaccine (5 - 2024-25 season) 03/29/2023   INFLUENZA VACCINE  02/26/2024   MAMMOGRAM  05/13/2024   Medicare Annual Wellness (AWV)  12/09/2024   Colonoscopy  01/09/2025   DTaP/Tdap/Td (3 - Td or Tdap) 07/11/2025   Pneumonia Vaccine 39+ Years old  Completed   DEXA SCAN  Completed   Zoster Vaccines- Shingrix  Completed   HPV VACCINES  Aged Out   Meningococcal B Vaccine  Aged Out   Hepatitis C Screening  Discontinued      Education and counseling on the following was provided based on the above review of health and a plan/checklist for the patient, along with additional information discussed, was provided for the patient in the patient instructions :  -she has done balance exercises with PT, continues. Has improved  and has avoided falls.  -Advised and counseled on a healthy lifestyle - including the importance of a healthy diet, regular physical activity, social connections and stress management. -Reviewed patient's current diet. Advised and counseled on a whole foods based healthy diet. A summary of a healthy diet was provided in the Patient Instructions.  -reviewed patient's current physical activity level and discussed exercise guidelines for adults. Discussed community resources and ideas for safe exercise at home to assist in meeting exercise guideline recommendations in a safe and healthy way.  -Advise yearly dental visits at minimum and regular eye exams -Advised and counseled on alcohol safe limits Follow up: see patient instructions     Patient Instructions  I really enjoyed getting to talk with you today! I am available on Tuesdays and Thursdays for virtual visits if you have any questions or concerns, or if I can be of any further assistance.   CHECKLIST FROM ANNUAL WELLNESS VISIT:  -Follow up (please call to schedule if not scheduled after visit):   -yearly for annual wellness visit with primary care office  Here is a list of your  preventive care/health maintenance measures and the plan for each if any are due:  PLAN For any measures below that may be due:   Health Maintenance  Topic Date Due   COVID-19 Vaccine (5 - 2024-25 season) 03/29/2023   INFLUENZA VACCINE  02/26/2024   MAMMOGRAM  05/13/2024   Medicare Annual Wellness (AWV)  12/09/2024   Colonoscopy  01/09/2025   DTaP/Tdap/Td (3 - Td or Tdap) 07/11/2025   Pneumonia Vaccine 96+ Years old  Completed   DEXA SCAN  Completed   Zoster Vaccines- Shingrix  Completed   HPV VACCINES  Aged Out   Meningococcal B Vaccine  Aged Out   Hepatitis C Screening  Discontinued    -See a dentist at least yearly  -Get your eyes checked and then per your eye specialist's recommendations  -Other issues addressed today:   -I have included  below further information regarding a healthy whole foods based diet, physical activity guidelines for adults, stress management and opportunities for social connections. I hope you find this information useful.   -----------------------------------------------------------------------------------------------------------------------------------------------------------------------------------------------------------------------------------------------------------    NUTRITION: -eat real food: lots of colorful vegetables (half the plate) and fruits -5-7 servings of vegetables and fruits per day (fresh or steamed is best), exp. 2 servings of vegetables with lunch and dinner and 2 servings of fruit per day. Berries and greens such as kale and collards are great choices.  -consume on a regular basis:  fresh fruits, fresh veggies, fish, nuts, seeds, healthy oils (such as olive oil, avocado oil), whole grains (make sure for bread/pasta/crackers/etc., that the first ingredient on label contains the word "whole"), legumes. -can eat small amounts of dairy and lean meat (no larger than the palm of your hand), but avoid processed meats such as ham, bacon, lunch meat, etc. -drink water  -try to avoid fast food and pre-packaged foods, processed meat, ultra processed foods/beverages (donuts, candy, etc.) -most experts advise limiting sodium to < 2300mg  per day, should limit further is any chronic conditions such as high blood pressure, heart disease, diabetes, etc. The American Heart Association advised that < 1500mg  is is ideal -try to avoid foods/beverages that contain any ingredients with names you do not recognize  -try to avoid foods/beverages  with added sugar or sweeteners/sweets  -try to avoid sweet drinks (including diet drinks): soda, juice, Gatorade, sweet tea, power drinks, diet drinks -try to avoid white rice, white bread, pasta (unless whole grain)  EXERCISE GUIDELINES FOR ADULTS: -if you wish to  increase your physical activity, do so gradually and with the approval of your doctor -STOP and seek medical care immediately if you have any chest pain, chest discomfort or trouble breathing when starting or increasing exercise  -move and stretch your body, legs, feet and arms when sitting for long periods -Physical activity guidelines for optimal health in adults: -get at least 150 minutes per week of moderate exercise (can talk, but not sing); this is about 20-30 minutes of sustained activity 5-7 days per week or two 10-15 minute episodes of sustained activity 5-7 days per week -do some muscle building/resistance training/strength training at least 2 days per week  -balance exercises 3+ days per week:   Stand somewhere where you have something sturdy to hold onto if you lose balance    1) lift up on toes, then back down, start with 5x per day and work up to 20x   2) stand and lift one leg straight out to the side so that foot is a few inches of the floor,  start with 5x each side and work up to 20x each side   3) stand on one foot, start with 5 seconds each side and work up to 20 seconds on each side  If you need ideas or help with getting more active:  -Silver sneakers https://tools.silversneakers.com  -Walk with a Doc: http://www.duncan-williams.com/  -try to include resistance (weight lifting/strength building) and balance exercises twice per week: or the following link for ideas: http://castillo-powell.com/  BuyDucts.dk  STRESS MANAGEMENT: -can try meditating, or just sitting quietly with deep breathing while intentionally relaxing all parts of your body for 5 minutes daily -if you need further help with stress, anxiety or depression please follow up with your primary doctor or contact the wonderful folks at [facility]: [phone]  SOCIAL CONNECTIONS: -options in [place] if you  wish to engage in more social and exercise related activities:  -Silver sneakers https://tools.silversneakers.com  -Walk with a Doc: http://www.duncan-williams.com/  -Check out the [facility] Active Adults 50+ section on the [place] of [employer] (hiking clubs, book clubs, cards and games, chess, exercise classes, aquatic classes and much more) - see the website for details: https://www.[place].gov/departments/parks-recreation/active-adults50  -YouTube has lots of exercise videos for different ages and abilities as well  -[facility] (a variety of indoor and outdoor inperson activities for adults). ([phone])324-8927. [address]  -Virtual Online Classes (a variety of topics): see seniorplanet.org or call ([phone]  -consider volunteering at a school, hospice center, church, senior center or elsewhere            Maurie Southern, DO

## 2023-12-10 NOTE — Patient Instructions (Addendum)
 I really enjoyed getting to talk with you today! I am available on Tuesdays and Thursdays for virtual visits if you have any questions or concerns, or if I can be of any further assistance.   CHECKLIST FROM ANNUAL WELLNESS VISIT:  -Follow up (please call to schedule if not scheduled after visit):   -yearly for annual wellness visit with primary care office  Here is a list of your preventive care/health maintenance measures and the plan for each if any are due:  PLAN For any measures below that may be due:   Health Maintenance  Topic Date Due   COVID-19 Vaccine (5 - 2024-25 season) 03/29/2023   INFLUENZA VACCINE  02/26/2024   MAMMOGRAM  05/13/2024   Medicare Annual Wellness (AWV)  12/09/2024   Colonoscopy  01/09/2025   DTaP/Tdap/Td (3 - Td or Tdap) 07/11/2025   Pneumonia Vaccine 101+ Years old  Completed   DEXA SCAN  Completed   Zoster Vaccines- Shingrix  Completed   HPV VACCINES  Aged Out   Meningococcal B Vaccine  Aged Out   Hepatitis C Screening  Discontinued    -See a dentist at least yearly  -Get your eyes checked and then per your eye specialist's recommendations  -Other issues addressed today:   -I have included below further information regarding a healthy whole foods based diet, physical activity guidelines for adults, stress management and opportunities for social connections. I hope you find this information useful.   -----------------------------------------------------------------------------------------------------------------------------------------------------------------------------------------------------------------------------------------------------------    NUTRITION: -eat real food: lots of colorful vegetables (half the plate) and fruits -5-7 servings of vegetables and fruits per day (fresh or steamed is best), exp. 2 servings of vegetables with lunch and dinner and 2 servings of fruit per day. Berries and greens such as kale and collards are great  choices.  -consume on a regular basis:  fresh fruits, fresh veggies, fish, nuts, seeds, healthy oils (such as olive oil, avocado oil), whole grains (make sure for bread/pasta/crackers/etc., that the first ingredient on label contains the word "whole"), legumes. -can eat small amounts of dairy and lean meat (no larger than the palm of your hand), but avoid processed meats such as ham, bacon, lunch meat, etc. -drink water  -try to avoid fast food and pre-packaged foods, processed meat, ultra processed foods/beverages (donuts, candy, etc.) -most experts advise limiting sodium to < 2300mg  per day, should limit further is any chronic conditions such as high blood pressure, heart disease, diabetes, etc. The American Heart Association advised that < 1500mg  is is ideal -try to avoid foods/beverages that contain any ingredients with names you do not recognize  -try to avoid foods/beverages  with added sugar or sweeteners/sweets  -try to avoid sweet drinks (including diet drinks): soda, juice, Gatorade, sweet tea, power drinks, diet drinks -try to avoid white rice, white bread, pasta (unless whole grain)  EXERCISE GUIDELINES FOR ADULTS: -if you wish to increase your physical activity, do so gradually and with the approval of your doctor -STOP and seek medical care immediately if you have any chest pain, chest discomfort or trouble breathing when starting or increasing exercise  -move and stretch your body, legs, feet and arms when sitting for long periods -Physical activity guidelines for optimal health in adults: -get at least 150 minutes per week of moderate exercise (can talk, but not sing); this is about 20-30 minutes of sustained activity 5-7 days per week or two 10-15 minute episodes of sustained activity 5-7 days per week -do some muscle building/resistance training/strength training at least  2 days per week  -balance exercises 3+ days per week:   Stand somewhere where you have something sturdy to  hold onto if you lose balance    1) lift up on toes, then back down, start with 5x per day and work up to 20x   2) stand and lift one leg straight out to the side so that foot is a few inches of the floor, start with 5x each side and work up to 20x each side   3) stand on one foot, start with 5 seconds each side and work up to 20 seconds on each side  If you need ideas or help with getting more active:  -Silver sneakers https://tools.silversneakers.com  -Walk with a Doc: http://www.duncan-williams.com/  -try to include resistance (weight lifting/strength building) and balance exercises twice per week: or the following link for ideas: http://castillo-powell.com/  BuyDucts.dk  STRESS MANAGEMENT: -can try meditating, or just sitting quietly with deep breathing while intentionally relaxing all parts of your body for 5 minutes daily -if you need further help with stress, anxiety or depression please follow up with your primary doctor or contact the wonderful folks at [facility]: [phone]  SOCIAL CONNECTIONS: -options in [place] if you wish to engage in more social and exercise related activities:  -Silver sneakers https://tools.silversneakers.com  -Walk with a Doc: http://www.duncan-williams.com/  -Check out the [facility] Active Adults 50+ section on the [place] [employer] (hiking clubs, book clubs, cards and games, chess, exercise classes, aquatic classes and much more) - see the website for details: https://www.[place].gov/departments/parks-recreation/active-adults50  -YouTube has lots of exercise videos for different ages and abilities as well  -[facility] (a variety of indoor and outdoor inperson activities for adults). (573)666-1980. [address]  -Virtual Online Classes (a variety of topics): see seniorplanet.org or call  3192986121  -consider volunteering at a school, hospice center, church, senior center or elsewhere

## 2023-12-10 NOTE — Progress Notes (Signed)
 Patient unable to obtain vital signs due to telehealth visit

## 2023-12-15 DIAGNOSIS — M25579 Pain in unspecified ankle and joints of unspecified foot: Secondary | ICD-10-CM | POA: Diagnosis not present

## 2023-12-21 DIAGNOSIS — M25579 Pain in unspecified ankle and joints of unspecified foot: Secondary | ICD-10-CM | POA: Diagnosis not present

## 2023-12-29 DIAGNOSIS — M25579 Pain in unspecified ankle and joints of unspecified foot: Secondary | ICD-10-CM | POA: Diagnosis not present

## 2023-12-30 DIAGNOSIS — N3020 Other chronic cystitis without hematuria: Secondary | ICD-10-CM | POA: Diagnosis not present

## 2024-01-05 DIAGNOSIS — M25579 Pain in unspecified ankle and joints of unspecified foot: Secondary | ICD-10-CM | POA: Diagnosis not present

## 2024-01-12 DIAGNOSIS — M25579 Pain in unspecified ankle and joints of unspecified foot: Secondary | ICD-10-CM | POA: Diagnosis not present

## 2024-01-26 DIAGNOSIS — M25579 Pain in unspecified ankle and joints of unspecified foot: Secondary | ICD-10-CM | POA: Diagnosis not present

## 2024-02-02 DIAGNOSIS — M25579 Pain in unspecified ankle and joints of unspecified foot: Secondary | ICD-10-CM | POA: Diagnosis not present

## 2024-02-09 DIAGNOSIS — M25579 Pain in unspecified ankle and joints of unspecified foot: Secondary | ICD-10-CM | POA: Diagnosis not present

## 2024-02-12 DIAGNOSIS — H04123 Dry eye syndrome of bilateral lacrimal glands: Secondary | ICD-10-CM | POA: Diagnosis not present

## 2024-02-12 DIAGNOSIS — H26492 Other secondary cataract, left eye: Secondary | ICD-10-CM | POA: Diagnosis not present

## 2024-02-16 DIAGNOSIS — K929 Disease of digestive system, unspecified: Secondary | ICD-10-CM | POA: Diagnosis not present

## 2024-02-16 DIAGNOSIS — R197 Diarrhea, unspecified: Secondary | ICD-10-CM | POA: Diagnosis not present

## 2024-02-22 DIAGNOSIS — H26492 Other secondary cataract, left eye: Secondary | ICD-10-CM | POA: Diagnosis not present

## 2024-02-24 DIAGNOSIS — M25579 Pain in unspecified ankle and joints of unspecified foot: Secondary | ICD-10-CM | POA: Diagnosis not present

## 2024-02-29 DIAGNOSIS — R197 Diarrhea, unspecified: Secondary | ICD-10-CM | POA: Diagnosis not present

## 2024-03-02 DIAGNOSIS — N3020 Other chronic cystitis without hematuria: Secondary | ICD-10-CM | POA: Diagnosis not present

## 2024-03-03 DIAGNOSIS — M25579 Pain in unspecified ankle and joints of unspecified foot: Secondary | ICD-10-CM | POA: Diagnosis not present

## 2024-03-08 ENCOUNTER — Other Ambulatory Visit: Payer: Self-pay | Admitting: Urology

## 2024-03-12 NOTE — Patient Instructions (Signed)
 SURGICAL WAITING ROOM VISITATION Patients having surgery or a procedure may have no more than 2 support people in the waiting area - these visitors may rotate in the visitor waiting room.   If the patient needs to stay at the hospital during part of their recovery, the visitor guidelines for inpatient rooms apply.  PRE-OP VISITATION  Pre-op nurse will coordinate an appropriate time for 1 support person to accompany the patient in pre-op.  This support person may not rotate.  This visitor will be contacted when the time is appropriate for the visitor to come back in the pre-op area.  Please refer to the [facility] website for the visitor guidelines for Inpatients (after your surgery is over and you are in a regular room).  You are not required to quarantine at this time prior to your surgery. However, you must do this: Hand Hygiene often Do NOT share personal items Notify your provider if you are in close contact with someone who has COVID or you develop fever 100.4 or greater, new onset of sneezing, cough, sore throat, shortness of breath or body aches.  If you test positive for Covid or have been in contact with anyone that has tested positive in the last 10 days please notify you surgeon.    Your procedure is scheduled on: Wednesday  August 20   Report to [facility] Main Entrance: [address] entrance where the [employer] is available.   Report to admitting at: 07:15 AM  Call this number if you have any questions or problems the morning of surgery [phone]  DO NOT EAT OR DRINK ANYTHING AFTER MIDNIGHT THE NIGHT PRIOR TO YOUR SURGERY / PROCEDURE.   FOLLOW  ANY ADDITIONAL PRE OP INSTRUCTIONS YOU RECEIVED FROM YOUR SURGEON'S OFFICE!!!   Oral Hygiene is also important to reduce your risk of infection.        Remember - BRUSH YOUR TEETH THE MORNING OF SURGERY WITH YOUR REGULAR TOOTHPASTE  Do NOT smoke after Midnight the night before surgery.  STOP TAKING all Vitamins,  Herbs and supplements 1 week before your surgery.   Take ONLY these medicines the morning of surgery with A SIP OF WATER : Esomeprazole , Amlodipine , and you may use your Flonase nasal spray if needed.   DO NOT TAKE Losartan  the morning of your surgery.   You may not have any metal on your body including hair pins, jewelry, and body piercing  Do not wear make-up, lotions, powders, perfumes or deodorant  Do not wear nail polish including gel and S&S, artificial / acrylic nails, or any other type of covering on natural nails including finger and toenails. If you have artificial nails, gel coating, etc., that needs to be removed by a nail salon, Please have this removed prior to surgery. Not doing so may mean that your surgery could be cancelled or delayed if the Surgeon or anesthesia staff feels like they are unable to monitor you safely.   Do not shave 48 hours prior to surgery to avoid nicks in your skin which may contribute to postoperative infections.   Contacts, Hearing Aids, dentures or bridgework may not be worn into surgery. DENTURES WILL BE REMOVED PRIOR TO SURGERY PLEASE DO NOT APPLY Poly grip OR ADHESIVES!!!  You may bring a small overnight bag with you on the day of surgery, only pack items that are not valuable.  IS NOT RESPONSIBLE   FOR VALUABLES THAT ARE LOST OR STOLEN.   Do not bring your home medications  to the hospital. The Pharmacy will dispense medications listed on your medication list to you during your admission in the Hospital.  Please read over the following fact sheets you were given: IF YOU HAVE QUESTIONS ABOUT YOUR PRE-OP INSTRUCTIONS, PLEASE CALL [phone].   [place] - Preparing for Surgery Before surgery, you can play an important role.  Because skin is not sterile, your skin needs to be as free of germs as possible.  You can reduce the number of germs on your skin by washing with CHG (chlorahexidine gluconate) soap before surgery.  CHG is an  antiseptic cleaner which kills germs and bonds with the skin to continue killing germs even after washing. Please DO NOT use if you have an allergy to CHG or antibacterial soaps.  If your skin becomes reddened/irritated stop using the CHG and inform your nurse when you arrive at Short Stay. Do not shave (including legs and underarms) for at least 48 hours prior to the first CHG shower.  You may shave your face/neck.  Please follow these instructions carefully:  1.  Shower with CHG Soap the night before surgery and the  morning of surgery.  2.  If you choose to wash your hair, wash your hair first as usual with your normal  shampoo.  3.  After you shampoo, rinse your hair and body thoroughly to remove the shampoo.                             4.  Use CHG as you would any other liquid soap.  You can apply chg directly to the skin and wash.  Gently with a scrungie or clean washcloth.  5.  Apply the CHG Soap to your body ONLY FROM THE NECK DOWN.   Do not use on face/ open                           Wound or open sores. Avoid contact with eyes, ears mouth and genitals (private parts).                       Wash face,  Genitals (private parts) with your normal soap.             6.  Wash thoroughly, paying special attention to the area where your  surgery  will be performed.  7.  Thoroughly rinse your body with warm water  from the neck down.  8.  DO NOT shower/wash with your normal soap after using and rinsing off the CHG Soap.            9.  Pat yourself dry with a clean towel.            10.  Wear clean pajamas.            11.  Place clean sheets on your bed the night of your first shower and do not  sleep with pets.  ON THE DAY OF SURGERY : Do not apply any lotions/deodorants the morning of surgery.  Please wear clean clothes to the hospital/surgery center.    FAILURE TO FOLLOW THESE INSTRUCTIONS MAY RESULT IN THE CANCELLATION OF YOUR SURGERY  PATIENT  SIGNATURE_________________________________  NURSE SIGNATURE__________________________________  ________________________________________________________________________

## 2024-03-12 NOTE — Progress Notes (Signed)
 COVID Vaccine received:  []  No [x]  Yes Date of any COVID positive Test in last 69 days:none  PCP - Apolinar Eastern, MD [phone] (Work) [phone] (Fax)  Cardiologist - none Neurology- Sharri Fortune, MD at [facility]- [phone]    Chest x-ray - 07-22-2022  2v  Epic EKG - 10-14-23  Epic Stress Test -  ECHO -  Cardiac Cath -  CT Coronary Calcium score:   Bowel Prep - [x]  No  []   Yes ______  Pacemaker / ICD device [x]  No []  Yes   Spinal Cord Stimulator:[x]  No []  Yes       History of Sleep Apnea? [x]  No []  Yes  neg. study CPAP used?- [x]  No []  Yes    Patient has: [x]  NO Hx DM   []  Pre-DM   []  DM1  []   DM2 Does the patient monitor blood sugar?   [x]  N/A   []  No []  Yes   Blood Thinner / Instructions:  none Aspirin Instructions:  none  ERAS Protocol Ordered: [x]  No  []  Yes Patient is to be NPO after: MN Prior  Dental hx: []  Dentures:  []  N/A      []  Bridge or Partial:                   [x]  Loose or Damaged teeth:   Activity level: Difficulty climbing stairs due to MS.  Able to perform activities of daily living without stopping and without symptoms of chest pain or shortness of breath.  Patient states that she ambulates with the use of a rollator   Anesthesia review: MS, HTN, does self caths d/t neurogenic bladder, Sjogren's syn.   Patient denies any S&S of respiratory illness or Covid - no shortness of breath, fever, cough or chest pain at PAT appointment.  Patient verbalized understanding and agreement to the Pre-Surgical Instructions that were given to them at this PAT appointment. Patient was also educated of the need to review these PAT instructions again prior to her surgery.I reviewed the appropriate phone numbers to call if they have any and questions or concerns.

## 2024-03-14 ENCOUNTER — Encounter (HOSPITAL_COMMUNITY)
Admission: RE | Admit: 2024-03-14 | Discharge: 2024-03-14 | Disposition: A | Discharge status: Home / Self Care | Source: Ambulatory Visit | Attending: Urology | Admitting: Urology

## 2024-03-14 ENCOUNTER — Other Ambulatory Visit: Payer: Self-pay

## 2024-03-14 ENCOUNTER — Encounter (HOSPITAL_COMMUNITY): Payer: Self-pay

## 2024-03-14 VITALS — BP 139/74 | HR 82 | Temp 98.1°F | Resp 16 | Ht 65.0 in | Wt 150.0 lb

## 2024-03-14 DIAGNOSIS — Z01812 Encounter for preprocedural laboratory examination: Secondary | ICD-10-CM | POA: Diagnosis not present

## 2024-03-14 DIAGNOSIS — I10 Essential (primary) hypertension: Secondary | ICD-10-CM | POA: Diagnosis not present

## 2024-03-14 DIAGNOSIS — G35 Multiple sclerosis: Secondary | ICD-10-CM | POA: Diagnosis not present

## 2024-03-14 DIAGNOSIS — Z79899 Other long term (current) drug therapy: Secondary | ICD-10-CM | POA: Diagnosis not present

## 2024-03-14 DIAGNOSIS — Z01818 Encounter for other preprocedural examination: Secondary | ICD-10-CM

## 2024-03-14 HISTORY — DX: Personal history of urinary calculi: Z87.442

## 2024-03-14 HISTORY — DX: Myoneural disorder, unspecified: G70.9

## 2024-03-14 HISTORY — DX: Chronic kidney disease, unspecified: N18.9

## 2024-03-14 LAB — COMPREHENSIVE METABOLIC PANEL WITH GFR
ALT: 44 U/L (ref 0–44)
AST: 35 U/L (ref 15–41)
Albumin: 3.8 g/dL (ref 3.5–5.0)
Alkaline Phosphatase: 71 U/L (ref 38–126)
Anion gap: 12 (ref 5–15)
BUN: 18 mg/dL (ref 8–23)
CO2: 23 mmol/L (ref 22–32)
Calcium: 9.2 mg/dL (ref 8.9–10.3)
Chloride: 106 mmol/L (ref 98–111)
Creatinine, Ser: 0.51 mg/dL (ref 0.44–1.00)
GFR, Estimated: >60 mL/min (ref >60)
Glucose, Bld: 100 mg/dL — ABNORMAL HIGH (ref 70–99)
Potassium: 3.9 mmol/L (ref 3.5–5.1)
Sodium: 141 mmol/L (ref 135–145)
Total Bilirubin: 0.9 mg/dL (ref 0.0–1.2)
Total Protein: 6.7 g/dL (ref 6.5–8.1)

## 2024-03-14 LAB — CBC
HCT: 41.4 % (ref 36.0–46.0)
Hemoglobin: 13.7 g/dL (ref 12.0–15.0)
MCH: 31.4 pg (ref 26.0–34.0)
MCHC: 33.1 g/dL (ref 30.0–36.0)
MCV: 95 fL (ref 80.0–100.0)
Platelets: 222 K/uL (ref 150–400)
RBC: 4.36 MIL/uL (ref 3.87–5.11)
RDW: 12.2 % (ref 11.5–15.5)
WBC: 6.4 K/uL (ref 4.0–10.5)
nRBC: 0 % (ref 0.0–0.2)

## 2024-03-15 DIAGNOSIS — K649 Unspecified hemorrhoids: Secondary | ICD-10-CM | POA: Diagnosis not present

## 2024-03-15 DIAGNOSIS — R197 Diarrhea, unspecified: Secondary | ICD-10-CM | POA: Diagnosis not present

## 2024-03-15 NOTE — Anesthesia Preprocedure Evaluation (Signed)
 Anesthesia Evaluation  Patient identified by MRN, date of birth, ID band Patient awake    Reviewed: Allergy & Precautions, H&P , NPO status , Patient's Chart, lab work & pertinent test results  Airway Mallampati: II  TM Distance: >3 FB Neck ROM: Full    Dental no notable dental hx. (+) Teeth Intact, Dental Advisory Given   Pulmonary neg pulmonary ROS   Pulmonary exam normal breath sounds clear to auscultation       Cardiovascular Exercise Tolerance: Good hypertension, Pt. on medications  Rhythm:Regular Rate:Normal     Neuro/Psych  Neuromuscular disease  negative psych ROS   GI/Hepatic Neg liver ROS,GERD  Medicated,,  Endo/Other  negative endocrine ROS    Renal/GU negative Renal ROS   Urge Incontinence negative genitourinary   Musculoskeletal  (+) Arthritis , Osteoarthritis,    Abdominal   Peds  Hematology negative hematology ROS (+)   Anesthesia Other Findings   Reproductive/Obstetrics negative OB ROS                              Anesthesia Physical Anesthesia Plan  ASA: 3  Anesthesia Plan: MAC   Post-op Pain Management: Tylenol  PO (pre-op)*   Induction: Intravenous  PONV Risk Score and Plan: 3 and Propofol  infusion, Ondansetron  and Treatment may vary due to age or medical condition  Airway Management Planned: Natural Airway and Simple Face Mask  Additional Equipment:   Intra-op Plan:   Post-operative Plan:   Informed Consent: I have reviewed the patients History and Physical, chart, labs and discussed the procedure including the risks, benefits and alternatives for the proposed anesthesia with the patient or authorized representative who has indicated his/her understanding and acceptance.     Dental advisory given  Plan Discussed with: CRNA  Anesthesia Plan Comments:          Anesthesia Quick Evaluation

## 2024-03-16 ENCOUNTER — Ambulatory Visit (HOSPITAL_BASED_OUTPATIENT_CLINIC_OR_DEPARTMENT_OTHER): Admitting: Certified Registered"

## 2024-03-16 ENCOUNTER — Ambulatory Visit (HOSPITAL_COMMUNITY)
Admission: RE | Admit: 2024-03-16 | Discharge: 2024-03-16 | Disposition: A | Discharge status: Home / Self Care | Attending: Urology | Admitting: Urology

## 2024-03-16 ENCOUNTER — Encounter (HOSPITAL_COMMUNITY)
Admission: RE | Disposition: A | Discharge status: Home / Self Care | Payer: Self-pay | Source: Home / Self Care | Attending: Urology

## 2024-03-16 ENCOUNTER — Ambulatory Visit (HOSPITAL_COMMUNITY): Admitting: Physician Assistant

## 2024-03-16 ENCOUNTER — Encounter (HOSPITAL_COMMUNITY): Payer: Self-pay | Admitting: Urology

## 2024-03-16 DIAGNOSIS — N3941 Urge incontinence: Secondary | ICD-10-CM | POA: Diagnosis present

## 2024-03-16 DIAGNOSIS — I10 Essential (primary) hypertension: Secondary | ICD-10-CM

## 2024-03-16 DIAGNOSIS — N319 Neuromuscular dysfunction of bladder, unspecified: Secondary | ICD-10-CM | POA: Diagnosis not present

## 2024-03-16 DIAGNOSIS — N3946 Mixed incontinence: Secondary | ICD-10-CM | POA: Insufficient documentation

## 2024-03-16 DIAGNOSIS — G35 Multiple sclerosis: Secondary | ICD-10-CM

## 2024-03-16 DIAGNOSIS — Z79899 Other long term (current) drug therapy: Secondary | ICD-10-CM | POA: Diagnosis not present

## 2024-03-16 DIAGNOSIS — Z87440 Personal history of urinary (tract) infections: Secondary | ICD-10-CM | POA: Diagnosis not present

## 2024-03-16 DIAGNOSIS — K5900 Constipation, unspecified: Secondary | ICD-10-CM | POA: Diagnosis not present

## 2024-03-16 DIAGNOSIS — K219 Gastro-esophageal reflux disease without esophagitis: Secondary | ICD-10-CM | POA: Diagnosis not present

## 2024-03-16 DIAGNOSIS — M1990 Unspecified osteoarthritis, unspecified site: Secondary | ICD-10-CM | POA: Diagnosis not present

## 2024-03-16 HISTORY — PX: CYSTOSCOPY WITH INJECTION: SHX1424

## 2024-03-16 SURGERY — CYSTOSCOPY, WITH INJECTION OF BLADDER NECK OR BLADDER WALL
Anesthesia: Monitor Anesthesia Care | Site: Bladder

## 2024-03-16 MED ORDER — ONABOTULINUMTOXINA 100 UNITS IJ SOLR
INTRAMUSCULAR | Status: AC
  Filled 2024-03-16: qty 300

## 2024-03-16 MED ORDER — PROPOFOL 10 MG/ML IV BOLUS
INTRAVENOUS | Status: DC | PRN
  Administered 2024-03-16 (×2): 20 mg via INTRAVENOUS

## 2024-03-16 MED ORDER — PROPOFOL 10 MG/ML IV BOLUS
INTRAVENOUS | Status: AC
  Filled 2024-03-16: qty 20

## 2024-03-16 MED ORDER — ONABOTULINUMTOXINA 100 UNITS IJ SOLR
300.0000 [IU] | Freq: Once | INTRAMUSCULAR | Status: DC
  Filled 2024-03-16: qty 300

## 2024-03-16 MED ORDER — SODIUM CHLORIDE (PF) 0.9 % IJ SOLN
INTRAMUSCULAR | Status: DC | PRN
  Administered 2024-03-16: 30 mL

## 2024-03-16 MED ORDER — EPHEDRINE SULFATE-NACL 50-0.9 MG/10ML-% IV SOSY
PREFILLED_SYRINGE | INTRAVENOUS | Status: DC | PRN
  Administered 2024-03-16: 5 mg via INTRAVENOUS

## 2024-03-16 MED ORDER — ONABOTULINUMTOXINA 100 UNITS IJ SOLR
INTRAMUSCULAR | Status: DC | PRN
  Administered 2024-03-16: 300 [IU] via INTRAMUSCULAR

## 2024-03-16 MED ORDER — MIDAZOLAM HCL 2 MG/2ML IJ SOLN
INTRAMUSCULAR | Status: AC
  Filled 2024-03-16: qty 2

## 2024-03-16 MED ORDER — FENTANYL CITRATE (PF) 100 MCG/2ML IJ SOLN
INTRAMUSCULAR | Status: DC | PRN
  Administered 2024-03-16: 50 ug via INTRAVENOUS

## 2024-03-16 MED ORDER — SODIUM CHLORIDE (PF) 0.9 % IJ SOLN
INTRAMUSCULAR | Status: AC
  Filled 2024-03-16: qty 30

## 2024-03-16 MED ORDER — LIDOCAINE 2% (20 MG/ML) 5 ML SYRINGE
INTRAMUSCULAR | Status: DC | PRN
  Administered 2024-03-16: 60 mg via INTRAVENOUS

## 2024-03-16 MED ORDER — FENTANYL CITRATE PF 50 MCG/ML IJ SOSY: 25.0000 ug | PREFILLED_SYRINGE | INTRAMUSCULAR | Status: DC | PRN

## 2024-03-16 MED ORDER — ONDANSETRON HCL 4 MG/2ML IJ SOLN
INTRAMUSCULAR | Status: DC | PRN
  Administered 2024-03-16: 4 mg via INTRAVENOUS

## 2024-03-16 MED ORDER — PHENYLEPHRINE 80 MCG/ML (10ML) SYRINGE FOR IV PUSH (FOR BLOOD PRESSURE SUPPORT)
PREFILLED_SYRINGE | INTRAVENOUS | Status: AC
  Filled 2024-03-16: qty 10

## 2024-03-16 MED ORDER — MIDAZOLAM HCL 2 MG/2ML IJ SOLN
INTRAMUSCULAR | Status: DC | PRN
  Administered 2024-03-16: 2 mg via INTRAVENOUS

## 2024-03-16 MED ORDER — DEXAMETHASONE SODIUM PHOSPHATE 10 MG/ML IJ SOLN
INTRAMUSCULAR | Status: AC
  Filled 2024-03-16: qty 1

## 2024-03-16 MED ORDER — PROPOFOL 500 MG/50ML IV EMUL
INTRAVENOUS | Status: AC
Start: 2024-03-16 — End: 2024-03-16
  Filled 2024-03-16: qty 50

## 2024-03-16 MED ORDER — GENTAMICIN SULFATE 40 MG/ML IJ SOLN
5.0000 mg/kg | INTRAVENOUS | Status: AC
  Administered 2024-03-16: 347.2 mg via INTRAVENOUS
  Filled 2024-03-16: qty 8.75

## 2024-03-16 MED ORDER — EPHEDRINE 5 MG/ML INJ
INTRAVENOUS | Status: AC
Start: 2024-03-16 — End: 2024-03-16
  Filled 2024-03-16: qty 5

## 2024-03-16 MED ORDER — CHLORHEXIDINE GLUCONATE 0.12 % MT SOLN
15.0000 mL | Freq: Once | OROMUCOSAL | Status: AC
  Administered 2024-03-16: 15 mL via OROMUCOSAL

## 2024-03-16 MED ORDER — FENTANYL CITRATE (PF) 100 MCG/2ML IJ SOLN
INTRAMUSCULAR | Status: AC
  Filled 2024-03-16: qty 2

## 2024-03-16 MED ORDER — PHENYLEPHRINE 80 MCG/ML (10ML) SYRINGE FOR IV PUSH (FOR BLOOD PRESSURE SUPPORT)
PREFILLED_SYRINGE | INTRAVENOUS | Status: DC | PRN
  Administered 2024-03-16: 80 ug via INTRAVENOUS
  Administered 2024-03-16 (×2): 40 ug via INTRAVENOUS

## 2024-03-16 MED ORDER — PROPOFOL 500 MG/50ML IV EMUL
INTRAVENOUS | Status: DC | PRN
  Administered 2024-03-16: 150 ug/kg/min via INTRAVENOUS

## 2024-03-16 MED ORDER — ONDANSETRON HCL 4 MG/2ML IJ SOLN
INTRAMUSCULAR | Status: AC
  Filled 2024-03-16: qty 2

## 2024-03-16 MED ORDER — LACTATED RINGERS IV SOLN: INTRAVENOUS | Status: DC

## 2024-03-16 MED ORDER — STERILE WATER FOR IRRIGATION IR SOLN
Status: DC | PRN
  Administered 2024-03-16: 3000 mL

## 2024-03-16 MED ORDER — ORAL CARE MOUTH RINSE: 15.0000 mL | Freq: Once | OROMUCOSAL | Status: AC

## 2024-03-16 MED ORDER — DEXAMETHASONE SODIUM PHOSPHATE 10 MG/ML IJ SOLN
INTRAMUSCULAR | Status: DC | PRN
  Administered 2024-03-16: 4 mg via INTRAVENOUS

## 2024-03-16 SURGICAL SUPPLY — 14 items
BAG URO CATCHER STRL LF (MISCELLANEOUS) ×1 IMPLANT
CLOTH BEACON ORANGE TIMEOUT ST (SAFETY) ×1 IMPLANT
GLOVE SURG LX STRL 8.0 MICRO (GLOVE) ×1 IMPLANT
GOWN STRL REUS W/ TWL XL LVL3 (GOWN DISPOSABLE) ×2 IMPLANT
KIT TURNOVER KIT A (KITS) ×1 IMPLANT
MANIFOLD NEPTUNE II (INSTRUMENTS) ×1 IMPLANT
NDL ASPIRATION 22 (NEEDLE) ×1 IMPLANT
NDL SAFETY ECLIPSE 18X1.5 (NEEDLE) IMPLANT
NEEDLE ASPIRATION 22 (NEEDLE) ×1 IMPLANT
PACK CYSTO (CUSTOM PROCEDURE TRAY) ×1 IMPLANT
PENCIL SMOKE EVACUATOR (MISCELLANEOUS) IMPLANT
SYR CONTROL 10ML LL (SYRINGE) IMPLANT
TUBING CONNECTING 10 (TUBING) IMPLANT
WATER STERILE IRR 3000ML UROMA (IV SOLUTION) ×1 IMPLANT

## 2024-03-16 NOTE — Op Note (Signed)
 Operative Note  Preoperative diagnosis:  1.  Neurogenic bladder with incontinence 2.  Multiple sclerosis  Postoperative diagnosis: 1.  Neurogenic bladder with incontinence 2.  Multiple sclerosis  Procedure(s): 1.  Cystoscopy with intravesical Botox  injections (300 units)  Surgeon: Lonni Han, MD  Assistants:  None  Anesthesia:  General  Complications:  None  EBL: Less than 5 mL  Specimens: 1.  None  Drains/Catheters: 1.  None  Intraoperative findings:   Severe bladder trabeculation with no other intravesical or urethral abnormalities  Indication:  [name] is a 68 y.o. female with a history of multiple sclerosis resulting in neurogenic bladder with incontinence.  She performs CIC throughout the day to manage her bladder.  She notes worsening urge incontinence over the past several weeks and is here today for repeat Botox  injections.  She has been consented for the above procedures, voices understanding and wishes to proceed.  Description of procedure:  After informed consent was obtained, the patient was brought to the operating room and general LMA anesthesia was administered. The patient was then placed in the dorsolithotomy position and prepped and draped in the usual sterile fashion. A timeout was performed. A 23 French rigid cystoscope was then inserted into the urethral meatus and advanced into the bladder under direct vision. A complete bladder survey revealed no intravesical pathology.   I then injected a total of 300 units of Botox  diluted in 10 mL of saline in 0.5 mL aliquots throughout the detrusor musculature, and a grid like fashion.  There was no evidence of bleeding following the injections.  The patient's bladder was then partially drained.  She tolerated the procedure well and was transferred to the postanesthesia in stable condition.  Plan:  Follow-up in 6 months

## 2024-03-16 NOTE — H&P (Signed)
 Office Visit Report     03/02/2024   --------------------------------------------------------------------------------   Comer HERO. [name]  MRN: [id number]  DOB: Sep 05, 1955, 68 year old Female  PRIMARY CARE:  Apolinar Eastern, MD  PRIMARY CARE FAX:  [phone]  REFERRING:  Ubaldo FABIENE Eagles, NP  PROVIDER:  Lonni Han, M.D.  LOCATION:  [facility] [phone]     --------------------------------------------------------------------------------   CC/HPI: CC: Bladder infections   HPI: [name] is a 68 year old female with a history multiple sclerosis incontinence w/o sensation and recurrent urinary tract infections. She performs self-catheterization BID/TID when she feels suprapubic fullness/discomfort. s/p intravesical botox  injections (300 units) on 04/10/21.   04/13/19: Patient with above noted history. She presents today with concerns for UTI. She states that currently she is having increased issues with her MS and is going for further testing this week. She states that for the past 1-2 weeks she has been having increased incontinence between spontaneous voids and CIC. She continues to perform CIC x 2-3 times daily. She denies difficulties performing CIC, but did not some discomfort earlier this week. She denies gross hematuria or fevers. She has been using Uribel, which does help. She states that she was treated about 6 weeks ago for an episode of cystitis, but it has been approximately one year prior to this event. She remains on keflex , cranberry tablets, D-mannose, probiotics and Vit C for UTI prevention.   02/29/20: The patient his here today for a f/u after developing dysuria and a suspected UTI. She states that her urinary sxs cleared after a course of keflex . She had a urine culture performed on 7/28 that showed no growth. UA today is clear. She continues to perform CIC w/o difficulty. Of note, she is frustrated with the lack of communication between the triage  and nursing staff concerning her recent issues.   10/31/2020: Patient with above noted past medical history. She is currently not having any issues but is going on a cruise and wanted to ensure she was not having any infections. She also wanted something for urgency and frequency if needed. She currently denies any increased urgency, frequency, dysuria. Her urinalysis is not overly concerning for an infection today. She recently picked up her cephalexin  which she takes as a suppressive antibiotic. [name] She is going on a cruise to [place] and is very excited about this.   03/11/21: [name] for a routine follow-up. Diarrhea has worsened over the pat 6 months--currently on daily keflex . She was prescribed oxybutynin at her last visit, but admits that she has not started the medication out of concern for dry eye side effects. She reports that she is completely incontinent and wears 3-5 pads per day. She continues to perform CIC 3 times daily w/o any discomfort.   04/26/21: [name] for a routine follow-up after her recent botox  injections. She reports marked improvement in her incontinence. She is not requiring protective pads (was previously wearing 3-5). No issues cathing and denies malodorous urine, hematuria or dysuria.   09/16/21: [name] for a routine follow-up. The patient states that her incontinence remains very well managed by her previous Botox  injections. She reports minimal incontinence and is not requiring any protective pads. She continues to perform CIC every 4-6 hours throughout the day. She denies any interval UTIs or dysuria. She does note 1 episode of gross hematuria immediately following catheterization that quickly resolved.   03/17/22: The patient is here  today for a routine follow-up. She is doing well and reports no new health issues. She notes that over the past 3 to 4 weeks she has seen an increase in urinary urgency and urge incontinence.  She continues to catheterize every 4-6 hours without any pain. She denies interval UTIs, dysuria or gross hematuria. She continues to take Keflex  250 mg daily for UTI prophylaxis.   03/27/22: The patient presents back today with crampy lower abdominal pain and increased urinary urgency. Her urine culture from May 2023 showed no growth and her UA today is unremarkable. She denies any significant dysuria, flank pain or hematuria. She does have intermittent issues with constipation, but states that her bowel movements have been regular for her. She denies fevers or chills. No issues cathing.   09/15/22: [name] for a routine follow-up. She notes increased urgency and urge incontinence over the past 3-4 weeks. No interval UTIs since her last visit. No issues cathing. Her daughter was recently diagnosed with MS as well.   04/03/23: [name] for a routine follow-up. Her only complaint today is LLQ pain that has gradually worsened over the past 2-3 days. Hx of diverticulosis/-itis. No hx of stones. Denies fever/chills, nausea/vomiting. From a urinary standpoint, she notes a slight increase in urinary urgency/incontinence. She also notes a small amount of blood per urethra shortly after cathing.   08/21/23: [name] presents today for concerns of cystits. over the past few days she is having pain with performing CIC, increased sensation of urgency and frequency. She denies fevers.   09/29/2023: 68 year old female who presents today for follow-up. At last office visit she was treated for UTI and started on methenamine. She reports she is doing very well on this and is very pleased thus far. She is seeing Dr. Devere in the next few days for discussion of Botox . She is tolerating the methenamine very well.   10/02/2023: Above history noted. [name] for routine follow-up. UTI sxs have resolved--still taking methenamine. She notes more urge incontinence over the past 2-3  weeks and is ready for a repeat botox  injection. No new health issues.   10/20/2023: Mrs. Eblen is here today after receiving Botox  injections last Wednesday with Dr. Devere. She feels that she has a urinary tract infection. She had some blood in her urine perhaps from the procedure which resolved on Friday. However she has continued bladder cramping and urgency and lower abdominal discomfort. She denies fevers, nausea, flank pain, or further blood in her urine. The patient takes methenamine daily and this has been working very well for her. She also takes hyperbiotics and sometimes Uribel if she feels cystitis-like symptoms but does not have a UTI.   12/30/2023: Seen acutely today with concerns for possible UTI. She underwent repeat Botox  back in March. Previously maintained on methenamine. She also performs CIC to manage bladder emptying. PMH significant for MS. At last exam in late March she was empirically treated with Bactrim , also provided prescription for phenazopyridine. Urine culture at that time was ultimately negative.   Symptoms have been present now for the past 4 days. She is endorsing increased lower abdominal pain/discomfort with increased mixed incontinence, increased fecal urgency and fecal incontinence. Also with correlated increased frequency/urgency, dysuria/pain with cathing and need for increased CIC. She denies hematuria. After last exam she did respond favorably to antimicrobial treatment. She tells me she does not feel that last Botox  was nearly as effective than prior ones.  She has also had a recent change within the past 3 months of her MS treatment. She is now on a daily preparation, previously on a monthly infusion. No correlating fevers or chills, nausea/vomiting. She has been alternating Pyridium and Uribel which is mildly effective.   03/02/24: [name] for a routine follow-up. Above hx noted. Dx with a MDR E. Coli at her last visit and was treated with a  course of fosfomycin. She denies any UTI related symptoms today. She does note increased urinary urgency with intermittent episodes of urinary incontinence. She also notes intermittent episodes of fecal incontinence.     ALLERGIES: Meclizine HCl TABS - Other Reaction, hallucination Omeprazole  TBEC - Hives Tramadol - Itching, Hives    MEDICATIONS: Phenazopyridine HCl 200 MG Tablet 1 tablet PO Q 8 H PRN  Terazosin HCl  amLODIPine  Besylate 2.5 MG Tablet  Aubagio  Biotin  Cymbalta 30 MG Capsule Delayed Release Particles  Florajen Acidophilus Capsule 1 capsule PO Daily  Fosfomycin Tromethamine  1 pack PO Q72H  Gabapentin 600 MG Tablet  Hyperbiotic  Losartan -Hydrochlorothiazide  Meloxicam   NexIUM  PACK Oral  Teriflunomide 14 MG Tablet  Uribel 118 MG Capsule 1 capsule PO Q 8 H PRN Take as needed for painful urination  Vitamin B12  Vitamin C  Vitamin D3 125 MCG (5000 UT) Tablet Oral     GU PSH: Cystourethroscopy, W/Injection For Chemodenervation Of Bladder - 10/15/2022, 04/09/2022, 2023, 2022 Hysterectomy Unilat SO - 2013       PSH Notes: Bladder Surgery   NON-GU PSH: Rotator Cuff Surgery.. Shoulder Surgery (Unspecified) Visit Complexity (formerly GPC1X) - 12/30/2023, 10/20/2023, 10/02/2023, 09/29/2023, 08/21/2023, 04/03/2023, 09/15/2022     GU PMH: Areflexic bladder - 12/30/2023, - 10/20/2023, - 10/02/2023, - 09/29/2023, - 04/03/2023, - 09/15/2022, - 03/27/2022, - 03/17/2022, - 2023, - 2022, - 2022, - 2022, - 2019, - 2017, Hypotonic bladder, - 2016 Chronic cystitis (w/o hematuria) - 12/30/2023, - 10/02/2023, - 09/29/2023, - 03/17/2022, - 2023, - 2022, - 2019, - 2017, Chronic cystitis, - 2015 Acute Cystitis/UTI - 10/20/2023, - 08/21/2023, - 2019 Incontinence w/o Sensation - 10/02/2023, - 04/03/2023, - 09/15/2022, - 03/27/2022, - 2023, - 2022, - 2022, Urinary incontinence without sensory awareness, - 2016 LLQ pain - 04/03/2023 Pelvic/perineal pain (Stable) - 03/27/2022, Abdominal pain, suprapubic, - 2015 Dysuria, Dysuria -  2016 Urinary Tract Inf, Unspec site, Pyuria - 2015, Urinary tract infection, - 2014    NON-GU PMH: Multiple sclerosis - 09/15/2022, - 03/17/2022, - 2022, Multiple sclerosis, - 2016 Encounter for general adult medical examination without abnormal findings, Encounter for preventive health examination - 2015 Anxiety, Anxiety (Symptom) - 2014 Personal history of other mental and behavioral disorders, History of depression - 2014 Personal history of other specified conditions, History of heartburn - 2014 Migraine, unspecified, not intractable, without status migrainosus    FAMILY HISTORY: Death In The Family Father - Father Family Health Status - Mother's Age - Mother Family Health Status Number - Runs In Family Lung Cancer - Father   SOCIAL HISTORY: Marital Status: Married Preferred Language: English; Ethnicity: Not Hispanic Or Latino; Race: White Current Smoking Status: Patient has never smoked.  Does not use smokeless tobacco. Drinks 2 drinks per day.  Does not use drugs. Drinks 2 caffeinated drinks per day. Patient's occupation is/was Retired.    REVIEW OF SYSTEMS:    GU Review Female:   Patient denies frequent urination, hard to postpone urination, burning /pain with urination, get up at night to urinate, leakage of urine, stream  starts and stops, trouble starting your stream, have to strain to urinate, and being pregnant.  Gastrointestinal (Upper):   Patient denies nausea, vomiting, and indigestion/ heartburn.  Gastrointestinal (Lower):   Patient denies diarrhea and constipation.  Constitutional:   Patient denies fever, night sweats, weight loss, and fatigue.  Skin:   Patient denies skin rash/ lesion and itching.  Eyes:   Patient denies blurred vision and double vision.  Ears/ Nose/ Throat:   Patient denies sore throat and sinus problems.  Hematologic/Lymphatic:   Patient denies swollen glands and easy bruising.  Cardiovascular:   Patient denies leg swelling and chest pains.   Respiratory:   Patient denies cough and shortness of breath.  Endocrine:   Patient denies excessive thirst.  Musculoskeletal:   Patient denies back pain and joint pain.  Neurological:   Patient denies headaches and dizziness.  Psychologic:   Patient denies depression and anxiety.   VITAL SIGNS: None   Complexity of Data:  Records Review:   Previous Patient Records  Urine Test Review:   Urine Culture   PROCEDURES:          Visit Complexity - G2211          Urinalysis w/Scope Dipstick Dipstick Cont'd Micro  Color: Yellow Bilirubin: Neg mg/dL WBC/hpf: 10 - 79/yeq  Appearance: Slightly Cloudy Ketones: Neg mg/dL RBC/hpf: 0 - 2/hpf  Specific Gravity: 1.020 Blood: Neg ery/uL Bacteria: Few (10-25/hpf)  pH: 6.5 Protein: Trace mg/dL Cystals: NS (Not Seen)  Glucose: Neg mg/dL Urobilinogen: 0.2 mg/dL Casts: NS (Not Seen)    Nitrites: Neg Trichomonas: Not Present    Leukocyte Esterase: Neg leu/uL Mucous: Not Present      Epithelial Cells: 0 - 5/hpf      Yeast: NS (Not Seen)      Sperm: Not Present    ASSESSMENT:      ICD-10 Details  1 GU:   Chronic cystitis (w/o hematuria) - N30.20   2   Areflexic bladder - N31.2   3   Urge incontinence - N39.41      PLAN:            Medications New Meds: Methenamine Hippurate 1 GM Tablet 1 tablet PO BID   #180  3 Refill(s)  Pharmacy Name:  [facility] [id number]  Address:  [address]   [place], [place] [address]  Phone:  [phone]  Fax:  [phone]            Orders Labs Urine Culture          Schedule Return Visit/Planned Activity: Next Available Appointment - Schedule Surgery          Document Letter(s):  Created for Patient: Clinical Summary         Notes:   -Continue to perform CIC 5 times daily  -Continue methenamine BID  -Continue UTI prevention supplements  -Samples of Gemtesa provided  -Repeat botox  (300 units) in the coming weeks

## 2024-03-16 NOTE — Transfer of Care (Signed)
 Immediate Anesthesia Transfer of Care Note  Patient: [name]  Procedure(s) Performed: CYSTOSCOPY, WITH INJECTION OF BLADDER WALL (Bladder)  Patient Location: PACU  Anesthesia Type:MAC  Level of Consciousness: awake, drowsy, and patient cooperative  Airway & Oxygen Therapy: Patient Spontanous Breathing and Patient connected to face mask oxygen  Post-op Assessment: Report given to RN and Post -op Vital signs reviewed and stable  Post vital signs: Reviewed and stable  Last Vitals:  Vitals Value Taken Time  BP 104/54 03/16/24 09:50  Temp    Pulse 68 03/16/24 09:51  Resp 17 03/16/24 09:51  SpO2 97 % 03/16/24 09:51  Vitals shown include unfiled device data.  Last Pain:  Vitals:   03/16/24 0855  TempSrc:   PainSc: 0-No pain         Complications: No notable events documented.

## 2024-03-16 NOTE — Anesthesia Procedure Notes (Signed)
 Procedure Name: MAC Date/Time: 03/16/2024 9:14 AM  Performed by: Metta Andrea NOVAK, CRNAPre-anesthesia Checklist: Patient identified, Emergency Drugs available, Suction available, Patient being monitored and Timeout performed Oxygen Delivery Method: Simple face mask Placement Confirmation: positive ETCO2

## 2024-03-16 NOTE — Anesthesia Postprocedure Evaluation (Signed)
 Anesthesia Post Note  Patient: [name]  Procedure(s) Performed: CYSTOSCOPY, WITH INJECTION OF BLADDER WALL (Bladder)     Patient location during evaluation: PACU Anesthesia Type: MAC Level of consciousness: awake and alert Pain management: pain level controlled Vital Signs Assessment: post-procedure vital signs reviewed and stable Respiratory status: spontaneous breathing, nonlabored ventilation and respiratory function stable Cardiovascular status: stable and blood pressure returned to baseline Postop Assessment: no apparent nausea or vomiting Anesthetic complications: no   No notable events documented.  Last Vitals:  Vitals:   03/16/24 1000 03/16/24 1015  BP: 107/61 129/66  Pulse: 66 66  Resp: 13 20  Temp:  (!) 36.4 C  SpO2: 100% 99%    Last Pain:  Vitals:   03/16/24 1015  TempSrc:   PainSc: 0-No pain                 Casee Knepp,W. EDMOND

## 2024-03-17 ENCOUNTER — Encounter (HOSPITAL_COMMUNITY): Payer: Self-pay | Admitting: Urology

## 2024-03-17 DIAGNOSIS — M25579 Pain in unspecified ankle and joints of unspecified foot: Secondary | ICD-10-CM | POA: Diagnosis not present

## 2024-03-24 DIAGNOSIS — M25579 Pain in unspecified ankle and joints of unspecified foot: Secondary | ICD-10-CM | POA: Diagnosis not present

## 2024-03-25 DIAGNOSIS — R197 Diarrhea, unspecified: Secondary | ICD-10-CM | POA: Diagnosis not present

## 2024-03-30 DIAGNOSIS — K642 Third degree hemorrhoids: Secondary | ICD-10-CM | POA: Diagnosis not present

## 2024-03-31 DIAGNOSIS — M25579 Pain in unspecified ankle and joints of unspecified foot: Secondary | ICD-10-CM | POA: Diagnosis not present

## 2024-04-13 DIAGNOSIS — M546 Pain in thoracic spine: Secondary | ICD-10-CM | POA: Diagnosis not present

## 2024-04-14 DIAGNOSIS — M25579 Pain in unspecified ankle and joints of unspecified foot: Secondary | ICD-10-CM | POA: Diagnosis not present

## 2024-04-18 DIAGNOSIS — K642 Third degree hemorrhoids: Secondary | ICD-10-CM | POA: Diagnosis not present

## 2024-04-20 DIAGNOSIS — Z85828 Personal history of other malignant neoplasm of skin: Secondary | ICD-10-CM | POA: Diagnosis not present

## 2024-04-20 DIAGNOSIS — L570 Actinic keratosis: Secondary | ICD-10-CM | POA: Diagnosis not present

## 2024-04-20 DIAGNOSIS — D1801 Hemangioma of skin and subcutaneous tissue: Secondary | ICD-10-CM | POA: Diagnosis not present

## 2024-04-20 DIAGNOSIS — L814 Other melanin hyperpigmentation: Secondary | ICD-10-CM | POA: Diagnosis not present

## 2024-04-20 DIAGNOSIS — L820 Inflamed seborrheic keratosis: Secondary | ICD-10-CM | POA: Diagnosis not present

## 2024-04-20 DIAGNOSIS — L821 Other seborrheic keratosis: Secondary | ICD-10-CM | POA: Diagnosis not present

## 2024-04-29 DIAGNOSIS — R350 Frequency of micturition: Secondary | ICD-10-CM | POA: Diagnosis not present

## 2024-04-29 DIAGNOSIS — N3020 Other chronic cystitis without hematuria: Secondary | ICD-10-CM | POA: Diagnosis not present

## 2024-04-29 DIAGNOSIS — R3915 Urgency of urination: Secondary | ICD-10-CM | POA: Diagnosis not present

## 2024-05-03 ENCOUNTER — Other Ambulatory Visit: Payer: Self-pay

## 2024-05-03 MED ORDER — AMLODIPINE BESYLATE 2.5 MG PO TABS: 2.5000 mg | ORAL_TABLET | Freq: Every day | ORAL | 3 refills | Status: AC

## 2024-05-06 DIAGNOSIS — K641 Second degree hemorrhoids: Secondary | ICD-10-CM | POA: Diagnosis not present

## 2024-05-11 DIAGNOSIS — N319 Neuromuscular dysfunction of bladder, unspecified: Secondary | ICD-10-CM | POA: Diagnosis not present

## 2024-05-11 DIAGNOSIS — N390 Urinary tract infection, site not specified: Secondary | ICD-10-CM | POA: Diagnosis not present

## 2024-05-23 DIAGNOSIS — L570 Actinic keratosis: Secondary | ICD-10-CM | POA: Diagnosis not present

## 2024-05-24 ENCOUNTER — Ambulatory Visit: Payer: Self-pay

## 2024-05-24 NOTE — Telephone Encounter (Signed)
 FYI Only or Action Required?: FYI only for provider.  Patient was last seen in primary care on 12/10/2023 by Luke Chiquita SAUNDERS, DO.  Called Nurse Triage reporting Sinusitis and Urinary Frequency.  Symptoms began several days ago.  Interventions attempted: Rest, hydration, or home remedies.  Symptoms are: gradually worsening.  Triage Disposition: See Physician Within 24 Hours  Patient/caregiver understands and will follow disposition?: Yes    Copied from CRM #[id number]. Topic: Clinical - Red Word Triage >> May 24, 2024  1:42 PM Sasha M wrote: Red Word that prompted transfer to Nurse Triage: UTI symptoms have returned as well as now pt has sinus infection as well Reason for Disposition  [1] Sinus pain (not just congestion) AND [2] fever    No fever  Answer Assessment - Initial Assessment Questions Pt states that she has been seeing a urologist for ongoing urinary problems. She states she has been on atbx for the better part of the last 6 weeks. She finished a 10 day round of atbx for urinary incontinence and that restarted on Sunday. Also she began to have sinus problems on Sunday. She states it was very difficult to sleep last night. She states that she also has MS and when she has an infection she can tell because her MS flares and she is having difficulty walking.   1. LOCATION: Where does it hurt?      Facial pain 2. ONSET: When did the sinus pain start?  (e.g., hours, days)      Sunday evening, yesterday morning 3. SEVERITY: How bad is the pain?   (Scale 0-10; or none, mild, moderate or severe)     5-6 4. RECURRENT SYMPTOM: Have you ever had sinus problems before? If Yes, ask: When was the last time? and What happened that time?      Yes  5. NASAL CONGESTION: Is the nose blocked? If Yes, ask: Can you open it or must you breathe through your mouth?     yes 6. NASAL DISCHARGE: Do you have discharge from your nose? If so ask, What color?     yellow 7. FEVER: Do  you have a fever? If Yes, ask: What is it, how was it measured, and when did it start?      No  8. OTHER SYMPTOMS: Do you have any other symptoms? (e.g., sore throat, cough, earache, difficulty breathing)     Cough, nasal drainage  Protocols used: Sinus Pain or Congestion-A-AH

## 2024-05-25 ENCOUNTER — Ambulatory Visit: Admitting: Family Medicine

## 2024-05-25 ENCOUNTER — Encounter (HOSPITAL_COMMUNITY): Payer: Self-pay

## 2024-05-25 ENCOUNTER — Emergency Department (HOSPITAL_COMMUNITY)

## 2024-05-25 ENCOUNTER — Emergency Department (HOSPITAL_COMMUNITY)
Admission: EM | Admit: 2024-05-25 | Discharge: 2024-05-27 | Disposition: A | Discharge status: Home / Self Care | Attending: Emergency Medicine | Admitting: Emergency Medicine

## 2024-05-25 DIAGNOSIS — R531 Weakness: Secondary | ICD-10-CM | POA: Insufficient documentation

## 2024-05-25 DIAGNOSIS — Z79899 Other long term (current) drug therapy: Secondary | ICD-10-CM | POA: Insufficient documentation

## 2024-05-25 DIAGNOSIS — W19XXXA Unspecified fall, initial encounter: Secondary | ICD-10-CM | POA: Insufficient documentation

## 2024-05-25 DIAGNOSIS — M542 Cervicalgia: Secondary | ICD-10-CM | POA: Insufficient documentation

## 2024-05-25 DIAGNOSIS — R0981 Nasal congestion: Secondary | ICD-10-CM | POA: Insufficient documentation

## 2024-05-25 DIAGNOSIS — M25512 Pain in left shoulder: Secondary | ICD-10-CM | POA: Insufficient documentation

## 2024-05-25 DIAGNOSIS — J3489 Other specified disorders of nose and nasal sinuses: Secondary | ICD-10-CM | POA: Insufficient documentation

## 2024-05-25 DIAGNOSIS — G35D Multiple sclerosis, unspecified: Secondary | ICD-10-CM

## 2024-05-25 DIAGNOSIS — I10 Essential (primary) hypertension: Secondary | ICD-10-CM | POA: Insufficient documentation

## 2024-05-25 DIAGNOSIS — D72829 Elevated white blood cell count, unspecified: Secondary | ICD-10-CM | POA: Insufficient documentation

## 2024-05-25 LAB — URINALYSIS, W/ REFLEX TO CULTURE (INFECTION SUSPECTED)
Bacteria, UA: NONE SEEN
Bilirubin Urine: NEGATIVE
Glucose, UA: NEGATIVE mg/dL
Hgb urine dipstick: NEGATIVE
Ketones, ur: 5 mg/dL — AB
Leukocytes,Ua: NEGATIVE
Nitrite: NEGATIVE
Protein, ur: NEGATIVE mg/dL
Specific Gravity, Urine: 1.008 (ref 1.005–1.030)
pH: 8 (ref 5.0–8.0)

## 2024-05-25 LAB — CBC WITH DIFFERENTIAL/PLATELET
Abs Immature Granulocytes: 0.04 K/uL (ref 0.00–0.07)
Basophils Absolute: 0.1 K/uL (ref 0.0–0.1)
Basophils Relative: 1 %
Eosinophils Absolute: 0.1 K/uL (ref 0.0–0.5)
Eosinophils Relative: 1 %
HCT: 42.4 % (ref 36.0–46.0)
Hemoglobin: 14.1 g/dL (ref 12.0–15.0)
Immature Granulocytes: 0 %
Lymphocytes Relative: 8 %
Lymphs Abs: 1 K/uL (ref 0.7–4.0)
MCH: 32 pg (ref 26.0–34.0)
MCHC: 33.3 g/dL (ref 30.0–36.0)
MCV: 96.4 fL (ref 80.0–100.0)
Monocytes Absolute: 1 K/uL (ref 0.1–1.0)
Monocytes Relative: 8 %
Neutro Abs: 10.1 K/uL — ABNORMAL HIGH (ref 1.7–7.7)
Neutrophils Relative %: 82 %
Platelets: 245 K/uL (ref 150–400)
RBC: 4.4 MIL/uL (ref 3.87–5.11)
RDW: 12.8 % (ref 11.5–15.5)
WBC: 12.4 K/uL — ABNORMAL HIGH (ref 4.0–10.5)
nRBC: 0 % (ref 0.0–0.2)

## 2024-05-25 LAB — COMPREHENSIVE METABOLIC PANEL WITH GFR
ALT: 104 U/L — ABNORMAL HIGH (ref 0–44)
AST: 73 U/L — ABNORMAL HIGH (ref 15–41)
Albumin: 4.6 g/dL (ref 3.5–5.0)
Alkaline Phosphatase: 182 U/L — ABNORMAL HIGH (ref 38–126)
Anion gap: 14 (ref 5–15)
BUN: 10 mg/dL (ref 8–23)
CO2: 25 mmol/L (ref 22–32)
Calcium: 9.7 mg/dL (ref 8.9–10.3)
Chloride: 100 mmol/L (ref 98–111)
Creatinine, Ser: 0.64 mg/dL (ref 0.44–1.00)
GFR, Estimated: >60 mL/min (ref >60)
Glucose, Bld: 109 mg/dL — ABNORMAL HIGH (ref 70–99)
Potassium: 3.5 mmol/L (ref 3.5–5.1)
Sodium: 139 mmol/L (ref 135–145)
Total Bilirubin: 1.2 mg/dL (ref 0.0–1.2)
Total Protein: 7.5 g/dL (ref 6.5–8.1)

## 2024-05-25 LAB — TROPONIN T, HIGH SENSITIVITY
Troponin T High Sensitivity: 25 ng/L — ABNORMAL HIGH (ref 0–19)
Troponin T High Sensitivity: 22 ng/L — ABNORMAL HIGH (ref 0–19)

## 2024-05-25 LAB — I-STAT CG4 LACTIC ACID, ED: Lactic Acid, Venous: 1.3 mmol/L (ref 0.5–1.9)

## 2024-05-25 LAB — CK: Total CK: 125 U/L (ref 38–234)

## 2024-05-25 LAB — MAGNESIUM: Magnesium: 2 mg/dL (ref 1.7–2.4)

## 2024-05-25 MED ORDER — AMLODIPINE BESYLATE 5 MG PO TABS
2.5000 mg | ORAL_TABLET | Freq: Every day | ORAL | Status: DC
  Administered 2024-05-25 – 2024-05-27 (×3): 2.5 mg via ORAL
  Filled 2024-05-25 (×3): qty 1

## 2024-05-25 MED ORDER — BIOTIN 10000 MCG PO TABS: 1.0000 | ORAL_TABLET | Freq: Every day | ORAL | Status: DC

## 2024-05-25 MED ORDER — LACTATED RINGERS IV BOLUS
1000.0000 mL | Freq: Once | INTRAVENOUS | Status: AC
  Administered 2024-05-25: 1000 mL via INTRAVENOUS

## 2024-05-25 MED ORDER — CYCLOBENZAPRINE HCL 10 MG PO TABS
5.0000 mg | ORAL_TABLET | Freq: Once | ORAL | Status: AC
  Administered 2024-05-25: 5 mg via ORAL
  Filled 2024-05-25: qty 1

## 2024-05-25 MED ORDER — ONDANSETRON HCL 4 MG/2ML IJ SOLN
4.0000 mg | Freq: Once | INTRAMUSCULAR | Status: AC
  Administered 2024-05-25: 4 mg via INTRAVENOUS
  Filled 2024-05-25: qty 2

## 2024-05-25 MED ORDER — CALCIUM CITRATE-VITAMIN D3 315-6.25 MG-MCG PO TABS: ORAL_TABLET | Freq: Every day | ORAL | Status: DC

## 2024-05-25 MED ORDER — FLUTICASONE PROPIONATE 50 MCG/ACT NA SUSP
1.0000 | Freq: Every day | NASAL | Status: DC | PRN
  Filled 2024-05-25: qty 16

## 2024-05-25 MED ORDER — DULOXETINE HCL 30 MG PO CPEP
60.0000 mg | ORAL_CAPSULE | Freq: Two times a day (BID) | ORAL | Status: DC
  Administered 2024-05-25 – 2024-05-27 (×5): 60 mg via ORAL
  Filled 2024-05-25 (×5): qty 2

## 2024-05-25 MED ORDER — CALCIUM CITRATE 950 (200 CA) MG PO TABS
200.0000 mg | ORAL_TABLET | Freq: Every day | ORAL | Status: DC
  Administered 2024-05-26 – 2024-05-27 (×2): 950 mg via ORAL
  Filled 2024-05-25 (×2): qty 1

## 2024-05-25 MED ORDER — VITAMIN D3 50 MCG (2000 UT) PO CAPS: 2000.0000 [IU] | ORAL_CAPSULE | Freq: Every day | ORAL | Status: DC

## 2024-05-25 MED ORDER — CHOLECALCIFEROL 10 MCG (400 UNIT) PO TABS
400.0000 [IU] | ORAL_TABLET | Freq: Every day | ORAL | Status: DC
Start: 2024-05-26 — End: 2024-05-27
  Administered 2024-05-26 – 2024-05-27 (×2): 400 [IU] via ORAL
  Filled 2024-05-25 (×2): qty 1

## 2024-05-25 MED ORDER — VITAMIN D 25 MCG (1000 UNIT) PO TABS
2000.0000 [IU] | ORAL_TABLET | Freq: Every day | ORAL | Status: DC
  Administered 2024-05-26 – 2024-05-27 (×2): 2000 [IU] via ORAL
  Filled 2024-05-25 (×2): qty 2

## 2024-05-25 MED ORDER — LIDOCAINE 5 % EX PTCH
1.0000 | MEDICATED_PATCH | CUTANEOUS | Status: DC
  Administered 2024-05-25 – 2024-05-27 (×3): 1 via TRANSDERMAL
  Filled 2024-05-25 (×3): qty 1

## 2024-05-25 MED ORDER — GABAPENTIN 300 MG PO CAPS
600.0000 mg | ORAL_CAPSULE | Freq: Every day | ORAL | Status: DC
  Administered 2024-05-25 – 2024-05-26 (×2): 600 mg via ORAL
  Filled 2024-05-25 (×2): qty 2

## 2024-05-25 MED ORDER — IBUPROFEN 200 MG PO TABS
600.0000 mg | ORAL_TABLET | Freq: Every day | ORAL | Status: DC | PRN
  Administered 2024-05-25 – 2024-05-26 (×3): 600 mg via ORAL
  Filled 2024-05-25 (×3): qty 3

## 2024-05-25 MED ORDER — PANTOPRAZOLE SODIUM 40 MG PO TBEC
40.0000 mg | DELAYED_RELEASE_TABLET | Freq: Every day | ORAL | Status: DC
  Administered 2024-05-25 – 2024-05-27 (×3): 40 mg via ORAL
  Filled 2024-05-25 (×3): qty 1

## 2024-05-25 MED ORDER — D-MANNOSE 500 MG PO CAPS: 1.0000 | ORAL_CAPSULE | Freq: Two times a day (BID) | ORAL | Status: DC

## 2024-05-25 MED ORDER — CRANBERRY 500 MG PO CAPS: 500.0000 mg | ORAL_CAPSULE | Freq: Every day | ORAL | Status: DC

## 2024-05-25 MED ORDER — VITAMIN C 500 MG PO TABS
500.0000 mg | ORAL_TABLET | Freq: Every day | ORAL | Status: DC
  Administered 2024-05-25 – 2024-05-27 (×3): 500 mg via ORAL
  Filled 2024-05-25 (×3): qty 1

## 2024-05-25 NOTE — ED Triage Notes (Signed)
 Patient arrived via [facility] after a mechanical fall. Patient states that she was walking, felt weak and let herself down to the ground. Patient denies LOC, blood thinners, hitting her head, and only complains of chronic neck/back pain.

## 2024-05-25 NOTE — Evaluation (Signed)
 Physical Therapy Evaluation Patient Details Name: [name] MRN: [id number] DOB: 1956-07-10 Today's Date: 05/25/2024  History of Present Illness  68 yo female presents to PT from ED following a assisted mechanical fall in home setting. Imaging thus far indicates no acute findings for L shoulder. Pt reports completing ABX for UTI on 10/26 and stating that she is having some sinus pressure and congestion. Pt PMH includes but is not limited to: arthritis, diverticulosis, GERD, HTN, MS relapsing-remitting, neurogenic bladder- self catheterizes, Sjogrens syndrome, L shoulder surgery, falls, recurrent UTIs, HLD chronic L shoulder, back and neck pain, and torn ligament in L knee.  Clinical Impression    Pt admitted with above diagnosis.  Pt currently with functional limitations due to the deficits listed below (see PT Problem List). Pt on stretcher in ED when PT arrived. Pt had visitors present. Pt agreeable to therapy intervention. Pt reported some L UE/shoulder pain. Pt expressed concern per possible lingering UTI and stated congestion as above. Pt states she has had similar episodes in past when she has had a fever, in regards to the weakness and falls. Pt sates new onset of feeling hot, light headed and nauseated. Pt exhibited signs and symptoms of orthostatic hypotension including diaphoresis at end of PT eval compounded by fatigue and underlying MS. Pt required min A for supine to sit, min A for sit to stand from EOB, initial standing balance with min A and posterior lean, gait tasks with CGA to min A with cues and RW 40 feet. Following gait tasks pt reported felling fatigued, hot and nauseated. Pt required increased assist mod for sit to stand and side stepping to L to [facility] and mod/max A for supine to sit. Once returned to supine on stretcher pt reported feeling better but noted to be clammy. Pt left in bed, visitors present and all needs in place. Patient will benefit from continued inpatient  follow up therapy, <3 hours/day.  Pt will benefit from acute skilled PT to increase their independence and safety with mobility to allow discharge.   Bp semi reclined at rest 146/72 (86 PR) Seated EOB 126/77 (89 PR) Immediate standing 119/70 (85 PR) S/p return to bed and reportedly symptomatic 124/70 (90 PR)      If plan is discharge home, recommend the following: A little help with walking and/or transfers;A little help with bathing/dressing/bathroom;Assistance with cooking/housework;Assist for transportation;Help with stairs or ramp for entrance   Can travel by private vehicle   Yes    Equipment Recommendations None recommended by PT  Recommendations for Other Services       Functional Status Assessment Patient has had a recent decline in their functional status and demonstrates the ability to make significant improvements in function in a reasonable and predictable amount of time.     Precautions / Restrictions Precautions Precautions: Fall (orthostatic hypotension) Restrictions Weight Bearing Restrictions Per Provider Order: No      Mobility  Bed Mobility Overal bed mobility: Needs Assistance Bed Mobility: Supine to Sit, Sit to Supine     Supine to sit: Min assist, HOB elevated, Used rails Sit to supine: Max assist   General bed mobility comments: min a increased time and cues for supine to sit with HOB slightly elevated and to return to bed max A due to reports of fatigue, feeling hot and nauseated    Transfers Overall transfer level: Needs assistance Equipment used: Rolling walker (2 wheels) Transfers: Sit to/from Stand Sit to Stand: Min assist, Mod assist  General transfer comment: pt required min A for sit to stand from stretcher the first time with posterior lean noted and cues for anterior weight shift and WB through toes pt able to modify posture with multimodal cues and maintain static standing with CGA and B UE support, second sit to stand from  stretcher s/p gait pt required mod A due to fatigue    Ambulation/Gait Ambulation/Gait assistance: Contact guard assist, Min assist Gait Distance (Feet): 40 Feet Assistive device: Rolling walker (2 wheels) Gait Pattern/deviations: Step-to pattern, Trunk flexed, Decreased dorsiflexion - right, Decreased dorsiflexion - left Gait velocity: decreased     General Gait Details: min cues, for posture, safety, direction, foot clearance  as well as m maintaining proper body position inside RW and min A for balance with pt able to progress to CGA  Stairs            Wheelchair Mobility     Tilt Bed    Modified Rankin (Stroke Patients Only)       Balance Overall balance assessment: History of Falls, Needs assistance Sitting-balance support: Feet supported Sitting balance-Leahy Scale: Fair     Standing balance support: Bilateral upper extremity supported, During functional activity, Reliant on assistive device for balance Standing balance-Leahy Scale: Poor                               Pertinent Vitals/Pain Pain Assessment Pain Assessment: Faces Faces Pain Scale: Hurts even more Pain Location: chronic pain- L shoulder primary report Pain Descriptors / Indicators: Aching, Constant, Discomfort, Grimacing Pain Intervention(s): Limited activity within patient's tolerance, Monitored during session, Premedicated before session, Repositioned    Home Living Family/patient expects to be discharged to:: Private residence Living Arrangements: Spouse/significant other Available Help at Discharge: Family Type of Home: House Home Access: Stairs to enter Entrance Stairs-Rails: Doctor, General Practice of Steps: 4   Home Layout: One level Home Equipment: Agricultural Consultant (2 wheels);Rollator (4 wheels);Cane - single point;Transport chair      Prior Function Prior Level of Function : Needs assist       Physical Assist : Mobility (physical);ADLs  (physical) Mobility (physical): Bed mobility;Transfers;Gait;Stairs ADLs (physical): Bathing;IADLs Mobility Comments: pt indicates she has been using rollator/conversion transport chair for mobility in home, however does not fit through doorway to bathrom and pt then is required to use rollator, pt has hx of falls, spouse assist with step navigation, occationally with gait tasks and B LE in/out of bed ADLs Comments: pt states she requires A for lower body dressing tasks and for bathing, as well as IADLs, pt is able to self cath     Extremity/Trunk Assessment        Lower Extremity Assessment Lower Extremity Assessment: Generalized weakness    Cervical / Trunk Assessment Cervical / Trunk Assessment: Normal  Communication   Communication Communication: No apparent difficulties    Cognition Arousal: Alert Behavior During Therapy: WFL for tasks assessed/performed   PT - Cognitive impairments: No apparent impairments                         Following commands: Intact       Cueing       General Comments      Exercises     Assessment/Plan    PT Assessment Patient needs continued PT services  PT Problem List Decreased strength;Decreased range of motion;Decreased activity tolerance;Decreased balance;Decreased mobility;Decreased coordination;Pain;Cardiopulmonary status limiting  activity       PT Treatment Interventions DME instruction;Gait training;Stair training;Functional mobility training;Therapeutic activities;Therapeutic exercise;Balance training;Neuromuscular re-education;Patient/family education    PT Goals (Current goals can be found in the Care Plan section)  Acute Rehab PT Goals Patient Stated Goal: to feel strong enough and well to navigate in/out of bathroom mod I PT Goal Formulation: With patient Time For Goal Achievement: 06/08/24 Potential to Achieve Goals: Fair    Frequency Min 3X/week     Co-evaluation               AM-PAC PT 6  Clicks Mobility  Outcome Measure Help needed turning from your back to your side while in a flat bed without using bedrails?: A Little Help needed moving from lying on your back to sitting on the side of a flat bed without using bedrails?: A Little Help needed moving to and from a bed to a chair (including a wheelchair)?: A Little Help needed standing up from a chair using your arms (e.g., wheelchair or bedside chair)?: A Little Help needed to walk in hospital room?: A Little Help needed climbing 3-5 steps with a railing? : Total 6 Click Score: 16    End of Session Equipment Utilized During Treatment: Gait belt Activity Tolerance: Patient limited by fatigue Patient left: in bed;with call bell/phone within reach;with family/visitor present Nurse Communication: Mobility status PT Visit Diagnosis: Unsteadiness on feet (R26.81);Other abnormalities of gait and mobility (R26.89);Muscle weakness (generalized) (M62.81);History of falling (Z91.81);Difficulty in walking, not elsewhere classified (R26.2);Pain Pain - Right/Left: Left Pain - part of body: Shoulder;Arm    Time: 8444-8370 PT Time Calculation (min) (ACUTE ONLY): 34 min   Charges:   PT Evaluation $PT Eval Low Complexity: 1 Low PT Treatments $Gait Training: 8-22 mins PT General Charges $$ ACUTE PT VISIT: 1 Visit         [place], PT Acute Rehab   [place] VEAR Drone 05/25/2024, 4:55 PM

## 2024-05-25 NOTE — NC FL2 (Signed)
 [place]  MEDICAID FL2 LEVEL OF CARE FORM     IDENTIFICATION  Patient Name: [name] Birthdate: July 30, 1955 Sex: female Admission Date (Current Location): 05/25/2024  [facility] and [place] Number:  Producer, Television/film/video and Address:  [facility],  [address] [place], [place] [address]      Provider Number: [id number]  Attending Physician Name and Address:  Bari Roxie HERO, DO  Relative Name and Phone Number:  Hanisch,George A (Spouse)  [phone] (Mobile)    Current Level of Care: Hospital Recommended Level of Care: Skilled Nursing Facility Prior Approval Number:    Date Approved/Denied:   PASRR Number: [id number]  Discharge Plan: SNF    Current Diagnoses: Patient Active Problem List   Diagnosis Date Noted   Positive ANA (antinuclear antibody) 11/28/2021   Discoloration of skin of toe 11/28/2021   Rectal bleeding 12/05/2016   Internal hemorrhoids 12/05/2016   Hx of falling 03/17/2016   Nausea alone 02/13/2014   Other malaise and fatigue 02/13/2014   Postmenopausal estrogen deficiency 02/24/2013   Visit for preventive health examination 02/24/2013   Family hx osteoporosis 02/21/2013   Recurrent UTI 01/09/2012   Suprapubic tenderness 12/01/2011   GERD (gastroesophageal reflux disease) 10/15/2011   Dysphagia, pharyngoesophageal 10/15/2011   Voiding dysfunction 09/24/2011   Hypertriglyceridemia 02/09/2011   Vitamin D  deficiency 12/13/2008   Constipation 12/13/2008   MUSCLE SPASM 12/13/2008   DIZZINESS 12/13/2008   HYPOKALEMIA, HX OF 12/13/2008   DISORDER, ADJUSTMENT W/DEPRESSED MOOD 04/30/2007   SCLEROSIS, MULTIPLE 04/30/2007   Essential hypertension 04/30/2007   VARICOSE VEIN, LWR EXTREMITIES W/INFLAMMATION 04/30/2007   Allergic rhinitis 04/30/2007    Orientation RESPIRATION BLADDER Height & Weight     Self, Situation, Place  Normal Continent Weight:   Height:     BEHAVIORAL SYMPTOMS/MOOD NEUROLOGICAL BOWEL NUTRITION STATUS       Continent Diet (regular)  AMBULATORY STATUS COMMUNICATION OF NEEDS Skin   Limited Assist Verbally Normal                       Personal Care Assistance Level of Assistance  Bathing, Feeding, Dressing Bathing Assistance: Limited assistance Feeding assistance: Independent Dressing Assistance: Limited assistance     Functional Limitations Info  Sight, Hearing, Speech Sight Info: Adequate Hearing Info: Adequate Speech Info: Adequate    SPECIAL CARE FACTORS FREQUENCY  PT (By licensed PT), OT (By licensed OT)     PT Frequency: x5/week OT Frequency: x5/week            Contractures      Additional Factors Info  Code Status, Allergies Code Status Info: Full Allergies Info: Meclizine Hcl  Tramadol  Prilosec (Omeprazole  Magnesium )           Current Medications (05/25/2024):  This is the current hospital active medication list Current Facility-Administered Medications  Medication Dose Route Frequency Provider Last Rate Last Admin   amLODipine  (NORVASC ) tablet 2.5 mg  2.5 mg Oral Daily Rogelia Jerilynn RAMAN, MD   2.5 mg at 05/25/24 1517   ascorbic acid (VITAMIN C) tablet 500 mg  500 mg Oral Daily Rogelia Jerilynn RAMAN, MD   500 mg at 05/25/24 1517   [START ON 05/26/2024] calcium citrate (CALCITRATE - dosed in mg elemental calcium) tablet 950 mg  200 mg of elemental calcium Oral Daily Horton, Kristie M, DO       And   [START ON 05/26/2024] cholecalciferol (VITAMIN D3) 10 MCG (400 UNIT) tablet 400 Units  400 Units Oral Daily Horton,  Roxie HERO, DO       [START ON 05/26/2024] cholecalciferol (VITAMIN D3) 25 MCG (1000 UNIT) tablet 2,000 Units  2,000 Units Oral Daily Horton, Kristie M, DO       DULoxetine (CYMBALTA) DR capsule 60 mg  60 mg Oral BID Stanek, Lawrence S, MD   60 mg at 05/25/24 1516   fluticasone (FLONASE) 50 MCG/ACT nasal spray 1 spray  1 spray Each Nare Daily PRN Rogelia Jerilynn RAMAN, MD       gabapentin (NEURONTIN) capsule 600 mg  600 mg Oral QHS Stanek, Lawrence S, MD        ibuprofen  (ADVIL ) tablet 600 mg  600 mg Oral Daily PRN Rogelia Jerilynn RAMAN, MD   600 mg at 05/25/24 1525   lidocaine  (LIDODERM ) 5 % 1 patch  1 patch Transdermal Q24H Stanek, Lawrence S, MD   1 patch at 05/25/24 0957   pantoprazole  (PROTONIX ) EC tablet 40 mg  40 mg Oral Daily Stanek, Lawrence S, MD   40 mg at 05/25/24 1517   Current Outpatient Medications  Medication Sig Dispense Refill   amLODipine  (NORVASC ) 2.5 MG tablet Take 1 tablet (2.5 mg total) by mouth daily. 90 tablet 3   ascorbic acid (VITAMIN C) 500 MG tablet Take 500 mg by mouth daily.     Biotin 89999 MCG TABS Take 1 tablet by mouth daily at 6 (six) AM.     Calcium Citrate-Vitamin D  (CALCIUM CITRATE+D3 PO) Take 600 mg by mouth daily.     Cholecalciferol (VITAMIN D3) 50 MCG (2000 UT) capsule Take 2,000 Units by mouth daily.     CRANBERRY PO Take 500 mg by mouth in the morning and at bedtime.     D-MANNOSE PO Take 1 capsule by mouth in the morning and at bedtime.     DULoxetine (CYMBALTA) 60 MG capsule Take 60 mg by mouth 2 (two) times daily.     esomeprazole  (NEXIUM ) 20 MG capsule Take 20 mg by mouth daily.     fluorometholone (FML) 0.1 % ophthalmic suspension SMARTSIG:In Eye(s) (Patient not taking: Reported on 03/14/2024)     fluticasone (FLONASE) 50 MCG/ACT nasal spray Place 1 spray into both nostrils daily as needed for allergies.     gabapentin (NEURONTIN) 300 MG capsule Take 600 mg by mouth at bedtime.     hydrocortisone  (ANUSOL -HC) 2.5 % rectal cream Apply topically. (Patient not taking: Reported on 03/14/2024)     ibuprofen  (ADVIL ) 200 MG tablet Take 600 mg by mouth daily as needed for moderate pain (pain score 4-6) or mild pain (pain score 1-3).     losartan  (COZAAR ) 25 MG tablet TAKE 1 TABLET BY MOUTH DAILY FOR HIGH BLOOD PRESSURE INSTEAD OF VALSARTAN  (Patient taking differently: Take 25 mg by mouth daily.) 90 tablet 1   meloxicam  (MOBIC ) 15 MG tablet Take 15 mg by mouth daily.     Meth-Hyo-M Bl-Na Phos-Ph Sal (URIBEL) 118  MG CAPS Take 1 capsule by mouth daily as needed (bladder spasms). Using PRN     Probiotic Product (PROBIOTIC PEARLS) CAPS Take 1 capsule by mouth in the morning and at bedtime.     Teriflunomide (AUBAGIO) 14 MG TABS Take 14 mg by mouth daily.     tiZANidine (ZANAFLEX) 4 MG tablet Take 1-1.5 tabs po TID prn. (Patient not taking: Reported on 03/14/2024)     White Petrolatum -Mineral Oil (SYSTANE NIGHTTIME) OINT Place 1 Application into both eyes in the morning and at bedtime.     zoledronic  acid (RECLAST ) 5  MG/100ML SOLN injection Inject 5 mg into the vein See admin instructions. 1 time a year - every June       Discharge Medications: Please see discharge summary for a list of discharge medications.  Relevant Imaging Results:  Relevant Lab Results:   Additional Information SSN:[id number]  Kari JONETTA Daisy, LCSW

## 2024-05-25 NOTE — ED Provider Notes (Signed)
 [place] EMERGENCY DEPARTMENT AT [facility] Provider Note   CSN: [id number] Arrival date & time: 05/25/24  9268     History Chief Complaint  Patient presents with   Fall   Weakness    HPI: [name] is a 68 y.o. female with history pertinent for multiple sclerosis, osteoporosis, recurrent UTI, GERD, hypertriglyceridemia, constipation, hypertension, Sjogren's disease, neurogenic bladder who presents complaining of weakness. Patient arrived via EMS from home.  History provided by patient.  No interpreter required during this encounter.  Patient reports that she has a past medical history of multiple sclerosis, reports that she has weakness that is worsened from baseline when she has an infection.  Reports that she has a UTI because she gets chronic UTIs, also reports that she has had sinus pressure and congestion, and believes that she is experiencing sinusitis.  Reports that she was ambulating with the help of her husband this morning and was on her way to the restroom when she felt that she had generalized weakness.  Lowered herself to the ground with the help of her husband, did not have a fall, did not hit any part of her body, has not coagulants.  Denies any new or different pain, reports that she has chronic left shoulder pain as well as chronic neck pain that is at baseline.  Denies fever, chills, chest pain, shortness of breath, nausea, vomiting, diarrhea.  Patient's recorded medical, surgical, social, medication list and allergies were reviewed in the Snapshot window as part of the initial history.   Prior to Admission medications   Medication Sig Start Date End Date Taking? Authorizing Provider  amLODipine  (NORVASC ) 2.5 MG tablet Take 1 tablet (2.5 mg total) by mouth daily. 05/03/24   [name], Wanda K, MD  ascorbic acid (VITAMIN C) 500 MG tablet Take 500 mg by mouth daily.    [provider]  Biotin 89999 MCG TABS Take 1 tablet by mouth daily at 6  (six) AM.    [provider]  Calcium Citrate-Vitamin D  (CALCIUM CITRATE+D3 PO) Take 600 mg by mouth daily.    [provider]  Cholecalciferol (VITAMIN D3) 50 MCG (2000 UT) capsule Take 2,000 Units by mouth daily.    [provider]  CRANBERRY PO Take 500 mg by mouth in the morning and at bedtime.    [provider]  D-MANNOSE PO Take 1 capsule by mouth in the morning and at bedtime.    [provider]  DULoxetine (CYMBALTA) 60 MG capsule Take 60 mg by mouth 2 (two) times daily. 08/18/19   [provider]  esomeprazole  (NEXIUM ) 20 MG capsule Take 20 mg by mouth daily.    [provider]  fluorometholone (FML) 0.1 % ophthalmic suspension SMARTSIG:In Eye(s) Patient not taking: Reported on 03/14/2024 02/22/24   [provider]  fluticasone (FLONASE) 50 MCG/ACT nasal spray Place 1 spray into both nostrils daily as needed for allergies.    [provider]  gabapentin (NEURONTIN) 300 MG capsule Take 600 mg by mouth at bedtime. 04/15/19   [provider]  hydrocortisone  (ANUSOL -HC) 2.5 % rectal cream Apply topically. Patient not taking: Reported on 03/14/2024 10/29/23   [provider]  ibuprofen  (ADVIL ) 200 MG tablet Take 600 mg by mouth daily as needed for moderate pain (pain score 4-6) or mild pain (pain score 1-3).    [provider]  losartan  (COZAAR ) 25 MG tablet TAKE 1 TABLET BY MOUTH DAILY FOR HIGH BLOOD PRESSURE INSTEAD OF VALSARTAN   Patient taking differently: Take 25 mg by mouth daily. 12/09/23   [name], Apolinar POUR, MD  meloxicam  (MOBIC ) 15 MG tablet Take 15 mg by mouth daily. 03/08/24 03/08/25  [provider]  Meth-Hyo-M Starlene Phos-Ph Sal (URIBEL) 118 MG CAPS Take 1 capsule by mouth daily as needed (bladder spasms). Using PRN    [provider]  Probiotic Product (PROBIOTIC PEARLS) CAPS Take 1 capsule by mouth in the morning and at bedtime.    [provider]   Teriflunomide (AUBAGIO) 14 MG TABS Take 14 mg by mouth daily.    [provider]  tiZANidine (ZANAFLEX) 4 MG tablet Take 1-1.5 tabs po TID prn. Patient not taking: Reported on 03/14/2024 12/08/23   [provider]  White Petrolatum -Mineral Oil (SYSTANE NIGHTTIME) OINT Place 1 Application into both eyes in the morning and at bedtime.    [provider]  zoledronic  acid (RECLAST ) 5 MG/100ML SOLN injection Inject 5 mg into the vein See admin instructions. 1 time a year - every June    [provider]     Allergies: Meclizine hcl, Tramadol, and Prilosec [omeprazole  magnesium ]   Review of Systems   ROS as per HPI  Physical Exam Updated Vital Signs BP 138/73   Pulse 85   Temp 98.1 F (36.7 C) (Oral)   Resp 14   SpO2 95%  Physical Exam Vitals and nursing note reviewed.  Constitutional:      General: She is not in acute distress.    Appearance: She is well-developed.  HENT:     Head: Normocephalic and atraumatic.  Eyes:     Conjunctiva/sclera: Conjunctivae normal.  Cardiovascular:     Rate and Rhythm: Normal rate and regular rhythm.     Heart sounds: No murmur heard. Pulmonary:     Effort: Pulmonary effort is normal. No respiratory distress.     Breath sounds: Normal breath sounds.  Abdominal:     Palpations: Abdomen is soft.     Tenderness: There is no abdominal tenderness.  Musculoskeletal:        General: Tenderness (Tenderness to palpation of the left trapezius) present. No swelling.     Cervical back: Neck supple.  Skin:    General: Skin is warm and dry.     Capillary Refill: Capillary refill takes less than 2 seconds.  Neurological:     Mental Status: She is alert.  Psychiatric:        Mood and Affect: Mood normal.     ED Course/ Medical Decision Making/ A&P    Procedures Procedures   Medications Ordered in ED Medications  lidocaine  (LIDODERM ) 5 % 1 patch (1 patch Transdermal Patch Applied 05/25/24 0957)  amLODipine   (NORVASC ) tablet 2.5 mg (2.5 mg Oral Given 05/25/24 1517)  ascorbic acid (VITAMIN C) tablet 500 mg (500 mg Oral Given 05/25/24 1517)  ibuprofen  (ADVIL ) tablet 600 mg (600 mg Oral Given 05/25/24 1525)  gabapentin (NEURONTIN) capsule 600 mg (has no administration in time range)  fluticasone (FLONASE) 50 MCG/ACT nasal spray 1 spray (has no administration in time range)  pantoprazole  (PROTONIX ) EC tablet 40 mg (40 mg Oral Given 05/25/24 1517)  DULoxetine (CYMBALTA) DR capsule 60 mg (60 mg Oral Given 05/25/24 1516)  cholecalciferol (VITAMIN D3) 25 MCG (1000 UNIT) tablet 2,000 Units (has no administration in time range)  calcium citrate (CALCITRATE - dosed in mg elemental calcium) tablet 950 mg (has no administration in time range)    And  cholecalciferol (VITAMIN D3) 10 MCG (400 UNIT)  tablet 400 Units (has no administration in time range)  cyclobenzaprine (FLEXERIL) tablet 5 mg (5 mg Oral Given 05/25/24 0959)  ondansetron  (ZOFRAN ) injection 4 mg (4 mg Intravenous Given 05/25/24 0959)  lactated ringers  bolus 1,000 mL (0 mLs Intravenous Stopped 05/25/24 1103)    Medical Decision Making:   Addysen Louth Fillingim is a 68 y.o. female who presents for generalized weakness as per above.  Physical exam is pertinent for tenderness to palpation of left trapezius, otherwise no focal abnormalities.   The differential includes but is not limited to electrolyte derangement, AKI, UTI, ACS, fracture, dislocation, sprain, strain.  Independent historian: None  External data reviewed: No pertinent external data  Labs: Ordered, Independent interpretation, and Details: Lactic acid WNL.  CK WNL.  UA without UTI.  CBC with mild nonspecific leukocytosis, likely stress to margination, no anemia or thrombocytopenia. CMP with mild nonspecific LFT elevation, no AKI or electrolyte derangement.  Initial troponin 25, delta 22.  Magnesium  WNL.  Radiology: Ordered, Independent interpretation, Details: Patient reviewed x-rays  of the left shoulder, do not appreciate displaced fracture or dislocation, and All images reviewed independently.  Agree with radiology report at this time.   DG Shoulder Left Result Date: 05/25/2024 EXAM: 1 VIEW XRAY OF THE LEFT SHOULDER 05/25/2024 10:18:00 AM COMPARISON: None available. CLINICAL HISTORY: Pain. Patient arrived via [facility] after a mechanical fall. Patient states that she was walking, felt weak and let herself down to the ground. Patient denies LOC, blood thinners, hitting her head, and only complains of chronic neck/back pain. FINDINGS: BONES AND JOINTS: Glenohumeral joint is normally aligned. No acute fracture or dislocation. The [facility] joint is unremarkable in appearance. SOFT TISSUES: No abnormal calcifications. Visualized lung is unremarkable. IMPRESSION: 1. No significant abnormality. Electronically signed by: Evalene Coho MD 05/25/2024 11:21 AM EDT RP Workstation: [id number]    EKG/Medicine tests: Ordered and Independent interpretation EKG Interpretation: Sinus rhythm Left bundle branch block which has previously been demonstrated No significant change was found Confirmed by Rogelia Satterfield ([id number]) on 05/25/2024 9:16:35 AM  Interventions:LR bolus, Flexeril, Zofran , lidocaine  patch, home medications   See the EMR for full details regarding lab and imaging results.  Patient presents, well-appearing, no focal pain, just generalized weakness.  Patient did not have true fall, rather felt weak and sat down on the ground as it was the closest area, and was gently lowered to the ground by herself with aid from her husband, therefore do not feel that patient requires screening imaging.  Patient does feel that she may have strained her neck, and does have muscular tenderness to palpation along the left trapezius.  X-ray obtained of the left shoulder and does not reveal fracture or dislocation.  Patient given multimodal pain therapy with Flexeril and lidocaine  patch with complete resolution  of symptoms.  Was obtained and are overall reassuring, troponins mildly elevated, however flat/downtrending, patient also without chest pain and EKG reassuring, therefore do not suspect ACS.  UA without UTI.  No rhabdomyolysis with CK of 125.  Lactic acid WNL, therefore doubt sepsis, malperfusion.  Patient has mild nonspecific leukocytosis, however in the absence of other localizing symptoms, do not feel that patient requires further workup for this.  CMP reassuring.  Patient does have bilateral sinus pressure, however has no purulent discharge, no focal pain, no fever thus more likely viral rather than bacterial, given patient does not have symptoms suspicious for bacterial sinusitis, will not treat with antibiotics at this time.  Patient feeling much improved after multimodal pain therapy,  however on attempt to ambulate, patient unable to do so.  Reports that she has already intermittently been using a wheelchair at home, however is unable to get through their front door with a wheelchair or transfer in the bathroom, or get up and down the stairs.  Discussed the risks and benefits of TOC, and patient and husband would like for patient to have TOC evaluation.  Presentation is most consistent with acute complicated illness and I did consider and rule out acute life/limb-threatening illness  Discussion of management or test interpretations with external provider(s): None at the time of handoff  Risk Drugs:OTC drugs and Prescription drug management  Disposition: Placed in TOC boarder status  MDM generated using voice dictation software and may contain dictation errors.  Please contact me for any clarification or with any questions.  Clinical Impression:  1. Weakness      Data Unavailable   Final Clinical Impression(s) / ED Diagnoses Final diagnoses:  Weakness    Rx / DC Orders ED Discharge Orders     None        Rogelia Jerilynn RAMAN, MD 05/25/24 1559

## 2024-05-25 NOTE — Progress Notes (Signed)
 PT rec SNF. Pt faxed out. Bed offers pending.

## 2024-05-25 NOTE — Progress Notes (Signed)
 CSW spoke with pt's husband who expressed that he does not feel safe with patient returning home in her current state. CSW explained SNF workup process and informed she will notify him after PT completes their evaluation. He verbalized understanding. Following for PT eval.

## 2024-05-26 ENCOUNTER — Emergency Department (HOSPITAL_COMMUNITY)

## 2024-05-26 LAB — RESP PANEL BY RT-PCR (RSV, FLU A&B, COVID)  RVPGX2
Influenza A by PCR: NEGATIVE
Influenza B by PCR: NEGATIVE
Resp Syncytial Virus by PCR: NEGATIVE
SARS Coronavirus 2 by RT PCR: NEGATIVE

## 2024-05-26 MED ORDER — METHENAMINE MANDELATE 0.5 G PO TABS
1000.0000 mg | ORAL_TABLET | Freq: Two times a day (BID) | ORAL | Status: DC
  Administered 2024-05-26 – 2024-05-27 (×3): 1000 mg via ORAL
  Filled 2024-05-26 (×4): qty 2

## 2024-05-26 NOTE — Evaluation (Signed)
 Occupational Therapy Evaluation Patient Details Name: [name] MRN: [id number] DOB: [birth year] Today's Date: 05/26/2024   History of Present Illness   68 yo female presents to PT from ED following a assisted mechanical fall in home setting. Imaging thus far indicates no acute findings for L shoulder. Pt reports completing ABX for UTI on 10/26 and stating that she is having some sinus pressure and congestion. Pt PMH includes but is not limited to: arthritis, diverticulosis, GERD, HTN, MS relapsing-remitting, neurogenic bladder- self catheterizes, Sjogrens syndrome, L shoulder surgery, falls, recurrent UTIs, HLD chronic L shoulder, back and neck pain, and torn ligament in L knee.     Clinical Impressions Pt was seen for OT evaluation this date. Prior to hospital admission, pt was ambulating with use of rollator or conversion transport chair for in home mobility. Pt reports MODI with ADLs, supervision for bathing. Pt lives with her husband in one level house with 4 steps to enter. Pt presents with deficits in decreased Ind in self care,balance, functional mobility/transfers, activity tolerance, and safety awareness affecting safe and optimal ADL completion. Pt currently requires MODA for initial lift off from EOB, MINA for multiple other STS with verbal cues for hand placement. Pt ambulated within room with use of RW and close CGA, when returning to seated position pt reported feeling hot and light headed. One at rest pt began to feel better. BP sitting: 132/78, standing 100/70. Pt returned to supine with MINA for LE support. Pt would benefit from skilled OT services to address noted impairments and functional limitations (see below for any additional details) in order to maximize safety and independence while minimizing future risk of falls, injury, and readmission. Anticipate the need for follow up HHOT services upon acute hospital DC.   If plan is discharge home, recommend the  following:   A little help with walking and/or transfers;A little help with bathing/dressing/bathroom;Assistance with cooking/housework;Assist for transportation     Functional Status Assessment   Patient has had a recent decline in their functional status and demonstrates the ability to make significant improvements in function in a reasonable and predictable amount of time.     Equipment Recommendations   None recommended by OT      Precautions/Restrictions   Precautions Precautions: Fall (orthostatic hypotension) Recall of Precautions/Restrictions: Intact Restrictions Weight Bearing Restrictions Per Provider Order: No     Mobility Bed Mobility Overal bed mobility: Needs Assistance Bed Mobility: Supine to Sit, Sit to Supine     Supine to sit: HOB elevated, Used rails, Supervision Sit to supine: Mod assist   General bed mobility comments: MINA LE management when returning to supine    Transfers Overall transfer level: Needs assistance Equipment used: Rolling walker (2 wheels) Transfers: Sit to/from Stand Sit to Stand: Min assist, Mod assist           General transfer comment: Pt required MODA for lift off from low bed height with verbal cues for hand placement with DME management      Balance Overall balance assessment: History of Falls, Needs assistance Sitting-balance support: Feet supported Sitting balance-Leahy Scale: Good Sitting balance - Comments: Supervision for static sitting per dizziness hx   Standing balance support: Bilateral upper extremity supported, During functional activity, Reliant on assistive device for balance Standing balance-Leahy Scale: Fair Standing balance comment: Reliant on RW                           ADL either  performed or assessed with clinical judgement   ADL Overall ADL's : Needs assistance/impaired Eating/Feeding: Set up;Sitting   Grooming: Wash/dry face;Wash/dry hands;Set up;Sitting Grooming Details  (indicate cue type and reason): Seated face washing with set upA Upper Body Bathing: Set up;Sitting   Lower Body Bathing: Sit to/from stand;Minimal assistance   Upper Body Dressing : Sitting;Set up   Lower Body Dressing: Sit to/from stand;Minimal assistance   Toilet Transfer: Contact guard assist;Ambulation;Rolling walker (2 wheels);Regular Toilet;Cueing for safety;Cueing for sequencing   Toileting- Clothing Manipulation and Hygiene: Supervision/safety;Sitting/lateral lean       Functional mobility during ADLs: Contact guard assist;Rolling walker (2 wheels) General ADL Comments: Seated ADL tasks with setupA, MINA for LB dressing via figure 4 method     Vision Baseline Vision/History: 1 Wears glasses                         Pertinent Vitals/Pain Pain Assessment Pain Assessment: Faces Faces Pain Scale: Hurts even more Pain Location: chronic pain- L shoulder primary report Pain Descriptors / Indicators: Aching, Constant, Discomfort, Grimacing Pain Intervention(s): Monitored during session, Limited activity within patient's tolerance, Repositioned     Extremity/Trunk Assessment Upper Extremity Assessment Upper Extremity Assessment: Generalized weakness   Lower Extremity Assessment Lower Extremity Assessment: Defer to PT evaluation   Cervical / Trunk Assessment Cervical / Trunk Assessment: Normal   Communication Communication Communication: No apparent difficulties   Cognition Arousal: Alert Behavior During Therapy: WFL for tasks assessed/performed Cognition: No apparent impairments             OT - Cognition Comments: A/Ox4                 Following commands: Intact       Cueing   Cueing Techniques: Verbal cues      Exercises Exercises: Other exercises Other Exercises Other Exercises: Edu: Role of OT, safe ADL completion, fall prevention techniques, DME use         Home Living Family/patient expects to be discharged to:: Private  residence Living Arrangements: Spouse/significant other Available Help at Discharge: Family Type of Home: House Home Access: Stairs to enter Secretary/administrator of Steps: 4 Entrance Stairs-Rails: Right;Left Home Layout: One level     Bathroom Shower/Tub: Chief Strategy Officer: Standard Bathroom Accessibility: No   Home Equipment: Agricultural Consultant (2 wheels);Rollator (4 wheels);Cane - single point;Transport chair          Prior Functioning/Environment Prior Level of Function : Needs assist       Physical Assist : Mobility (physical);ADLs (physical) Mobility (physical): Bed mobility;Transfers;Gait;Stairs ADLs (physical): Bathing;IADLs Mobility Comments: pt indicates she has been using rollator/conversion transport chair for mobility in home, however does not fit through doorway to bathrom and pt then is required to use rollator, pt has hx of falls, spouse assist with step navigation, occationally with gait tasks and B LE in/out of bed ADLs Comments: pt states she requires A for lower body dressing tasks and for bathing, as well as IADLs, pt is able to self cath. Husband provides meals    OT Problem List: Decreased strength;Decreased activity tolerance;Impaired balance (sitting and/or standing);Decreased coordination;Decreased safety awareness;Decreased knowledge of use of DME or AE;Decreased knowledge of precautions   OT Treatment/Interventions: Therapeutic exercise;Self-care/ADL training;Energy conservation;Therapeutic activities;Balance training;Patient/family education      OT Goals(Current goals can be found in the care plan section)   Acute Rehab OT Goals Patient Stated Goal: return home OT Goal Formulation: With patient Time  For Goal Achievement: 06/09/24 Potential to Achieve Goals: Good ADL Goals Pt Will Perform Grooming: with supervision;standing Pt Will Perform Lower Body Dressing: sit to/from stand;with modified independence Pt Will Transfer to  Toilet: ambulating;with supervision Pt Will Perform Toileting - Clothing Manipulation and hygiene: sit to/from stand;with modified independence   OT Frequency:  Min 2X/week    Co-evaluation              AM-PAC OT 6 Clicks Daily Activity     Outcome Measure Help from another person eating meals?: None Help from another person taking care of personal grooming?: A Little Help from another person toileting, which includes using toliet, bedpan, or urinal?: A Little Help from another person bathing (including washing, rinsing, drying)?: A Little Help from another person to put on and taking off regular upper body clothing?: A Little Help from another person to put on and taking off regular lower body clothing?: A Little 6 Click Score: 19   End of Session Equipment Utilized During Treatment: Gait belt;Rolling walker (2 wheels) Nurse Communication: Mobility status;Other (comment) (Orthostatic vitals)  Activity Tolerance: Patient tolerated treatment well Patient left: in bed;with call bell/phone within reach;with bed alarm set  OT Visit Diagnosis: Unsteadiness on feet (R26.81);Other abnormalities of gait and mobility (R26.89);Muscle weakness (generalized) (M62.81)                Time: 1413-1430 OT Time Calculation (min): 17 min Charges:  OT General Charges $OT Visit: 1 Visit OT Evaluation $OT Eval Moderate Complexity: 1 Mod  Crytal Pensinger M.S. OTR/L  05/26/24, 3:39 PM

## 2024-05-26 NOTE — Progress Notes (Addendum)
 CSW presented bed offers to pt's spouse who will review Medicare.gov and speak with pt to decide on a facility.   Addend @ 11:33AM Spouse requested [facility]. CSW outreached to [place]. Awaiting response.

## 2024-05-26 NOTE — ED Notes (Signed)
 Patient assisted to restroom and she successfully self catherized.

## 2024-05-26 NOTE — Progress Notes (Signed)
 [facility] offered bed. Pt and spouse accepted. Auth pending for [facility].

## 2024-05-26 NOTE — ED Provider Notes (Signed)
 Emergency Medicine Observation Re-evaluation Note  [name] is a 68 y.o. female, seen on rounds today.  Pt initially presented to the ED for complaints of Fall and Weakness Currently, the patient is asleep.  Physical Exam  BP 105/70 (BP Location: Left Arm)   Pulse 77   Temp 97.6 F (36.4 C) (Oral)   Resp 14   SpO2 95%  Physical Exam General: asleep Cardiac: asleep Lungs: asleep Psych: asleep  ED Course / MDM  EKG:EKG Interpretation Date/Time:  Wednesday May 25 2024 07:51:52 EDT Ventricular Rate:  89 PR Interval:  144 QRS Duration:  130 QT Interval:  421 QTC Calculation: 510 R Axis:   32  Text Interpretation: Sinus rhythm Left bundle branch block which has previously been demonstrated No significant change was found Confirmed by Rogelia Satterfield ([id number]) on 05/25/2024 9:16:35 AM  I have reviewed the labs performed to date as well as medications administered while in observation.  Recent changes in the last 24 hours include ordering her home meds.  Plan  Current plan is for placement.    Freddi Hamilton, MD 05/26/24 [phone]

## 2024-05-27 LAB — URINE CULTURE: Culture: <10000 — AB

## 2024-05-27 MED ORDER — GUAIFENESIN 100 MG/5ML PO LIQD
5.0000 mL | Freq: Once | ORAL | Status: AC
  Administered 2024-05-27: 5 mL via ORAL
  Filled 2024-05-27: qty 5

## 2024-05-27 NOTE — Discharge Instructions (Addendum)
 [name] was seen in the emergency room for generalized weakness.  Patient has history pertinent for multiple sclerosis, osteoporosis, recurrent UTI, GERD, hypertriglyceridemia, constipation, hypertension, Sjogren's disease, neurogenic bladder.   [name] had workup in the emergency room that comprised of CBC, metabolic profile, urine analysis which is overall reassuring.  [name] has been evaluated by physical therapy prior to being deemed stable for transfer to facility.

## 2024-05-27 NOTE — Progress Notes (Addendum)
 Auth still pending.   Addend @ 12:00PM CSW received call from [employer] that [facility] is OON. CSW spoke with spouse who would like to private pay for [facility]. Whitney aware and will speak with him. CSW awaiting update on admission time.

## 2024-05-27 NOTE — ED Provider Notes (Signed)
 Emergency Medicine Observation Re-evaluation Note  [name] is a 68 y.o. female, seen on rounds today.  Pt initially presented to the ED for complaints of Fall and Weakness Currently, the patient is asleep.  Patient has history pertinent for multiple sclerosis, osteoporosis, recurrent UTI, GERD, hypertriglyceridemia, constipation, hypertension, Sjogren's disease, neurogenic bladder.  Patient and family not comfortable with her going home.  Medical workup reassuring.  Social work consulted for placement.  Physical Exam  BP 131/80 (BP Location: Right Arm)   Pulse 91   Temp 98.2 F (36.8 C) (Oral)   Resp 18   SpO2 95%  Physical Exam General: No acute distress Cardiac: Regular rate Lungs: No respiratory distress Psych: Currently calm  ED Course / MDM  EKG:EKG Interpretation Date/Time:  Wednesday May 25 2024 07:51:52 EDT Ventricular Rate:  89 PR Interval:  144 QRS Duration:  130 QT Interval:  421 QTC Calculation: 510 R Axis:   32  Text Interpretation: Sinus rhythm Left bundle branch block which has previously been demonstrated No significant change was found Confirmed by Rogelia Satterfield ([id number]) on 05/25/2024 9:16:35 AM  I have reviewed the labs performed to date as well as medications administered while in observation.  Recent changes in the last 24 hours include -no new changes.  Plan  Current plan is for holding patient for placement.    Charlyn Sora, MD 05/27/24 [phone]

## 2024-06-03 ENCOUNTER — Other Ambulatory Visit: Payer: Self-pay | Admitting: Internal Medicine

## 2024-06-08 ENCOUNTER — Ambulatory Visit: Admitting: Internal Medicine

## 2024-06-08 VITALS — BP 136/88 | HR 81 | Temp 97.5°F | Ht 65.0 in | Wt 150.0 lb

## 2024-06-08 DIAGNOSIS — G35D Multiple sclerosis, unspecified: Secondary | ICD-10-CM

## 2024-06-08 DIAGNOSIS — R7989 Other specified abnormal findings of blood chemistry: Secondary | ICD-10-CM | POA: Diagnosis not present

## 2024-06-08 DIAGNOSIS — N390 Urinary tract infection, site not specified: Secondary | ICD-10-CM | POA: Diagnosis not present

## 2024-06-08 DIAGNOSIS — R32 Unspecified urinary incontinence: Secondary | ICD-10-CM

## 2024-06-08 NOTE — Patient Instructions (Addendum)
 Will ok PT  when asked . Advise a  protochol   at the urology  team. As discussed

## 2024-06-09 ENCOUNTER — Other Ambulatory Visit: Payer: Self-pay | Admitting: Internal Medicine

## 2024-06-09 ENCOUNTER — Telehealth: Payer: Self-pay | Admitting: *Deleted

## 2024-06-09 ENCOUNTER — Encounter: Payer: Self-pay | Admitting: Internal Medicine

## 2024-06-09 DIAGNOSIS — R7989 Other specified abnormal findings of blood chemistry: Secondary | ICD-10-CM

## 2024-06-09 NOTE — Progress Notes (Signed)
 Chief Complaint  Patient presents with   Medical Management of Chronic Issues    pt is here to f/u from ED visit. had MS and collapse. Pt states she wants to discus what can we can proactive do prevent this from happening again.     HPI: [name] 68 y.o. come in for fu Ed visit for weakness spell    that was probably triggered by acute resp infection  and  UTI  She has been having more utis and in a row  keflex  hast worked recently  . Difficulty getting  rapid response when contacts urology office to get  urine and cultures as indicated done She feels is back to baseline but concerned will keep happening  presents  a abstracted info summary of issues and care.   She has had some recal incontinence  and was to have PT but hasn't done yet . Not advised urology  area  ROS: See pertinent positives and negatives per HPI. No current fever resp sx  uses walker assisted  ambulation . Self caths   Past Medical History:  Diagnosis Date   Ambulates with cane 04/08/2021   prn occ walker prn   Arthritis    left knee   Chronic kidney disease    Diverticulosis of colon    GERD (gastroesophageal reflux disease)    History of concussion 2019   no residual   History of kidney stones    Hypertension    Multiple sclerosis, relapsing-remitting 2007   neurologist--- dr sharri magyar ([facility] [place]   Neurogenic bladder    Neuromuscular disorder (HCC)    has MS, Sjogren's   Self-catheterizes urinary bladder 04/08/2021   self caths 3 to 4 x daily   Sjogren's syndrome 10/23/2021   runs vaporizer in room @ night to help with   Torn ligament 04/01/2022   left knee   Urge incontinence    Uses walker 04/08/2021   prn   Varicose veins of both lower extremities     Family History  Problem Relation Age of Onset   Lung cancer Father    Colon cancer Neg Hx     Social History   Socioeconomic History   Marital status: Married    Spouse name: Not on file   Number  of children: 4   Years of education: Not on file   Highest education level: Bachelor's degree (e.g., BA, AB, BS)  Occupational History   Occupation: Disability  Tobacco Use   Smoking status: Never    Passive exposure: Never   Smokeless tobacco: Never  Vaping Use   Vaping status: Never Used  Substance and Sexual Activity   Alcohol use: Yes    Comment: 2 glasses wine per day   Drug use: No   Sexual activity: Not Currently  Other Topics Concern   Not on file  Social History Narrative   Unable to volunteer    Now on disability   Children now out of home   Daily caffeine    Social Drivers of Health   Financial Resource Strain: Low Risk  (05/24/2024)   Overall Financial Resource Strain (CARDIA)    Difficulty of Paying Living Expenses: Not hard at all  Food Insecurity: No Food Insecurity (05/24/2024)   Hunger Vital Sign    Worried About Running Out of Food in the Last Year: Never true    Ran Out of Food in the Last Year: Never true  Transportation Needs: No Transportation  Needs (05/24/2024)   PRAPARE - Administrator, Civil Service (Medical): No    Lack of Transportation (Non-Medical): No  Physical Activity: Inactive (05/24/2024)   Exercise Vital Sign    Days of Exercise per Week: 0 days    Minutes of Exercise per Session: Not on file  Stress: No Stress Concern Present (05/24/2024)   [employer] of Occupational Health - Occupational Stress Questionnaire    Feeling of Stress: Not at all  Social Connections: Socially Integrated (05/24/2024)   Social Connection and Isolation Panel    Frequency of Communication with Friends and Family: Three times a week    Frequency of Social Gatherings with Friends and Family: Once a week    Attends Religious Services: More than 4 times per year    Active Member of [employer] or Organizations: Yes    Attends Engineer, Structural: More than 4 times per year    Marital Status: Married    Outpatient Medications Prior to  Visit  Medication Sig Dispense Refill   amLODipine  (NORVASC ) 2.5 MG tablet Take 1 tablet (2.5 mg total) by mouth daily. 90 tablet 3   ascorbic acid (VITAMIN C) 500 MG tablet Take 500 mg by mouth daily.     Biotin 89999 MCG TABS Take 10 mg by mouth daily.     Calcium Citrate-Vitamin D  (CALCIUM CITRATE+D3 PO) Take 1 tablet by mouth daily.     Cholecalciferol (VITAMIN D3) 50 MCG (2000 UT) capsule Take 2,000 Units by mouth daily.     CRANBERRY PO Take 500 mg by mouth in the morning and at bedtime.     D-MANNOSE PO Take 1 capsule by mouth in the morning and at bedtime.     DULoxetine (CYMBALTA) 60 MG capsule Take 60 mg by mouth in the morning and at bedtime.     esomeprazole  (NEXIUM  24HR) 20 MG capsule Take 20 mg by mouth daily before breakfast.     fluticasone (FLONASE) 50 MCG/ACT nasal spray Place 1 spray into both nostrils daily as needed for allergies.     gabapentin (NEURONTIN) 300 MG capsule Take 600 mg by mouth at bedtime.     HYDROcodone -acetaminophen  (NORCO/VICODIN) 5-325 MG tablet Take 1 tablet by mouth every 6 (six) hours as needed for moderate pain (pain score 4-6).     ibuprofen  (ADVIL ) 200 MG tablet Take 600 mg by mouth every 8 (eight) hours as needed (for pain or headaches).     losartan  (COZAAR ) 25 MG tablet TAKE 1 TABLET BY MOUTH DAILY FOR HIGH BLOOD PRESSURE INSTEAD OF VALSARTAN  90 tablet 1   meloxicam  (MOBIC ) 15 MG tablet Take 15 mg by mouth daily.     Meth-Hyo-M Bl-Na Phos-Ph Sal (URIBEL) 118 MG CAPS Take 118 mg by mouth daily as needed (bladder spasms). Using PRN     methenamine (MANDELAMINE) 1 g tablet Take 1,000 mg by mouth 2 (two) times daily.     OPTASE DRY EYE INTENSE 0.2 % SOLN Place 1 drop into both eyes in the morning and at bedtime.     Probiotic Product (PROBIOTIC PEARLS) CAPS Take 1 capsule by mouth in the morning and at bedtime.     Teriflunomide (AUBAGIO) 14 MG TABS Take 14 mg by mouth daily.     zoledronic  acid (RECLAST ) 5 MG/100ML SOLN injection Inject 5 mg into  the vein See admin instructions. 1 time a year - every June     ALKA-SELTZER PLUS COLD & FLU 11-26-08-250 MG TBEF Take 1 tablet  by mouth every 6 (six) hours as needed (for cold-like symptoms- dissolve in water ).     No facility-administered medications prior to visit.     EXAM:  BP 136/88 (BP Location: Left Arm, Patient Position: Sitting, Cuff Size: Normal)   Pulse 81   Temp (!) 97.5 F (36.4 C) (Oral)   Ht 5' 5 (1.651 m)   Wt 150 lb (68 kg)   SpO2 99%   BMI 24.96 kg/m   Body mass index is 24.96 kg/m.  GENERAL: vitals reviewed and listed above, alert, oriented, appears well hydrated and in no acute distress HEENT: atraumatic, conjunctiva  clear, no obvious abnormalities on inspection of external nose and ears  NECK: no obvious masses on inspection palpation  CV: HRRR, no clubbing cyanosis or  peripheral edema nl cap refill  MS: moves all extremities ambulation with walker  PSYCH: pleasant and cooperative, no obvious depression or anxiety nl speech and thought   BP Readings from Last 3 Encounters:  06/08/24 136/88  05/27/24 (!) 147/85  03/16/24 (!) 114/97  ED visit reviewed   ASSESSMENT AND PLAN:  Discussed the following assessment and plan:  Recurrent UTI  Multiple sclerosis  Incontinence in female - fecal and urine neurogenic  Abnormal LFTs  Appears that infections/ illness exacerbate  her MS and  cause complicated sx . Reviewed  recurrent utis and  possible  facilitations of orders for care . Suggest see her urology team  (Dr Devere an O'Berry  )and maybe a protocol when she thinks has uti so no delay in obtaining information urine culture and  antibiotic if needed.  Infections for her cause much more debility than a non MS patient . Suggest look  back into  pelvic floor exercise that may be helpful.  After reviewed   noted  loose end of abnormal lfts when went to hospital  could have been from illness meds etc  Will need fu .  Labs  in a few weeks  . ( Lfts  fu)  -Patient advised to return or notify health care team  if  new concerns arise.  Patient Instructions  Will ok PT  when asked . Advise a  protochol   at the urology  team. As discussed      Apolinar POUR. Adalia Pettis M.D.

## 2024-06-09 NOTE — Telephone Encounter (Signed)
 Spoke to Liji. Inform her of provider's approval. No further action is needed.

## 2024-06-09 NOTE — Telephone Encounter (Signed)
 Yes please approve   dx  hx weakness  episode  , Multiple Sclerosis

## 2024-06-09 NOTE — Telephone Encounter (Signed)
 Copied from CRM [id number]. Topic: Clinical - Home Health Verbal Orders >> Jun 08, 2024  4:10 PM Rea BROCKS wrote: Caller/Agency: [facility]  Callback Number: [phone] Service Requested: Physical Therapy  Frequency: 1 week 1  2x for 3 week  1x for 3 week  Any new concerns about the patient?

## 2024-06-09 NOTE — Progress Notes (Signed)
 Last vitamin D  Lab Results  Component Value Date   VD25OH 82.38 04/24/2021   Future labs

## 2024-06-10 ENCOUNTER — Telehealth: Payer: Self-pay

## 2024-06-10 NOTE — Telephone Encounter (Signed)
 Spoke to pt. Pt reports she had spoke to someone regarding to abnormal liver test and she has appt in December for lab. No further action is needed.

## 2024-06-10 NOTE — Telephone Encounter (Signed)
 Attempted to call pt. Left a voicemail to call us  back.

## 2024-06-10 NOTE — Telephone Encounter (Signed)
-----   Message from Apolinar Eastern sent at 06/09/2024  5:20 PM EST ----- [name]  please let patient know that I noted that her liver tests were abnormal in the ed . I ordered future liver follow up  labs to be done a month after the last labs ( dont have to fast)    can make a lab appt  in December

## 2024-06-21 ENCOUNTER — Telehealth: Payer: Self-pay

## 2024-06-21 NOTE — Telephone Encounter (Signed)
 Received an urgent sign order from [facility].  Service for PT  Finding: Losartan  25mg  tab interacts with Trimethoprim  100mg .    Please advise.

## 2024-06-21 NOTE — Telephone Encounter (Signed)
 I dont see tmp on her med list .  Please advise that if given med should  get prescriber to address problem interaction.

## 2024-06-22 NOTE — Telephone Encounter (Signed)
 Contacted [facility] and spoke to a representative. She inform that I need to speak to someone who is clinical, Shona. She continues that Shona is in a meeting and will call this CMA back.

## 2024-06-27 NOTE — Telephone Encounter (Signed)
 Spoke to Union last week and inform her of information below. Pt states she will note it and will fax it to the correct provider.

## 2024-06-28 ENCOUNTER — Other Ambulatory Visit

## 2024-06-28 DIAGNOSIS — R7989 Other specified abnormal findings of blood chemistry: Secondary | ICD-10-CM | POA: Diagnosis not present

## 2024-06-29 LAB — HEPATIC FUNCTION PANEL
ALT: 76 U/L — ABNORMAL HIGH (ref 0–35)
AST: 54 U/L — ABNORMAL HIGH (ref 0–37)
Albumin: 4.4 g/dL (ref 3.5–5.2)
Alkaline Phosphatase: 103 U/L (ref 39–117)
Bilirubin, Direct: 0.1 mg/dL (ref 0.0–0.3)
Total Bilirubin: 0.4 mg/dL (ref 0.2–1.2)
Total Protein: 6.6 g/dL (ref 6.0–8.3)

## 2024-06-29 LAB — HEPATITIS B SURFACE ANTIBODY,QUALITATIVE: Hep B S Ab: NONREACTIVE

## 2024-06-29 LAB — HEPATITIS B CORE ANTIBODY, TOTAL: Hep B Core Total Ab: NONREACTIVE

## 2024-06-29 LAB — HEPATITIS B SURFACE ANTIGEN: Hepatitis B Surface Ag: NONREACTIVE

## 2024-06-29 LAB — GAMMA GT: GGT: 200 U/L — ABNORMAL HIGH (ref 7–51)

## 2024-06-29 LAB — HEPATITIS C ANTIBODY: Hepatitis C Ab: NONREACTIVE

## 2024-07-06 ENCOUNTER — Telehealth: Payer: Self-pay | Admitting: *Deleted

## 2024-07-06 NOTE — Telephone Encounter (Signed)
 Copied from CRM #[id number]. Topic: Clinical - Lab/Test Results >> Jul 06, 2024  9:29 AM Aleatha BROCKS wrote: Reason for CRM: Patient would like a callback regarding abnormal lab results

## 2024-07-13 ENCOUNTER — Telehealth: Admitting: Internal Medicine

## 2024-07-13 ENCOUNTER — Encounter: Payer: Self-pay | Admitting: Internal Medicine

## 2024-07-13 VITALS — Ht 65.0 in | Wt 150.0 lb

## 2024-07-13 DIAGNOSIS — I7300 Raynaud's syndrome without gangrene: Secondary | ICD-10-CM

## 2024-07-13 DIAGNOSIS — R748 Abnormal levels of other serum enzymes: Secondary | ICD-10-CM

## 2024-07-13 DIAGNOSIS — R7401 Elevation of levels of liver transaminase levels: Secondary | ICD-10-CM

## 2024-07-13 DIAGNOSIS — Z79899 Other long term (current) drug therapy: Secondary | ICD-10-CM | POA: Diagnosis not present

## 2024-07-13 DIAGNOSIS — N390 Urinary tract infection, site not specified: Secondary | ICD-10-CM | POA: Diagnosis not present

## 2024-07-13 NOTE — Progress Notes (Signed)
 Virtual Visit via Video Note  I connected with [name] on 07/13/2024 at  9:15 AM EST by a video enabled telemedicine application and verified that I am speaking with the correct person using two identifiers. Location patient: home Location provider:work office Persons participating in the virtual visit: patient, provider   Patient aware  of the limitations of evaluation and management by telemedicine and  availability of in person appointments. and agreed to proceed.   HPI: [name] presents for video visit  fu  abnormal lfts   seen at ed visit and fu  for episode of weakness feeling and dx uti .  She is now on Macrobid   for acute uti  dr Devere and to go on phyaxis  No fever  sig gi s except the bowel bladder inc she has prev described  Has fu Dr marykay  Jan 26 . No fever jaundice vomiting   No other new medication.  Still has gall bladder  Takes ocass tylenol      ROS: See pertinent positives and negatives per HPI. Hx of raynauds  as in family    Past Medical History:  Diagnosis Date   Ambulates with cane 04/08/2021   prn occ walker prn   Arthritis    left knee   Chronic kidney disease    Diverticulosis of colon    GERD (gastroesophageal reflux disease)    History of concussion 2019   no residual   History of kidney stones    Hypertension    Multiple sclerosis, relapsing-remitting 2007   neurologist--- dr sharri magyar ([facility]-- [place])   Neurogenic bladder    Neuromuscular disorder (HCC)    has MS, Sjogren's   Self-catheterizes urinary bladder 04/08/2021   self caths 3 to 4 x daily   Sjogren's syndrome 10/23/2021   runs vaporizer in room @ night to help with   Torn ligament 04/01/2022   left knee   Urge incontinence    Uses walker 04/08/2021   prn   Varicose veins of both lower extremities     Past Surgical History:  Procedure Laterality Date   BOTOX  INJECTION N/A 04/10/2021   Procedure: BOTOX  INJECTION WITH  CYSTOSCOPY;  Surgeon: Devere Lonni Righter, MD;  Location: [facility];  Service: Urology;  Laterality: N/A;   BOTOX  INJECTION N/A 10/25/2021   Procedure: CYSTOSCOPY WITH BOTOX  INJECTION 300 UNITS;  Surgeon: Devere Lonni Righter, MD;  Location: [facility];  Service: Urology;  Laterality: N/A;   BOTOX  INJECTION N/A 04/09/2022   Procedure: BOTOX  INJECTION 300 units WITH CYSTOSCOPY;  Surgeon: Devere Lonni Righter, MD;  Location: [facility];  Service: Urology;  Laterality: N/A;   BOTOX  INJECTION N/A 10/15/2022   Procedure: BOTOX   300 UNITSINJECTION;  Surgeon: Devere Lonni Righter, MD;  Location: [facility];  Service: Urology;  Laterality: N/A;   CATARACT EXTRACTION W/ INTRAOCULAR LENS IMPLANT Left 2022   COLONOSCOPY WITH PROPOFOL   01/10/2015   dr d. brodie   CYSTOSCOPY WITH INJECTION N/A 10/15/2022   Procedure: CYSTOSCOPY WITH INJECTION;  Surgeon: Devere Lonni Righter, MD;  Location: [facility];  Service: Urology;  Laterality: N/A;  30 MINS   CYSTOSCOPY WITH INJECTION N/A 04/24/2023   Procedure: CYSTOSCOPY WITH BOTOX  INJECTION 300 UNITS;  Surgeon: Devere Lonni Righter, MD;  Location: [facility];  Service: Urology;  Laterality: N/A;   CYSTOSCOPY WITH INJECTION N/A 10/14/2023   Procedure: CYSTOSCOPY, WITH INJECTION OF BLADDER NECK  OR BLADDER WALL;  Surgeon: Devere Lonni Righter, MD;  Location: [facility];  Service: Urology;  Laterality: N/A;  CYSTOSCOPY WITH BOTOX  INJECTIONS (300 UNITS)   CYSTOSCOPY WITH INJECTION N/A 03/16/2024   Procedure: CYSTOSCOPY, WITH INJECTION OF BLADDER WALL;  Surgeon: Devere Lonni Righter, MD;  Location: [facility];  Service: Urology;  Laterality: N/A;  CYSTOSCOPY WITH BOTOX  INJECTIONS (300 UNITS)   SHOULDER ARTHROSCOPY WITH ROTATOR CUFF REPAIR AND SUBACROMIAL DECOMPRESSION  06/01/2012   Procedure: SHOULDER ARTHROSCOPY WITH ROTATOR CUFF REPAIR AND  SUBACROMIAL DECOMPRESSION;  Surgeon: Lynwood SHAUNNA Bern, MD;  Location: [facility];  Service: Orthopedics;  Laterality: Left;  LEFT SHOULDER ARTHROSCOPY WITH SUBACROMIAL DECOMPRESION AND DEBRIDEMENT OF PARTIAL ROTATOR CUFF TEAR   TONSILLECTOMY AND ADENOIDECTOMY  1962   TOTAL VAGINAL HYSTERECTOMY  12/01/2006   @[facility]  by dr m. mat;   W/ ANTERIOR & POSTERIOR REPAIR AND SACROPINOUS LIGAMENT FIXATION OF VAGINAL VAULT    Family History  Problem Relation Age of Onset   Lung cancer Father    Colon cancer Neg Hx     Social History[1]   Current Medications[2]  EXAM: BP Readings from Last 3 Encounters:  06/08/24 136/88  05/27/24 (!) 147/85  03/16/24 (!) 114/97    VITALS per patient if applicable:  GENERAL: alert, oriented, appears well and in no acute distress non icteric   HEENT: atraumatic, conjunttiva clear, no obvious abnormalities on inspection of external nose and ears  NECK: normal movements of the head and neck  LUNGS: on inspection no signs of respiratory distress, breathing rate appears normal, no obvious gross SOB, gasping or wheezing  CV: no obvious cyanosis  PSYCH/NEURO: pleasant and cooperative, no obvious depression or anxiety, speech and thought processing grossly intact Lab Results  Component Value Date   WBC 12.4 (H) 05/25/2024   HGB 14.1 05/25/2024   HCT 42.4 05/25/2024   PLT 245 05/25/2024   GLUCOSE 109 (H) 05/25/2024   CHOL 172 04/24/2021   TRIG 112.0 04/24/2021   HDL 55.80 04/24/2021   LDLDIRECT 110.0 04/23/2015   LDLCALC 94 04/24/2021   ALT 76 (H) 06/28/2024   AST 54 (H) 06/28/2024   NA 139 05/25/2024   K 3.5 05/25/2024   CL 100 05/25/2024   CREATININE 0.64 05/25/2024   BUN 10 05/25/2024   CO2 25 05/25/2024   TSH 1.12 04/24/2021   HGBA1C 5.2 02/13/2014    ASSESSMENT AND PLAN:  Discussed the following assessment and plan:    ICD-10-CM   1. Elevated liver transaminase level  R74.01 US  Abdomen Limited RUQ (LIVER/GB)    Hepatic  function panel    Lactate Dehydrogenase    Gamma GT    Mitochondrial antibodies    ANA,IFA RA Diag Pnl w/rflx Tit/Patn    ANTI-LIVER/KIDNEY AB (RDL)    IBC panel    C-reactive protein    2. Elevated serum GGT level  R74.8 US  Abdomen Limited RUQ (LIVER/GB)    Hepatic function panel    Lactate Dehydrogenase    Gamma GT    Mitochondrial antibodies    ANA,IFA RA Diag Pnl w/rflx Tit/Patn    ANTI-LIVER/KIDNEY AB (RDL)    IBC panel    C-reactive protein    3. Recurrent UTI  N39.0     4. Raynaud's phenomenon without gangrene  I73.00 Hepatic function panel    Lactate Dehydrogenase    Gamma GT    Mitochondrial antibodies    ANA,IFA RA Diag Pnl w/rflx Tit/Patn    ANTI-LIVER/KIDNEY AB (RDL)  IBC panel    C-reactive protein   reported in record pos ana  no dx autoimmune    5. Medication management  Z79.899      Transaminitis  uncertain cause   ggt also elevated and no sx of biliary disease On a number of meds and  rx for utis  . Uncertain if  med re;ated   Doesn't  take tylenol  every day  On cymbalta  max dose for  a long time for ? MS sx  or migraines  She does have a hx of what sounds like raynauds that runs in family  so check for [employer] serologist   Dont  see that the bowel bladder  inc is directly related at this time. Will plan liver US   and fu labs in 4-6 weeks  to include lfts ana ama abys ggt etc  If  persistent or progressive we will have her gi team evaluated  dr Cherylle Balder  Counseled.   Expectant management and discussion of plan and treatment with opportunity to ask questions and all were answered. The patient agreed with the plan and demonstrated an understanding of the instructions.   Advised to call back or seek an in-person evaluation if worsening  or having  further concerns  in interim. Return for lab in 4-6 weeks  .   Apolinar Eastern, MD      [1]  Social History Tobacco Use   Smoking status: Never    Passive exposure: Never   Smokeless tobacco:  Never  Vaping Use   Vaping status: Never Used  Substance Use Topics   Alcohol use: Yes    Comment: 2 glasses wine per day   Drug use: No  [2]  Current Outpatient Medications:    amLODipine  (NORVASC ) 2.5 MG tablet, Take 1 tablet (2.5 mg total) by mouth daily., Disp: 90 tablet, Rfl: 3   ascorbic acid  (VITAMIN C ) 500 MG tablet, Take 500 mg by mouth daily., Disp: , Rfl:    Biotin  10000 MCG TABS, Take 10 mg by mouth daily., Disp: , Rfl:    Calcium  Citrate-Vitamin D  (CALCIUM  CITRATE+D3 PO), Take 1 tablet by mouth daily., Disp: , Rfl:    Cholecalciferol  (VITAMIN D3) 50 MCG (2000 UT) capsule, Take 2,000 Units by mouth daily., Disp: , Rfl:    CRANBERRY PO, Take 500 mg by mouth in the morning and at bedtime., Disp: , Rfl:    D-MANNOSE PO, Take 1 capsule by mouth in the morning and at bedtime., Disp: , Rfl:    DULoxetine  (CYMBALTA ) 60 MG capsule, Take 60 mg by mouth in the morning and at bedtime., Disp: , Rfl:    esomeprazole  (NEXIUM  24HR) 20 MG capsule, Take 20 mg by mouth daily before breakfast., Disp: , Rfl:    fluticasone  (FLONASE ) 50 MCG/ACT nasal spray, Place 1 spray into both nostrils daily as needed for allergies., Disp: , Rfl:    gabapentin  (NEURONTIN ) 300 MG capsule, Take 600 mg by mouth at bedtime., Disp: , Rfl:    HYDROcodone -acetaminophen  (NORCO/VICODIN) 5-325 MG tablet, Take 1 tablet by mouth every 6 (six) hours as needed for moderate pain (pain score 4-6)., Disp: , Rfl:    ibuprofen  (ADVIL ) 200 MG tablet, Take 600 mg by mouth every 8 (eight) hours as needed (for pain or headaches)., Disp: , Rfl:    losartan  (COZAAR ) 25 MG tablet, TAKE 1 TABLET BY MOUTH DAILY FOR HIGH BLOOD PRESSURE INSTEAD OF VALSARTAN , Disp: 90 tablet, Rfl: 1   meloxicam  (MOBIC ) 15 MG  tablet, Take 15 mg by mouth daily., Disp: , Rfl:    Meth-Hyo-M Bl-Na Phos-Ph Sal (URIBEL) 118 MG CAPS, Take 118 mg by mouth daily as needed (bladder spasms). Using PRN, Disp: , Rfl:    methenamine  (MANDELAMINE) 1 g tablet, Take 1,000 mg by  mouth 2 (two) times daily., Disp: , Rfl:    nitrofurantoin , macrocrystal-monohydrate, (MACROBID ) 100 MG capsule, Take 100 mg by mouth 2 (two) times daily. Just finish twice a day, tomorrow will start once a day., Disp: , Rfl:    OPTASE DRY EYE INTENSE 0.2 % SOLN, Place 1 drop into both eyes in the morning and at bedtime., Disp: , Rfl:    Probiotic Product (PROBIOTIC PEARLS) CAPS, Take 1 capsule by mouth in the morning and at bedtime., Disp: , Rfl:    Teriflunomide (AUBAGIO) 14 MG TABS, Take 14 mg by mouth daily., Disp: , Rfl:    zoledronic  acid (RECLAST ) 5 MG/100ML SOLN injection, Inject 5 mg into the vein See admin instructions. 1 time a year - every June, Disp: , Rfl:    ALKA-SELTZER PLUS COLD & FLU 11-26-08-250 MG TBEF, Take 1 tablet by mouth every 6 (six) hours as needed (for cold-like symptoms- dissolve in water ). (Patient not taking: Reported on 07/13/2024), Disp: , Rfl:

## 2024-07-13 NOTE — Telephone Encounter (Signed)
 Had visit today

## 2024-07-26 ENCOUNTER — Ambulatory Visit
Admission: RE | Admit: 2024-07-26 | Discharge: 2024-07-26 | Disposition: A | Discharge status: Home / Self Care | Source: Ambulatory Visit | Attending: Internal Medicine | Admitting: Internal Medicine

## 2024-07-26 DIAGNOSIS — R7401 Elevation of levels of liver transaminase levels: Secondary | ICD-10-CM

## 2024-07-26 DIAGNOSIS — R748 Abnormal levels of other serum enzymes: Secondary | ICD-10-CM

## 2024-08-01 DIAGNOSIS — R1314 Dysphagia, pharyngoesophageal phase: Secondary | ICD-10-CM

## 2024-08-01 DIAGNOSIS — R32 Unspecified urinary incontinence: Secondary | ICD-10-CM

## 2024-08-01 DIAGNOSIS — K219 Gastro-esophageal reflux disease without esophagitis: Secondary | ICD-10-CM

## 2024-08-01 NOTE — Progress Notes (Signed)
 Some gallstones  a cyst    no obstruction  I want you to see your Gi team . Also I have concern  about aubagio that is reported  to  cause  a severe liver reaction .  I dont know if that is the problem  but I want you to see  your  GI team and have   info sent to neurology  as to advisability of continuing the aubagio .  Karpiuh please send this information to her Gi and Neurology team

## 2024-08-02 NOTE — Progress Notes (Signed)
 Referral is placed.
